# Patient Record
Sex: Male | Born: 1950 | Race: White | Hispanic: No | Smoking: Current every day smoker
Health system: Southern US, Community
[De-identification: ages and names within clinical notes are randomized; demographics above are authoritative.]

## PROBLEM LIST (undated history)

## (undated) DIAGNOSIS — C61 Malignant neoplasm of prostate: Secondary | ICD-10-CM

## (undated) DIAGNOSIS — R519 Headache, unspecified: Secondary | ICD-10-CM

## (undated) DIAGNOSIS — K219 Gastro-esophageal reflux disease without esophagitis: Secondary | ICD-10-CM

## (undated) DIAGNOSIS — I10 Essential (primary) hypertension: Secondary | ICD-10-CM

## (undated) DIAGNOSIS — F419 Anxiety disorder, unspecified: Secondary | ICD-10-CM

## (undated) DIAGNOSIS — I639 Cerebral infarction, unspecified: Secondary | ICD-10-CM

## (undated) DIAGNOSIS — R0600 Dyspnea, unspecified: Secondary | ICD-10-CM

## (undated) HISTORY — PX: PROSTATE BIOPSY: SHX241

## (undated) HISTORY — PX: EYE SURGERY: SHX253

---

## 2004-06-24 ENCOUNTER — Ambulatory Visit (HOSPITAL_COMMUNITY): Admission: RE | Admit: 2004-06-24 | Discharge: 2004-06-24 | Payer: Self-pay | Admitting: Urology

## 2004-06-24 IMAGING — CR DG THORACOLUMBAR SPINE 2V
3 series · 3 of 3 positions shown · non-contrast
Comparison: none

CLINICAL DATA: Prostate CA.  PSA 5.39.  No reported local bone pain.
RADIONUCLIDE WHOLE BODY BONE SCAN:
Whole body gamma camera static images were acquired following the IV injection of technetium [F6].
Symmetrical increase in tracer uptake at the acromioclavicular articulation is compatible with degenerative changes.  Also, there are degenerative changes of the sternoclavicular joints and probably the cervical spine, and to a slight degree the knees.  Tracer uptake in the great toe MTP regions is also compatible with degenerative changes.  There is slight increase in tracer uptake in the left aspect of L5-S1 or L4-5 compatible with spondylotic changes noted on plain films.  Minimal increased tracer uptake on the right at L4 and involving the mid-thoracic spine at approximately T8 is most likely degenerative in nature and plain films also reveal mild thoracic scoliosis.  A focus of moderate increase in tracer accumulation in the posterior aspect of the left tenth rib corresponds to a healing fracture noted on plain films.

[t t-spine/l-spine junc. ap]
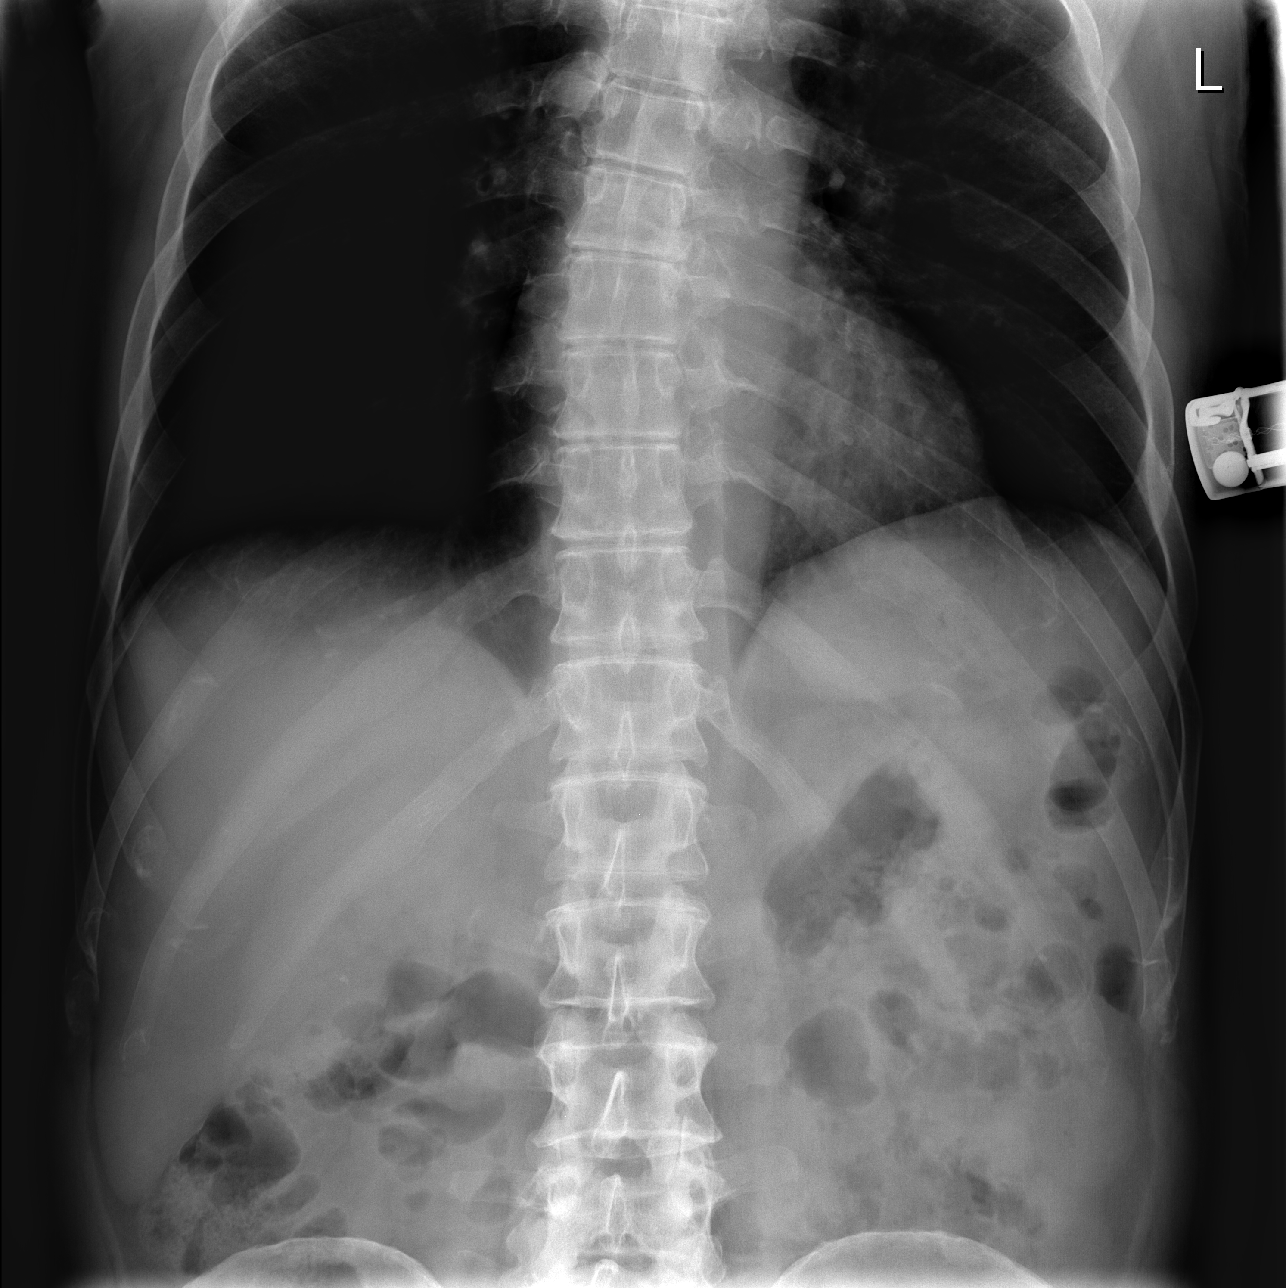

[t t-spine/l-spine junc lat (1 of 2)]
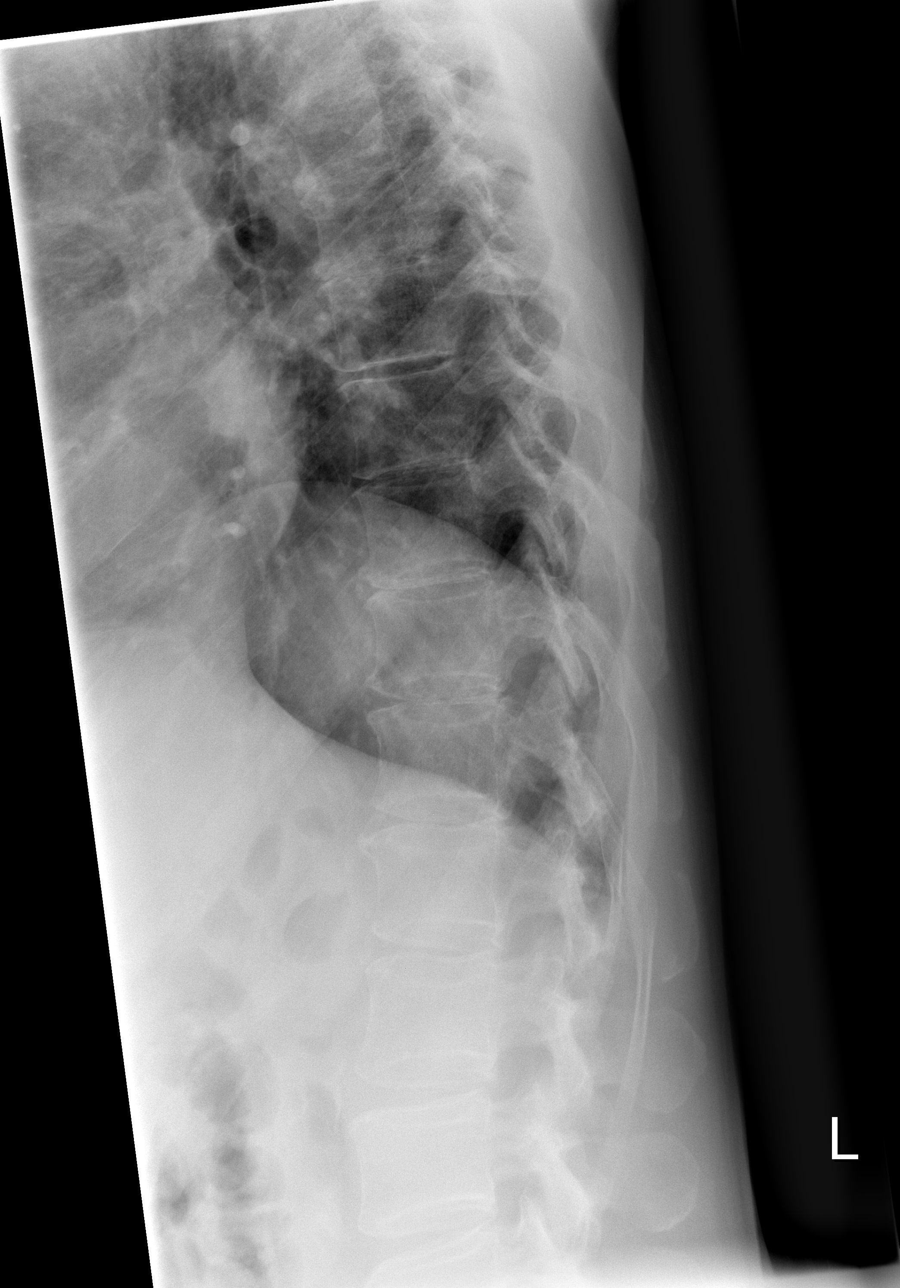

[t t-spine/l-spine junc lat (2 of 2)]
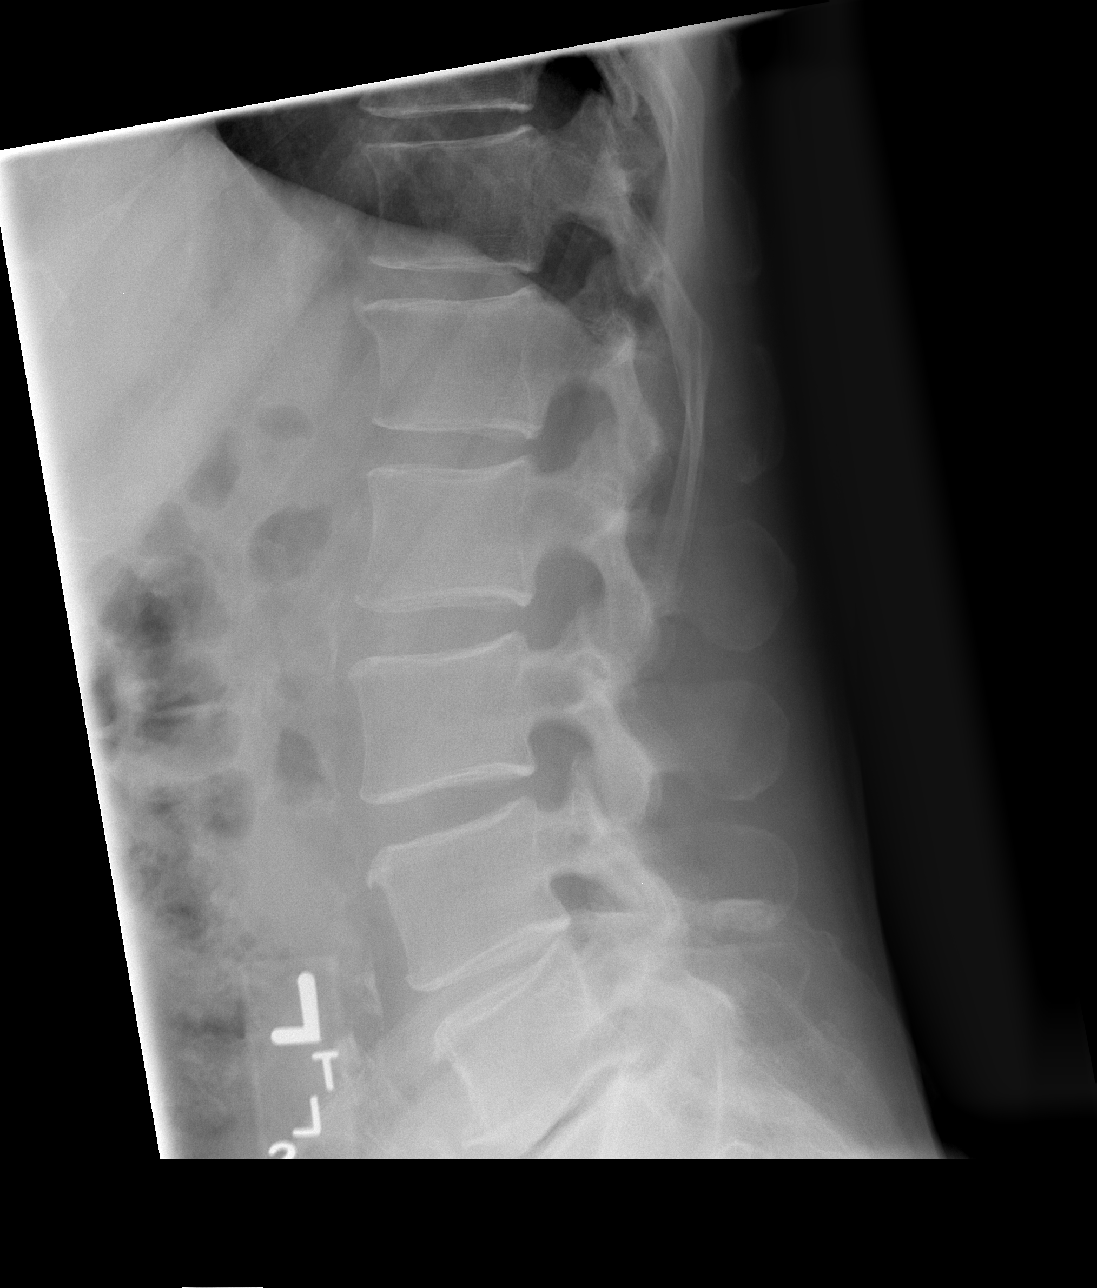

[3 of 3 positions shown; findings below may reference images not displayed]

IMPRESSION: The radionuclide bone scan is negative for metastatic disease.
Multiple foci of increased uptake I feel can be explained by benign findings, some of which are noted on the plain films.
THORACOLUMBAR SPINE ? 3 VIEWS:
Mild thoracic scoliosis convex to the left.  Disk space narrowing at T7-8 through T10-11.  Healing fracture of the left posterior tenth rib.  Advanced degenerative disk disease changes at L5-S1 with marked disk space narrowing.  Degenerative disk disease changes at L5- as well.  Minimal retrolisthesis of L4 on L5 and L5 on S1, probably degenerative in nature.
IMPRESSION: Plain films were obtained to correlate with the bone scan.  No definite evidence of metastatic disease.  See comments above.

## 2004-11-24 ENCOUNTER — Ambulatory Visit (HOSPITAL_COMMUNITY): Admission: RE | Admit: 2004-11-24 | Discharge: 2004-11-24 | Payer: Self-pay | Admitting: Urology

## 2005-07-17 IMAGING — CR DG CHEST 2V
2 series · 2 of 2 positions shown · non-contrast
Comparison: None

CLINICAL DATA: Prostate cancer.  Preop radiograph.  
 CHEST ? 2 VIEWS:

[view not recorded (1 of 2)]
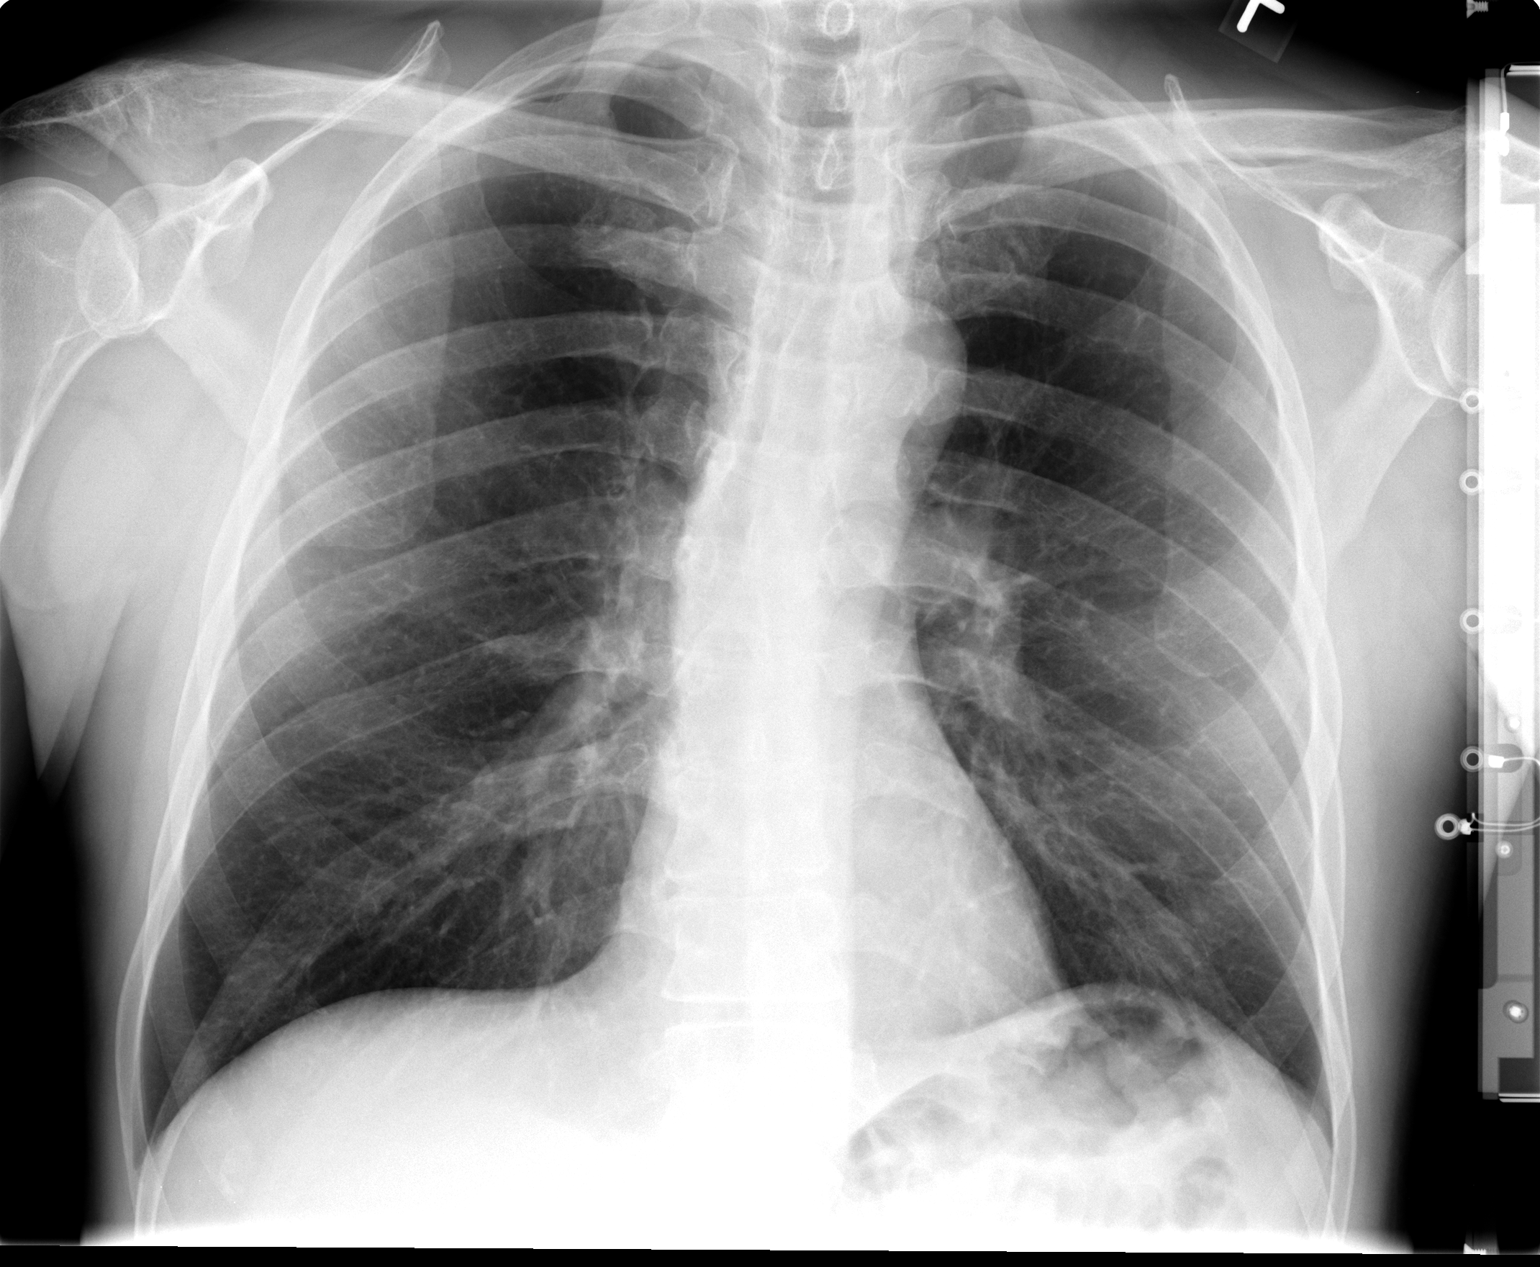

[view not recorded (2 of 2)]
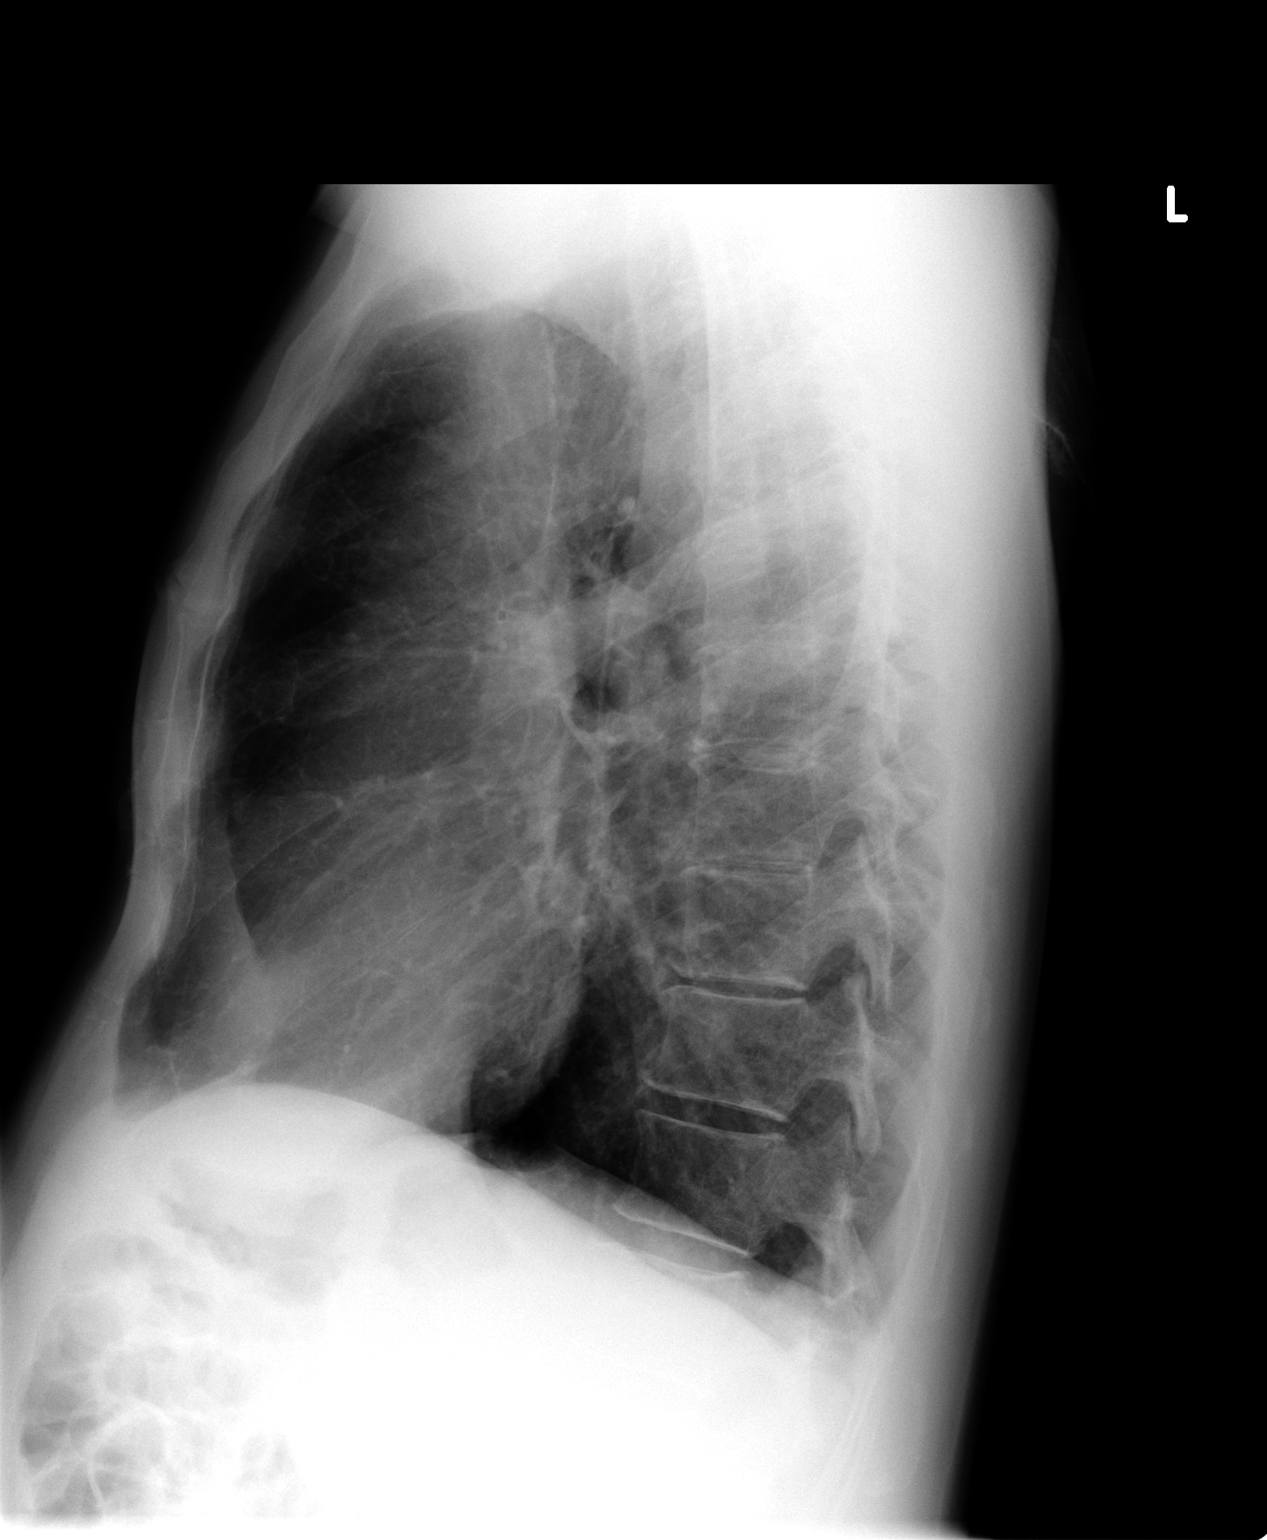

[2 of 2 positions shown; findings below may reference images not displayed]

FINDINGS: The heart size is normal.  There are no pleural effusions.  No air space opacities are noted.
IMPRESSION: No acute cardiopulmonary abnormalities.

## 2006-07-11 ENCOUNTER — Ambulatory Visit (HOSPITAL_COMMUNITY): Admission: RE | Admit: 2006-07-11 | Discharge: 2006-07-11 | Payer: Self-pay | Admitting: Urology

## 2006-07-11 IMAGING — CR DG CHEST 2V
3 series · 3 of 3 positions shown · non-contrast
Comparison: [DATE]

CLINICAL DATA: 55-year-old, left breast enlargement.  Chronic smoker. 
 CHEST - 2 VIEW:

[view not recorded (1 of 3)]
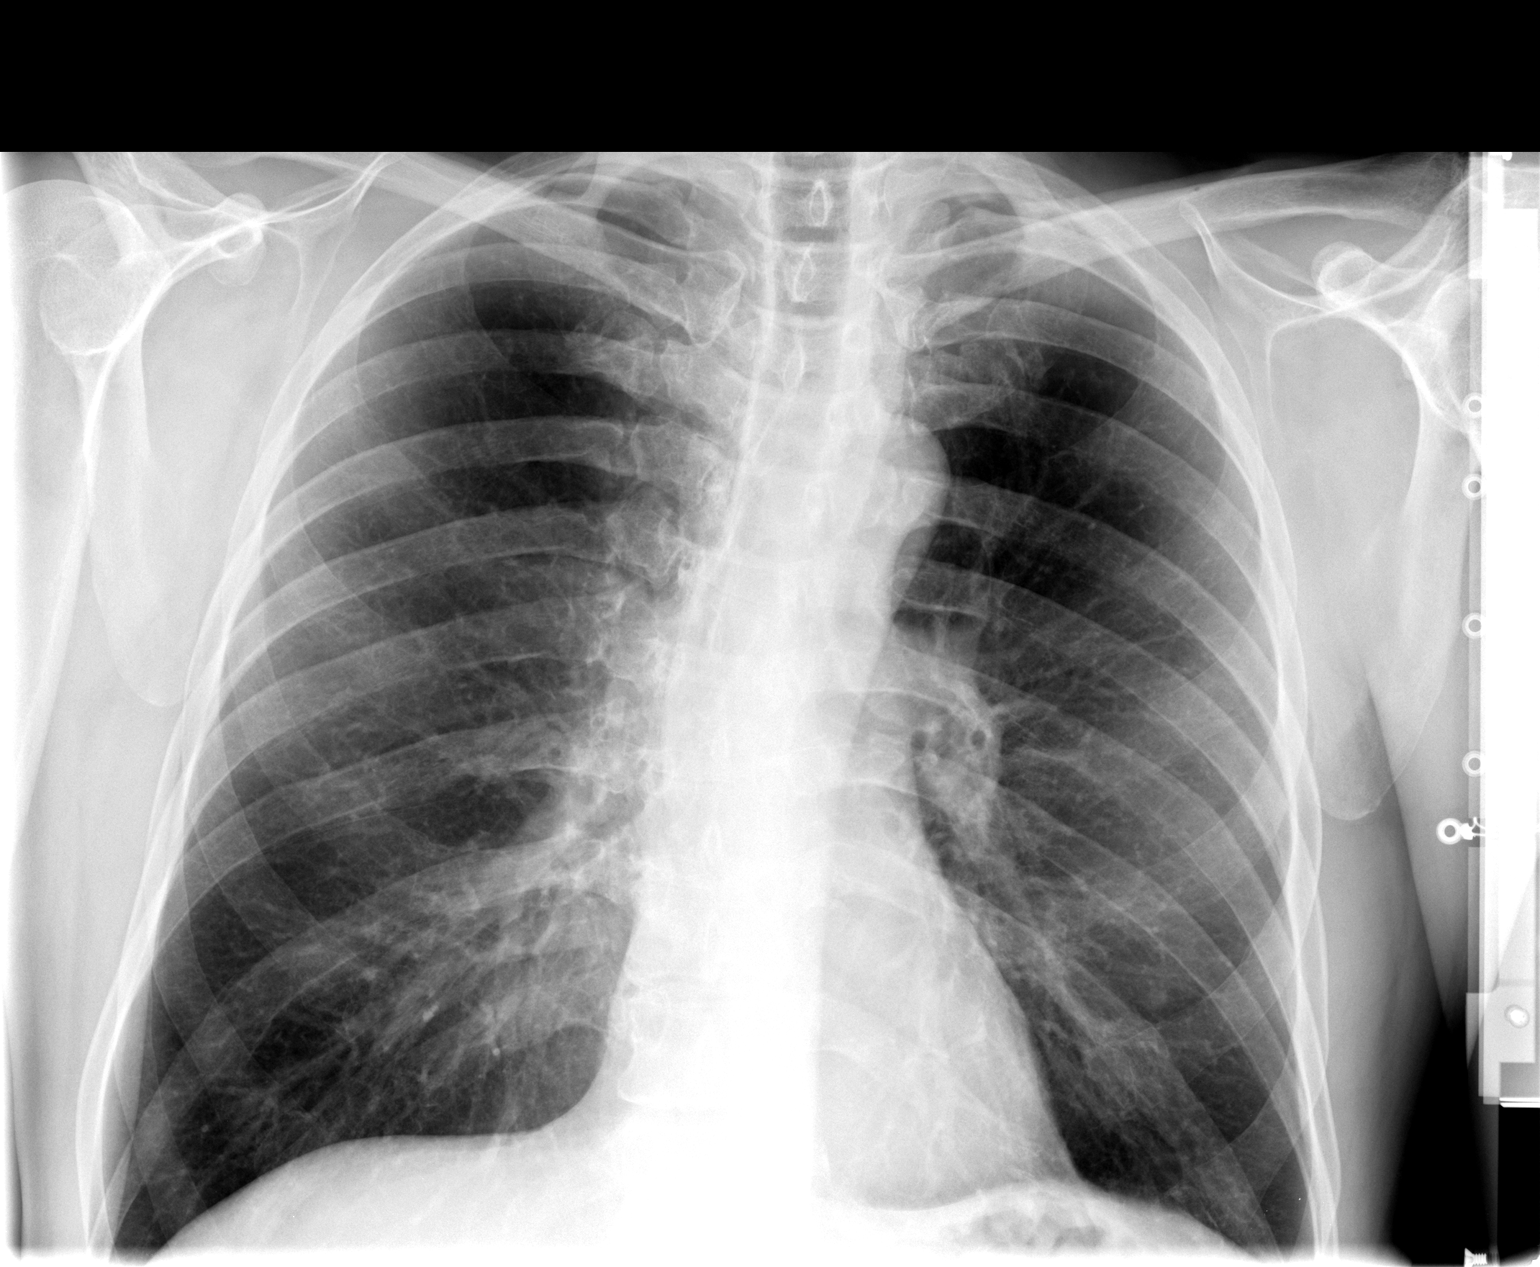

[view not recorded (2 of 3)]
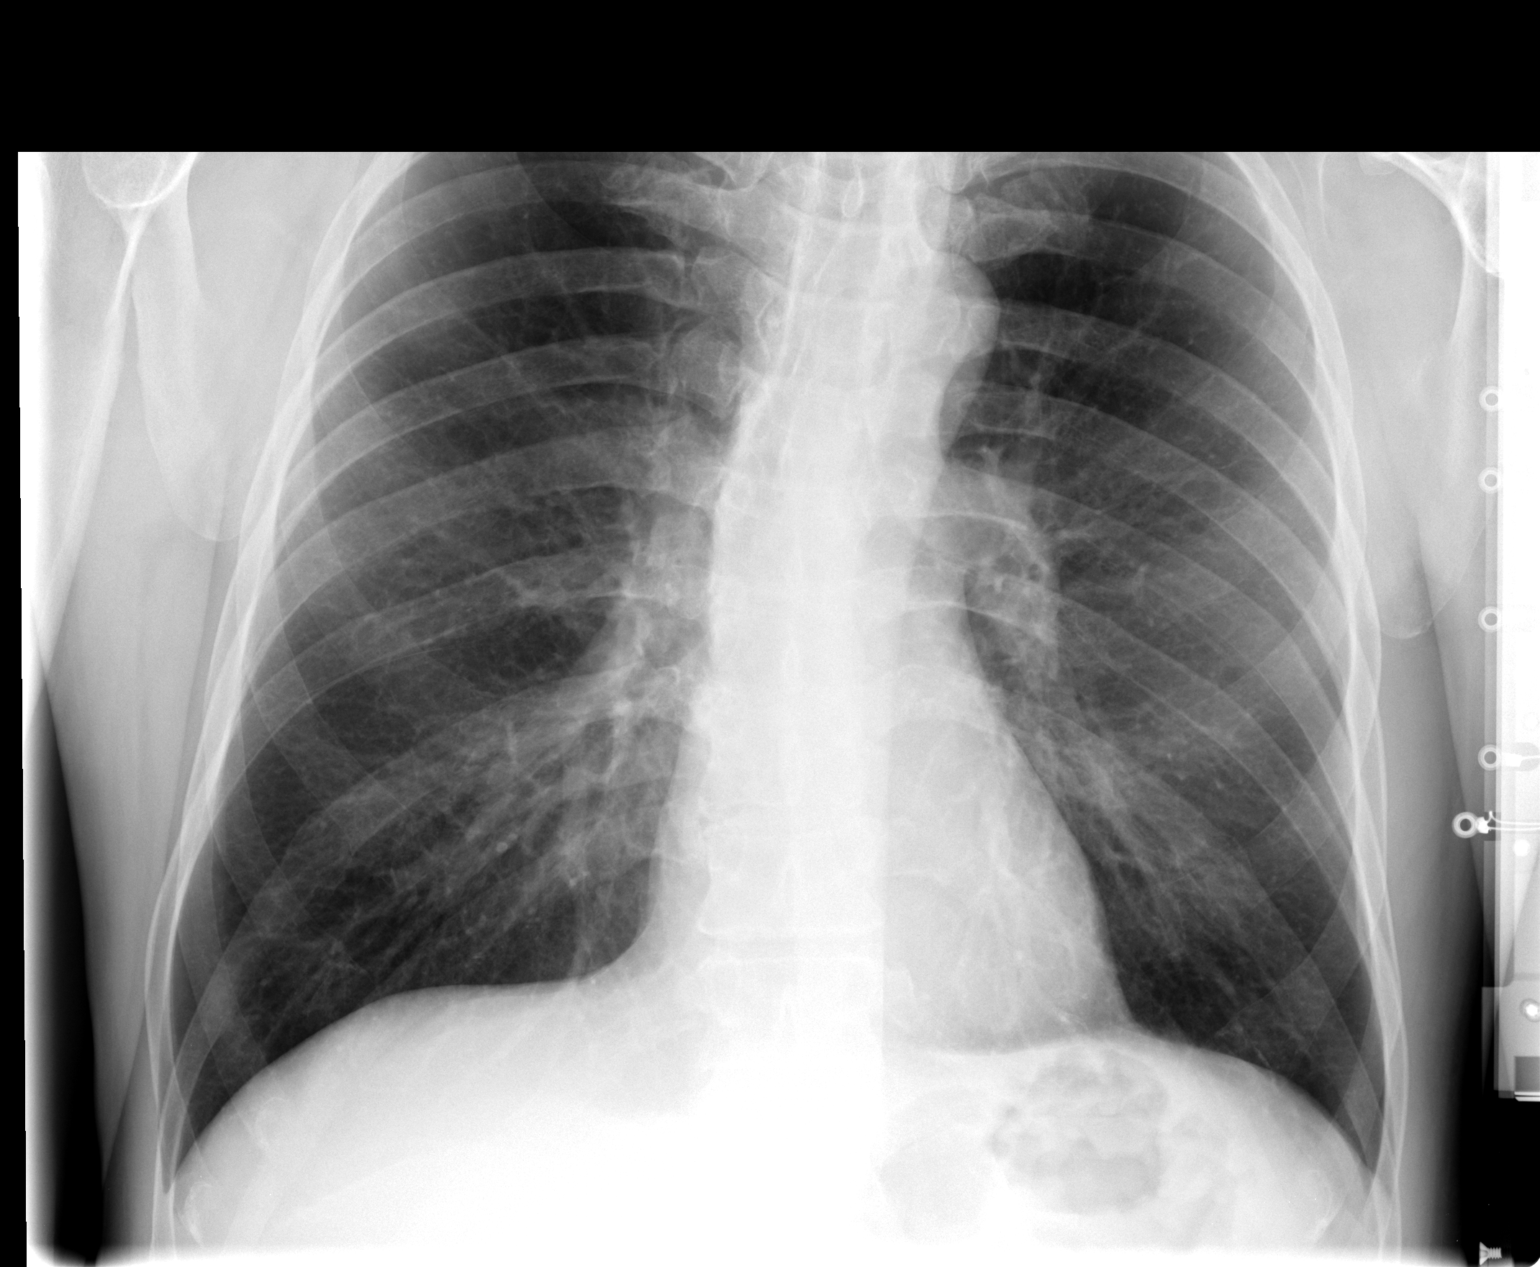

[view not recorded (3 of 3)]
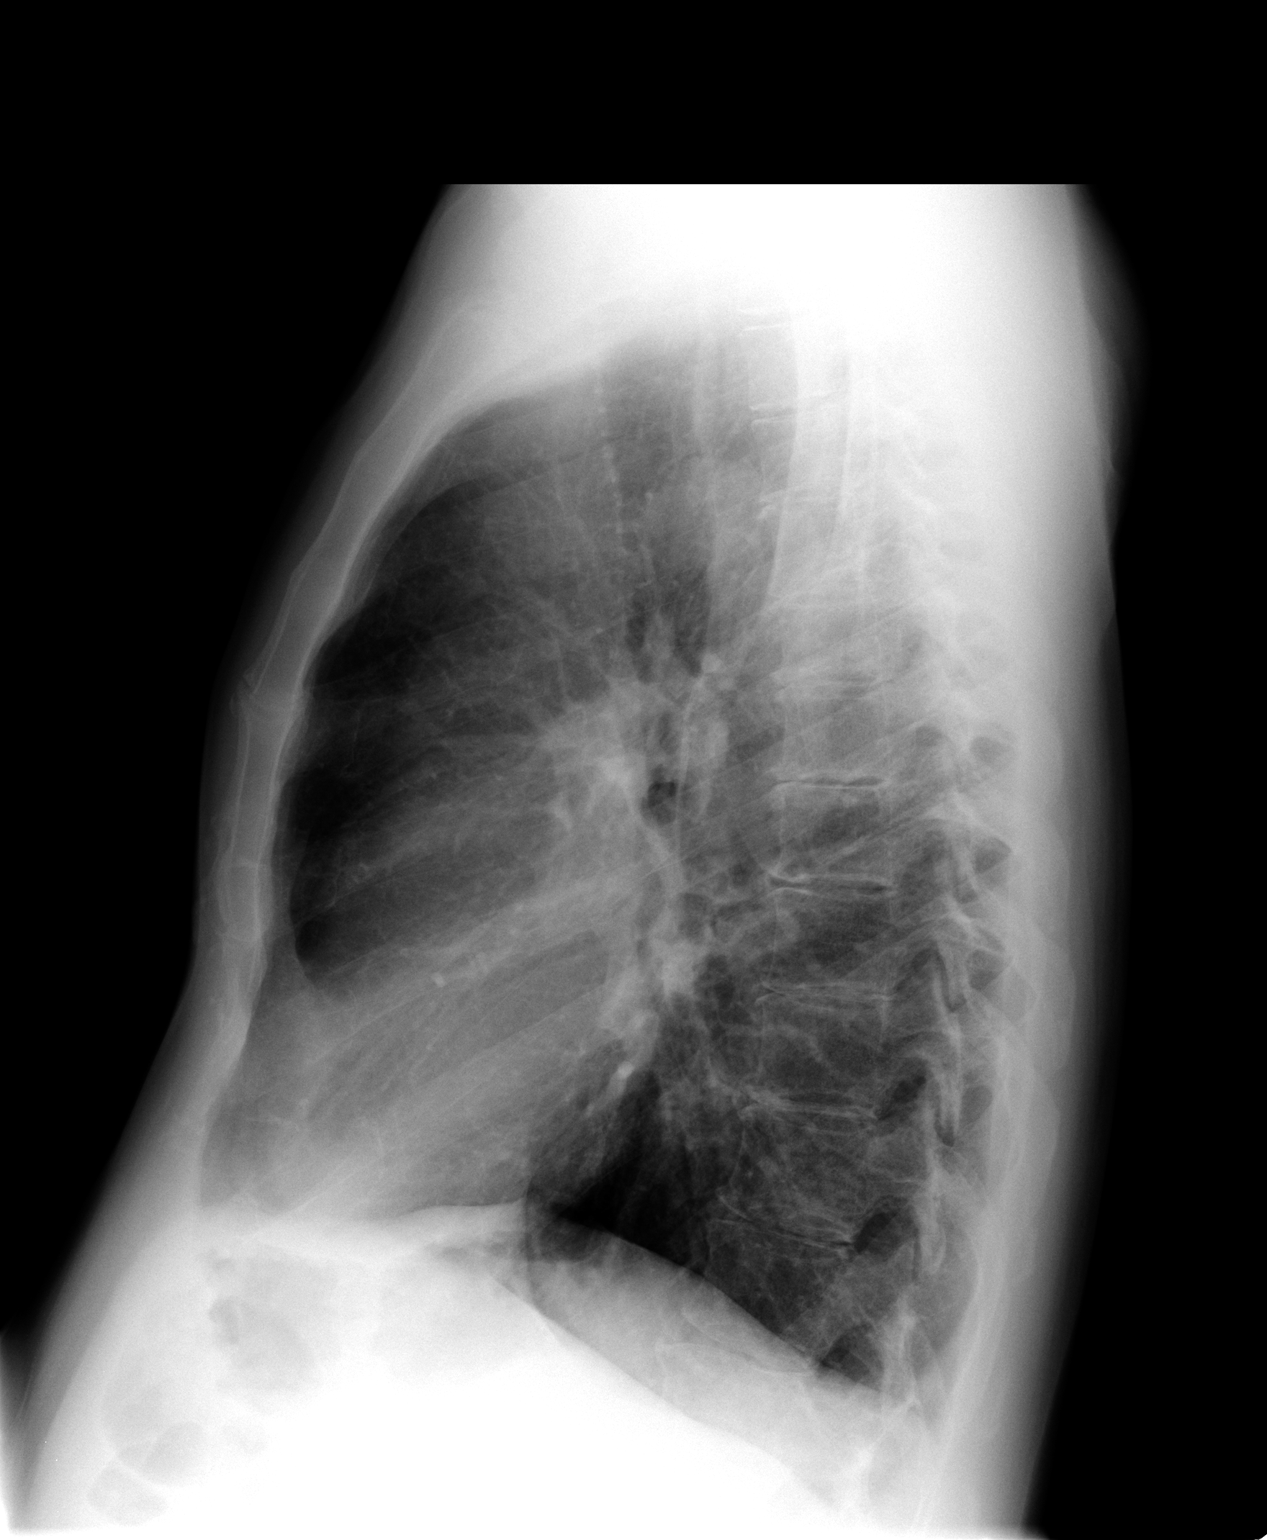

[3 of 3 positions shown; findings below may reference images not displayed]

FINDINGS: Cardiac silhouette, mediastinal and hilar contours are within normal limits and stable.  Lungs are clear.  Stable mild scoliotic curvature.
IMPRESSION: No acute cardiopulmonary findings.  Stable appearance of the chest since [DATE].

## 2013-12-12 ENCOUNTER — Inpatient Hospital Stay (HOSPITAL_COMMUNITY)
Admission: EM | Admit: 2013-12-12 | Discharge: 2013-12-14 | DRG: 194 | Disposition: A | Discharge status: Home / Self Care | Payer: Self-pay | Attending: Internal Medicine | Admitting: Internal Medicine

## 2013-12-12 ENCOUNTER — Emergency Department (HOSPITAL_COMMUNITY): Payer: Self-pay

## 2013-12-12 ENCOUNTER — Encounter (HOSPITAL_COMMUNITY): Payer: Self-pay | Admitting: Emergency Medicine

## 2013-12-12 DIAGNOSIS — R079 Chest pain, unspecified: Secondary | ICD-10-CM | POA: Diagnosis present

## 2013-12-12 DIAGNOSIS — F17210 Nicotine dependence, cigarettes, uncomplicated: Secondary | ICD-10-CM | POA: Diagnosis present

## 2013-12-12 DIAGNOSIS — R9431 Abnormal electrocardiogram [ECG] [EKG]: Secondary | ICD-10-CM

## 2013-12-12 DIAGNOSIS — Z8546 Personal history of malignant neoplasm of prostate: Secondary | ICD-10-CM

## 2013-12-12 DIAGNOSIS — E871 Hypo-osmolality and hyponatremia: Secondary | ICD-10-CM | POA: Diagnosis present

## 2013-12-12 DIAGNOSIS — I421 Obstructive hypertrophic cardiomyopathy: Secondary | ICD-10-CM | POA: Diagnosis present

## 2013-12-12 DIAGNOSIS — Z720 Tobacco use: Secondary | ICD-10-CM

## 2013-12-12 DIAGNOSIS — J189 Pneumonia, unspecified organism: Principal | ICD-10-CM | POA: Diagnosis present

## 2013-12-12 DIAGNOSIS — I422 Other hypertrophic cardiomyopathy: Secondary | ICD-10-CM

## 2013-12-12 DIAGNOSIS — Z801 Family history of malignant neoplasm of trachea, bronchus and lung: Secondary | ICD-10-CM

## 2013-12-12 DIAGNOSIS — R011 Cardiac murmur, unspecified: Secondary | ICD-10-CM | POA: Diagnosis present

## 2013-12-12 DIAGNOSIS — Z8249 Family history of ischemic heart disease and other diseases of the circulatory system: Secondary | ICD-10-CM

## 2013-12-12 DIAGNOSIS — E876 Hypokalemia: Secondary | ICD-10-CM | POA: Diagnosis present

## 2013-12-12 HISTORY — DX: Malignant neoplasm of prostate: C61

## 2013-12-12 LAB — CBC WITH DIFFERENTIAL/PLATELET
Basophils Absolute: 0 10*3/uL (ref 0.0–0.1)
Basophils Relative: 0 % (ref 0–1)
Eosinophils Absolute: 0.1 10*3/uL (ref 0.0–0.7)
Eosinophils Relative: 1 % (ref 0–5)
HCT: 41.1 % (ref 39.0–52.0)
HEMOGLOBIN: 13.6 g/dL (ref 13.0–17.0)
LYMPHS PCT: 14 % (ref 12–46)
Lymphs Abs: 1.8 10*3/uL (ref 0.7–4.0)
MCH: 29.4 pg (ref 26.0–34.0)
MCHC: 33.1 g/dL (ref 30.0–36.0)
MCV: 89 fL (ref 78.0–100.0)
MONO ABS: 0.8 10*3/uL (ref 0.1–1.0)
Monocytes Relative: 6 % (ref 3–12)
NEUTROS ABS: 10.6 10*3/uL — AB (ref 1.7–7.7)
NEUTROS PCT: 79 % — AB (ref 43–77)
PLATELETS: 275 10*3/uL (ref 150–400)
RBC: 4.62 MIL/uL (ref 4.22–5.81)
RDW: 13.7 % (ref 11.5–15.5)
WBC: 13.4 10*3/uL — ABNORMAL HIGH (ref 4.0–10.5)

## 2013-12-12 LAB — BASIC METABOLIC PANEL
ANION GAP: 18 — AB (ref 5–15)
BUN: 10 mg/dL (ref 6–23)
CALCIUM: 8.7 mg/dL (ref 8.4–10.5)
CO2: 18 mEq/L — ABNORMAL LOW (ref 19–32)
CREATININE: 0.47 mg/dL — AB (ref 0.50–1.35)
Chloride: 96 mEq/L (ref 96–112)
GLUCOSE: 133 mg/dL — AB (ref 70–99)
Potassium: 3.1 mEq/L — ABNORMAL LOW (ref 3.7–5.3)
Sodium: 132 mEq/L — ABNORMAL LOW (ref 137–147)

## 2013-12-12 LAB — I-STAT TROPONIN, ED: TROPONIN I, POC: 0.01 ng/mL (ref 0.00–0.08)

## 2013-12-12 IMAGING — CR DG CHEST 2V
2 series · 2 of 2 positions shown · non-contrast
Comparison: [DATE]

CLINICAL DATA: Chest pain since [REDACTED].  Smoker.

EXAM:
CHEST  2 VIEW

[w chest lat]
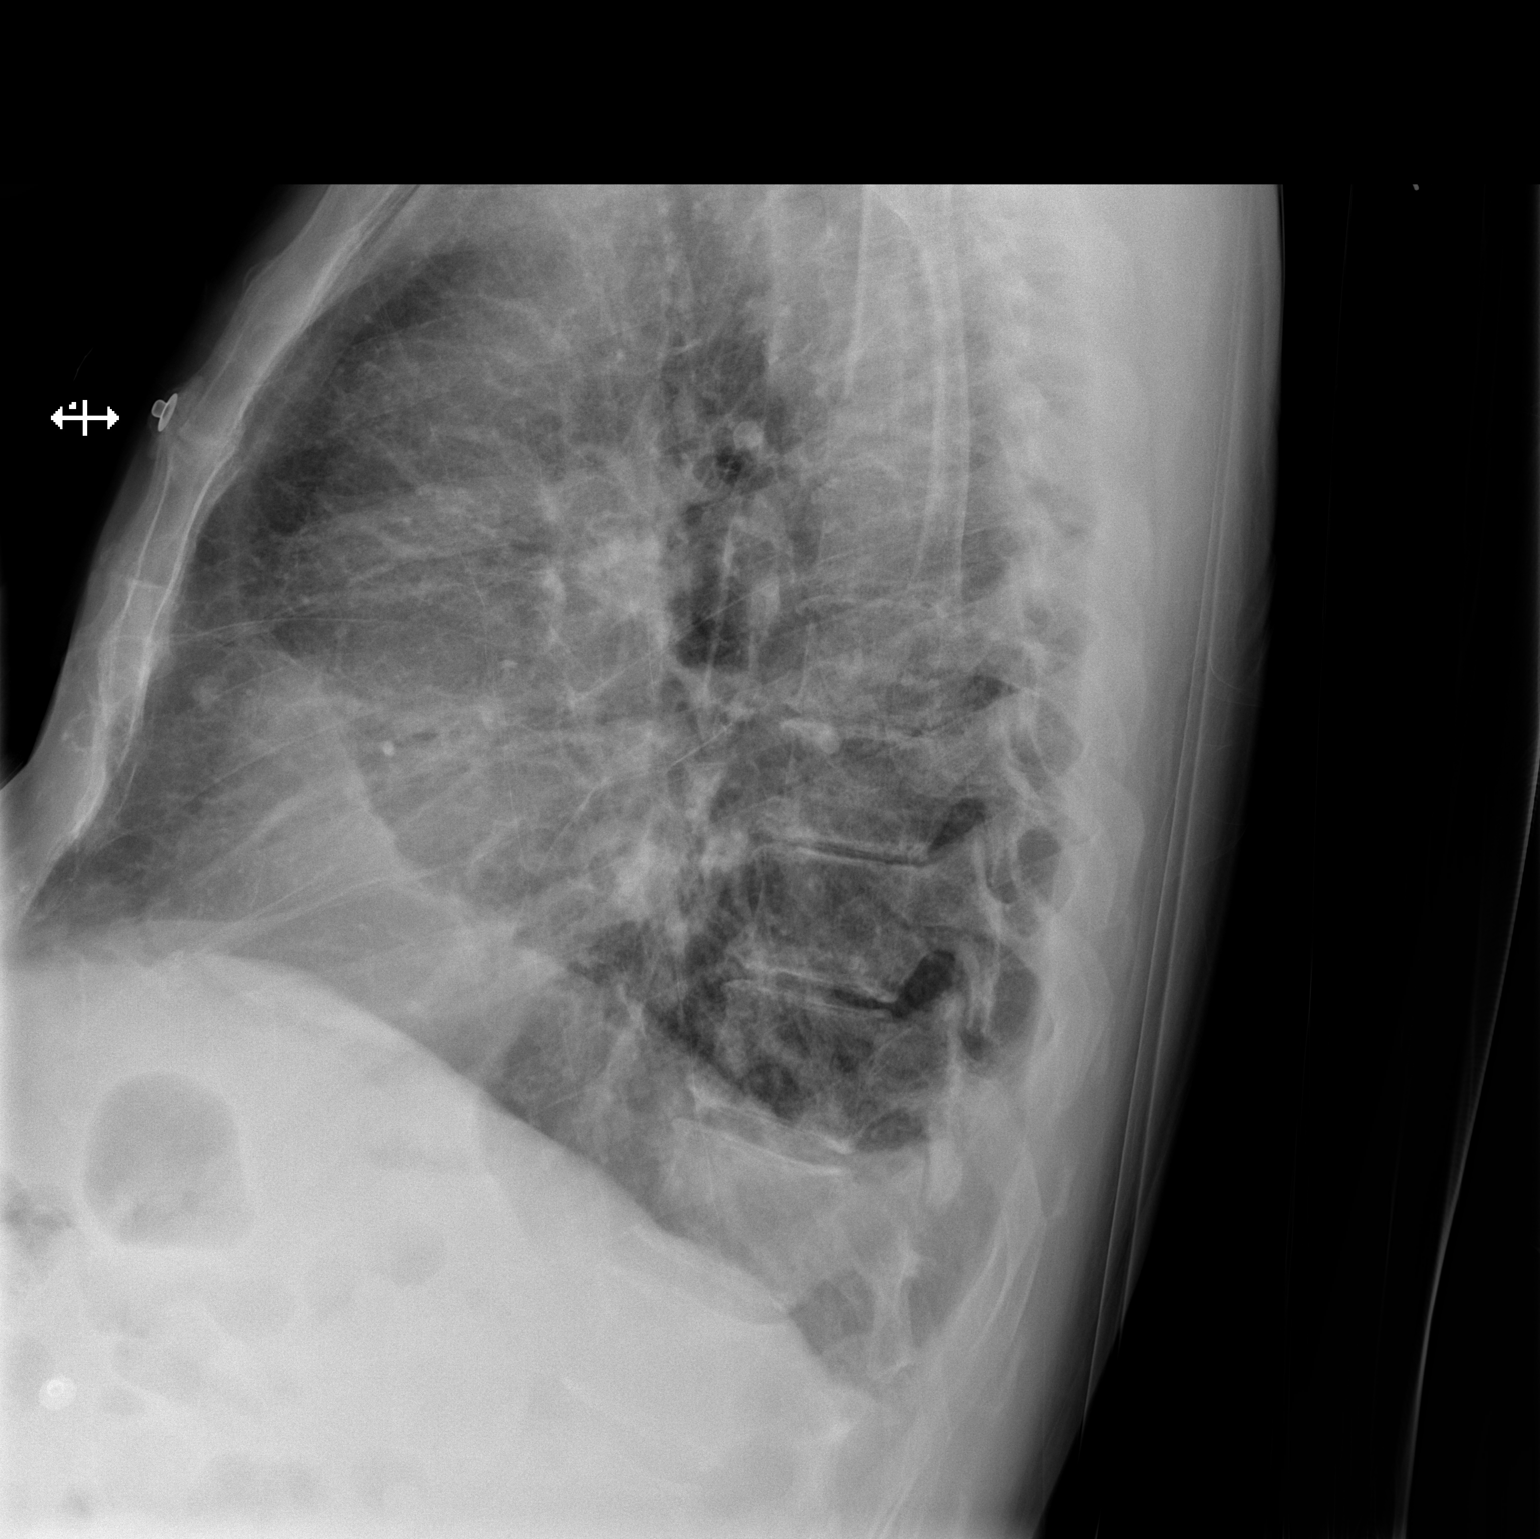

[x chest ap]
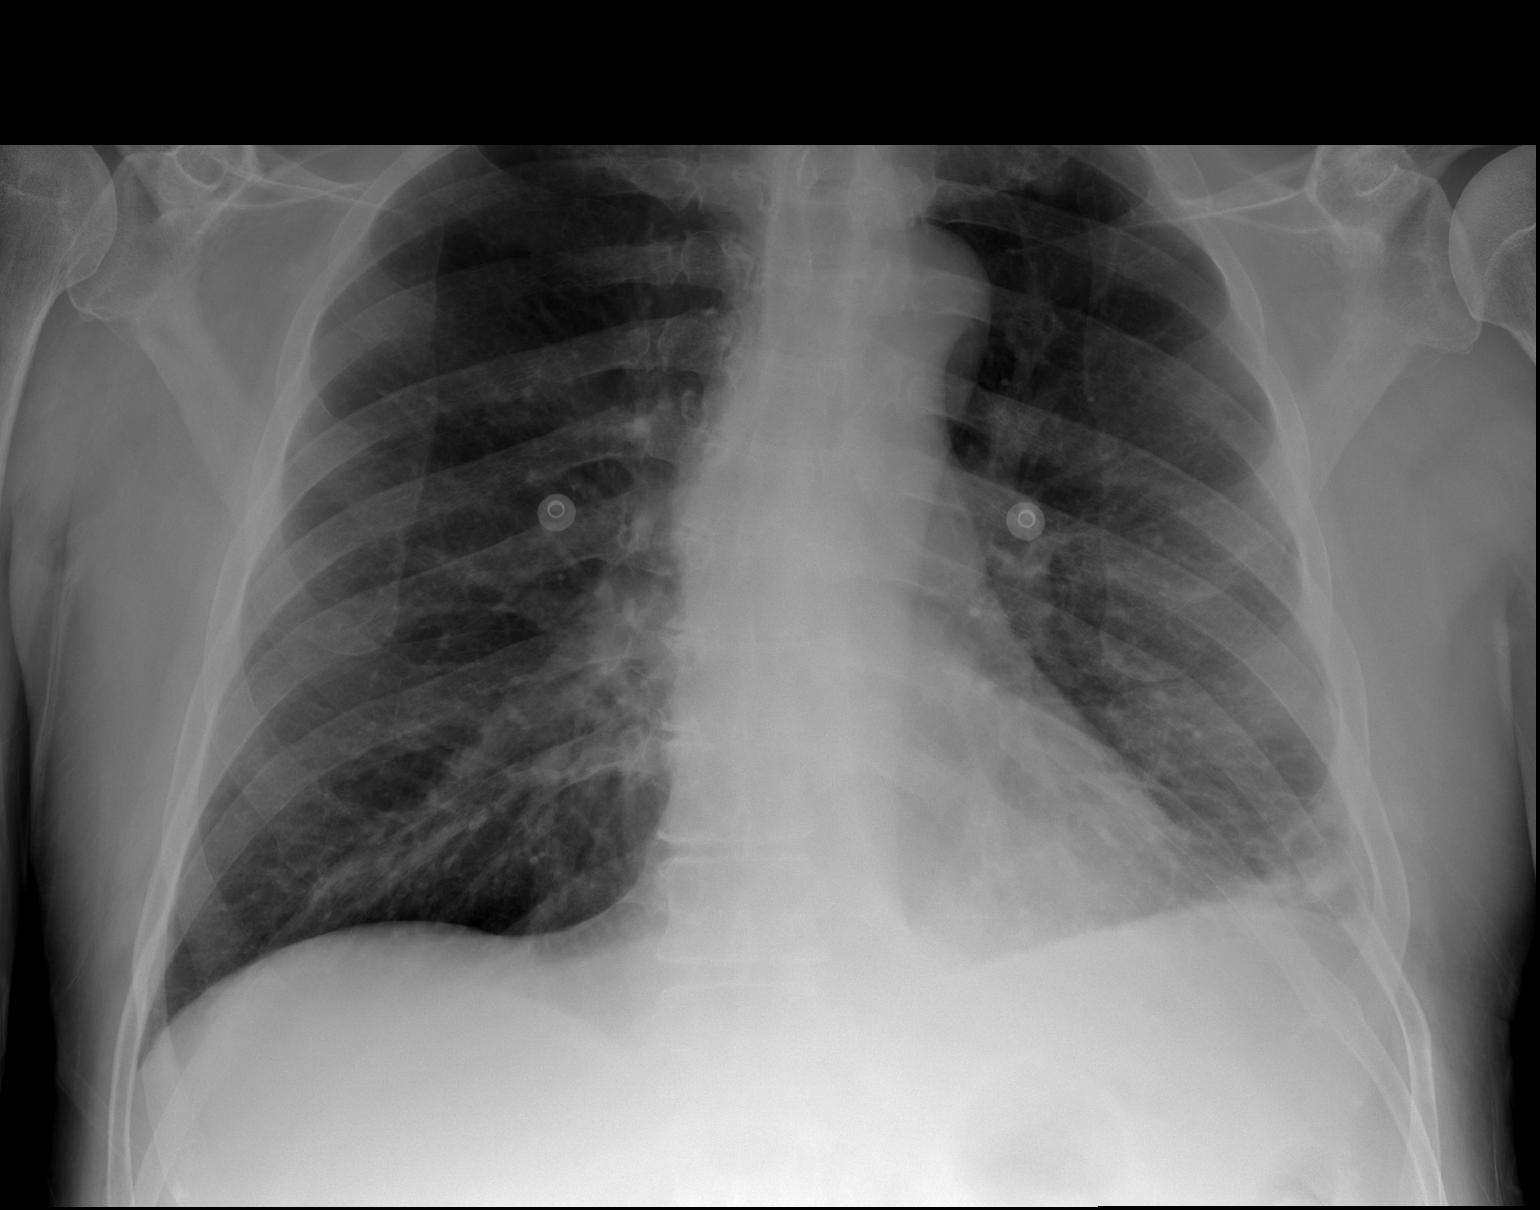

[2 of 2 positions shown; findings below may reference images not displayed]

FINDINGS: There is infiltration in the left lung base with blunting of left
costophrenic angle suggesting effusion. Findings likely represent
pneumonia. Right lung is clear and expanded. Normal heart size and
pulmonary vascularity. No pneumothorax.
IMPRESSION: Infiltration and effusion in the left lung base likely representing
pneumonia.

## 2013-12-12 MED ORDER — NITROGLYCERIN 2 % TD OINT
1.0000 [in_us] | TOPICAL_OINTMENT | Freq: Once | TRANSDERMAL | Status: AC
Start: 2013-12-12 — End: 2013-12-12
  Administered 2013-12-12: 1 [in_us] via TOPICAL
  Filled 2013-12-12: qty 1

## 2013-12-12 MED ORDER — MORPHINE SULFATE 4 MG/ML IJ SOLN
4.0000 mg | Freq: Once | INTRAMUSCULAR | Status: AC
  Administered 2013-12-12: 4 mg via INTRAVENOUS
  Filled 2013-12-12: qty 1

## 2013-12-12 NOTE — ED Notes (Signed)
MD at bedside. 

## 2013-12-12 NOTE — ED Provider Notes (Signed)
CSN: [id number]     Arrival date & time 12/12/13  2154 History   First MD Initiated Contact with Patient 12/12/13 2158     Chief Complaint  Patient presents with  . Chest Pain   Patient is a 63 y.o. male presenting with chest pain. The history is provided by the patient.  Chest Pain Pain location:  L chest and substernal area Pain radiates to:  Mid back Pain radiates to the back: yes   Onset quality:  Sudden Timing:  Intermittent Progression:  Unchanged Chronicity:  New Relieved by:  Rest Exacerbated by: position. Associated symptoms: no abdominal pain, no anxiety, no back pain, no cough, no fever, no headache, no lower extremity edema, no nausea, no shortness of breath, no syncope and not vomiting   Risk factors: smoking    Patient is a  64 year old Caucasian male with a history of tobacco abuse who presents with chest pain. Patient states that he has had pain off and on for the last 3 days. The pain is substernal radiating to his back and neck. He denies left arm pain or numbness. He denies shortness of breath, abdominal pain, cough, fevers, chills, night sweats. The patient's pain began this evening while he was at rest. Patient has been taking BC powders without relief. EMS offered to give aspirin but the patient was not given this because he had been on BC powders.  Past Medical History  Diagnosis Date  . Prostate cancer    Past Surgical History  Procedure Laterality Date  . Eye surgery     Family History  Problem Relation Age of Onset  . Cancer - Lung Father   . Heart disease Brother   . Diabetes type II Neg Hx   . Stroke Neg Hx    History  Substance Use Topics  . Smoking status: Current Every Day Smoker    Types: Cigarettes  . Smokeless tobacco: Not on file  . Alcohol Use: Yes     Comment: couple of days a week    Review of Systems  Constitutional: Negative for fever and chills.  HENT: Negative for rhinorrhea and sore throat.   Eyes: Negative for visual  disturbance.  Respiratory: Negative for cough and shortness of breath.   Cardiovascular: Positive for chest pain. Negative for syncope.  Gastrointestinal: Negative for nausea, vomiting, abdominal pain, diarrhea and constipation.  Genitourinary: Negative for dysuria and hematuria.  Musculoskeletal: Negative for back pain and neck pain.  Skin: Negative for rash.  Neurological: Negative for syncope and headaches.  Psychiatric/Behavioral: Negative for confusion.  All other systems reviewed and are negative.  Allergies  Review of patient's allergies indicates no known allergies.  Home Medications   Prior to Admission medications   Medication Sig Start Date End Date Taking? Authorizing Provider  Aspirin-Salicylamide-Caffeine (BC HEADACHE POWDER PO) Take 1 Package by mouth every 6 (six) hours as needed (pain).   Yes Historical Provider, MD  Multiple Vitamins-Minerals (MULTIVITAMIN WITH MINERALS) tablet Take 1 tablet by mouth daily.   Yes Historical Provider, MD  omeprazole (PRILOSEC) 20 MG capsule Take 20 mg by mouth daily.   Yes Historical Provider, MD   BP 118/79  Pulse 87  Temp(Src) 99.5 F (37.5 C) (Oral)  Resp 15  Ht 5\' 11"  (1.803 m)  Wt 180 lb (81.647 kg)  BMI 25.12 kg/m2  SpO2 95% Physical Exam  Constitutional: He is oriented to person, place, and time. He appears well-developed and well-nourished. No distress.  HENT:  Head: Normocephalic and  atraumatic.  Mouth/Throat: Oropharynx is clear and moist.  Eyes: EOM are normal.  Neck: Neck supple. No JVD present.  Cardiovascular: Normal rate, regular rhythm, normal heart sounds and intact distal pulses.   Pulmonary/Chest: Effort normal and breath sounds normal. He exhibits tenderness (left chest wall).  Abdominal: Soft. He exhibits no distension. There is no tenderness.  Musculoskeletal: Normal range of motion. He exhibits no edema.  Neurological: He is alert and oriented to person, place, and time. No cranial nerve deficit.   Skin: Skin is warm and dry.  Psychiatric: His behavior is normal.   ED Course  Procedures  None   Labs Review Labs Reviewed  CBC WITH DIFFERENTIAL - Abnormal; Notable for the following:    WBC 13.4 (*)    Neutrophils Relative % 79 (*)    Neutro Abs 10.6 (*)    All other components within normal limits  BASIC METABOLIC PANEL - Abnormal; Notable for the following:    Sodium 132 (*)    Potassium 3.1 (*)    CO2 18 (*)    Glucose, Bld 133 (*)    Creatinine, Ser 0.47 (*)    Anion gap 18 (*)    All other components within normal limits  I-STAT TROPOININ, ED    Imaging Review Dg Chest 2 View  12/13/2013   CLINICAL DATA:  Chest pain since Wednesday.  Smoker.  EXAM: CHEST  2 VIEW  COMPARISON:  07/11/2006  FINDINGS: There is infiltration in the left lung base with blunting of left costophrenic angle suggesting effusion. Findings likely represent pneumonia. Right lung is clear and expanded. Normal heart size and pulmonary vascularity. No pneumothorax.  IMPRESSION: Infiltration and effusion in the left lung base likely representing pneumonia.   Electronically Signed   By: Lucienne Capers M.D.   On: 12/13/2013 00:13   MDM   Final diagnoses:  Chest pain, unspecified chest pain type   63 yo M smoker with no other prior history presents with chest pain. Well appearing on exam. NTG paste placed for pain 1/10. Patient reported improvement with this. CXR obtained concerning for LLL PNA. EKG does not clearly demonstrate STEMI. QTc 475, TWI in V1 and aVR. Will admit patient for ACS rule out and CAP. Pain recurred so morphine given. Patient stable prior to transfer to floor.   Case discussed with Dr. Mingo Amber.  Gustavus Bryant, MD    Gustavus Bryant, MD 12/13/13 [phone]

## 2013-12-12 NOTE — H&P (Addendum)
PCP: none   Chief Complaint:  Chest pain  HPI: Cody Douglas is a 63 y.o. male   has a past medical history of Prostate cancer.   Presented with  2 days ago patient started to have chest pain, pain is positional worse with laying flat and improves with sitting. Worse with deep breaths. Patient denies any fever or chills, no recent illness. Patient continued to smoke 2.5 packs a day. No hx of CAD. Father dies with presumed lung Cancer. No prior CAD. ECG worrisome for pericarditis.   Hospitalist was called for admission for chest pain worrisome for pericarditis.   Review of Systems:    Pertinent positives include:  chest pain, shortness of breath at rest.   Constitutional:  No weight loss, night sweats, Fevers, chills, fatigue, weight loss  HEENT:  No headaches, Difficulty swallowing,Tooth/dental problems,Sore throat,  No sneezing, itching, ear ache, nasal congestion, post nasal drip,  Cardio-vascular:  No  Orthopnea, PND, anasarca, dizziness, palpitations.no Bilateral lower extremity swelling  GI:  No heartburn, indigestion, abdominal pain, nausea, vomiting, diarrhea, change in bowel habits, loss of appetite, melena, blood in stool, hematemesis Resp:  no No dyspnea on exertion, No excess mucus, no productive cough, No non-productive cough, No coughing up of blood.No change in color of mucus.No wheezing. Skin:  no rash or lesions. No jaundice GU:  no dysuria, change in color of urine, no urgency or frequency. No straining to urinate.  No flank pain.  Musculoskeletal:  No joint pain or no joint swelling. No decreased range of motion. No back pain.  Psych:  No change in mood or affect. No depression or anxiety. No memory loss.  Neuro: no localizing neurological complaints, no tingling, no weakness, no double vision, no gait abnormality, no slurred speech, no confusion  Otherwise ROS are negative except for above, 10 systems were reviewed  Past Medical History: Past  Medical History  Diagnosis Date  . Prostate cancer    Past Surgical History  Procedure Laterality Date  . Eye surgery       Medications: Prior to Admission medications   Medication Sig Start Date End Date Taking? Authorizing Provider  Aspirin-Salicylamide-Caffeine (BC HEADACHE POWDER PO) Take 1 Package by mouth every 6 (six) hours as needed (pain).   Yes Historical Provider, MD  Multiple Vitamins-Minerals (MULTIVITAMIN WITH MINERALS) tablet Take 1 tablet by mouth daily.   Yes Historical Provider, MD  omeprazole (PRILOSEC) 20 MG capsule Take 20 mg by mouth daily.   Yes Historical Provider, MD    Allergies:  No Known Allergies  Social History:  Ambulatory independently   Lives at home With family     reports that he has been smoking Cigarettes.  He has been smoking about 0.00 packs per day. He does not have any smokeless tobacco history on file. He reports that he drinks alcohol. He reports that he does not use illicit drugs.    Family History: family history includes Cancer - Lung in his father; Heart disease in his brother. There is no history of Diabetes type II or Stroke.    Physical Exam: Patient Vitals for the past 24 hrs:  BP Temp Temp src Pulse Resp SpO2 Height Weight  12/12/13 2310 118/79 mmHg - - 87 15 95 % - -  12/12/13 2230 117/78 mmHg - - 92 17 96 % - -  12/12/13 2215 108/70 mmHg - - 92 14 95 % - -  12/12/13 2158 118/82 mmHg 99.5 F (37.5 C) Oral 93 24  93 % 5\' 11"  (1.803 m) 81.647 kg (180 lb)  12/12/13 2154 - - - - - 96 % - -    1. General:  in No Acute distress 2. Psychological: Alert and  Oriented 3. Head/ENT:   Moist   Mucous Membranes                          Head Non traumatic, neck supple                            Poor Dentition 4. SKIN:   decreased Skin turgor,  Skin clean Dry and intact no rash 5. Heart: Regular rate and rhythm systolic Murmur no Rub or gallop 6. Lungs: no wheezes some crackles , decreased BS at left base 7. Abdomen: Soft,  non-tender, Non distended 8. Lower extremities: no clubbing, cyanosis, or edema 9. Neurologically Grossly intact, moving all 4 extremities equally 10. MSK: Normal range of motion, chest wall tenderness  body mass index is 25.12 kg/(m^2).   Labs on Admission:   Recent Labs  12/12/13 2209  NA 132*  K 3.1*  CL 96  CO2 18*  GLUCOSE 133*  BUN 10  CREATININE 0.47*  CALCIUM 8.7   No results found for this basename: AST, ALT, ALKPHOS, BILITOT, PROT, ALBUMIN,  in the last 72 hours No results found for this basename: LIPASE, AMYLASE,  in the last 72 hours  Recent Labs  12/12/13 2209  WBC 13.4*  NEUTROABS 10.6*  HGB 13.6  HCT 41.1  MCV 89.0  PLT 275   No results found for this basename: CKTOTAL, CKMB, CKMBINDEX, TROPONINI,  in the last 72 hours No results found for this basename: TSH, T4TOTAL, FREET3, T3FREE, THYROIDAB,  in the last 72 hours No results found for this basename: VITAMINB12, FOLATE, FERRITIN, TIBC, IRON, RETICCTPCT,  in the last 72 hours No results found for this basename: HGBA1C    Estimated Creatinine Clearance: 99.4 ml/min (by C-G formula based on Cr of 0.47). ABG No results found for this basename: phart, pco2, po2, hco3, tco2, acidbasedef, o2sat     No results found for this basename: DDIMER     Other results:  I have pearsonaly reviewed this: ECG REPORT  Rate: 86  Rhythm: SR ST&T Change: atypical ST elevations in multiple leads, questionable pericarditis BNP (last 3 results) No results found for this basename: PROBNP,  in the last 8760 hours  Filed Weights   12/12/13 2158  Weight: 81.647 kg (180 lb)    Cultures: No results found for this basename: sdes, specrequest, cult, reptstatus     Radiological Exams on Admission: Dg Chest 2 View  12/13/2013   CLINICAL DATA:  Chest pain since Wednesday.  Smoker.  EXAM: CHEST  2 VIEW  COMPARISON:  07/11/2006  FINDINGS: There is infiltration in the left lung base with blunting of left costophrenic angle  suggesting effusion. Findings likely represent pneumonia. Right lung is clear and expanded. Normal heart size and pulmonary vascularity. No pneumothorax.  IMPRESSION: Infiltration and effusion in the left lung base likely representing pneumonia.   Electronically Signed   By: Lucienne Capers M.D.   On: 12/13/2013 00:13    Chart has been reviewed  Assessment/Plan  63 yo M with extensive hx of smoking here with atypical chest pain and findings of PNA on CXR  Present on Admission:  CAP -  - will admit for treatment of CAP will  start on appropriate antibiotic coverage. Given extensive tobacco abuse history will need to have repeat imaging to document clearance   Obtain sputum cultures, blood cultures if febrile or if decompensates.  Provide oxygen as needed.  . Chest pain - atypical, pleuritic likely due to PNA, given risk factors will cycle CE, Serial ECG, given heart murmur and abnormal ECG will obtain echo . Hyponatremia - in the setting of PNA, obtain urine electrolytes  . Hypokalemia - will replace check Mg level . Tobacco abuse - spoke about importance of quiting Abnormal ECG - will repeat in AM , obtain echo, pericarditis a remote possibility, chest pain most likely in the setting of CAP  Prophylaxis: SCD, Protonix  CODE STATUS:  FULL CODE   Other plan as per orders.  I have spent a total of 65 min on this admission  Brittanie Dosanjh 12/12/2013, 11:39 PM  [facility]  Pager [phone]   after 2 AM please page floor coverage PA If 7AM-7PM, please contact the day team taking care of the patient  [email or web address]  Password [email or web address]

## 2013-12-12 NOTE — ED Notes (Signed)
Per EMS: pt coming from home with c/o chest pain since Wednesday. Pt has been taking BC power without relief. Pt was also given nitro x 1. Pt now denies pain. Pt A&Ox4, respirations equal and unlabored, skin warm and dry

## 2013-12-13 ENCOUNTER — Encounter (HOSPITAL_COMMUNITY): Payer: Self-pay | Admitting: *Deleted

## 2013-12-13 ENCOUNTER — Inpatient Hospital Stay (HOSPITAL_COMMUNITY): Payer: Self-pay

## 2013-12-13 DIAGNOSIS — R079 Chest pain, unspecified: Secondary | ICD-10-CM

## 2013-12-13 DIAGNOSIS — I059 Rheumatic mitral valve disease, unspecified: Secondary | ICD-10-CM

## 2013-12-13 DIAGNOSIS — R9431 Abnormal electrocardiogram [ECG] [EKG]: Secondary | ICD-10-CM | POA: Diagnosis present

## 2013-12-13 DIAGNOSIS — I422 Other hypertrophic cardiomyopathy: Secondary | ICD-10-CM

## 2013-12-13 DIAGNOSIS — E876 Hypokalemia: Secondary | ICD-10-CM

## 2013-12-13 DIAGNOSIS — J189 Pneumonia, unspecified organism: Secondary | ICD-10-CM | POA: Diagnosis present

## 2013-12-13 LAB — URINALYSIS, ROUTINE W REFLEX MICROSCOPIC
Bilirubin Urine: NEGATIVE
Glucose, UA: NEGATIVE mg/dL
Hgb urine dipstick: NEGATIVE
Ketones, ur: 15 mg/dL — AB
LEUKOCYTES UA: NEGATIVE
NITRITE: NEGATIVE
PH: 6 (ref 5.0–8.0)
Protein, ur: NEGATIVE mg/dL
Specific Gravity, Urine: 1.014 (ref 1.005–1.030)
Urobilinogen, UA: 2 mg/dL — ABNORMAL HIGH (ref 0.0–1.0)

## 2013-12-13 LAB — CBC
HCT: 40.3 % (ref 39.0–52.0)
HEMOGLOBIN: 13.5 g/dL (ref 13.0–17.0)
MCH: 30.1 pg (ref 26.0–34.0)
MCHC: 33.5 g/dL (ref 30.0–36.0)
MCV: 89.8 fL (ref 78.0–100.0)
Platelets: 281 10*3/uL (ref 150–400)
RBC: 4.49 MIL/uL (ref 4.22–5.81)
RDW: 13.7 % (ref 11.5–15.5)
WBC: 11 10*3/uL — ABNORMAL HIGH (ref 4.0–10.5)

## 2013-12-13 LAB — CREATININE, URINE, RANDOM: Creatinine, Urine: 100.45 mg/dL

## 2013-12-13 LAB — COMPREHENSIVE METABOLIC PANEL
ALK PHOS: 86 U/L (ref 39–117)
ALT: <5 U/L (ref 0–53)
ANION GAP: 13 (ref 5–15)
AST: 9 U/L (ref 0–37)
Albumin: 2.9 g/dL — ABNORMAL LOW (ref 3.5–5.2)
BUN: 9 mg/dL (ref 6–23)
CALCIUM: 8.5 mg/dL (ref 8.4–10.5)
CO2: 23 mEq/L (ref 19–32)
Chloride: 97 mEq/L (ref 96–112)
Creatinine, Ser: 0.48 mg/dL — ABNORMAL LOW (ref 0.50–1.35)
GFR calc non Af Amer: >90 mL/min (ref >90)
GLUCOSE: 170 mg/dL — AB (ref 70–99)
POTASSIUM: 3.4 meq/L — AB (ref 3.7–5.3)
Sodium: 133 mEq/L — ABNORMAL LOW (ref 137–147)
TOTAL PROTEIN: 6.5 g/dL (ref 6.0–8.3)
Total Bilirubin: 0.7 mg/dL (ref 0.3–1.2)

## 2013-12-13 LAB — LIPID PANEL
CHOL/HDL RATIO: 3.6 ratio
Cholesterol: 160 mg/dL (ref 0–200)
HDL: 44 mg/dL (ref >39)
LDL CALC: 100 mg/dL — AB (ref 0–99)
Triglycerides: 78 mg/dL (ref <150)
VLDL: 16 mg/dL (ref 0–40)

## 2013-12-13 LAB — GLUCOSE, CAPILLARY: GLUCOSE-CAPILLARY: 118 mg/dL — AB (ref 70–99)

## 2013-12-13 LAB — EXPECTORATED SPUTUM ASSESSMENT W REFEX TO RESP CULTURE

## 2013-12-13 LAB — MAGNESIUM: MAGNESIUM: 1.6 mg/dL (ref 1.5–2.5)

## 2013-12-13 LAB — TROPONIN I
Troponin I: <0.3 ng/mL (ref <0.30)
Troponin I: <0.3 ng/mL (ref <0.30)

## 2013-12-13 LAB — EXPECTORATED SPUTUM ASSESSMENT W GRAM STAIN, RFLX TO RESP C

## 2013-12-13 LAB — STREP PNEUMONIAE URINARY ANTIGEN: Strep Pneumo Urinary Antigen: NEGATIVE

## 2013-12-13 LAB — SODIUM, URINE, RANDOM: Sodium, Ur: 26 mEq/L

## 2013-12-13 LAB — PHOSPHORUS: Phosphorus: 4 mg/dL (ref 2.3–4.6)

## 2013-12-13 LAB — SEDIMENTATION RATE: SED RATE: 45 mm/h — AB (ref 0–16)

## 2013-12-13 LAB — HEMOGLOBIN A1C
Hgb A1c MFr Bld: 5.7 % — ABNORMAL HIGH (ref <5.7)
MEAN PLASMA GLUCOSE: 117 mg/dL — AB (ref <117)

## 2013-12-13 LAB — TSH: TSH: 2.65 u[IU]/mL (ref 0.350–4.500)

## 2013-12-13 LAB — OSMOLALITY, URINE: Osmolality, Ur: 404 mOsm/kg (ref 390–1090)

## 2013-12-13 IMAGING — CT CT ANGIO CHEST
1 of 9 series · 13 of 36 positions shown · IV contrast (Iodine)
Comparison: Chest radiographs [DATE]

CLINICAL DATA: Acute left-sided/substernal chest pain. Shortness of
breath. Evaluate for pulmonary embolus.

EXAM:
CT ANGIOGRAPHY CHEST WITH CONTRAST
TECHNIQUE: Multidetector CT imaging of the chest was performed using the
standard protocol during bolus administration of intravenous
contrast. Multiplanar CT image reconstructions and MIPs were
obtained to evaluate the vascular anatomy.
CONTRAST:  70mL OMNIPAQUE IOHEXOL 350 MG/ML SOLN

[Series 407: thins pacs · axial · 0.64mm/px · z∈[-295,-68]mm · 13 of 265 slices shown]
[im 19/265  lung]
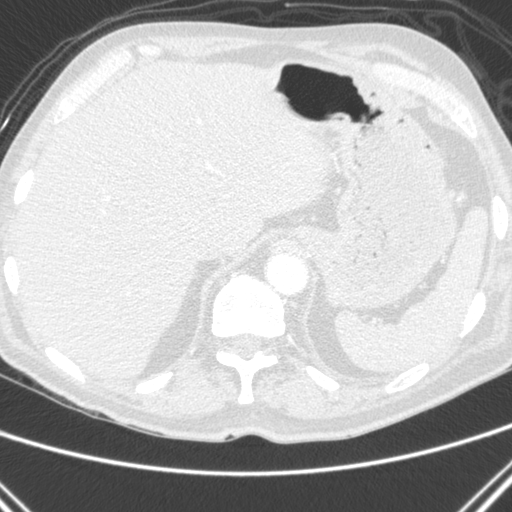
[im 38/265  mediastinal]
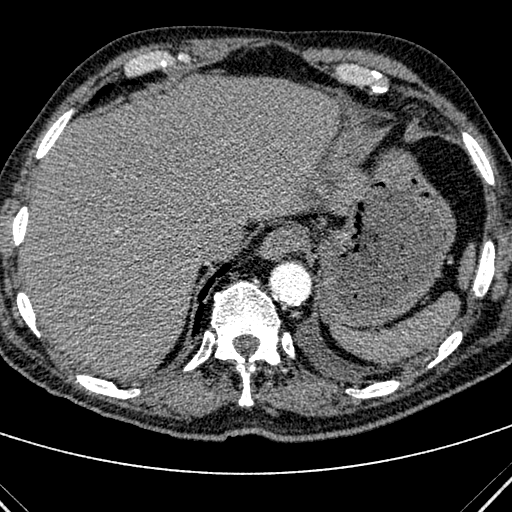
[im 57/265  lung]
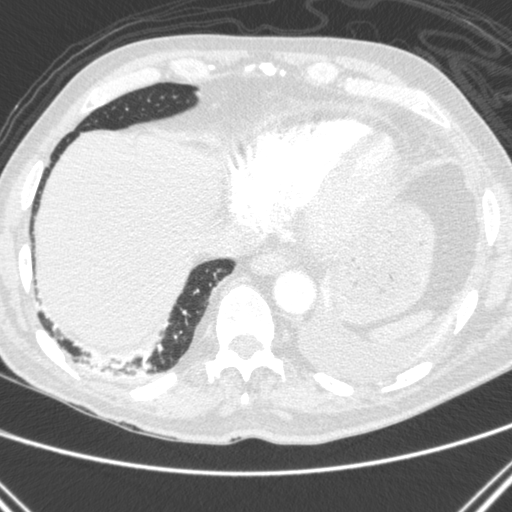
[im 76/265  mediastinal]
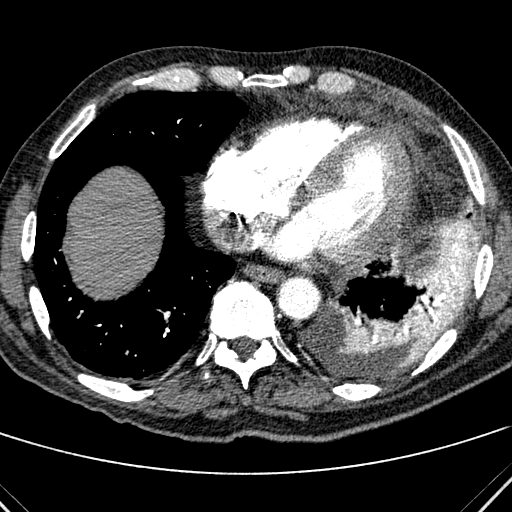
[im 95/265  lung]
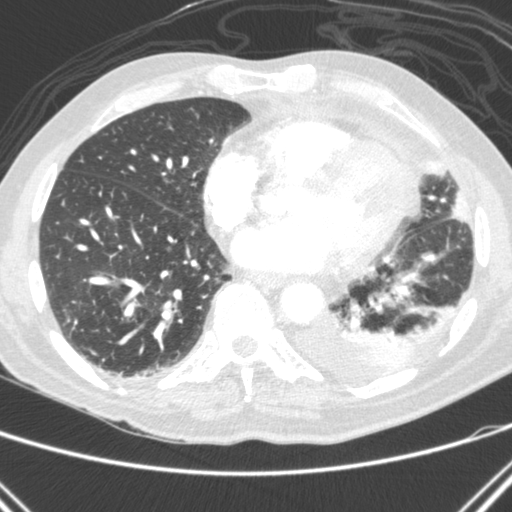
[im 114/265  mediastinal]
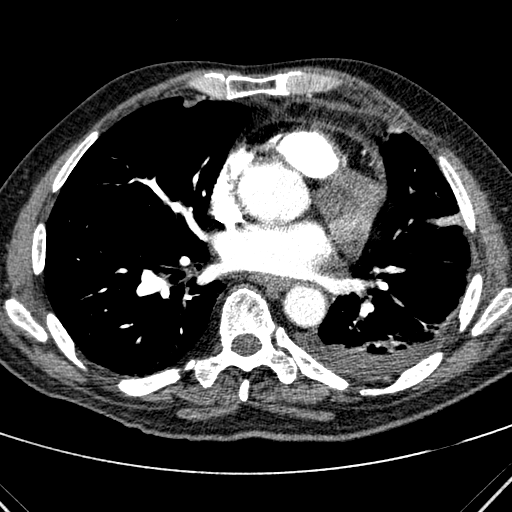
[im 133/265  lung]
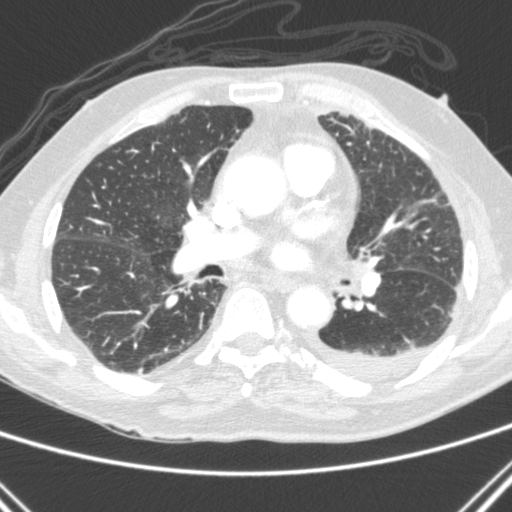
[im 151/265  mediastinal]
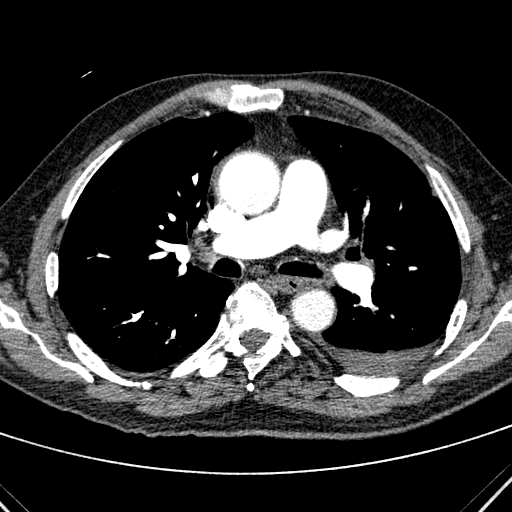
[im 170/265  lung]
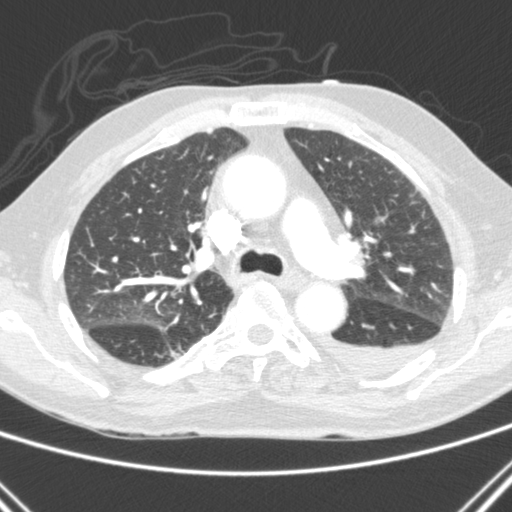
[im 189/265  mediastinal]
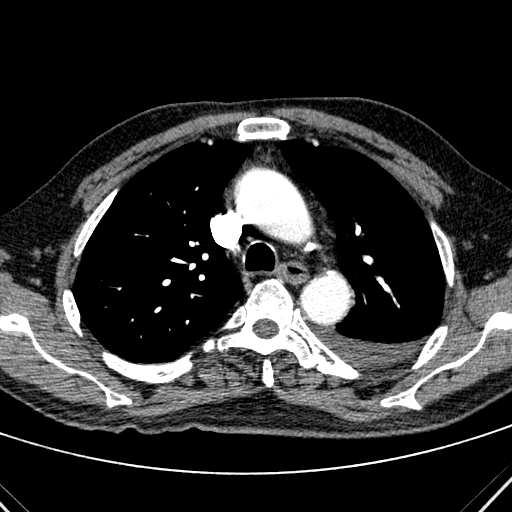
[im 208/265  lung]
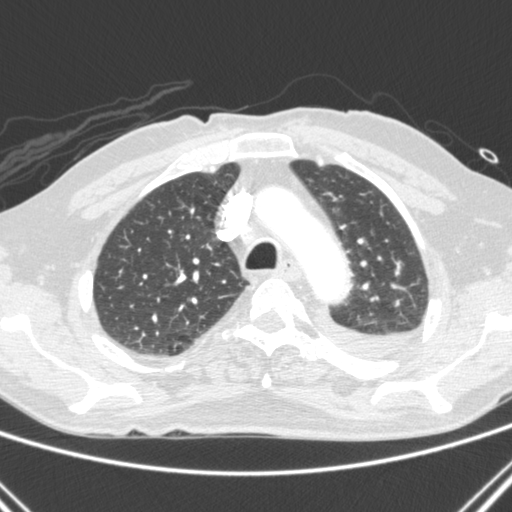
[im 227/265  mediastinal]
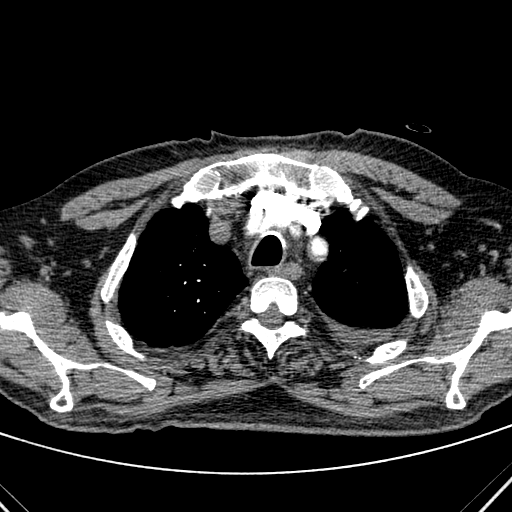
[im 246/265  lung]
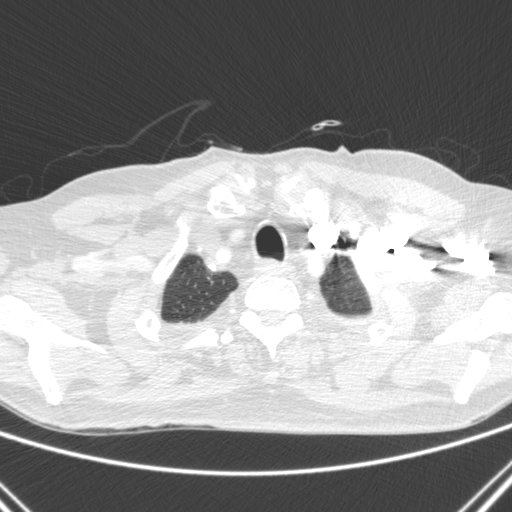

[13 of 36 positions shown; findings below may reference images not displayed]

FINDINGS: Images are mildly degraded by motion artifact, particularly
involving the left lung. No segmental or more proximal pulmonary
emboli are identified. Evaluation of subsegmental branches on the
left is limited by motion. Nonocclusive subsegmental left upper lobe
emboli cannot be completely excluded, however appearance is favored
to reflect motion. LAD and left circumflex coronary artery
calcification is noted. Heart is normal in size.

No enlarged axillary lymph nodes are identified. Subcentimeter
mediastinal lymph nodes are noted. Subcentimeter bilateral hilar
lymph nodes are also noted. There is a small pericardial effusion.
There is a small left pleural effusion.

Minimal dependent atelectasis is present in the right lower lobe.
Mild scarring or atelectasis is also noted in the right middle lobe.
Confluent opacity is present in the basilar left lower lobe and
focally in the inferior lingula.

Visualized portion of the upper abdomen demonstrates mild bilateral
adrenal gland thickening and very mild bilateral perinephric
stranding, incompletely imaged. There is a minimally displaced left
posterolateral tenth rib fracture. There are also old left eighth
and ninth rib fractures. Mild thoracic spondylosis is noted.

Review of the MIP images confirms the above findings.
IMPRESSION: 1. Acute left tenth rib fracture.
2. Small left pleural effusion. Left lower lobe and lingula
opacities are most suggestive of atelectasis, although infection
cannot be completely excluded.
3. No evidence of segmental or more proximal pulmonary emboli.
Evaluation of subsegmental branches in the left lung is limited by
motion, and nonocclusive left upper lobe subsegmental emboli cannot
be completely excluded however the appearance is favored to be
artifactual due to motion.

## 2013-12-13 MED ORDER — DEXTROSE 5 % IV SOLN
500.0000 mg | INTRAVENOUS | Status: DC
  Administered 2013-12-13 – 2013-12-14 (×2): 500 mg via INTRAVENOUS
  Filled 2013-12-13 (×2): qty 500

## 2013-12-13 MED ORDER — ALBUTEROL SULFATE (2.5 MG/3ML) 0.083% IN NEBU: 2.5000 mg | INHALATION_SOLUTION | RESPIRATORY_TRACT | Status: DC | PRN

## 2013-12-13 MED ORDER — SODIUM CHLORIDE 0.9 % IV SOLN
INTRAVENOUS | Status: DC
  Administered 2013-12-13: 02:00:00 via INTRAVENOUS

## 2013-12-13 MED ORDER — ONDANSETRON HCL 4 MG PO TABS: 4.0000 mg | ORAL_TABLET | Freq: Four times a day (QID) | ORAL | Status: DC | PRN

## 2013-12-13 MED ORDER — ENOXAPARIN SODIUM 40 MG/0.4ML ~~LOC~~ SOLN
40.0000 mg | SUBCUTANEOUS | Status: DC
  Administered 2013-12-13: 40 mg via SUBCUTANEOUS
  Filled 2013-12-13 (×2): qty 0.4

## 2013-12-13 MED ORDER — POTASSIUM CHLORIDE CRYS ER 20 MEQ PO TBCR
40.0000 meq | EXTENDED_RELEASE_TABLET | Freq: Once | ORAL | Status: AC
  Administered 2013-12-13: 40 meq via ORAL
  Filled 2013-12-13: qty 2

## 2013-12-13 MED ORDER — IPRATROPIUM BROMIDE 0.02 % IN SOLN: 0.5000 mg | Freq: Four times a day (QID) | RESPIRATORY_TRACT | Status: DC | PRN

## 2013-12-13 MED ORDER — SODIUM CHLORIDE 0.9 % IV SOLN: INTRAVENOUS | Status: DC

## 2013-12-13 MED ORDER — ONDANSETRON HCL 4 MG/2ML IJ SOLN: 4.0000 mg | Freq: Four times a day (QID) | INTRAMUSCULAR | Status: DC | PRN

## 2013-12-13 MED ORDER — METOPROLOL TARTRATE 12.5 MG HALF TABLET
12.5000 mg | ORAL_TABLET | Freq: Two times a day (BID) | ORAL | Status: DC
  Administered 2013-12-13 – 2013-12-14 (×2): 12.5 mg via ORAL
  Filled 2013-12-13 (×4): qty 1

## 2013-12-13 MED ORDER — INFLUENZA VAC SPLIT QUAD 0.5 ML IM SUSY
0.5000 mL | PREFILLED_SYRINGE | INTRAMUSCULAR | Status: AC
  Administered 2013-12-14: 0.5 mL via INTRAMUSCULAR
  Filled 2013-12-13: qty 0.5

## 2013-12-13 MED ORDER — ACETAMINOPHEN 325 MG PO TABS
650.0000 mg | ORAL_TABLET | Freq: Four times a day (QID) | ORAL | Status: DC | PRN
  Administered 2013-12-13: 650 mg via ORAL
  Filled 2013-12-13: qty 2

## 2013-12-13 MED ORDER — IPRATROPIUM BROMIDE 0.02 % IN SOLN
0.5000 mg | Freq: Four times a day (QID) | RESPIRATORY_TRACT | Status: DC
  Administered 2013-12-13 (×2): 0.5 mg via RESPIRATORY_TRACT
  Filled 2013-12-13 (×3): qty 2.5

## 2013-12-13 MED ORDER — ASPIRIN 81 MG PO CHEW
81.0000 mg | CHEWABLE_TABLET | Freq: Every day | ORAL | Status: DC
  Administered 2013-12-13 – 2013-12-14 (×2): 81 mg via ORAL
  Filled 2013-12-13 (×3): qty 1

## 2013-12-13 MED ORDER — ACETAMINOPHEN 650 MG RE SUPP: 650.0000 mg | Freq: Four times a day (QID) | RECTAL | Status: DC | PRN

## 2013-12-13 MED ORDER — MAGNESIUM SULFATE IN D5W 10-5 MG/ML-% IV SOLN
1.0000 g | Freq: Once | INTRAVENOUS | Status: AC
  Administered 2013-12-13: 1 g via INTRAVENOUS
  Filled 2013-12-13: qty 100

## 2013-12-13 MED ORDER — HYDROCODONE-ACETAMINOPHEN 5-325 MG PO TABS: 1.0000 | ORAL_TABLET | ORAL | Status: DC | PRN

## 2013-12-13 MED ORDER — DOCUSATE SODIUM 100 MG PO CAPS
100.0000 mg | ORAL_CAPSULE | Freq: Two times a day (BID) | ORAL | Status: DC
  Administered 2013-12-13 – 2013-12-14 (×3): 100 mg via ORAL
  Filled 2013-12-13 (×4): qty 1

## 2013-12-13 MED ORDER — IOHEXOL 350 MG/ML SOLN
70.0000 mL | Freq: Once | INTRAVENOUS | Status: AC | PRN
  Administered 2013-12-13: 70 mL via INTRAVENOUS

## 2013-12-13 MED ORDER — GUAIFENESIN ER 600 MG PO TB12
600.0000 mg | ORAL_TABLET | Freq: Two times a day (BID) | ORAL | Status: DC
  Administered 2013-12-13 – 2013-12-14 (×3): 600 mg via ORAL
  Filled 2013-12-13 (×5): qty 1

## 2013-12-13 MED ORDER — IBUPROFEN 600 MG PO TABS
600.0000 mg | ORAL_TABLET | Freq: Three times a day (TID) | ORAL | Status: AC
  Administered 2013-12-13 (×3): 600 mg via ORAL
  Filled 2013-12-13 (×3): qty 1

## 2013-12-13 MED ORDER — SODIUM CHLORIDE 0.9 % IJ SOLN
3.0000 mL | Freq: Two times a day (BID) | INTRAMUSCULAR | Status: DC
  Administered 2013-12-13 – 2013-12-14 (×4): 3 mL via INTRAVENOUS

## 2013-12-13 MED ORDER — PANTOPRAZOLE SODIUM 40 MG PO TBEC
40.0000 mg | DELAYED_RELEASE_TABLET | Freq: Every day | ORAL | Status: DC
  Administered 2013-12-13 – 2013-12-14 (×2): 40 mg via ORAL
  Filled 2013-12-13 (×3): qty 1

## 2013-12-13 MED ORDER — DEXTROSE 5 % IV SOLN
1.0000 g | INTRAVENOUS | Status: DC
  Administered 2013-12-13 – 2013-12-14 (×2): 1 g via INTRAVENOUS
  Filled 2013-12-13 (×2): qty 10

## 2013-12-13 MED ORDER — MORPHINE SULFATE 2 MG/ML IJ SOLN: 2.0000 mg | INTRAMUSCULAR | Status: DC | PRN

## 2013-12-13 NOTE — Consult Note (Signed)
Reason for Consult: Chest Pain Referring Physician:   [name] is an 63 y.o. male.  HPI:   The patient is a 63 yo male with a history of prostate cancer, tobacco abuse(137py).  He reports the development of chest pain last Wednesday.  At its worst was 6/10 in intensity. It is worse with laying on his back or on his side and inspiration. There was some radiation up his l neck and around into his left arm pit.  Also reports shortness of breath and productive cough. He denies any nausea, or diaphoresis, lower extremity edema as well as vomiting, fever, orthopnea, dizziness, PND, abdominal pain, hematochezia, melena, lower extremity edema, claudication.  Troponin negative thus far.  Nonspecific ST changes.     Past Medical History  Diagnosis Date  . Prostate cancer     Past Surgical History  Procedure Laterality Date  . Eye surgery    . Prostate biopsy      Prostate surgery 8-9 yrs ago    Family History  Problem Relation Age of Onset  . Cancer - Lung Father   . Heart disease Brother   . Diabetes type II Neg Hx   . Stroke Neg Hx     Social History:  reports that he has been smoking Cigarettes.  He has a 100 pack-year smoking history. He does not have any smokeless tobacco history on file. He reports that he drinks about .6 ounces of alcohol per week. He reports that he does not use illicit drugs.  Allergies: No Known Allergies  Medications:  Scheduled Meds: . azithromycin  500 mg Intravenous Q24H  . cefTRIAXone (ROCEPHIN)  IV  1 g Intravenous Q24H  . docusate sodium  100 mg Oral BID  . guaiFENesin  600 mg Oral BID  . [START ON 12/14/2013] Influenza vac split quadrivalent PF  0.5 mL Intramuscular Tomorrow-1000  . ipratropium  0.5 mg Nebulization Q6H  . pantoprazole  40 mg Oral Daily  . sodium chloride  3 mL Intravenous Q12H   Continuous Infusions: . sodium chloride     PRN Meds:.acetaminophen, albuterol, HYDROcodone-acetaminophen, morphine injection, ondansetron  (ZOFRAN) IV   Results for orders placed during the hospital encounter of 12/12/13 (from the past 48 hour(s))  CBC WITH DIFFERENTIAL     Status: Abnormal   Collection Time    12/12/13 10:09 PM      Result Value Ref Range   WBC 13.4 (*) 4.0 - 10.5 K/uL   RBC 4.62  4.22 - 5.81 MIL/uL   Hemoglobin 13.6  13.0 - 17.0 g/dL   HCT 41.1  39.0 - 52.0 %   MCV 89.0  78.0 - 100.0 fL   MCH 29.4  26.0 - 34.0 pg   MCHC 33.1  30.0 - 36.0 g/dL   RDW 13.7  11.5 - 15.5 %   Platelets 275  150 - 400 K/uL   Neutrophils Relative % 79 (*) 43 - 77 %   Neutro Abs 10.6 (*) 1.7 - 7.7 K/uL   Lymphocytes Relative 14  12 - 46 %   Lymphs Abs 1.8  0.7 - 4.0 K/uL   Monocytes Relative 6  3 - 12 %   Monocytes Absolute 0.8  0.1 - 1.0 K/uL   Eosinophils Relative 1  0 - 5 %   Eosinophils Absolute 0.1  0.0 - 0.7 K/uL   Basophils Relative 0  0 - 1 %   Basophils Absolute 0.0  0.0 - 0.1 K/uL  BASIC METABOLIC  PANEL     Status: Abnormal   Collection Time    12/12/13 10:09 PM      Result Value Ref Range   Sodium 132 (*) 137 - 147 mEq/L   Potassium 3.1 (*) 3.7 - 5.3 mEq/L   Chloride 96  96 - 112 mEq/L   CO2 18 (*) 19 - 32 mEq/L   Glucose, Bld 133 (*) 70 - 99 mg/dL   BUN 10  6 - 23 mg/dL   Creatinine, Ser 0.47 (*) 0.50 - 1.35 mg/dL   Calcium 8.7  8.4 - 10.5 mg/dL   GFR calc non Af Amer >90  >90 mL/min   GFR calc Af Amer >90  >90 mL/min   Comment: (NOTE)     The eGFR has been calculated using the CKD EPI equation.     This calculation has not been validated in all clinical situations.     eGFR's persistently <90 mL/min signify possible Chronic Kidney     Disease.   Anion gap 18 (*) 5 - 15  I-STAT TROPOININ, ED     Status: None   Collection Time    12/12/13 10:14 PM      Result Value Ref Range   Troponin i, poc 0.01  0.00 - 0.08 ng/mL   Comment 3            Comment: Due to the release kinetics of cTnI,     a negative result within the first hours     of the onset of symptoms does not rule out     myocardial  infarction with certainty.     If myocardial infarction is still suspected,     repeat the test at appropriate intervals.  GLUCOSE, CAPILLARY     Status: Abnormal   Collection Time    12/13/13  2:51 AM      Result Value Ref Range   Glucose-Capillary 118 (*) 70 - 99 mg/dL  SEDIMENTATION RATE     Status: Abnormal   Collection Time    12/13/13  4:30 AM      Result Value Ref Range   Sed Rate 45 (*) 0 - 16 mm/hr  LIPID PANEL     Status: Abnormal   Collection Time    12/13/13  4:30 AM      Result Value Ref Range   Cholesterol 160  0 - 200 mg/dL   Triglycerides 78  <150 mg/dL   HDL 44  >39 mg/dL   Total CHOL/HDL Ratio 3.6     VLDL 16  0 - 40 mg/dL   LDL Cholesterol 100 (*) 0 - 99 mg/dL   Comment:            Total Cholesterol/HDL:CHD Risk     Coronary Heart Disease Risk Table                         Men   Women      1/2 Average Risk   3.4   3.3      Average Risk       5.0   4.4      2 X Average Risk   9.6   7.1      3 X Average Risk  23.4   11.0                Use the calculated Patient Ratio     above and the CHD Risk Table     to  determine the patient's CHD Risk.                ATP III CLASSIFICATION (LDL):      <100     mg/dL   Optimal      100-129  mg/dL   Near or Above                        Optimal      130-159  mg/dL   Borderline      160-189  mg/dL   High      >190     mg/dL   Very High  MAGNESIUM     Status: None   Collection Time    12/13/13  4:30 AM      Result Value Ref Range   Magnesium 1.6  1.5 - 2.5 mg/dL  PHOSPHORUS     Status: None   Collection Time    12/13/13  4:30 AM      Result Value Ref Range   Phosphorus 4.0  2.3 - 4.6 mg/dL  COMPREHENSIVE METABOLIC PANEL     Status: Abnormal   Collection Time    12/13/13  4:30 AM      Result Value Ref Range   Sodium 133 (*) 137 - 147 mEq/L   Potassium 3.4 (*) 3.7 - 5.3 mEq/L   Chloride 97  96 - 112 mEq/L   CO2 23  19 - 32 mEq/L   Glucose, Bld 170 (*) 70 - 99 mg/dL   BUN 9  6 - 23 mg/dL   Creatinine, Ser  0.48 (*) 0.50 - 1.35 mg/dL   Calcium 8.5  8.4 - 10.5 mg/dL   Total Protein 6.5  6.0 - 8.3 g/dL   Albumin 2.9 (*) 3.5 - 5.2 g/dL   AST 9  0 - 37 U/L   ALT <5  0 - 53 U/L   Alkaline Phosphatase 86  39 - 117 U/L   Total Bilirubin 0.7  0.3 - 1.2 mg/dL   GFR calc non Af Amer >90  >90 mL/min   GFR calc Af Amer >90  >90 mL/min   Comment: (NOTE)     The eGFR has been calculated using the CKD EPI equation.     This calculation has not been validated in all clinical situations.     eGFR's persistently <90 mL/min signify possible Chronic Kidney     Disease.   Anion gap 13  5 - 15  CBC     Status: Abnormal   Collection Time    12/13/13  4:30 AM      Result Value Ref Range   WBC 11.0 (*) 4.0 - 10.5 K/uL   RBC 4.49  4.22 - 5.81 MIL/uL   Hemoglobin 13.5  13.0 - 17.0 g/dL   HCT 40.3  39.0 - 52.0 %   MCV 89.8  78.0 - 100.0 fL   MCH 30.1  26.0 - 34.0 pg   MCHC 33.5  30.0 - 36.0 g/dL   RDW 13.7  11.5 - 15.5 %   Platelets 281  150 - 400 K/uL  TROPONIN I     Status: None   Collection Time    12/13/13  4:30 AM      Result Value Ref Range   Troponin I <0.30  <0.30 ng/mL   Comment:            Due to the release kinetics of cTnI,     a negative  result within the first hours     of the onset of symptoms does not rule out     myocardial infarction with certainty.     If myocardial infarction is still suspected,     repeat the test at appropriate intervals.  TSH     Status: None   Collection Time    12/13/13  5:37 AM      Result Value Ref Range   TSH 2.650  0.350 - 4.500 uIU/mL  CREATININE, URINE, RANDOM     Status: None   Collection Time    12/13/13  7:38 AM      Result Value Ref Range   Creatinine, Urine 100.45    SODIUM, URINE, RANDOM     Status: None   Collection Time    12/13/13  7:38 AM      Result Value Ref Range   Sodium, Ur 26    URINALYSIS, ROUTINE W REFLEX MICROSCOPIC     Status: Abnormal   Collection Time    12/13/13  7:38 AM      Result Value Ref Range   Color, Urine  AMBER (*) YELLOW   Comment: BIOCHEMICALS MAY BE AFFECTED BY COLOR   APPearance CLEAR  CLEAR   Specific Gravity, Urine 1.014  1.005 - 1.030   pH 6.0  5.0 - 8.0   Glucose, UA NEGATIVE  NEGATIVE mg/dL   Hgb urine dipstick NEGATIVE  NEGATIVE   Bilirubin Urine NEGATIVE  NEGATIVE   Ketones, ur 15 (*) NEGATIVE mg/dL   Protein, ur NEGATIVE  NEGATIVE mg/dL   Urobilinogen, UA 2.0 (*) 0.0 - 1.0 mg/dL   Nitrite NEGATIVE  NEGATIVE   Leukocytes, UA NEGATIVE  NEGATIVE   Comment: MICROSCOPIC NOT DONE ON URINES WITH NEGATIVE PROTEIN, BLOOD, LEUKOCYTES, NITRITE, OR GLUCOSE <1000 mg/dL.    Dg Chest 2 View  12/13/2013   CLINICAL DATA:  Chest pain since Wednesday.  Smoker.  EXAM: CHEST  2 VIEW  COMPARISON:  07/11/2006  FINDINGS: There is infiltration in the left lung base with blunting of left costophrenic angle suggesting effusion. Findings likely represent pneumonia. Right lung is clear and expanded. Normal heart size and pulmonary vascularity. No pneumothorax.  IMPRESSION: Infiltration and effusion in the left lung base likely representing pneumonia.   Electronically Signed   By: Lucienne Capers M.D.   On: 12/13/2013 00:13    Review of Systems  Constitutional: Negative for fever and diaphoresis.  HENT: Positive for congestion. Negative for sore throat.   Respiratory: Positive for cough, sputum production and shortness of breath. Negative for wheezing.   Cardiovascular: Positive for chest pain. Negative for orthopnea, claudication, leg swelling and PND.  Gastrointestinal: Negative for nausea, vomiting, abdominal pain, blood in stool and melena.  Musculoskeletal: Positive for joint pain (Sporadic for the last 6 months).  Neurological: Negative for dizziness.  All other systems reviewed and are negative.  Blood pressure 124/71, pulse 79, temperature 98.3 F (36.8 C), temperature source Oral, resp. rate 20, height _0  (1.803 m), weight 169 lb 9.6 oz (76.93 kg), SpO2 95.00%. Physical Exam  Nursing note  and vitals reviewed. Constitutional: He is oriented to person, place, and time. He appears well-developed and well-nourished. No distress.  HENT:  Head: Normocephalic and atraumatic.  Eyes: EOM are normal. Pupils are equal, round, and reactive to light. No scleral icterus.  Neck: Normal range of motion. Neck supple. No JVD present.  Cardiovascular: Normal rate and regular rhythm.   Murmur heard.  Systolic murmur is  present with a grade of 2/6  Pulses:      Radial pulses are 2+ on the right side, and 2+ on the left side.       Dorsalis pedis pulses are 2+ on the right side, and 2+ on the left side.       Posterior tibial pulses are 2+ on the right side, and 2+ on the left side.  Murmur loudest at the apex No carotid bruit  Respiratory: Effort normal. He has rales. He exhibits no tenderness.  Decreased breath sounds throughout but greater on the left  GI: Soft. Bowel sounds are normal. He exhibits no distension. There is no tenderness.  Musculoskeletal: He exhibits no edema.  Lymphadenopathy:    He has no cervical adenopathy.  Neurological: He is alert and oriented to person, place, and time. He exhibits normal muscle tone.  Skin: Skin is warm and dry.  Psychiatric: He has a normal mood and affect.  Lipid Panel     Component Value Date/Time   CHOL 160 12/13/2013 0430   TRIG 78 12/13/2013 0430   HDL 44 12/13/2013 0430   CHOLHDL 3.6 12/13/2013 0430   VLDL 16 12/13/2013 0430   LDLCALC 100* 12/13/2013 0430     Assessment/Plan: Active Problems:   Chest pain   Hyponatremia   Hypokalemia   Tobacco abuse   Abnormal ECG   CAP (community acquired pneumonia)   systolic murmur  Plan:  Atypical chest pain. Worse with inspiration and position change. Some nonspecific ST changes, prolonged QT, atrial enlargement on EKG.  Pericarditis is possible. He is being treated for left sided pneumonia. Troponin negative x2. Given his extensive tobacco history, coronary artery disease is certainly a  possibility and he does have a systolic murmur on exam. 2-D echocardiogram is pending.  Will review and make further recs.  BP is stable on no meds.  Recommend ASA Daily.  Lipids:  LDL 100.  HDL and TG favorable.   CT angio: "Acute left tenth rib fracture. Small left pleural effusion. Left lower lobe and lingula  opacities are most suggestive of atelectasis, although infection  cannot be completely excluded. No evidence of segmental or more proximal pulmonary emboli.  Evaluation of subsegmental branches in the left lung is limited by  motion, and nonocclusive left upper lobe subsegmental emboli cannot  be completely excluded however the appearance is favored to be  artifactual due to motion"  Small pericardial effusion.   LAD and circ caclification noted.    HAGER, BRYAN, PA-C 12/13/2013, 8:44 AM    I have seen and examined the patient along with HAGER, BRYAN, PA-C, PA.  I have reviewed the chart, notes and new data.  I agree with PA's note.  Key new complaints: chest pain is clearly pleuritic, left sided Key examination changes: loud mid peaking systolic ejection murmur in the aortic focus radiates throughout the entire precordium and towards the axilla  Key new findings / data: heavy coronary artery calcification in distal LM, proximal LAD and LCX; left pleural effusion/lower lobe and lingula atelectasis, without pulmonary embolism or clear pneumonia; fractured left tenth rib (patient says this happened a year ago). Echo shows systolic anterior motion of the mitral valve with mild LV outflow obstruction and MR, normal LVEF and wall motion.  PLAN:  1. Suspect pleuritic left chest pain is due to the left pleural process (pneumonia? Rib fracture associated trauma/atelectasis?) although acute pericarditis could cause similar complaints, the ECG does not support that diagnosis. 2. Heavy coronary calcification  should be explored further (although not urgent) - recommend Lexiscan Myoview.  Avoid stress testing due to #3. Check lipids, avoid smoking. ASA 81 mg daily. 3. 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 The ECG does not show LVH, but it is seen on echo. Would start beta blockers (low dose due to reactive airway disease). DC nitrates. No high risk features for malignant ventricular arrhythmia are present, as far as I can tell.  Sanda Klein, MD, Secretary [phone] 12/13/2013, 2:59 PM

## 2013-12-13 NOTE — ED Notes (Signed)
Transporting patient to new room assignment. 

## 2013-12-13 NOTE — Progress Notes (Addendum)
Patient Demographics  Cody Douglas, is a 63 y.o. male, DOB - Dec 20, 1950, TMH:[id number]  Admit date - 12/12/2013   Admitting Physician Toy Baker, MD  Outpatient Primary MD for the patient is No primary provider on file.  LOS - 1   Chief Complaint  Patient presents with  . Chest Pain        Subjective:   Cody Douglas today has, No headache, +ve left sided pleuritic chest pain, No abdominal pain - No Nausea, No new weakness tingling or numbness, No Cough - no SOB.    Assessment & Plan    1. L sided Pl. Chest pain - this certainly looks musculoskeletal, CT scan reveals a fracture drip, pain is pleuritic features, some point tenderness as well, EKG nonacute, troponin negative, will check echogram which I suspect might show some chronic findings as he has over 50-pack-year smoking history.   For now continue supportive care, will add scheduled ibuprofen x3 doses, there is underlying atelectasis with questionable pneumonia which clinically I doubt he has no cough shortness of breath or fever, appropriate to continue antibiotics for 24 hours, and I S. and flutter valve. Reevaluate in the morning.   2. Questionable CAP. Clinically doubtful. 24 hours of IV antibiotics then likely can be transitioned to oral doxycycline for 5 more days. Supportive care with nebulizer and oxygen as needed.   3. Smoking. Could have undiagnosed underlying COPD, counseled to quit smoking.   4. Nonspecific EKG changes. Troponin negative x2, EKG nonacute, monitor echo findings.   5. Low potassium. Replaced. Recheck in the morning with mag levels.   6. Addendum. 3:30 PM - echo results discussed with cardiologist, he does have [place] M., CT scan chest also shows dense coronary calcification, placed on aspirin, needs  close outpatient cardiology followup, monitor lipid panel.     Code Status: Full  Family Communication: none present  Disposition Plan: Home    Procedures CTA, TTE pending   Consults cardiology   Medications  Scheduled Meds: . azithromycin  500 mg Intravenous Q24H  . cefTRIAXone (ROCEPHIN)  IV  1 g Intravenous Q24H  . docusate sodium  100 mg Oral BID  . guaiFENesin  600 mg Oral BID  . ibuprofen  600 mg Oral TID  . [START ON 12/14/2013] Influenza vac split quadrivalent PF  0.5 mL Intramuscular Tomorrow-1000  . ipratropium  0.5 mg Nebulization Q6H  . magnesium sulfate 1 - 4 g bolus IVPB  1 g Intravenous Once  . pantoprazole  40 mg Oral Daily  . potassium chloride  40 mEq Oral Once  . sodium chloride  3 mL Intravenous Q12H   Continuous Infusions:  PRN Meds:.acetaminophen, albuterol, HYDROcodone-acetaminophen, morphine injection, ondansetron (ZOFRAN) IV  DVT Prophylaxis  Lovenox ordered  Lab Results  Component Value Date   PLT 281 12/13/2013    Antibiotics     Anti-infectives   Start     Dose/Rate Route Frequency Ordered Stop   12/13/13 0115  cefTRIAXone (ROCEPHIN) 1 g in dextrose 5 % 50 mL IVPB     1 g 100 mL/hr over 30 Minutes Intravenous Every 24 hours 12/13/13 0113 12/20/13 0114   12/13/13 0115  azithromycin (ZITHROMAX) 500 mg in dextrose 5 % 250 mL IVPB  500 mg 250 mL/hr over 60 Minutes Intravenous Every 24 hours 12/13/13 0113 12/20/13 0114          Objective:   Filed Vitals:   12/12/13 2345 12/13/13 0030 12/13/13 0500 12/13/13 0840  BP: 114/66 101/62 98/64 124/71  Pulse: 87 84 82 79  Temp:  97.7 F (36.5 C) 98.3 F (36.8 C) 98.3 F (36.8 C)  TempSrc:  Oral Oral Oral  Resp: 22 31 24 20   Height:  5\' 11"  (1.803 m)    Weight:  76.93 kg (169 lb 9.6 oz)    SpO2: 94% 93% 98% 95%    Wt Readings from Last 3 Encounters:  12/13/13 76.93 kg (169 lb 9.6 oz)     Intake/Output Summary (Last 24 hours) at 12/13/13 1038 Last data filed at 12/13/13  0600  Gross per 24 hour  Intake   1000 ml  Output      0 ml  Net   1000 ml     Physical Exam  Awake Alert, Oriented X 3, No new F.N deficits, Normal affect Sublimity.AT,PERRAL Supple Neck,No JVD, No cervical lymphadenopathy appriciated.  Symmetrical Chest wall movement, Good air movement bilaterally, CTAB RRR,No Gallops,Rubs , systolic murmur, No Parasternal Heave +ve B.Sounds, Abd Soft, No tenderness, No organomegaly appriciated, No rebound - guarding or rigidity. No Cyanosis, Clubbing or edema, No new Rash or bruise      Data Review   Micro Results No results found for this or any previous visit (from the past 240 hour(s)).  Radiology Reports Dg Chest 2 View  12/13/2013   CLINICAL DATA:  Chest pain since Wednesday.  Smoker.  EXAM: CHEST  2 VIEW  COMPARISON:  07/11/2006  FINDINGS: There is infiltration in the left lung base with blunting of left costophrenic angle suggesting effusion. Findings likely represent pneumonia. Right lung is clear and expanded. Normal heart size and pulmonary vascularity. No pneumothorax.  IMPRESSION: Infiltration and effusion in the left lung base likely representing pneumonia.   Electronically Signed   By: Lucienne Capers M.D.   On: 12/13/2013 00:13   Ct Angio Chest Pe W/cm &/or Wo Cm  12/13/2013   CLINICAL DATA:  Acute left-sided/substernal chest pain. Shortness of breath. Evaluate for pulmonary embolus.  EXAM: CT ANGIOGRAPHY CHEST WITH CONTRAST  TECHNIQUE: Multidetector CT imaging of the chest was performed using the standard protocol during bolus administration of intravenous contrast. Multiplanar CT image reconstructions and MIPs were obtained to evaluate the vascular anatomy.  CONTRAST:  53mL OMNIPAQUE IOHEXOL 350 MG/ML SOLN  COMPARISON:  Chest radiographs 12/12/2013  FINDINGS: Images are mildly degraded by motion artifact, particularly involving the left lung. No segmental or more proximal pulmonary emboli are identified. Evaluation of subsegmental branches  on the left is limited by motion. Nonocclusive subsegmental left upper lobe emboli cannot be completely excluded, however appearance is favored to reflect motion. LAD and left circumflex coronary artery calcification is noted. Heart is normal in size.  No enlarged axillary lymph nodes are identified. Subcentimeter mediastinal lymph nodes are noted. Subcentimeter bilateral hilar lymph nodes are also noted. There is a small pericardial effusion. There is a small left pleural effusion.  Minimal dependent atelectasis is present in the right lower lobe. Mild scarring or atelectasis is also noted in the right middle lobe. Confluent opacity is present in the basilar left lower lobe and focally in the inferior lingula.  Visualized portion of the upper abdomen demonstrates mild bilateral adrenal gland thickening and very mild bilateral perinephric stranding, incompletely imaged. There  is a minimally displaced left posterolateral tenth rib fracture. There are also old left eighth and ninth rib fractures. Mild thoracic spondylosis is noted.  Review of the MIP images confirms the above findings.  IMPRESSION: 1. Acute left tenth rib fracture. 2. Small left pleural effusion. Left lower lobe and lingula opacities are most suggestive of atelectasis, although infection cannot be completely excluded. 3. No evidence of segmental or more proximal pulmonary emboli. Evaluation of subsegmental branches in the left lung is limited by motion, and nonocclusive left upper lobe subsegmental emboli cannot be completely excluded however the appearance is favored to be artifactual due to motion.   Electronically Signed   By: Logan Bores   On: 12/13/2013 10:16     CBC  Recent Labs Lab 12/12/13 2209 12/13/13 0430  WBC 13.4* 11.0*  HGB 13.6 13.5  HCT 41.1 40.3  PLT 275 281  MCV 89.0 89.8  MCH 29.4 30.1  MCHC 33.1 33.5  RDW 13.7 13.7  LYMPHSABS 1.8  --   MONOABS 0.8  --   EOSABS 0.1  --   BASOSABS 0.0  --     Chemistries    Recent Labs Lab 12/12/13 2209 12/13/13 0430  NA 132* 133*  K 3.1* 3.4*  CL 96 97  CO2 18* 23  GLUCOSE 133* 170*  BUN 10 9  CREATININE 0.47* 0.48*  CALCIUM 8.7 8.5  MG  --  1.6  AST  --  9  ALT  --  <5  ALKPHOS  --  86  BILITOT  --  0.7   ------------------------------------------------------------------------------------------------------------------ estimated creatinine clearance is 99.4 ml/min (by C-G formula based on Cr of 0.48). ------------------------------------------------------------------------------------------------------------------ No results found for this basename: HGBA1C,  in the last 72 hours ------------------------------------------------------------------------------------------------------------------  Recent Labs  12/13/13 0430  CHOL 160  HDL 44  LDLCALC 100*  TRIG 78  CHOLHDL 3.6   ------------------------------------------------------------------------------------------------------------------  Recent Labs  12/13/13 0537  TSH 2.650   ------------------------------------------------------------------------------------------------------------------ No results found for this basename: VITAMINB12, FOLATE, FERRITIN, TIBC, IRON, RETICCTPCT,  in the last 72 hours  Coagulation profile No results found for this basename: INR, PROTIME,  in the last 168 hours  No results found for this basename: DDIMER,  in the last 72 hours  Cardiac Enzymes  Recent Labs Lab 12/13/13 0430 12/13/13 0806  TROPONINI <0.30 <0.30   ------------------------------------------------------------------------------------------------------------------ No components found with this basename: POCBNP,      Time Spent in minutes  35   SINGH,PRASHANT K M.D on 12/13/2013 at 10:38 AM  Between 7am to 7pm - Pager - [phone]  After 7pm go to [email or web address] - password [email or web address]  And look for the night coverage person covering for me after hours  [facility]  Group Office  [phone]   **Disclaimer: This note may have been dictated with voice recognition software. Similar sounding words can inadvertently be transcribed and this note may contain transcription errors which may not have been corrected upon publication of note.**

## 2013-12-13 NOTE — Progress Notes (Signed)
Pharmacy Orders for ceftriaxone and azithromycin.  Pharmacy may adjust antibiotics for renal function. CrCl 99 ml/min.  Antibiotic dosing OK.  Pharmacy will sign off. Please reconsult if needed Thanks, Excell Seltzer, PharmD

## 2013-12-13 NOTE — ED Provider Notes (Signed)
I saw and evaluated the patient, reviewed the resident's note and I agree with the findings and plan.   EKG Interpretation   Date/Time:  Friday December 12 2013 23:37:17 EDT Ventricular Rate:  86 PR Interval:  121 QRS Duration: 109 QT Interval:  398 QTC Calculation: 476 R Axis:   65 Text Interpretation:  Sinus rhythm Probable left atrial enlargement  Similar to prior Confirmed by Mingo Amber  MD, [place] ([id number]) on 12/13/2013  5:36:59 PM     Patient here with L sided chest pain, concerning for ACS. Relieved with NTG. Patient's workup reveals pneumonia. Admitted.    Evelina Bucy, MD 12/13/13 1739

## 2013-12-13 NOTE — Progress Notes (Signed)
Pt admitted from [facility] from home , c/o  Chest pain , alert ,wake a nd oriented x4  . Pt in bed, cardiac monitor applied ., sinus on the monitor. Initial assessment completed  andpt been oriented to room  and use of call light. Call bell within reach. Medicated for pain to  Left chest and flank area. Rated 2/10   All due med's given  Smoking cessation education initiated. Pt in no acute distress, RN will continue to monitor.

## 2013-12-13 NOTE — Progress Notes (Signed)
  Echocardiogram 2D Echocardiogram has been performed.  Cody Douglas 12/13/2013, 12:25 PM

## 2013-12-14 DIAGNOSIS — J189 Pneumonia, unspecified organism: Principal | ICD-10-CM

## 2013-12-14 DIAGNOSIS — I422 Other hypertrophic cardiomyopathy: Secondary | ICD-10-CM

## 2013-12-14 LAB — LEGIONELLA ANTIGEN, URINE

## 2013-12-14 LAB — LIPID PANEL
CHOL/HDL RATIO: 4.1 ratio
Cholesterol: 165 mg/dL (ref 0–200)
HDL: 40 mg/dL (ref >39)
LDL Cholesterol: 96 mg/dL (ref 0–99)
Triglycerides: 147 mg/dL (ref <150)
VLDL: 29 mg/dL (ref 0–40)

## 2013-12-14 LAB — MAGNESIUM: MAGNESIUM: 2.2 mg/dL (ref 1.5–2.5)

## 2013-12-14 LAB — POTASSIUM: Potassium: 4 mEq/L (ref 3.7–5.3)

## 2013-12-14 MED ORDER — METOPROLOL TARTRATE 12.5 MG HALF TABLET: 25.0000 mg | ORAL_TABLET | Freq: Two times a day (BID) | ORAL | Status: DC

## 2013-12-14 MED ORDER — DOXYCYCLINE HYCLATE 50 MG PO CAPS: 100.0000 mg | ORAL_CAPSULE | Freq: Two times a day (BID) | ORAL | Status: DC

## 2013-12-14 MED ORDER — ALBUTEROL SULFATE HFA 108 (90 BASE) MCG/ACT IN AERS: 2.0000 | INHALATION_SPRAY | Freq: Four times a day (QID) | RESPIRATORY_TRACT | Status: DC | PRN

## 2013-12-14 MED ORDER — IBUPROFEN 600 MG PO TABS: 600.0000 mg | ORAL_TABLET | Freq: Three times a day (TID) | ORAL | Status: DC | PRN

## 2013-12-14 MED ORDER — GUAIFENESIN ER 600 MG PO TB12: 600.0000 mg | ORAL_TABLET | Freq: Two times a day (BID) | ORAL | Status: DC

## 2013-12-14 MED ORDER — ASPIRIN 81 MG PO CHEW: 81.0000 mg | CHEWABLE_TABLET | Freq: Every day | ORAL | Status: DC

## 2013-12-14 NOTE — Progress Notes (Signed)
Cardiologist: Dr. Sallyanne Kuster  Subjective:  Doing better. Still having some mild left lateral chest discomfort, pleuritic. Likely associated with pneumonia. No significant shortness of breath  Objective:  Vital Signs in the last 24 hours: Temp:  [97.2 F (36.2 C)-98.8 F (37.1 C)] 98.6 F (37 C) (10/04 0500) Pulse Rate:  [77-96] 77 (10/04 0500) Resp:  [15-22] 21 (10/04 0500) BP: (112-130)/(67-80) 121/80 mmHg (10/04 0500) SpO2:  [94 %-98 %] 98 % (10/04 0500) Weight:  [170 lb (77.111 kg)] 170 lb (77.111 kg) (10/04 0500)  Intake/Output from previous day: 10/03 0701 - 10/04 0700 In: 560 [P.O.:260; IV Piggyback:300] Out: 1975 [Urine:1975]   Physical Exam: General: Well developed, well nourished, in no acute distress. Head:  Normocephalic and atraumatic. Lungs: Poor air movement, no wheeze, normal respiratory effort Heart: Normal S1 and S2.  2/6 systolic murmur, no rubs or gallops.  Abdomen: soft, non-tender, positive bowel sounds. Extremities: No clubbing or cyanosis. No edema. Neurologic: Alert and oriented x 3. Tattoos    Lab Results:  Recent Labs  12/12/13 2209 12/13/13 0430  WBC 13.4* 11.0*  HGB 13.6 13.5  PLT 275 281    Recent Labs  12/12/13 2209 12/13/13 0430 12/14/13 0248  NA 132* 133*  --   K 3.1* 3.4* 4.0  CL 96 97  --   CO2 18* 23  --   GLUCOSE 133* 170*  --   BUN 10 9  --   CREATININE 0.47* 0.48*  --     Recent Labs  12/13/13 0806 12/13/13 1535  TROPONINI <0.30 <0.30   Hepatic Function Panel  Recent Labs  12/13/13 0430  PROT 6.5  ALBUMIN 2.9*  AST 9  ALT <5  ALKPHOS 86  BILITOT 0.7    Recent Labs  12/14/13 0248  CHOL 165   No results found for this basename: PROTIME,  in the last 72 hours  Imaging: Dg Chest 2 View  12/13/2013   CLINICAL DATA:  Chest pain since Wednesday.  Smoker.  EXAM: CHEST  2 VIEW  COMPARISON:  07/11/2006  FINDINGS: There is infiltration in the left lung base with blunting of left costophrenic angle  suggesting effusion. Findings likely represent pneumonia. Right lung is clear and expanded. Normal heart size and pulmonary vascularity. No pneumothorax.  IMPRESSION: Infiltration and effusion in the left lung base likely representing pneumonia.   Electronically Signed   By: Lucienne Capers M.D.   On: 12/13/2013 00:13   Ct Angio Chest Pe W/cm &/or Wo Cm  12/13/2013   CLINICAL DATA:  Acute left-sided/substernal chest pain. Shortness of breath. Evaluate for pulmonary embolus.  EXAM: CT ANGIOGRAPHY CHEST WITH CONTRAST  TECHNIQUE: Multidetector CT imaging of the chest was performed using the standard protocol during bolus administration of intravenous contrast. Multiplanar CT image reconstructions and MIPs were obtained to evaluate the vascular anatomy.  CONTRAST:  60mL OMNIPAQUE IOHEXOL 350 MG/ML SOLN  COMPARISON:  Chest radiographs 12/12/2013  FINDINGS: Images are mildly degraded by motion artifact, particularly involving the left lung. No segmental or more proximal pulmonary emboli are identified. Evaluation of subsegmental branches on the left is limited by motion. Nonocclusive subsegmental left upper lobe emboli cannot be completely excluded, however appearance is favored to reflect motion. LAD and left circumflex coronary artery calcification is noted. Heart is normal in size.  No enlarged axillary lymph nodes are identified. Subcentimeter mediastinal lymph nodes are noted. Subcentimeter bilateral hilar lymph nodes are also noted. There is a small pericardial effusion. There is a small left pleural  effusion.  Minimal dependent atelectasis is present in the right lower lobe. Mild scarring or atelectasis is also noted in the right middle lobe. Confluent opacity is present in the basilar left lower lobe and focally in the inferior lingula.  Visualized portion of the upper abdomen demonstrates mild bilateral adrenal gland thickening and very mild bilateral perinephric stranding, incompletely imaged. There is a  minimally displaced left posterolateral tenth rib fracture. There are also old left eighth and ninth rib fractures. Mild thoracic spondylosis is noted.  Review of the MIP images confirms the above findings.  IMPRESSION: 1. Acute left tenth rib fracture. 2. Small left pleural effusion. Left lower lobe and lingula opacities are most suggestive of atelectasis, although infection cannot be completely excluded. 3. No evidence of segmental or more proximal pulmonary emboli. Evaluation of subsegmental branches in the left lung is limited by motion, and nonocclusive left upper lobe subsegmental emboli cannot be completely excluded however the appearance is favored to be artifactual due to motion.   Electronically Signed   By: Logan Bores   On: 12/13/2013 10:16   Personally viewed.   Telemetry: No adverse arrhythmias, no PVCs Personally viewed.   EKG:  Subtle nonspecific ST-T wave changes, sinus rhythm  Cardiac Studies:  Systolic anterior motion of mitral valve, severe LVH, asymmetric septal hypertrophy, left ventricular outflow tract flow acceleration, probable hypertrophic cardiomyopathy with mild mitral regurgitation.  Assessment/Plan:  Active Problems:   Chest pain   Hyponatremia   Hypokalemia   Tobacco abuse   Abnormal ECG   CAP (community acquired pneumonia)  63 year old male with echocardiographic findings consistent with hypertrophic cardiomyopathy, recent chest pain, tobacco use, pneumonia.  1. Chest pain-reassuring troponin. No wall motion abnormalities on echocardiogram. Normal ejection fraction. I have reviewed Dr. Sallyanne Kuster consult note from yesterday. EKG does not support diagnosis of pericarditis. Pleuritic left chest pain likely pleural process, possible pneumonia. Heavy coronary calcification noted on CT and we will have him followup in clinic. He will determine whether or not he would like to proceed with pharmacologic stress testing. He seems to be tolerating beta blockers, prior  concern with COPD. -I will increase his beta blocker to 25 mg twice a day.  2. Hypokalemia-replete per primary team. 3. Hyponatremia-question if this is secondary to pulmonary process 4. Pneumonia-current IV antibiotics, no fever 5. Tobacco use-counsel cessation.   From a cardiology perspective, comfortable with discharge. We will have followup in approximately 2 weeks in outpatient clinic.  SKAINS, [place] 12/14/2013, 9:27 AM

## 2013-12-14 NOTE — Discharge Summary (Signed)
PATIENT DETAILS Name: Cody Douglas Age: 63 y.o. Sex: male Date of Birth: [birth year] MRN: [id number]. Admitting Physician: Toy Baker, MD PCP:No primary provider on file.  Admit Date: 12/12/2013 Discharge date: 12/14/2013  Recommendations for Outpatient Follow-up:  1. Gen. health maintenance 2. Please repeat chest x-ray in 6-8 weeks to document resolution of pneumonia 3. Counseled regarding importance of cessation of tobacco abuse  PRIMARY DISCHARGE DIAGNOSIS:  Active Problems:   Chest pain   Hyponatremia   Hypokalemia   Tobacco abuse   Abnormal ECG   CAP (community acquired pneumonia)   Hypertrophic cardiomyopathy      PAST MEDICAL HISTORY: Past Medical History  Diagnosis Date  . Prostate cancer     DISCHARGE MEDICATIONS:   Medication List    STOP taking these medications       BC HEADACHE POWDER PO      TAKE these medications       albuterol 108 (90 BASE) MCG/ACT inhaler  Commonly known as:  PROVENTIL HFA;VENTOLIN HFA  Inhale 2 puffs into the lungs every 6 (six) hours as needed for wheezing or shortness of breath.     aspirin 81 MG chewable tablet  Chew 1 tablet (81 mg total) by mouth daily.     doxycycline 50 MG capsule  Commonly known as:  VIBRAMYCIN  Take 2 capsules (100 mg total) by mouth 2 (two) times daily.     guaiFENesin 600 MG 12 hr tablet  Commonly known as:  MUCINEX  Take 1 tablet (600 mg total) by mouth 2 (two) times daily.     ibuprofen 600 MG tablet  Commonly known as:  ADVIL,MOTRIN  Take 1 tablet (600 mg total) by mouth every 8 (eight) hours as needed.     metoprolol tartrate 12.5 mg Tabs tablet  Commonly known as:  LOPRESSOR  Take 1 tablet (25 mg total) by mouth 2 (two) times daily.     multivitamin with minerals tablet  Take 1 tablet by mouth daily.     omeprazole 20 MG capsule  Commonly known as:  PRILOSEC  Take 20 mg by mouth daily.        ALLERGIES:  No Known Allergies  BRIEF HPI:  See H&P, Labs, Consult  and Test reports for all details in brief, patient was admitted for evaluation of left-sided chest pain.  CONSULTATIONS:   cardiology  PERTINENT RADIOLOGIC STUDIES: Dg Chest 2 View  12/13/2013   CLINICAL DATA:  Chest pain since Wednesday.  Smoker.  EXAM: CHEST  2 VIEW  COMPARISON:  07/11/2006  FINDINGS: There is infiltration in the left lung base with blunting of left costophrenic angle suggesting effusion. Findings likely represent pneumonia. Right lung is clear and expanded. Normal heart size and pulmonary vascularity. No pneumothorax.  IMPRESSION: Infiltration and effusion in the left lung base likely representing pneumonia.   Electronically Signed   By: Lucienne Capers M.D.   On: 12/13/2013 00:13   Ct Angio Chest Pe W/cm &/or Wo Cm  12/13/2013   CLINICAL DATA:  Acute left-sided/substernal chest pain. Shortness of breath. Evaluate for pulmonary embolus.  EXAM: CT ANGIOGRAPHY CHEST WITH CONTRAST  TECHNIQUE: Multidetector CT imaging of the chest was performed using the standard protocol during bolus administration of intravenous contrast. Multiplanar CT image reconstructions and MIPs were obtained to evaluate the vascular anatomy.  CONTRAST:  28mL OMNIPAQUE IOHEXOL 350 MG/ML SOLN  COMPARISON:  Chest radiographs 12/12/2013  FINDINGS: Images are mildly degraded by motion artifact, particularly involving the left lung. No  segmental or more proximal pulmonary emboli are identified. Evaluation of subsegmental branches on the left is limited by motion. Nonocclusive subsegmental left upper lobe emboli cannot be completely excluded, however appearance is favored to reflect motion. LAD and left circumflex coronary artery calcification is noted. Heart is normal in size.  No enlarged axillary lymph nodes are identified. Subcentimeter mediastinal lymph nodes are noted. Subcentimeter bilateral hilar lymph nodes are also noted. There is a small pericardial effusion. There is a small left pleural effusion.  Minimal  dependent atelectasis is present in the right lower lobe. Mild scarring or atelectasis is also noted in the right middle lobe. Confluent opacity is present in the basilar left lower lobe and focally in the inferior lingula.  Visualized portion of the upper abdomen demonstrates mild bilateral adrenal gland thickening and very mild bilateral perinephric stranding, incompletely imaged. There is a minimally displaced left posterolateral tenth rib fracture. There are also old left eighth and ninth rib fractures. Mild thoracic spondylosis is noted.  Review of the MIP images confirms the above findings.  IMPRESSION: 1. Acute left tenth rib fracture. 2. Small left pleural effusion. Left lower lobe and lingula opacities are most suggestive of atelectasis, although infection cannot be completely excluded. 3. No evidence of segmental or more proximal pulmonary emboli. Evaluation of subsegmental branches in the left lung is limited by motion, and nonocclusive left upper lobe subsegmental emboli cannot be completely excluded however the appearance is favored to be artifactual due to motion.   Electronically Signed   By: Logan Bores   On: 12/13/2013 10:16     PERTINENT LAB RESULTS: CBC:  Recent Labs  12/12/13 2209 12/13/13 0430  WBC 13.4* 11.0*  HGB 13.6 13.5  HCT 41.1 40.3  PLT 275 281   CMET CMP     Component Value Date/Time   NA 133* 12/13/2013 0430   K 4.0 12/14/2013 0248   CL 97 12/13/2013 0430   CO2 23 12/13/2013 0430   GLUCOSE 170* 12/13/2013 0430   BUN 9 12/13/2013 0430   CREATININE 0.48* 12/13/2013 0430   CALCIUM 8.5 12/13/2013 0430   PROT 6.5 12/13/2013 0430   ALBUMIN 2.9* 12/13/2013 0430   AST 9 12/13/2013 0430   ALT <5 12/13/2013 0430   ALKPHOS 86 12/13/2013 0430   BILITOT 0.7 12/13/2013 0430   GFRNONAA >90 12/13/2013 0430   GFRAA >90 12/13/2013 0430    GFR Estimated Creatinine Clearance: 99.4 ml/min (by C-G formula based on Cr of 0.48). No results found for this basename: LIPASE, AMYLASE,  in  the last 72 hours  Recent Labs  12/13/13 0430 12/13/13 0806 12/13/13 1535  TROPONINI <0.30 <0.30 <0.30   No components found with this basename: POCBNP,  No results found for this basename: DDIMER,  in the last 72 hours  Recent Labs  12/13/13 0806  HGBA1C 5.7*    Recent Labs  12/13/13 0430 12/14/13 0248  CHOL 160 165  HDL 44 40  LDLCALC 100* 96  TRIG 78 147  CHOLHDL 3.6 4.1    Recent Labs  12/13/13 0537  TSH 2.650   No results found for this basename: VITAMINB12, FOLATE, FERRITIN, TIBC, IRON, RETICCTPCT,  in the last 72 hours Coags: No results found for this basename: PT, INR,  in the last 72 hours Microbiology: Recent Results (from the past 240 hour(s))  CULTURE, EXPECTORATED SPUTUM-ASSESSMENT     Status: None   Collection Time    12/13/13  7:19 PM      Result Value  Ref Range Status   Specimen Description SPUTUM   Final   Special Requests NONE   Final   Sputum evaluation     Final   Value: MICROSCOPIC FINDINGS SUGGEST THAT THIS SPECIMEN IS NOT REPRESENTATIVE OF LOWER RESPIRATORY SECRETIONS. PLEASE RECOLLECT.     CALLED TO MICHELLE FERGUSON 12/13/13 AT [place]   Report Status 12/13/2013 FINAL   Final     BRIEF HOSPITAL COURSE:  Left-sided chest pain - Suspect musculoskeletal, pain is pleuritic. CT angiogram of the chest negative for pulmonary embolism, however positive for left 10th rib fracture. Cardiac enzymes negative, echocardiogram demonstrated preserved ejection fraction. Please note, CT of the chest did demonstrate coronary calcification, patient has been asked to make an appointment with cardiology in 2 weeks-likely will need outpatient nuclear stress test.  Suspected community acquired pneumonia - May have atelectasis/developed a pneumonia secondary to rib fracture. Admitted and started on IV antibiotics, will transition to doxycycline on discharge.  Hypertrophic obstructive cardiomyopathy - Workup for chest pain included  echocardiogram-which demonstrated hypertrophic obstructive cardiomyopathy. Seen by cardiology in consult, started on beta blockers, advised to increase beta blockers to 25 mg twice a day. No further workup suggested as an inpatient, patient has been asked to make an appointment with cardiology in 2 weeks.  Tobacco abuse - Counseled extensively. I do not think he is motivated to quit at this time.  TODAY-DAY OF DISCHARGE:  Subjective:   Stellan Vick today has no headache,no abdominal pain,no new weakness tingling or numbness, feels much better wants to go home today. Pleuritic chest pain is significantly improved.  Objective:   Blood pressure 144/79, pulse 81, temperature 98.6 F (37 C), temperature source Oral, resp. rate 21, height 5\' 11"  (1.803 m), weight 77.111 kg (170 lb), SpO2 98.00%.  Intake/Output Summary (Last 24 hours) at 12/14/13 1259 Last data filed at 12/14/13 0900  Gross per 24 hour  Intake    680 ml  Output   2525 ml  Net  -1845 ml   Filed Weights   12/12/13 2158 12/13/13 0030 12/14/13 0500  Weight: 81.647 kg (180 lb) 76.93 kg (169 lb 9.6 oz) 77.111 kg (170 lb)    Exam Awake Alert, Oriented *3, No new F.N deficits, Normal affect Hodge.AT,PERRAL Supple Neck,No JVD, No cervical lymphadenopathy appriciated.  Symmetrical Chest wall movement, Good air movement bilaterally, CTAB RRR,No Gallops,Rubs or new Murmurs, No Parasternal Heave +ve B.Sounds, Abd Soft, Non tender, No organomegaly appriciated, No rebound -guarding or rigidity. No Cyanosis, Clubbing or edema, No new Rash or bruise  DISCHARGE CONDITION: Stable  DISPOSITION: Home  DISCHARGE INSTRUCTIONS:    Activity:  As tolerated  Diet recommendation: Heart Healthy diet  Discharge Instructions   Call MD for:  difficulty breathing, headache or visual disturbances    Complete by:  As directed      Call MD for:  severe uncontrolled pain    Complete by:  As directed      Call MD for:  temperature >100.4     Complete by:  As directed      Diet - low sodium heart healthy    Complete by:  As directed      Increase activity slowly    Complete by:  As directed            Follow-up Information   Follow up with [facility]    . Schedule an appointment as soon as possible for a visit in 1 week.  Contact information:   [place] [address] [phone]      Follow up with CROITORU,MIHAI, MD. Schedule an appointment as soon as possible for a visit in 2 weeks.   Specialty:  Cardiology   Contact information:   [address] [place] [address] [phone]       Total Time spent on discharge equals 45 minutes.  SignedOren Binet 12/14/2013 12:59 PM  **Disclaimer: This note may have been dictated with voice recognition software. Similar sounding words can inadvertently be transcribed and this note may contain transcription errors which may not have been corrected upon publication of note.**

## 2013-12-15 ENCOUNTER — Telehealth: Payer: Self-pay | Admitting: Cardiovascular Disease

## 2013-12-15 NOTE — Progress Notes (Signed)
Utilization review completed. Carmell Elgin, RN, BSN. 

## 2013-12-15 NOTE — Telephone Encounter (Signed)
Closed encounter °

## 2014-01-01 ENCOUNTER — Ambulatory Visit: Payer: Self-pay | Admitting: Cardiology

## 2019-09-12 ENCOUNTER — Emergency Department (HOSPITAL_COMMUNITY): Payer: Medicare Other

## 2019-09-12 ENCOUNTER — Other Ambulatory Visit: Payer: Self-pay

## 2019-09-12 ENCOUNTER — Encounter (HOSPITAL_COMMUNITY): Payer: Self-pay

## 2019-09-12 ENCOUNTER — Emergency Department (HOSPITAL_COMMUNITY)
Admission: EM | Admit: 2019-09-12 | Discharge: 2019-09-13 | Disposition: A | Discharge status: Home / Self Care | Payer: Medicare Other | Attending: Emergency Medicine | Admitting: Emergency Medicine

## 2019-09-12 DIAGNOSIS — R42 Dizziness and giddiness: Secondary | ICD-10-CM | POA: Insufficient documentation

## 2019-09-12 DIAGNOSIS — Z79899 Other long term (current) drug therapy: Secondary | ICD-10-CM | POA: Insufficient documentation

## 2019-09-12 DIAGNOSIS — I10 Essential (primary) hypertension: Secondary | ICD-10-CM | POA: Diagnosis not present

## 2019-09-12 DIAGNOSIS — F17210 Nicotine dependence, cigarettes, uncomplicated: Secondary | ICD-10-CM | POA: Diagnosis not present

## 2019-09-12 DIAGNOSIS — Z7982 Long term (current) use of aspirin: Secondary | ICD-10-CM | POA: Diagnosis not present

## 2019-09-12 DIAGNOSIS — R1110 Vomiting, unspecified: Secondary | ICD-10-CM | POA: Diagnosis present

## 2019-09-12 HISTORY — DX: Essential (primary) hypertension: I10

## 2019-09-12 LAB — LIPASE, BLOOD: Lipase: 31 U/L (ref 11–51)

## 2019-09-12 LAB — COMPREHENSIVE METABOLIC PANEL
ALT: 12 U/L (ref 0–44)
AST: 21 U/L (ref 15–41)
Albumin: 4.2 g/dL (ref 3.5–5.0)
Alkaline Phosphatase: 59 U/L (ref 38–126)
Anion gap: 13 (ref 5–15)
BUN: 20 mg/dL (ref 8–23)
CO2: 27 mmol/L (ref 22–32)
Calcium: 8.7 mg/dL — ABNORMAL LOW (ref 8.9–10.3)
Chloride: 102 mmol/L (ref 98–111)
Creatinine, Ser: 0.6 mg/dL — ABNORMAL LOW (ref 0.61–1.24)
GFR calc Af Amer: >60 mL/min (ref >60)
GFR calc non Af Amer: >60 mL/min (ref >60)
Glucose, Bld: 139 mg/dL — ABNORMAL HIGH (ref 70–99)
Potassium: 3.9 mmol/L (ref 3.5–5.1)
Sodium: 142 mmol/L (ref 135–145)
Total Bilirubin: 0.3 mg/dL (ref 0.3–1.2)
Total Protein: 7.2 g/dL (ref 6.5–8.1)

## 2019-09-12 LAB — CBC
HCT: 46.1 % (ref 39.0–52.0)
Hemoglobin: 15.2 g/dL (ref 13.0–17.0)
MCH: 32 pg (ref 26.0–34.0)
MCHC: 33 g/dL (ref 30.0–36.0)
MCV: 97.1 fL (ref 80.0–100.0)
Platelets: 256 10*3/uL (ref 150–400)
RBC: 4.75 MIL/uL (ref 4.22–5.81)
RDW: 13.1 % (ref 11.5–15.5)
WBC: 17.3 10*3/uL — ABNORMAL HIGH (ref 4.0–10.5)
nRBC: 0 % (ref 0.0–0.2)

## 2019-09-12 IMAGING — CR DG CHEST 2V
3 series · 3 of 3 positions shown · non-contrast
Comparison: [DATE]

CLINICAL DATA: Nausea, vomiting, dizziness, shortness of breath

EXAM:
CHEST - 2 VIEW

[w chest lat (1 of 2)]
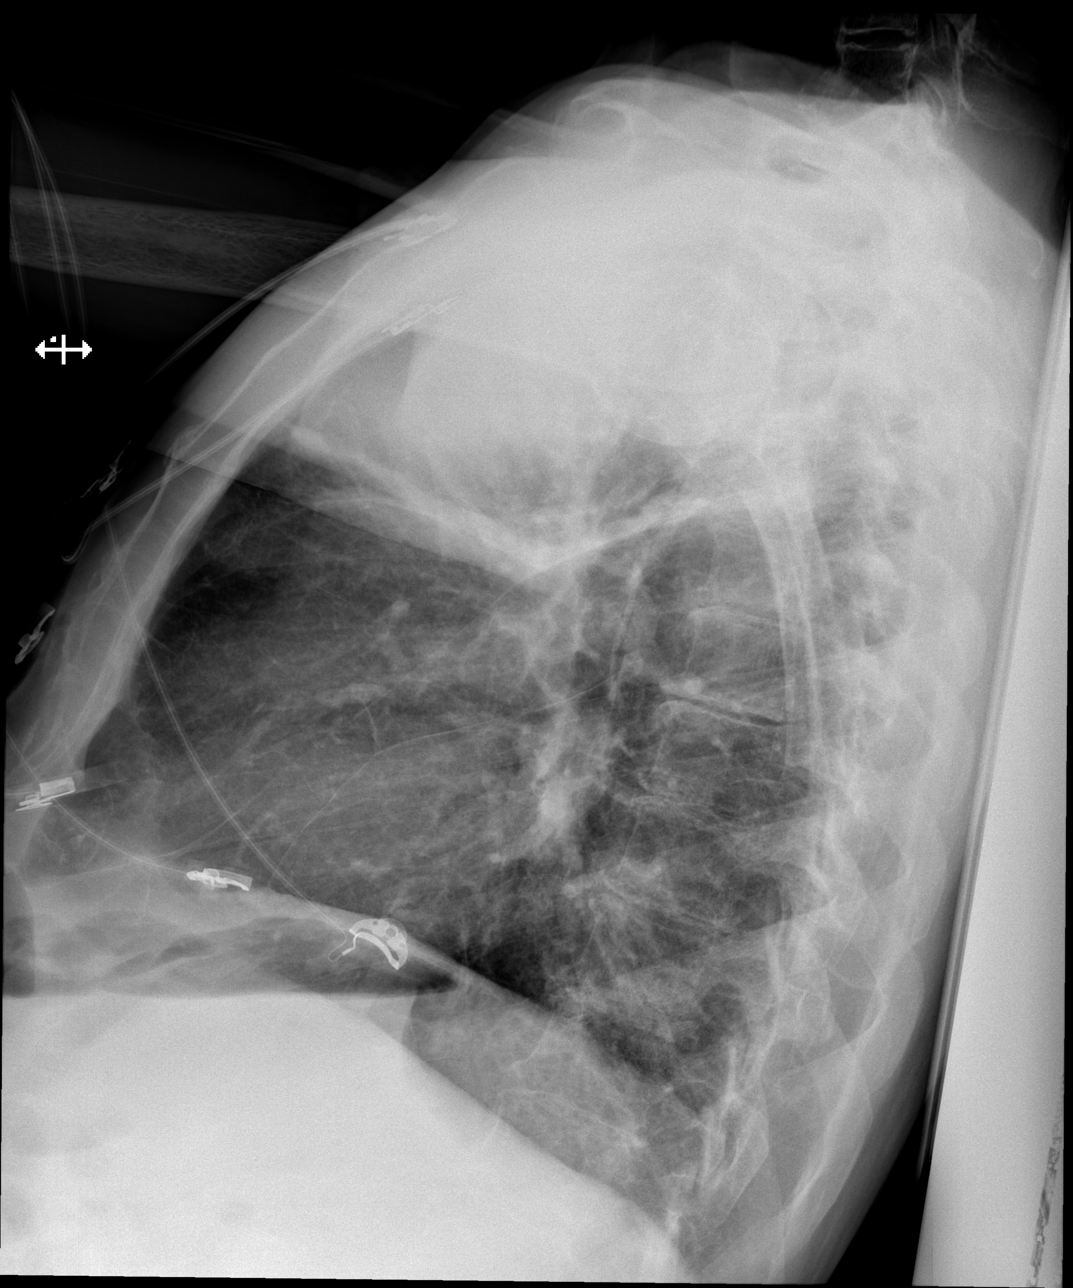

[w chest lat (2 of 2)]
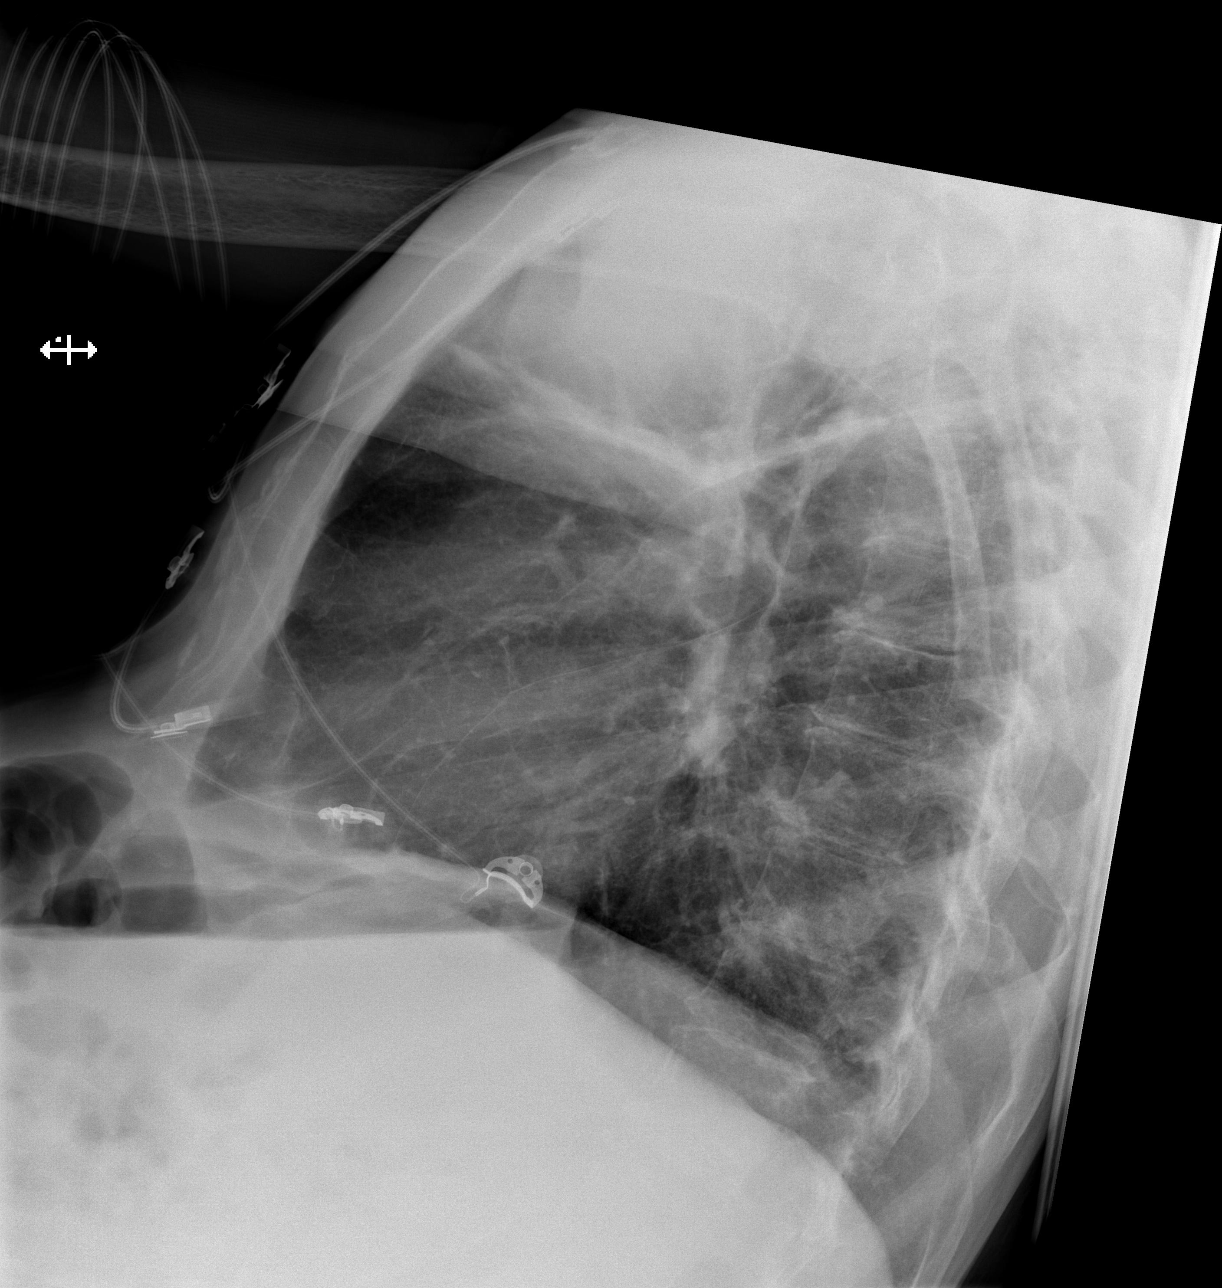

[x chest ap]
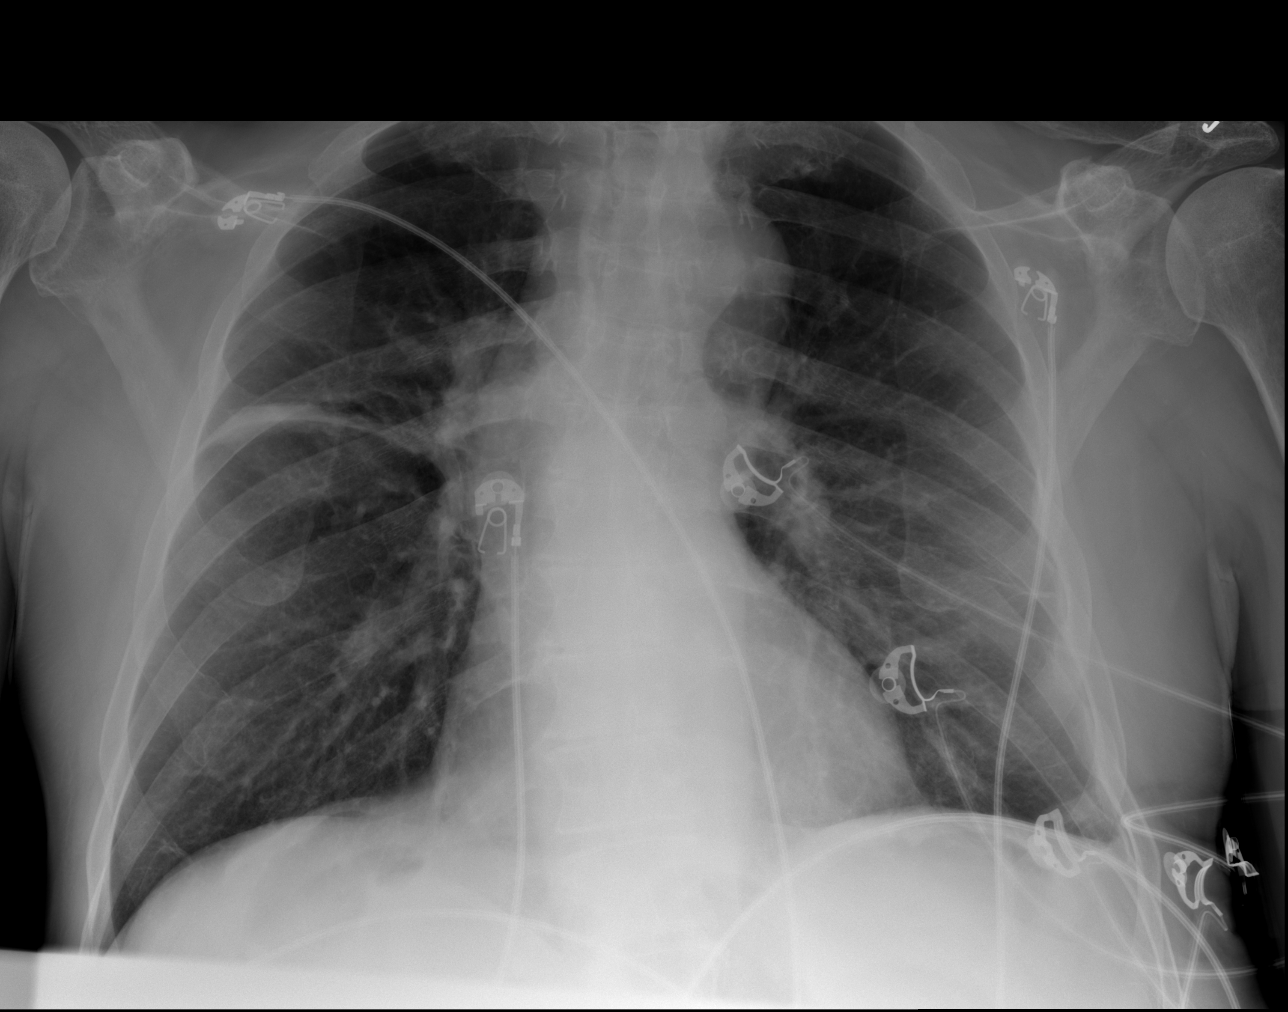

[3 of 3 positions shown; findings below may reference images not displayed]

FINDINGS: Heart is normal size. Platelike atelectasis in the right upper lobe.
Left lung clear. No effusions. No acute bony abnormality.
IMPRESSION: Right upper lobe atelectasis.

No active disease.

## 2019-09-12 MED ORDER — SODIUM CHLORIDE 0.9% FLUSH: 3.0000 mL | Freq: Once | INTRAVENOUS | Status: DC

## 2019-09-12 NOTE — ED Notes (Signed)
SpO2 staying between 96-100% on RA

## 2019-09-12 NOTE — ED Triage Notes (Addendum)
Pt reports BIB EMS from home. Pt reports sudden onset of nausea, vomiting, dizziness.  SpO2 mid 80s upon arrival and up to 99% on 10L non-rebreather. A&O x4.   4mg  Zofran 20G RH BP 138/82 HR 65 CBG 118

## 2019-09-13 DIAGNOSIS — R42 Dizziness and giddiness: Secondary | ICD-10-CM | POA: Diagnosis not present

## 2019-09-13 LAB — URINALYSIS, ROUTINE W REFLEX MICROSCOPIC
Bilirubin Urine: NEGATIVE
Glucose, UA: NEGATIVE mg/dL
Hgb urine dipstick: NEGATIVE
Ketones, ur: NEGATIVE mg/dL
Leukocytes,Ua: NEGATIVE
Nitrite: NEGATIVE
Protein, ur: NEGATIVE mg/dL
Specific Gravity, Urine: 1.023 (ref 1.005–1.030)
pH: 5 (ref 5.0–8.0)

## 2019-09-13 LAB — TROPONIN I (HIGH SENSITIVITY)
Troponin I (High Sensitivity): 5 ng/L (ref <18)
Troponin I (High Sensitivity): 5 ng/L (ref <18)

## 2019-09-13 MED ORDER — SODIUM CHLORIDE 0.9 % IV BOLUS (SEPSIS)
1000.0000 mL | Freq: Once | INTRAVENOUS | Status: AC
  Administered 2019-09-13: 1000 mL via INTRAVENOUS

## 2019-09-13 NOTE — ED Notes (Signed)
Pt ambulated in hallway while monitoring oxygen saturation.  Saturation maintained at or above 95% throughout.  MD aware.

## 2019-09-13 NOTE — Discharge Instructions (Addendum)

## 2019-09-13 NOTE — ED Provider Notes (Signed)
[facility] DEPT Provider Note   CSN: [id number] Arrival date & time: 09/12/19  2151     History Chief Complaint  Patient presents with  . Emesis  . Dizziness    Cody Douglas is a 69 y.o. male.  The history is provided by the patient.  Dizziness Quality:  Room spinning Severity:  Severe Onset quality:  Sudden Timing:  Constant Progression:  Improving Chronicity:  New Relieved by: zofran and oxygen. Worsened by:  Nothing Associated symptoms: vomiting   Associated symptoms: no chest pain, no headaches, no syncope, no tinnitus and no weakness   Patient with a history of hypertension presents with dizziness.  Patient reports he was in his normal state of health when he had sudden onset of dizziness, nausea and vomiting.  No syncope.  No chest pain, no shortness of breath.  He has been in his otherwise normal state of health recently.  EMS was called and when they arrived patient was noted to be hypoxic.  He was given oxygen and Zofran and he felt improved. He is fully vaccinated for COVID-19 He is a smoker, but denies any known lung conditions     Past Medical History:  Diagnosis Date  . Hypertension   . Prostate cancer [facility])     Patient Active Problem List   Diagnosis Date Noted  . Hypertrophic cardiomyopathy ([place]) 12/14/2013  . Abnormal ECG 12/13/2013  . CAP (community acquired pneumonia) 12/13/2013  . Chest pain 12/12/2013  . Hyponatremia 12/12/2013  . Hypokalemia 12/12/2013  . Tobacco abuse 12/12/2013    Past Surgical History:  Procedure Laterality Date  . EYE SURGERY    . PROSTATE BIOPSY     Prostate surgery 8-9 yrs ago       Family History  Problem Relation Age of Onset  . Cancer - Lung Father   . Heart disease Brother   . Diabetes type II Neg Hx   . Stroke Neg Hx     Social History   Tobacco Use  . Smoking status: Current Every Day Smoker    Packs/day: 2.50    Years: 40.00    Pack years: 100.00    Types:  Cigarettes  Substance Use Topics  . Alcohol use: Yes    Alcohol/week: 1.0 standard drink    Types: 1 Cans of beer per week    Comment: couple of days a week  . Drug use: No    Home Medications Prior to Admission medications   Medication Sig Start Date End Date Taking? Authorizing Provider  ALPRAZolam (XANAX) 0.25 MG tablet Take 0.25 mg by mouth 3 (three) times daily. 09/10/19  Yes [provider]  cyclobenzaprine (FLEXERIL) 5 MG tablet Take 5 mg by mouth 2 (two) times daily. 08/14/19  Yes [provider]  lisinopril-hydrochlorothiazide (ZESTORETIC) 10-12.5 MG tablet Take 1 tablet by mouth daily. 07/12/19  Yes [provider]  meloxicam (MOBIC) 7.5 MG tablet Take 7.5 mg by mouth daily. 08/15/19  Yes [provider]  albuterol (PROVENTIL HFA;VENTOLIN HFA) 108 (90 BASE) MCG/ACT inhaler Inhale 2 puffs into the lungs every 6 (six) hours as needed for wheezing or shortness of breath. 12/14/13   Ghimire, Henreitta Leber, MD  aspirin 81 MG chewable tablet Chew 1 tablet (81 mg total) by mouth daily. 12/14/13   Ghimire, Henreitta Leber, MD  metoprolol tartrate (LOPRESSOR) 12.5 mg TABS tablet Take 1 tablet (25 mg total) by mouth 2 (two) times daily. 12/14/13   GhimireHenreitta Leber, MD  Multiple Vitamins-Minerals (MULTIVITAMIN WITH MINERALS) tablet Take 1 tablet by mouth daily.    [provider]  omeprazole (PRILOSEC) 20 MG capsule Take 20 mg by mouth daily.    [provider]  oxyCODONE-acetaminophen (PERCOCET) 10-325 MG tablet Take 1 tablet by mouth 4 (four) times daily as needed for pain. 09/11/19   [provider]    Allergies    Patient has no known allergies.  Review of Systems   Review of Systems  Constitutional: Negative for fever.  HENT: Negative for tinnitus.   Respiratory: Negative for cough.   Cardiovascular: Negative for chest pain and syncope.  Gastrointestinal: Positive for vomiting. Negative for abdominal pain.  Neurological: Positive for  dizziness. Negative for syncope, weakness and headaches.  All other systems reviewed and are negative.   Physical Exam Updated Vital Signs BP 120/64   Pulse 80   Temp 98.6 F (37 C) (Oral)   Resp (!) 21   SpO2 94%   Physical Exam CONSTITUTIONAL: Elderly, no acute distress HEAD: Normocephalic/atraumatic EYES: EOMI/PERRL, no nystagmus, no ptosis ENMT: Mucous membranes moist NECK: supple no meningeal signs CV: S1/S2 noted, no murmurs/rubs/gallops noted LUNGS: Left-sided crackles noted, no acute distress, pulse ox above 95% on room air ABDOMEN: soft, nontender, no rebound or guarding GU:no cva tenderness NEURO:Awake/alert, face symmetric, no arm or leg drift is noted Equal 5/5 strength with shoulder abduction, elbow flex/extension, wrist flex/extension in upper extremities  Equal 5/5 strength with hip flexion,knee flex/extension, foot dorsi/plantar flexion Cranial nerves 3/4/5/6/09/18/08/11/12 tested and intact Sensation to light touch intact in all extremities EXTREMITIES: pulses normal, full ROM SKIN: warm, color normal PSYCH: no abnormalities of mood noted  ED Results / Procedures / Treatments   Labs (all labs ordered are listed, but only abnormal results are displayed) Labs Reviewed  COMPREHENSIVE METABOLIC PANEL - Abnormal; Notable for the following components:      Result Value   Glucose, Bld 139 (*)    Creatinine, Ser 0.60 (*)    Calcium 8.7 (*)    All other components within normal limits  CBC - Abnormal; Notable for the following components:   WBC 17.3 (*)    All other components within normal limits  LIPASE, BLOOD  URINALYSIS, ROUTINE W REFLEX MICROSCOPIC  TROPONIN I (HIGH SENSITIVITY)  TROPONIN I (HIGH SENSITIVITY)    EKG EKG Interpretation  Date/Time:  Saturday September 13 2019 00:37:40 EDT Ventricular Rate:  72 PR Interval:    QRS Duration: 132 QT Interval:  433 QTC Calculation: 474 R Axis:   58 Text Interpretation: Sinus rhythm Probable left atrial  enlargement Right bundle branch block No significant change since last tracing Confirmed by Ripley Fraise [phone]) on 09/13/2019 1:10:42 AM   Radiology DG Chest 2 View  Result Date: 09/12/2019 CLINICAL DATA:  Nausea, vomiting, dizziness, shortness of breath EXAM: CHEST - 2 VIEW COMPARISON:  12/12/2013 FINDINGS: Heart is normal size. Platelike atelectasis in the right upper lobe. Left lung clear. No effusions. No acute bony abnormality. IMPRESSION: Right upper lobe atelectasis. No active disease. Electronically Signed   By: Rolm Baptise M.D.   On: 09/12/2019 22:22    Procedures Procedures   Medications Ordered in ED Medications  sodium chloride 0.9 % bolus 1,000 mL (0 mLs Intravenous Stopped 09/13/19 0251)    ED Course  I have reviewed the triage vital signs and the nursing notes.  Pertinent labs & imaging results that were available during my care of the patient were reviewed by me and considered in my  medical decision making (see chart for details).    MDM Rules/Calculators/A&P                         1:14 AM Patient reports he had sudden onset of dizziness and vomiting.  He reports feeling back to baseline now.  No focal weaknesses suggest stroke. Labs are pending at this time. 3:00 AM Patient has had no further dizziness.  No focal weakness.  He ambulated without any difficulty.  No hypoxia.  Patient never had hypoxia in the ER.  He denies chest pain or shortness of breath.  Denies cough or fever. Unclear cause of his symptoms earlier tonight.  Peripheral vertigo is possibility.  Patient has no signs of acute stroke. 5:10 AM Patient continues to improve.  No new dizziness.  No focal weakness.  No chest pain.  Labs overall reassuring Unclear cause of symptoms, but peripheral vertigo as a potential cause. No indication for CT head as no history of trauma, no active headache no focal weakness. No ataxia noted Patient will be discharged home.  We discussed return precautions   This  patient presents to the ED for concern of dizziness and vomiting, this involves an extensive number of treatment options, and is a complaint that carries with it a high risk of complications and morbidity.  The differential diagnosis includes stroke, vertigo, electrolyte imbalance, dehydration, acute coronary syndrome   Lab Tests:   I Ordered, reviewed, and interpreted labs, which included electrolytes, troponin, complete blood  Medicines ordered:   I ordered medication IV fluids for dizziness  Imaging Studies ordered:   I ordered imaging studies which included chest x-ray   I independently visualized and interpreted imaging which showed atelectasis  Additional history obtained:   Additional history obtained from brother  Previous records obtained and reviewed     Reevaluation:  After the interventions stated above, I reevaluated the patient and found pt is improved    Final Clinical Impression(s) / ED Diagnoses Final diagnoses:  Dizziness    Rx / DC Orders ED Discharge Orders    None       Ripley Fraise, MD 09/13/19 [phone]

## 2020-06-30 ENCOUNTER — Encounter (HOSPITAL_COMMUNITY): Payer: Self-pay | Admitting: Emergency Medicine

## 2020-06-30 ENCOUNTER — Other Ambulatory Visit: Payer: Self-pay

## 2020-06-30 ENCOUNTER — Emergency Department (HOSPITAL_COMMUNITY): Payer: Medicare Other

## 2020-06-30 ENCOUNTER — Inpatient Hospital Stay (HOSPITAL_COMMUNITY)
Admission: EM | Admit: 2020-06-30 | Discharge: 2020-07-15 | DRG: 064 | Disposition: A | Discharge status: Skilled Nursing Facility | Payer: Medicare Other | Attending: Internal Medicine | Admitting: Internal Medicine

## 2020-06-30 DIAGNOSIS — I10 Essential (primary) hypertension: Secondary | ICD-10-CM

## 2020-06-30 DIAGNOSIS — Z419 Encounter for procedure for purposes other than remedying health state, unspecified: Secondary | ICD-10-CM

## 2020-06-30 DIAGNOSIS — D72828 Other elevated white blood cell count: Secondary | ICD-10-CM | POA: Diagnosis present

## 2020-06-30 DIAGNOSIS — G894 Chronic pain syndrome: Secondary | ICD-10-CM | POA: Diagnosis present

## 2020-06-30 DIAGNOSIS — F1010 Alcohol abuse, uncomplicated: Secondary | ICD-10-CM

## 2020-06-30 DIAGNOSIS — C3411 Malignant neoplasm of upper lobe, right bronchus or lung: Secondary | ICD-10-CM | POA: Diagnosis present

## 2020-06-30 DIAGNOSIS — Z7982 Long term (current) use of aspirin: Secondary | ICD-10-CM

## 2020-06-30 DIAGNOSIS — Z791 Long term (current) use of non-steroidal anti-inflammatories (NSAID): Secondary | ICD-10-CM

## 2020-06-30 DIAGNOSIS — Z7289 Other problems related to lifestyle: Secondary | ICD-10-CM

## 2020-06-30 DIAGNOSIS — R918 Other nonspecific abnormal finding of lung field: Secondary | ICD-10-CM

## 2020-06-30 DIAGNOSIS — Z79899 Other long term (current) drug therapy: Secondary | ICD-10-CM

## 2020-06-30 DIAGNOSIS — I330 Acute and subacute infective endocarditis: Secondary | ICD-10-CM | POA: Diagnosis present

## 2020-06-30 DIAGNOSIS — F17210 Nicotine dependence, cigarettes, uncomplicated: Secondary | ICD-10-CM | POA: Diagnosis present

## 2020-06-30 DIAGNOSIS — Z801 Family history of malignant neoplasm of trachea, bronchus and lung: Secondary | ICD-10-CM

## 2020-06-30 DIAGNOSIS — R29810 Facial weakness: Secondary | ICD-10-CM | POA: Diagnosis present

## 2020-06-30 DIAGNOSIS — Z8249 Family history of ischemic heart disease and other diseases of the circulatory system: Secondary | ICD-10-CM

## 2020-06-30 DIAGNOSIS — F419 Anxiety disorder, unspecified: Secondary | ICD-10-CM | POA: Diagnosis present

## 2020-06-30 DIAGNOSIS — I639 Cerebral infarction, unspecified: Secondary | ICD-10-CM | POA: Diagnosis not present

## 2020-06-30 DIAGNOSIS — R0600 Dyspnea, unspecified: Secondary | ICD-10-CM

## 2020-06-30 DIAGNOSIS — Z720 Tobacco use: Secondary | ICD-10-CM

## 2020-06-30 DIAGNOSIS — R29703 NIHSS score 3: Secondary | ICD-10-CM | POA: Diagnosis not present

## 2020-06-30 DIAGNOSIS — R310 Gross hematuria: Secondary | ICD-10-CM | POA: Diagnosis not present

## 2020-06-30 DIAGNOSIS — Z20822 Contact with and (suspected) exposure to covid-19: Secondary | ICD-10-CM | POA: Diagnosis present

## 2020-06-30 DIAGNOSIS — D62 Acute posthemorrhagic anemia: Secondary | ICD-10-CM | POA: Diagnosis present

## 2020-06-30 DIAGNOSIS — D72829 Elevated white blood cell count, unspecified: Secondary | ICD-10-CM

## 2020-06-30 DIAGNOSIS — R4781 Slurred speech: Secondary | ICD-10-CM | POA: Diagnosis present

## 2020-06-30 DIAGNOSIS — R29706 NIHSS score 6: Secondary | ICD-10-CM | POA: Diagnosis not present

## 2020-06-30 DIAGNOSIS — R29702 NIHSS score 2: Secondary | ICD-10-CM | POA: Diagnosis present

## 2020-06-30 DIAGNOSIS — Z8546 Personal history of malignant neoplasm of prostate: Secondary | ICD-10-CM

## 2020-06-30 DIAGNOSIS — I422 Other hypertrophic cardiomyopathy: Secondary | ICD-10-CM | POA: Diagnosis present

## 2020-06-30 DIAGNOSIS — Z789 Other specified health status: Secondary | ICD-10-CM

## 2020-06-30 DIAGNOSIS — I4510 Unspecified right bundle-branch block: Secondary | ICD-10-CM | POA: Diagnosis present

## 2020-06-30 HISTORY — DX: Headache, unspecified: R51.9

## 2020-06-30 HISTORY — DX: Gastro-esophageal reflux disease without esophagitis: K21.9

## 2020-06-30 HISTORY — DX: Dyspnea, unspecified: R06.00

## 2020-06-30 HISTORY — DX: Anxiety disorder, unspecified: F41.9

## 2020-06-30 LAB — I-STAT CHEM 8, ED
BUN: 15 mg/dL (ref 8–23)
Calcium, Ion: 1.03 mmol/L — ABNORMAL LOW (ref 1.15–1.40)
Chloride: 101 mmol/L (ref 98–111)
Creatinine, Ser: 0.5 mg/dL — ABNORMAL LOW (ref 0.61–1.24)
Glucose, Bld: 93 mg/dL (ref 70–99)
HCT: 41 % (ref 39.0–52.0)
Hemoglobin: 13.9 g/dL (ref 13.0–17.0)
Potassium: 3.9 mmol/L (ref 3.5–5.1)
Sodium: 136 mmol/L (ref 135–145)
TCO2: 25 mmol/L (ref 22–32)

## 2020-06-30 LAB — COMPREHENSIVE METABOLIC PANEL
ALT: 33 U/L (ref 0–44)
AST: 118 U/L — ABNORMAL HIGH (ref 15–41)
Albumin: 3.4 g/dL — ABNORMAL LOW (ref 3.5–5.0)
Alkaline Phosphatase: 79 U/L (ref 38–126)
Anion gap: 11 (ref 5–15)
BUN: 11 mg/dL (ref 8–23)
CO2: 23 mmol/L (ref 22–32)
Calcium: 9 mg/dL (ref 8.9–10.3)
Chloride: 100 mmol/L (ref 98–111)
Creatinine, Ser: 0.58 mg/dL — ABNORMAL LOW (ref 0.61–1.24)
GFR, Estimated: >60 mL/min (ref >60)
Glucose, Bld: 94 mg/dL (ref 70–99)
Potassium: 3.4 mmol/L — ABNORMAL LOW (ref 3.5–5.1)
Sodium: 134 mmol/L — ABNORMAL LOW (ref 135–145)
Total Bilirubin: 0.8 mg/dL (ref 0.3–1.2)
Total Protein: 6.3 g/dL — ABNORMAL LOW (ref 6.5–8.1)

## 2020-06-30 LAB — CBC WITH DIFFERENTIAL/PLATELET
Abs Immature Granulocytes: 0.03 10*3/uL (ref 0.00–0.07)
Basophils Absolute: 0 10*3/uL (ref 0.0–0.1)
Basophils Relative: 0 %
Eosinophils Absolute: 0 10*3/uL (ref 0.0–0.5)
Eosinophils Relative: 0 %
HCT: 43.1 % (ref 39.0–52.0)
Hemoglobin: 14.2 g/dL (ref 13.0–17.0)
Immature Granulocytes: 0 %
Lymphocytes Relative: 12 %
Lymphs Abs: 1.6 10*3/uL (ref 0.7–4.0)
MCH: 31.3 pg (ref 26.0–34.0)
MCHC: 32.9 g/dL (ref 30.0–36.0)
MCV: 95.1 fL (ref 80.0–100.0)
Monocytes Absolute: 0.8 10*3/uL (ref 0.1–1.0)
Monocytes Relative: 7 %
Neutro Abs: 10.2 10*3/uL — ABNORMAL HIGH (ref 1.7–7.7)
Neutrophils Relative %: 81 %
Platelets: 291 10*3/uL (ref 150–400)
RBC: 4.53 MIL/uL (ref 4.22–5.81)
RDW: 12.5 % (ref 11.5–15.5)
WBC: 12.7 10*3/uL — ABNORMAL HIGH (ref 4.0–10.5)
nRBC: 0 % (ref 0.0–0.2)

## 2020-06-30 LAB — RESP PANEL BY RT-PCR (FLU A&B, COVID) ARPGX2
Influenza A by PCR: NEGATIVE
Influenza B by PCR: NEGATIVE
SARS Coronavirus 2 by RT PCR: NEGATIVE

## 2020-06-30 LAB — MAGNESIUM: Magnesium: 1.9 mg/dL (ref 1.7–2.4)

## 2020-06-30 LAB — CK: Total CK: 6158 U/L — ABNORMAL HIGH (ref 49–397)

## 2020-06-30 IMAGING — MR MR HEAD W/O CM
7 of 11 series · 25 of 48 positions shown · non-contrast
Comparison: None.

CLINICAL DATA: Transient ischemic attack.

EXAM:
MRI HEAD WITHOUT CONTRAST
TECHNIQUE: Multiplanar, multiecho pulse sequences of the brain and surrounding
structures were obtained without intravenous contrast.

[Series 2: DWI · axial · 3.0mm · 0.94mm/px · z∈[-82,+67]mm · 7 of 104 slices shown (1 of 2)]
[im 1/104]
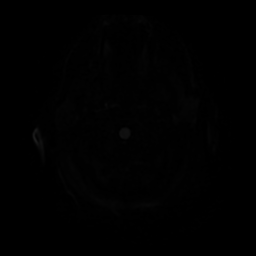
[im 18/104]
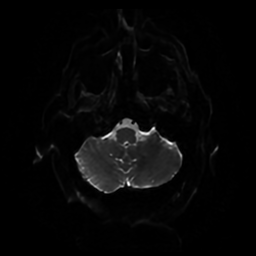
[im 35/104]
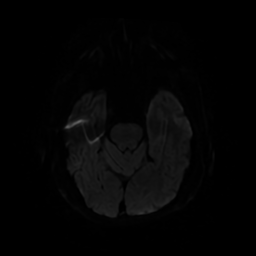
[im 52/104]
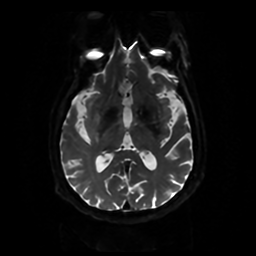
[im 69/104]
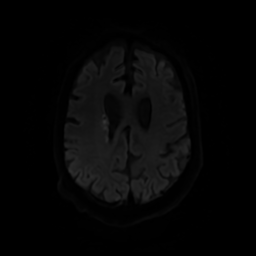
[im 86/104]
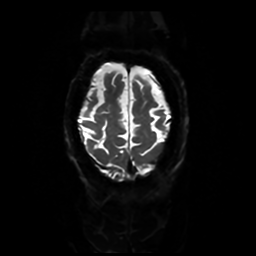
[im 104/104]
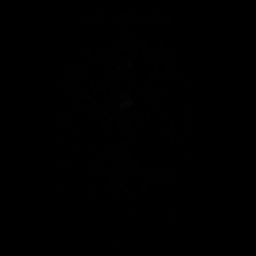

[Series 3: DWI · coronal · 4.0mm · 0.94mm/px · 6 of 74 slices shown (2 of 2)]
[im 1/74]
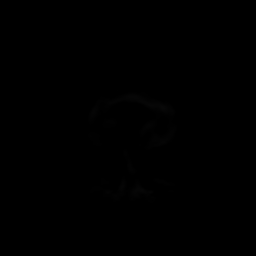
[im 15/74]
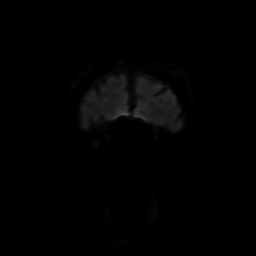
[im 30/74]
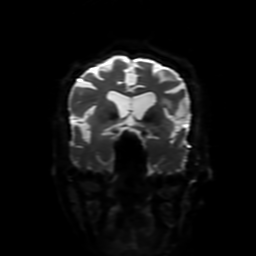
[im 44/74]
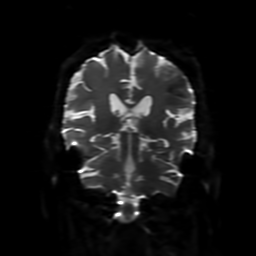
[im 59/74]
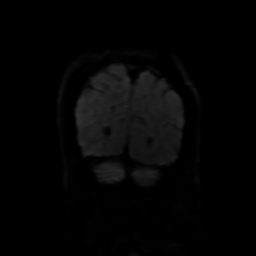
[im 74/74]
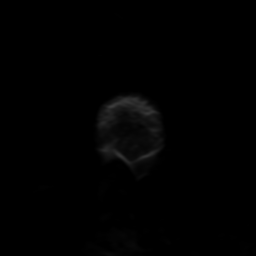

[Series 4: FLAIR · sagittal · 5.0mm · 0.23mm/px · 2 of 26 slices shown (1 of 2)]
[im 1/26]
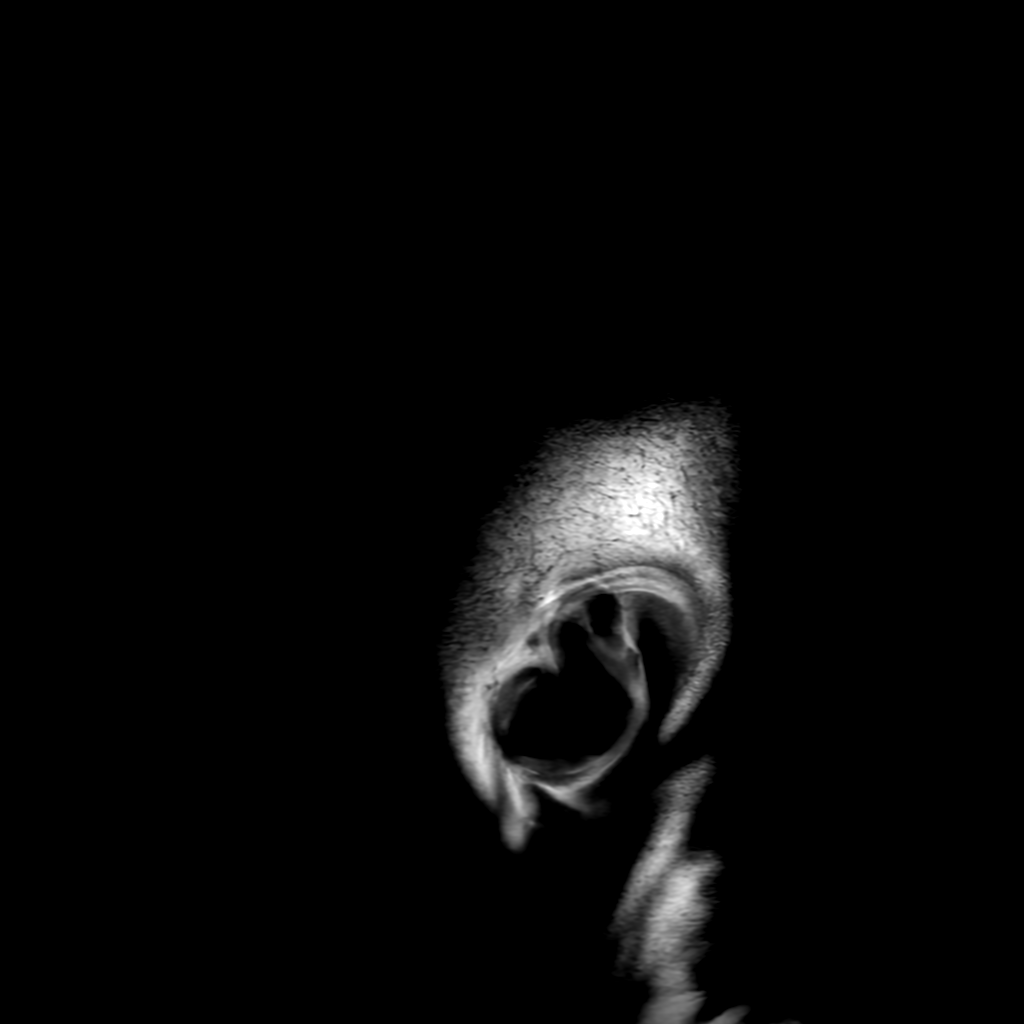
[im 26/26]
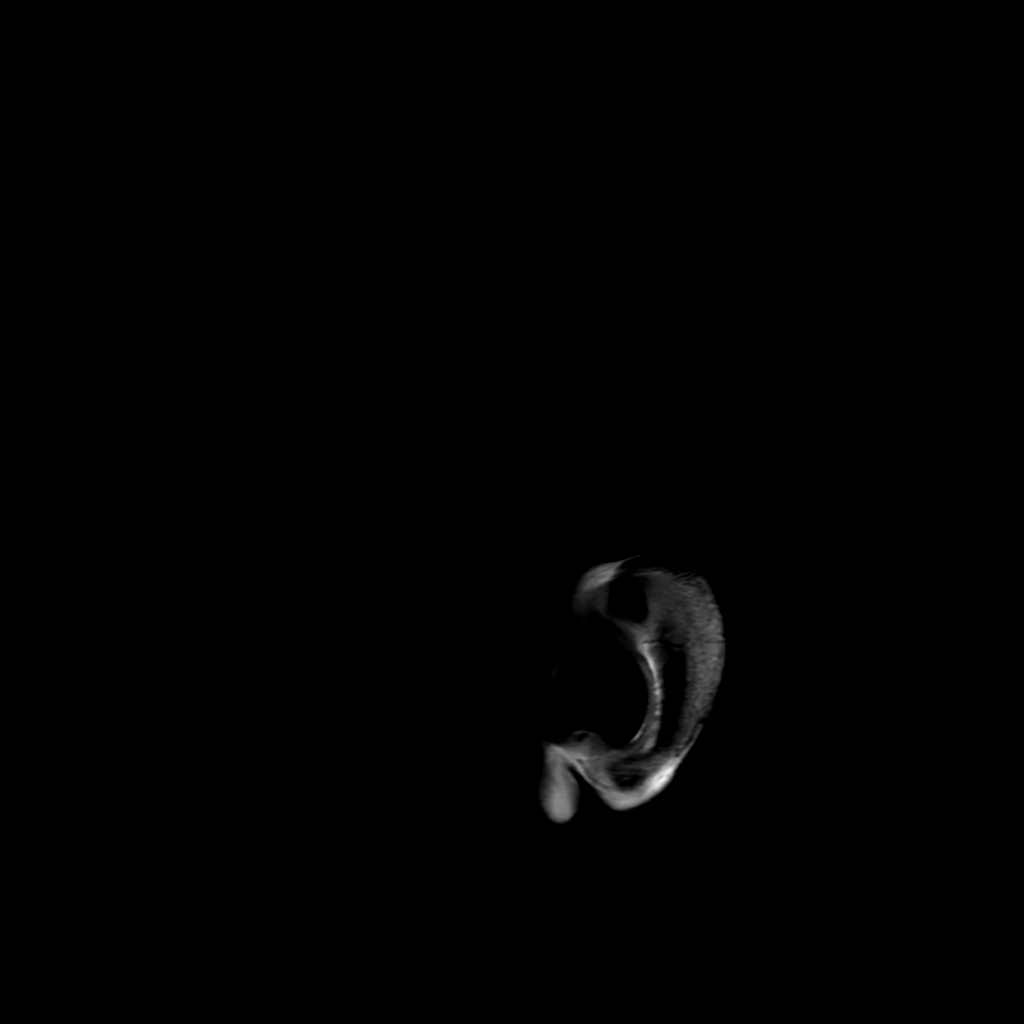

[Series 5: T2 · axial · 5.0mm · 0.23mm/px · 1 of 26 slices shown]
[im 1/26]
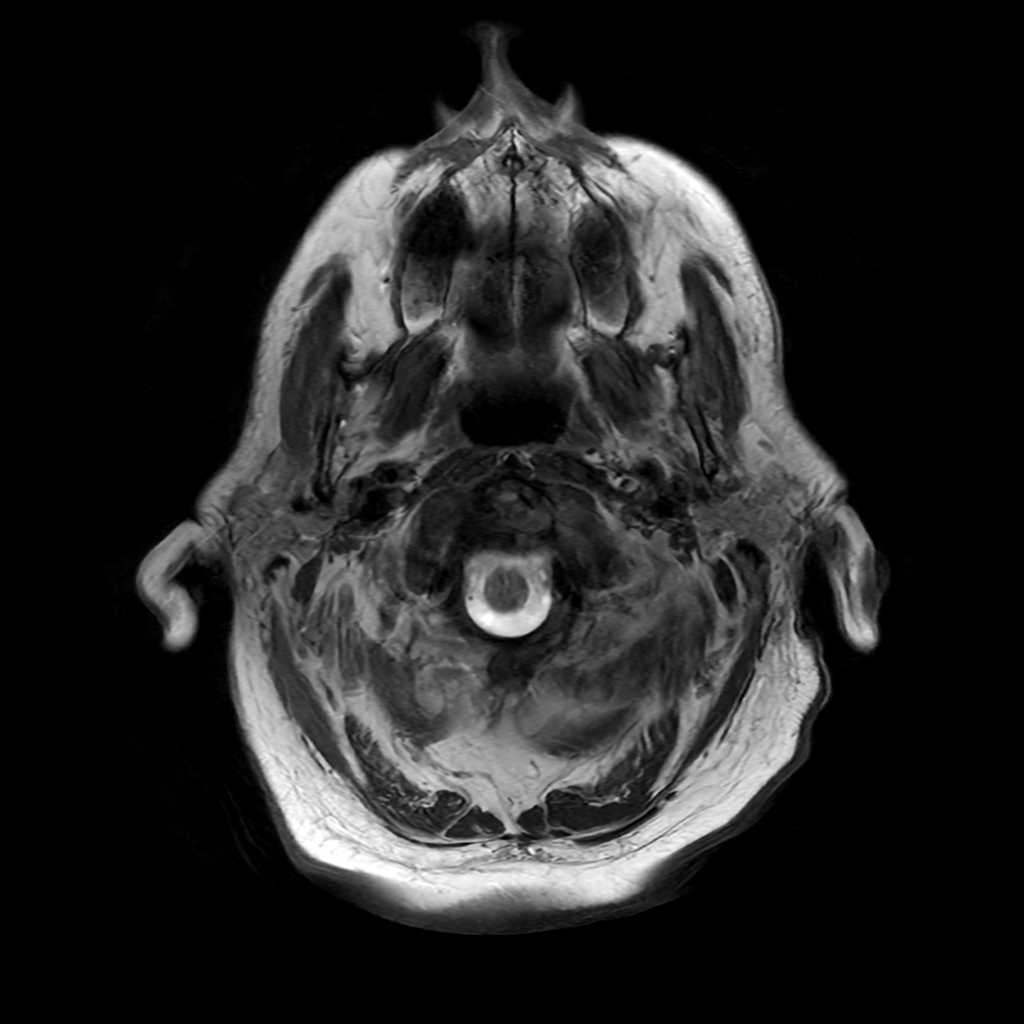

[Series 6: FLAIR · axial · 3.0mm · 0.45mm/px · z∈[-70,+78]mm · 2 of 26 slices shown (2 of 2)]
[im 1/26]
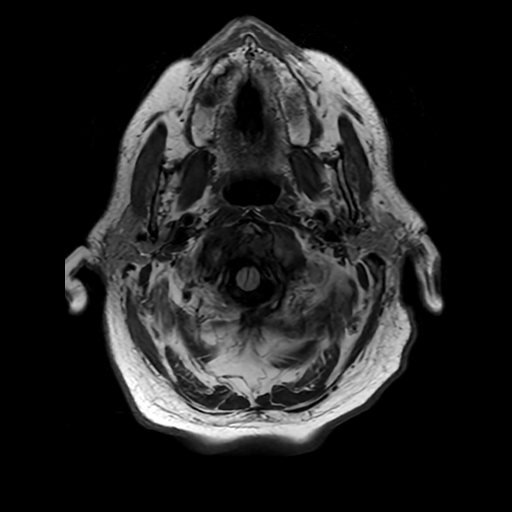
[im 26/26]
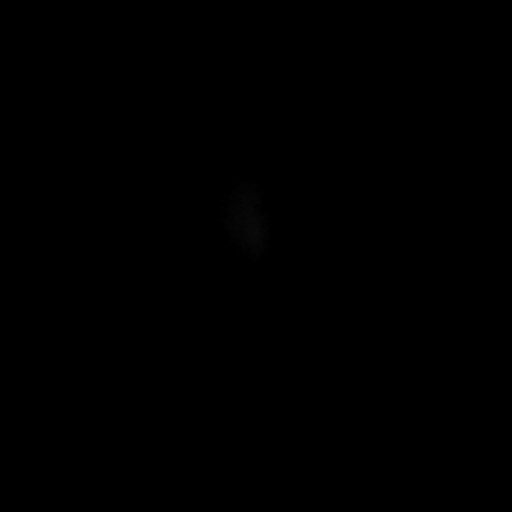

[Series 250: ADC · axial · 3.0mm · 0.94mm/px · z∈[-82,+67]mm · 4 of 51 slices shown (1 of 2)]
[im 1/51]
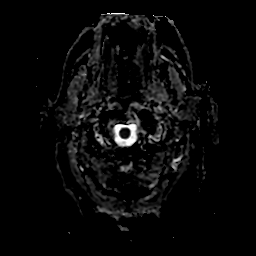
[im 17/51]
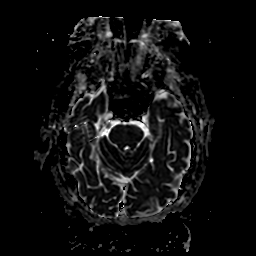
[im 34/51]
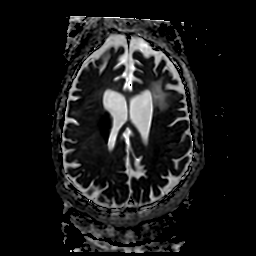
[im 51/51]
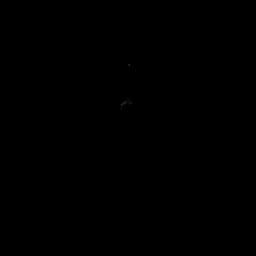

[Series 350: ADC · coronal · 4.0mm · 0.94mm/px · 3 of 36 slices shown (2 of 2)]
[im 1/36]
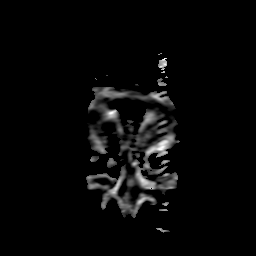
[im 18/36]
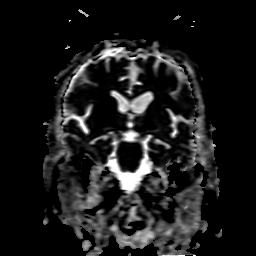
[im 36/36]
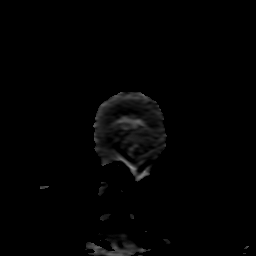

[25 of 48 positions shown; findings below may reference images not displayed]

FINDINGS: Brain: Diffusion imaging shows acute infarction within the external
capsule and caudate body on the right. No evidence of mass effect or
hemorrhage. No other acute insult. Elsewhere, the brainstem is
normal. Old small vessel cerebellar infarctions inferior on the
left. Old left frontal cortical and subcortical infarction. No sign
of mass lesion, hemorrhage, hydrocephalus or extra-axial collection.

Vascular: Major vessels at the base of the brain show flow.

Skull and upper cervical spine: Negative

Sinuses/Orbits: Clear/normal

Other: None
IMPRESSION: 1. Acute infarction affecting the external capsule and caudate body
on the right. No mass effect or hemorrhage.
2. Old left frontal cortical and subcortical infarction.

## 2020-06-30 MED ORDER — SODIUM CHLORIDE 0.9 % IV BOLUS
1000.0000 mL | Freq: Once | INTRAVENOUS | Status: AC
  Administered 2020-06-30: 1000 mL via INTRAVENOUS

## 2020-06-30 MED ORDER — OXYCODONE HCL 5 MG PO TABS
10.0000 mg | ORAL_TABLET | Freq: Four times a day (QID) | ORAL | Status: DC | PRN
  Administered 2020-07-01 – 2020-07-15 (×19): 10 mg via ORAL
  Filled 2020-06-30 (×19): qty 2

## 2020-06-30 MED ORDER — PANTOPRAZOLE SODIUM 40 MG PO TBEC
40.0000 mg | DELAYED_RELEASE_TABLET | Freq: Every day | ORAL | Status: DC
  Administered 2020-07-01 – 2020-07-15 (×14): 40 mg via ORAL
  Filled 2020-06-30 (×14): qty 1

## 2020-06-30 MED ORDER — ENOXAPARIN SODIUM 40 MG/0.4ML ~~LOC~~ SOLN
40.0000 mg | SUBCUTANEOUS | Status: DC
  Administered 2020-07-01 – 2020-07-04 (×4): 40 mg via SUBCUTANEOUS
  Filled 2020-06-30 (×4): qty 0.4

## 2020-06-30 MED ORDER — ALPRAZOLAM 0.25 MG PO TABS: 0.2500 mg | ORAL_TABLET | Freq: Two times a day (BID) | ORAL | Status: DC | PRN

## 2020-06-30 MED ORDER — ACETAMINOPHEN 160 MG/5ML PO SOLN: 650.0000 mg | ORAL | Status: DC | PRN

## 2020-06-30 MED ORDER — ASPIRIN 81 MG PO CHEW
81.0000 mg | CHEWABLE_TABLET | Freq: Every day | ORAL | Status: DC
  Administered 2020-07-01: 81 mg via ORAL
  Filled 2020-06-30: qty 1

## 2020-06-30 MED ORDER — ACETAMINOPHEN 325 MG PO TABS: 325.0000 mg | ORAL_TABLET | Freq: Four times a day (QID) | ORAL | Status: DC | PRN

## 2020-06-30 MED ORDER — STROKE: EARLY STAGES OF RECOVERY BOOK
Freq: Once | Status: DC
  Filled 2020-06-30: qty 1

## 2020-06-30 MED ORDER — ACETAMINOPHEN 650 MG RE SUPP: 650.0000 mg | RECTAL | Status: DC | PRN

## 2020-06-30 MED ORDER — SENNOSIDES-DOCUSATE SODIUM 8.6-50 MG PO TABS
1.0000 | ORAL_TABLET | Freq: Every evening | ORAL | Status: DC | PRN
  Administered 2020-07-01 – 2020-07-09 (×2): 1 via ORAL
  Filled 2020-06-30 (×2): qty 1

## 2020-06-30 MED ORDER — OXYCODONE-ACETAMINOPHEN 10-325 MG PO TABS
1.0000 | ORAL_TABLET | Freq: Four times a day (QID) | ORAL | Status: DC | PRN
Start: 2020-06-30 — End: 2020-06-30

## 2020-06-30 MED ORDER — ALBUTEROL SULFATE HFA 108 (90 BASE) MCG/ACT IN AERS
2.0000 | INHALATION_SPRAY | Freq: Four times a day (QID) | RESPIRATORY_TRACT | Status: DC | PRN
  Filled 2020-06-30: qty 6.7

## 2020-06-30 MED ORDER — ACETAMINOPHEN 325 MG PO TABS
650.0000 mg | ORAL_TABLET | ORAL | Status: DC | PRN
  Administered 2020-07-05 – 2020-07-12 (×5): 650 mg via ORAL
  Filled 2020-06-30 (×5): qty 2

## 2020-06-30 NOTE — ED Triage Notes (Signed)
Pt BIB [facility] from home, pt lives alone, LKW 10am today. Family checked on pt, found him with slurred speech, left facial droop. EMS VS: SBP 160, HR 80, CBG 120

## 2020-06-30 NOTE — ED Notes (Signed)
Patient transported to MRI 

## 2020-06-30 NOTE — H&P (Addendum)
History and Physical   Cody Douglas YKD:[id number] DOB: 07-Jun-1950 DOA: 06/30/2020  PCP: Tamsen Roers, MD   Patient coming from: Home  Chief Complaint: Slurred speech, facial droop  HPI: Cody Douglas is a 70 y.o. male with medical history significant of hypertrophic cardiomyopathy, hyponatremia, hypokalemia, chest pain, tobacco use, hypertension, prostate cancer presents following episode of slurred speech and facial droop.  Mild left facial droop.  Slurred speech.  History obtained with assistance of chart review and patient's brother.  Patient's last known well was around 10 AM.  His family went to check on him and noted he had slurred speech and left facial droop and they subsequently called EMS.  He states that he remember feeling unwell and unsteady on his feet with some left-sided weakness and so he laid down.  He states that he took a couple shots of vodka this morning. He states he will drinks about fifth of liquor during the a day, but only does this about 1 day a week.   He denies fevers, chills, chest pain, shortness of breath, abdominal pain, constipation, diarrhea, nausea, and vomiting.  ED Course: Vital signs in the ED significant for respiration in the teens to 20s.  Lab work-up is pending including CMP.  CBC showed leukocytosis to 12.7 which appears to be chronic.  Respiratory panel for flu and COVID is negative.  CK and ethanol level are pending.  MRI brain showed acute infarct at the external capsule and caudate.  Also noted were old left-sided infarcts.  Neurology was consulted who will see the patient.  Review of Systems: As per HPI otherwise all other systems reviewed and are negative.  Past Medical History:  Diagnosis Date  . Hypertension   . Prostate cancer [facility])     Past Surgical History:  Procedure Laterality Date  . EYE SURGERY    . PROSTATE BIOPSY     Prostate surgery 8-9 yrs ago    Social History  reports that he has been smoking cigarettes. He has a  100.00 pack-year smoking history. He does not have any smokeless tobacco history on file. He reports current alcohol use of about 1.0 standard drink of alcohol per week. He reports that he does not use drugs.  No Known Allergies  Family History  Problem Relation Age of Onset  . Cancer - Lung Father   . Heart disease Brother   . Diabetes type II Neg Hx   . Stroke Neg Hx   Reviewed on admission  Prior to Admission medications   Medication Sig Start Date End Date Taking? Authorizing Provider  albuterol (PROVENTIL HFA;VENTOLIN HFA) 108 (90 BASE) MCG/ACT inhaler Inhale 2 puffs into the lungs every 6 (six) hours as needed for wheezing or shortness of breath. 12/14/13   Ghimire, Henreitta Leber, MD  ALPRAZolam Duanne Moron) 0.25 MG tablet Take 0.25 mg by mouth 3 (three) times daily. 09/10/19   [provider]  aspirin 81 MG chewable tablet Chew 1 tablet (81 mg total) by mouth daily. 12/14/13   Ghimire, Henreitta Leber, MD  cyclobenzaprine (FLEXERIL) 5 MG tablet Take 5 mg by mouth 2 (two) times daily. 08/14/19   [provider]  lisinopril-hydrochlorothiazide (ZESTORETIC) 10-12.5 MG tablet Take 1 tablet by mouth daily. 07/12/19   [provider]  meloxicam (MOBIC) 7.5 MG tablet Take 7.5 mg by mouth daily. 08/15/19   [provider]  metoprolol tartrate (LOPRESSOR) 12.5 mg TABS tablet Take 1 tablet (25 mg total) by mouth 2 (two) times daily. 12/14/13  Ghimire, Henreitta Leber, MD  Multiple Vitamins-Minerals (MULTIVITAMIN WITH MINERALS) tablet Take 1 tablet by mouth daily.    [provider]  omeprazole (PRILOSEC) 20 MG capsule Take 20 mg by mouth daily.    [provider]  oxyCODONE-acetaminophen (PERCOCET) 10-325 MG tablet Take 1 tablet by mouth 4 (four) times daily as needed for pain. 09/11/19   [provider]    Physical Exam: Vitals:   06/30/20 2115 06/30/20 2130 06/30/20 2200 06/30/20 2212  BP: 133/76 (!) 145/75 137/81   Pulse:  (!) 106 (!) 44   Resp: (!) 24  (!) 22 (!) 23   Temp:    98.5 F (36.9 C)  TempSrc:    Oral  SpO2:  93% 97%    Physical Exam Constitutional:      General: He is not in acute distress.    Appearance: Normal appearance.  HENT:     Head: Normocephalic and atraumatic.     Mouth/Throat:     Mouth: Mucous membranes are moist.     Pharynx: Oropharynx is clear.  Eyes:     Extraocular Movements: Extraocular movements intact.     Pupils: Pupils are equal, round, and reactive to light.  Cardiovascular:     Rate and Rhythm: Normal rate and regular rhythm.     Pulses: Normal pulses.     Heart sounds: Normal heart sounds.  Pulmonary:     Effort: Pulmonary effort is normal. No respiratory distress.     Breath sounds: Normal breath sounds.  Abdominal:     General: Bowel sounds are normal. There is no distension.     Palpations: Abdomen is soft.     Tenderness: There is no abdominal tenderness.  Musculoskeletal:        General: No swelling or deformity.  Skin:    General: Skin is warm and dry.  Neurological:     Comments: Mental Status: Patient is awake, alert, oriented x3 No signs of aphasia or neglect Cranial Nerves: II: Pupils equal, round, and reactive to light.  III,IV, VI: EOMI without ptosis or diploplia.  V: Facial sensation is symmetric tolight touch andTemperature. VII: Mild left facial mild left facial droop.  Slurred speech. VIII: hearing is intact to voice X: Uvula elevates symmetrically XI: Shoulder shrug is symmetric. XII: tongue is midline without atrophy or fasciculations.  Motor: Good effort thorughout, 4-5 out of 5 left upper and lower extremity strength.  5 out of 5 right upper and lower extremity strength. Sensory: Sensation is grossly intact bilateral UEs & LEs     Labs on Admission: I have personally reviewed following labs and imaging studies  CBC: Recent Labs  Lab 06/30/20 1905 06/30/20 1938  WBC 12.7*  --   NEUTROABS 10.2*  --   HGB 14.2 13.9  HCT 43.1 41.0  MCV 95.1  --    PLT 291  --     Basic Metabolic Panel: Recent Labs  Lab 06/30/20 1938  NA 136  K 3.9  CL 101  GLUCOSE 93  BUN 15  CREATININE 0.50*    GFR: CrCl cannot be calculated (Unknown ideal weight.).  Liver Function Tests: No results for input(s): AST, ALT, ALKPHOS, BILITOT, PROT, ALBUMIN in the last 168 hours.  Urine analysis:    Component Value Date/Time   COLORURINE YELLOW 09/13/2019 0040   APPEARANCEUR CLEAR 09/13/2019 0040   LABSPEC 1.023 09/13/2019 0040   PHURINE 5.0 09/13/2019 0040   GLUCOSEU NEGATIVE 09/13/2019 0040   HGBUR NEGATIVE 09/13/2019 0040  BILIRUBINUR NEGATIVE 09/13/2019 0040   KETONESUR NEGATIVE 09/13/2019 0040   PROTEINUR NEGATIVE 09/13/2019 0040   UROBILINOGEN 2.0 (H) 12/13/2013 0738   NITRITE NEGATIVE 09/13/2019 0040   LEUKOCYTESUR NEGATIVE 09/13/2019 0040    Radiological Exams on Admission: MR BRAIN WO CONTRAST  Result Date: 06/30/2020 CLINICAL DATA:  Transient ischemic attack. EXAM: MRI HEAD WITHOUT CONTRAST TECHNIQUE: Multiplanar, multiecho pulse sequences of the brain and surrounding structures were obtained without intravenous contrast. COMPARISON:  None. FINDINGS: Brain: Diffusion imaging shows acute infarction within the external capsule and caudate body on the right. No evidence of mass effect or hemorrhage. No other acute insult. Elsewhere, the brainstem is normal. Old small vessel cerebellar infarctions inferior on the left. 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 No sign of mass lesion, hemorrhage, hydrocephalus or extra-axial collection. Vascular: Major vessels at the base of the brain show flow. Skull and upper cervical spine: Negative Sinuses/Orbits: Clear/normal Other: None IMPRESSION: 1. Acute infarction affecting the external capsule and caudate body on the right. No mass effect or hemorrhage. 2. 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 Electronically Signed   By: Nelson Chimes M.D.   On: 06/30/2020 20:30   EKG:  Unable to be reviewed due to technical difficulties with EMR.  Assessment/Plan Principal Problem:   Acute CVA (cerebrovascular accident) ([place]) Active Problems:   Tobacco abuse   Essential hypertension   Alcohol use   Leukocytosis  Acute CVA > EMS called by patient's family due to noticing slurred speech and facial droop.  > Risk factors of hypertension, tobacco use, alcohol use. > Neurology consulted in ED - Appreciate neurology recommendations - Allow for permissive HTN (systolic < 952 and diastolic < 841)  - ASA 324 mg / 81 mg daily  - Start atorvastatin - Echocardiogram  - Carotid doppler - A1C  - Lipid panel  - Tele monitoring  - SLP eval - PT/OT  Leukocytosis > WBC elevated to 12.7 on presentation.  On chart review this may be chronic.  Acute stroke may also be playing a role. > Currently no signs or symptoms of infection. - We will continue to monitor  Alcohol use > States he drinks about 1/5 of liquor per week, but will typically drink the whole bottle in a day.   - CIWA without Ativan   Hypertension - Hold antihypertensives in the setting of permissive hypertension  Chronic pain - Continue home Percocet  Anxiety - Continue home as needed Ativan  History of prostate cancer - Noted  DVT prophylaxis: Lovenox  Code Status:   Full  Family Communication:  Brother updated at bedside. Disposition Plan:   Patient is from:  Home  Anticipated DC to:  Home  Anticipated DC date:  1 to 2 days  Anticipated DC barriers: None  Consults called:  Neurology consulted by EDP Admission status:  Observation, telemetry   Severity of Illness: The appropriate patient status for this patient is OBSERVATION. Observation status is judged to be reasonable and necessary in order to provide the required intensity of service to ensure the patient's safety. The patient's presenting symptoms, physical exam findings, and initial radiographic and laboratory data in the context of their  medical condition is felt to place them at decreased risk for further clinical deterioration. Furthermore, it is anticipated that the patient will be medically stable for discharge from the hospital within 2 midnights of admission. The following factors support the patient status of observation.   " The patient's presenting symptoms include slurred speech. "  The physical exam findings include mild left-sided weakness.  Slurred speech.  Mild facial droop. " The initial radiographic and laboratory data are MR brain with acute infarct at the external capsule and caudate.  Also with old left-sided infarct.  Leukocytosis 12.7.Marland Kitchen   Marcelyn Bruins MD [facility]  How to contact the [facility] Attending or Consulting provider [place] or covering provider during after hours [place], for this patient?   1. Check the care team in [facility] and look for a) attending/consulting TRH provider listed and b) the [facility] team listed 2. Log into [email or web address] and use [place]'s universal password to access. If you do not have the password, please contact the hospital operator. 3. Locate the [facility] provider you are looking for under [facility] and page to a number that you can be directly reached. 4. If you still have difficulty reaching the provider, please page the [facility] (Director on Call) for the Hospitalists listed on amion for assistance.  06/30/2020, 10:48 PM

## 2020-06-30 NOTE — ED Provider Notes (Signed)
[facility]ERG[facility]Y DEPARTMENT Provider Note   CSN: [id number] Arrival date & time: 06/30/20  1820     History Chief Complaint  Patient presents with  . Facial Droop    Cody Douglas is a 70 y.o. male.  LKN 10am Found laying on his floor by a family member Per EMS, they thought that he was having slurred speech and possible left-sided facial droop and called EMS Patient reports that he was feeling unsteady on his feet and overall "unwell" so he laid down He states that he took 2 shots of vodka this morning Denies chest pain, shortness of breath Patient states that he does not think that he is speaking abnormally Denies focal weakness, numbness, tingling  Patient reports that he drinks about 1/5 of alcohol a day, usually vodka Reports smoking 2 packs/day Denies illicit drug use         Past Medical History:  Diagnosis Date  . Hypertension   . Prostate cancer [facility])     Patient Active Problem List   Diagnosis Date Noted  . Hypertrophic cardiomyopathy ([place]) 12/14/2013  . Abnormal ECG 12/13/2013  . CAP (community acquired pneumonia) 12/13/2013  . Chest pain 12/12/2013  . Hyponatremia 12/12/2013  . Hypokalemia 12/12/2013  . Tobacco abuse 12/12/2013    Past Surgical History:  Procedure Laterality Date  . EYE SURGERY    . PROSTATE BIOPSY     Prostate surgery 8-9 yrs ago       Family History  Problem Relation Age of Onset  . Cancer - Lung Father   . Heart disease Brother   . Diabetes type II Neg Hx   . Stroke Neg Hx     Social History   Tobacco Use  . Smoking status: Current Every Day Smoker    Packs/day: 2.50    Years: 40.00    Pack years: 100.00    Types: Cigarettes  Substance Use Topics  . Alcohol use: Yes    Alcohol/week: 1.0 standard drink    Types: 1 Cans of beer per week    Comment: couple of days a week  . Drug use: No    Home Medications Prior to Admission medications   Medication Sig Start Date End Date  Taking? Authorizing Provider  albuterol (PROVENTIL HFA;VENTOLIN HFA) 108 (90 BASE) MCG/ACT inhaler Inhale 2 puffs into the lungs every 6 (six) hours as needed for wheezing or shortness of breath. 12/14/13   Ghimire, Henreitta Leber, MD  ALPRAZolam Duanne Moron) 0.25 MG tablet Take 0.25 mg by mouth 3 (three) times daily. 09/10/19   [provider]  aspirin 81 MG chewable tablet Chew 1 tablet (81 mg total) by mouth daily. 12/14/13   Ghimire, Henreitta Leber, MD  cyclobenzaprine (FLEXERIL) 5 MG tablet Take 5 mg by mouth 2 (two) times daily. 08/14/19   [provider]  lisinopril-hydrochlorothiazide (ZESTORETIC) 10-12.5 MG tablet Take 1 tablet by mouth daily. 07/12/19   [provider]  meloxicam (MOBIC) 7.5 MG tablet Take 7.5 mg by mouth daily. 08/15/19   [provider]  metoprolol tartrate (LOPRESSOR) 12.5 mg TABS tablet Take 1 tablet (25 mg total) by mouth 2 (two) times daily. 12/14/13   Ghimire, Henreitta Leber, MD  Multiple Vitamins-Minerals (MULTIVITAMIN WITH MINERALS) tablet Take 1 tablet by mouth daily.    [provider]  omeprazole (PRILOSEC) 20 MG capsule Take 20 mg by mouth daily.    [provider]  oxyCODONE-acetaminophen (PERCOCET) 10-325 MG tablet Take 1 tablet by mouth 4 (  four) times daily as needed for pain. 09/11/19   [provider]    Allergies    Patient has no known allergies.  Review of Systems   Review of Systems  Constitutional: Positive for fatigue. Negative for diaphoresis and fever.  HENT: Negative for congestion and sore throat.   Eyes: Negative for visual disturbance.  Respiratory: Negative for cough, chest tightness and shortness of breath.   Cardiovascular: Negative for chest pain and leg swelling.  Gastrointestinal: Negative for abdominal pain, diarrhea, nausea and vomiting.  Genitourinary: Negative for difficulty urinating, dysuria and hematuria.  Musculoskeletal: Positive for gait problem (reports difficulty walking). Negative for  arthralgias and neck pain.  Skin: Negative for rash.  Neurological: Positive for facial asymmetry (per EMS report), speech difficulty (patient denies, per EMS report) and weakness (generalized). Negative for dizziness, syncope, light-headedness, numbness and headaches.    Physical Exam Updated Vital Signs BP 137/81   Pulse (!) 44   Temp 98.5 F (36.9 C) (Oral)   Resp (!) 23   SpO2 97%   Physical Exam Constitutional:      General: He is not in acute distress.    Appearance: Normal appearance. He is not ill-appearing, toxic-appearing or diaphoretic.  HENT:     Head: Normocephalic and atraumatic.     Mouth/Throat:     Mouth: Mucous membranes are dry.  Eyes:     General: No visual field deficit.    Extraocular Movements: Extraocular movements intact.     Conjunctiva/sclera: Conjunctivae normal.     Pupils: Pupils are equal, round, and reactive to light.  Cardiovascular:     Rate and Rhythm: Normal rate and regular rhythm.     Heart sounds: No murmur heard. No friction rub. No gallop.   Pulmonary:     Effort: Pulmonary effort is normal. No respiratory distress.     Breath sounds: Normal breath sounds. No wheezing, rhonchi or rales.  Abdominal:     General: Abdomen is flat.     Palpations: Abdomen is soft.     Tenderness: There is no abdominal tenderness. There is no guarding or rebound.  Musculoskeletal:     Cervical back: Normal range of motion and neck supple. No tenderness.     Right lower leg: No edema.     Left lower leg: No edema.  Lymphadenopathy:     Cervical: No cervical adenopathy.  Skin:    General: Skin is warm and dry.  Neurological:     Mental Status: He is alert and oriented to person, place, and time.     GCS: GCS eye subscore is 4. GCS verbal subscore is 5. GCS motor subscore is 6.     Cranial Nerves: Dysarthria (slightly slurred speech and slow to respond, patient denies change) and facial asymmetry (left sided droop, but improves with smiling and  symmetric) present.     Sensory: Sensation is intact.     Motor: No weakness or tremor.     Coordination: Finger-Nose-Finger Test and Heel to [employer] normal.     ED Results / Procedures / Treatments   Labs (all labs ordered are listed, but only abnormal results are displayed) Labs Reviewed  CBC WITH DIFFERENTIAL/PLATELET - Abnormal; Notable for the following components:      Result Value   WBC 12.7 (*)    Neutro Abs 10.2 (*)    All other components within normal limits  I-STAT CHEM 8, ED - Abnormal; Notable for the following components:   Creatinine, Ser 0.50 (*)  Calcium, Ion 1.03 (*)    All other components within normal limits  RESP PANEL BY RT-PCR (FLU A&B, COVID) ARPGX2  ETHANOL  CK  COMPREHENSIVE METABOLIC PANEL  MAGNESIUM    EKG EKG Interpretation  Date/Time:  Wednesday June 30 2020 19:46:23 EDT Ventricular Rate:  83 PR Interval:  151 QRS Duration: 133 QT Interval:  411 QTC Calculation: 483 R Axis:   -18 Text Interpretation: Sinus rhythm Right bundle branch block Minimal ST elevation, inferior leads No significant change since last tracing Confirmed by Wandra Arthurs [phone]) on 06/30/2020 8:04:40 PM   Radiology MR BRAIN WO CONTRAST  Result Date: 06/30/2020 CLINICAL DATA:  Transient ischemic attack. EXAM: MRI HEAD WITHOUT CONTRAST TECHNIQUE: Multiplanar, multiecho pulse sequences of the brain and surrounding structures were obtained without intravenous contrast. COMPARISON:  None. FINDINGS: Brain: Diffusion imaging shows acute infarction within the external capsule and caudate body on the right. No evidence of mass effect or hemorrhage. No other acute insult. Elsewhere, the brainstem is normal. Old small vessel cerebellar infarctions inferior on the left. 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 No sign of mass lesion, hemorrhage, hydrocephalus or extra-axial collection. Vascular: Major vessels at the base of the brain show flow. Skull and upper  cervical spine: Negative Sinuses/Orbits: Clear/normal Other: None IMPRESSION: 1. Acute infarction affecting the external capsule and caudate body on the right. No mass effect or hemorrhage. 2. 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 Electronically Signed   By: Nelson Chimes M.D.   On: 06/30/2020 20:30    Procedures Procedures   Medications Ordered in ED Medications  sodium chloride 0.9 % bolus 1,000 mL (1,000 mLs Intravenous New Bag/Given 06/30/20 1939)    ED Course  I have reviewed the triage vital signs and the nursing notes.  Pertinent labs & imaging results that were available during my care of the patient were reviewed by me and considered in my medical decision making (see chart for details).    MDM Rules/Calculators/A&P                          Patient is a 70 year old male with a past medical history significant for hypertension, tobacco abuse, alcohol abuse, who presents with reported left-sided facial droop and slurring of speech.  Patient does appear neurologically intact on exam aside from possible slurred speech, which patient states is chronic for him, also left-sided facial droop at rest, but able to smile symmetrically on exam.  Sensation intact throughout.  No focal weakness.  His cerebellar testing is also within normal limits.  Does not meet criteria for code stroke.  Attempted to call only family member listed in chart, unable to reach anyone.  Will obtain EKG, CT without contrast, MRI, CMP, CBC, mag, ethanol, CK.  We will go ahead and give patient normal saline bolus.  We will also obtain orthostatic vital signs given his report of generalized weakness.  Leading differential at this time is Warnicke's encephalopathy, will obtain imaging however to rule out TIA or CVA.  Vital signs are stable.  WBC slightly elevated, but appears chronic, improved from July 2021, when WBC 17.3.  BMP without abnormality.  MRI with acute infarction affecting external capsule and  caudate body on right.  Patient advised of results.  Neurology paged.  They will see patient.  He recommends CIWA with scheduled Benzo x 3 days then transitioning to prn.  Will page for unassigned admission.  [facility] agrees to admit.  Final Clinical Impression(s) / ED Diagnoses Final diagnoses:  Acute CVA (cerebrovascular accident) [facility])  Alcohol abuse    Rx / DC Orders ED Discharge Orders    None       Cleophas Dunker, DO 06/30/20 2216    Drenda Freeze, MD 07/01/20 1452

## 2020-06-30 NOTE — Consult Note (Signed)
NEURO HOSPITALIST CONSULT NOTE   Requestig physician: Dr. Darl Householder  Reason for Consult: Acute onset of left facial droop and slurred speech  History obtained from: Patient and Chart    HPI:                                                                                                                                          Cody Douglas is an 70 y.o. male with a PMHx of HTN, eye surgery and prostate cancer who presented to the [facility] ED from home on Wednesday after family found him with slurred speech and left facial droop. LKN was 10:00 AM. Vitals per EMS: SBP 160, HR 80, CBG 120.   MRI obtained in the ED revealed an acute infarction affecting the external capsule and caudate body on the right. An old left frontal cortical and subcortical infarction was also noted.   The patient has a long smoking history and currently smokes 2 ppd.   Home medications include ASA.   Past Medical History:  Diagnosis Date  . Hypertension   . Prostate cancer [facility])     Past Surgical History:  Procedure Laterality Date  . EYE SURGERY    . PROSTATE BIOPSY     Prostate surgery 8-9 yrs ago    Family History  Problem Relation Age of Onset  . Cancer - Lung Father   . Heart disease Brother   . Diabetes type II Neg Hx   . Stroke Neg Hx               Social History:  reports that he has been smoking cigarettes. He has a 100.00 pack-year smoking history. He does not have any smokeless tobacco history on file. He reports current alcohol use of about 1.0 standard drink of alcohol per week. He reports that he does not use drugs.  No Known Allergies  MEDICATIONS:                                                                                                                      No current facility-administered medications on file prior to encounter.   Current Outpatient Medications on File Prior to Encounter  Medication Sig Dispense Refill  . albuterol (PROVENTIL HFA;VENTOLIN HFA) 108  (90 BASE) MCG/ACT  inhaler Inhale 2 puffs into the lungs every 6 (six) hours as needed for wheezing or shortness of breath. 1 Inhaler 0  . ALPRAZolam (XANAX) 0.25 MG tablet Take 0.25 mg by mouth 3 (three) times daily.    Marland Kitchen aspirin 81 MG chewable tablet Chew 1 tablet (81 mg total) by mouth daily. 30 tablet 0  . cyclobenzaprine (FLEXERIL) 5 MG tablet Take 5 mg by mouth 2 (two) times daily.    Marland Kitchen lisinopril-hydrochlorothiazide (ZESTORETIC) 10-12.5 MG tablet Take 1 tablet by mouth daily.    . meloxicam (MOBIC) 7.5 MG tablet Take 7.5 mg by mouth daily.    . metoprolol tartrate (LOPRESSOR) 12.5 mg TABS tablet Take 1 tablet (25 mg total) by mouth 2 (two) times daily. 60 tablet 0  . Multiple Vitamins-Minerals (MULTIVITAMIN WITH MINERALS) tablet Take 1 tablet by mouth daily.    Marland Kitchen omeprazole (PRILOSEC) 20 MG capsule Take 20 mg by mouth daily.    Marland Kitchen oxyCODONE-acetaminophen (PERCOCET) 10-325 MG tablet Take 1 tablet by mouth 4 (four) times daily as needed for pain.      Inpatient Scheduled: .  stroke: mapping our early stages of recovery book   Does not apply Once  . aspirin  81 mg Oral Daily  . enoxaparin (LOVENOX) injection  40 mg Subcutaneous Q24H  . pantoprazole  40 mg Oral Daily     ROS:                                                                                                                                       As per HPI. In the context of being a poor historian, the patient has no additional complaints.    Blood pressure (!) 145/75, pulse (!) 106, temperature 98.4 F (36.9 C), resp. rate (!) 22, SpO2 93 %.   General Examination:                                                                                                       Physical Exam  HEENT-  [place]/AT    Lungs- Respirations unlabored Extremities- No edema  Neurological Examination Mental Status: Awake and alert with decreased attention noted. Oriented to day, month, year, city and state. Speech mildly dysarthric but fluent  with intact naming and comprehension.  Cranial Nerves: II: Visual fields intact bilaterally. PERRL.   III,IV, VI: No ptosis. EOMI. No nystagmus.  V,VII: Hyperesthesia to cold on the left. Mild left facial droop.  VIII: Hearing intact to voice IX,X: No hypophonia  XI: Symmetric XII: Midline tongue extension.  Motor: RUE 5/5 RLE 5/5 LUE 4+/5 LLE 4+/5 Subtle pronator drift on the left.  Sensory: Temp and FT intact to BUE and BLE without asymmetry. No extinction to DSS.  Deep Tendon Reflexes: 2+ and symmetric throughout Plantars: Tonically upgoing toes bilaterally.  Cerebellar: No ataxia with FNF bilaterally  Gait: Deferred   Lab Results: Basic Metabolic Panel: Recent Labs  Lab 06/30/20 1938  NA 136  K 3.9  CL 101  GLUCOSE 93  BUN 15  CREATININE 0.50*    CBC: Recent Labs  Lab 06/30/20 1905 06/30/20 1938  WBC 12.7*  --   NEUTROABS 10.2*  --   HGB 14.2 13.9  HCT 43.1 41.0  MCV 95.1  --   PLT 291  --     Cardiac Enzymes: No results for input(s): CKTOTAL, CKMB, CKMBINDEX, TROPONINI in the last 168 hours.  Lipid Panel: No results for input(s): CHOL, TRIG, HDL, CHOLHDL, VLDL, LDLCALC in the last 168 hours.  Imaging: MR BRAIN WO CONTRAST  Result Date: 06/30/2020 CLINICAL DATA:  Transient ischemic attack. EXAM: MRI HEAD WITHOUT CONTRAST TECHNIQUE: Multiplanar, multiecho pulse sequences of the brain and surrounding structures were obtained without intravenous contrast. COMPARISON:  None. FINDINGS: Brain: Diffusion imaging shows acute infarction within the external capsule and caudate body on the right. No evidence of mass effect or hemorrhage. No other acute insult. Elsewhere, the brainstem is normal. Old small vessel cerebellar infarctions inferior on the left. 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 No sign of mass lesion, hemorrhage, hydrocephalus or extra-axial collection. Vascular: Major vessels at the base of the brain show flow. Skull and upper cervical  spine: Negative Sinuses/Orbits: Clear/normal Other: None IMPRESSION: 1. Acute infarction affecting the external capsule and caudate body on the right. No mass effect or hemorrhage. 2. 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 Electronically Signed   By: Nelson Chimes M.D.   On: 06/30/2020 20:30    Assessment: 70 year old male presenting with early subacute right putamenal and periventricular white matter ischemic infarction 1. Exam reveals mild left sided weakness, mild left facial droop and mild dysarthria.  2. MRI brain: Acute infarction affecting the external capsule and caudate body on the right. No mass effect or hemorrhage. 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 3. Stroke risk factors: HTN, smoking, history of cancer and prior stroke seen on MRI.  4. Elevated CK of 6158 5. Mildly elevated LDL 6. Mild leukocytosis.  7. HgbA1c elevated at 6.3 8. EKG: Sinus rhythm, Right bundle branch block, Minimal ST elevation, inferior leads  Recommendations: 1. CTA of head and neck 2. TTE 3. Cardiac telemetry 4. PT consult, OT consult, Speech consult 5. Hold off on starting a statin due to elevated CK.  6. Classifiable as having failed ASA monotherapy. Add Plavix.   7. Risk factor modification to include smoking cessation 8. Frequent neuro checks 9. Permissive HTN x 24 hours.    Electronically signed: Dr. Kerney Elbe 06/30/2020, 9:54 PM

## 2020-07-01 ENCOUNTER — Inpatient Hospital Stay (HOSPITAL_COMMUNITY): Payer: Medicare Other

## 2020-07-01 ENCOUNTER — Observation Stay (HOSPITAL_COMMUNITY): Payer: Medicare Other

## 2020-07-01 DIAGNOSIS — I6389 Other cerebral infarction: Secondary | ICD-10-CM | POA: Diagnosis not present

## 2020-07-01 DIAGNOSIS — F1010 Alcohol abuse, uncomplicated: Secondary | ICD-10-CM | POA: Diagnosis present

## 2020-07-01 DIAGNOSIS — I4510 Unspecified right bundle-branch block: Secondary | ICD-10-CM | POA: Diagnosis present

## 2020-07-01 DIAGNOSIS — Z791 Long term (current) use of non-steroidal anti-inflammatories (NSAID): Secondary | ICD-10-CM | POA: Diagnosis not present

## 2020-07-01 DIAGNOSIS — Z7289 Other problems related to lifestyle: Secondary | ICD-10-CM | POA: Diagnosis not present

## 2020-07-01 DIAGNOSIS — I639 Cerebral infarction, unspecified: Secondary | ICD-10-CM

## 2020-07-01 DIAGNOSIS — F17210 Nicotine dependence, cigarettes, uncomplicated: Secondary | ICD-10-CM | POA: Diagnosis present

## 2020-07-01 DIAGNOSIS — I63511 Cerebral infarction due to unspecified occlusion or stenosis of right middle cerebral artery: Secondary | ICD-10-CM | POA: Diagnosis not present

## 2020-07-01 DIAGNOSIS — R29706 NIHSS score 6: Secondary | ICD-10-CM | POA: Diagnosis not present

## 2020-07-01 DIAGNOSIS — I10 Essential (primary) hypertension: Secondary | ICD-10-CM | POA: Diagnosis present

## 2020-07-01 DIAGNOSIS — G894 Chronic pain syndrome: Secondary | ICD-10-CM | POA: Diagnosis present

## 2020-07-01 DIAGNOSIS — R310 Gross hematuria: Secondary | ICD-10-CM | POA: Diagnosis not present

## 2020-07-01 DIAGNOSIS — C3411 Malignant neoplasm of upper lobe, right bronchus or lung: Secondary | ICD-10-CM | POA: Diagnosis present

## 2020-07-01 DIAGNOSIS — Z20822 Contact with and (suspected) exposure to covid-19: Secondary | ICD-10-CM | POA: Diagnosis present

## 2020-07-01 DIAGNOSIS — D72828 Other elevated white blood cell count: Secondary | ICD-10-CM | POA: Diagnosis present

## 2020-07-01 DIAGNOSIS — D62 Acute posthemorrhagic anemia: Secondary | ICD-10-CM | POA: Diagnosis present

## 2020-07-01 DIAGNOSIS — Z8249 Family history of ischemic heart disease and other diseases of the circulatory system: Secondary | ICD-10-CM | POA: Diagnosis not present

## 2020-07-01 DIAGNOSIS — R29810 Facial weakness: Secondary | ICD-10-CM | POA: Diagnosis present

## 2020-07-01 DIAGNOSIS — Z720 Tobacco use: Secondary | ICD-10-CM | POA: Diagnosis not present

## 2020-07-01 DIAGNOSIS — R29702 NIHSS score 2: Secondary | ICD-10-CM | POA: Diagnosis present

## 2020-07-01 DIAGNOSIS — F419 Anxiety disorder, unspecified: Secondary | ICD-10-CM | POA: Diagnosis present

## 2020-07-01 DIAGNOSIS — I340 Nonrheumatic mitral (valve) insufficiency: Secondary | ICD-10-CM | POA: Diagnosis not present

## 2020-07-01 DIAGNOSIS — D72829 Elevated white blood cell count, unspecified: Secondary | ICD-10-CM | POA: Diagnosis not present

## 2020-07-01 DIAGNOSIS — I330 Acute and subacute infective endocarditis: Secondary | ICD-10-CM | POA: Diagnosis present

## 2020-07-01 DIAGNOSIS — I422 Other hypertrophic cardiomyopathy: Secondary | ICD-10-CM | POA: Diagnosis present

## 2020-07-01 DIAGNOSIS — R319 Hematuria, unspecified: Secondary | ICD-10-CM | POA: Diagnosis not present

## 2020-07-01 DIAGNOSIS — I351 Nonrheumatic aortic (valve) insufficiency: Secondary | ICD-10-CM | POA: Diagnosis not present

## 2020-07-01 DIAGNOSIS — R4781 Slurred speech: Secondary | ICD-10-CM

## 2020-07-01 DIAGNOSIS — Z7982 Long term (current) use of aspirin: Secondary | ICD-10-CM | POA: Diagnosis not present

## 2020-07-01 DIAGNOSIS — Z79899 Other long term (current) drug therapy: Secondary | ICD-10-CM | POA: Diagnosis not present

## 2020-07-01 DIAGNOSIS — R918 Other nonspecific abnormal finding of lung field: Secondary | ICD-10-CM | POA: Diagnosis not present

## 2020-07-01 DIAGNOSIS — R29703 NIHSS score 3: Secondary | ICD-10-CM | POA: Diagnosis not present

## 2020-07-01 DIAGNOSIS — Z8546 Personal history of malignant neoplasm of prostate: Secondary | ICD-10-CM | POA: Diagnosis not present

## 2020-07-01 LAB — ECHOCARDIOGRAM COMPLETE
AR max vel: 5.85 cm2
AV Area VTI: 5.87 cm2
AV Area mean vel: 5.39 cm2
AV Mean grad: 9.5 mmHg
AV Peak grad: 15.9 mmHg
Ao pk vel: 2 m/s
Area-P 1/2: 2.09 cm2
Calc EF: 66 %
MV M vel: 4.86 m/s
MV Peak grad: 94.5 mmHg
MV VTI: 5.32 cm2
P 1/2 time: 385 msec
S' Lateral: 4 cm
Single Plane A2C EF: 62.5 %
Single Plane A4C EF: 61.7 %

## 2020-07-01 LAB — RAPID URINE DRUG SCREEN, HOSP PERFORMED
Amphetamines: NOT DETECTED
Barbiturates: NOT DETECTED
Benzodiazepines: POSITIVE — AB
Cocaine: NOT DETECTED
Opiates: NOT DETECTED
Tetrahydrocannabinol: NOT DETECTED

## 2020-07-01 LAB — LIPID PANEL
Cholesterol: 176 mg/dL (ref 0–200)
HDL: 48 mg/dL (ref >40)
LDL Cholesterol: 115 mg/dL — ABNORMAL HIGH (ref 0–99)
Total CHOL/HDL Ratio: 3.7 RATIO
Triglycerides: 67 mg/dL (ref <150)
VLDL: 13 mg/dL (ref 0–40)

## 2020-07-01 LAB — HEMOGLOBIN A1C
Hgb A1c MFr Bld: 6.3 % — ABNORMAL HIGH (ref 4.8–5.6)
Mean Plasma Glucose: 134.11 mg/dL

## 2020-07-01 IMAGING — MR MR MRA HEAD W/O CM
2 series · 19 of 48 positions shown · non-contrast
Comparison: MRI head [DATE]

CLINICAL DATA: Stroke

EXAM:
MRA HEAD WITHOUT CONTRAST
TECHNIQUE: Angiographic images of the Circle of Willis were obtained using MRA
technique without intravenous contrast.

[Series 2: ax (id) · axial · 1.0mm · 0.43mm/px · z∈[-63,+20]mm · 18 of 176 slices shown]
[im 1/176]
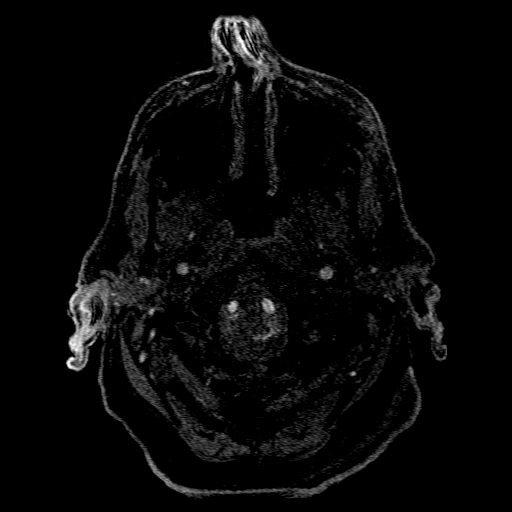
[im 4/176]
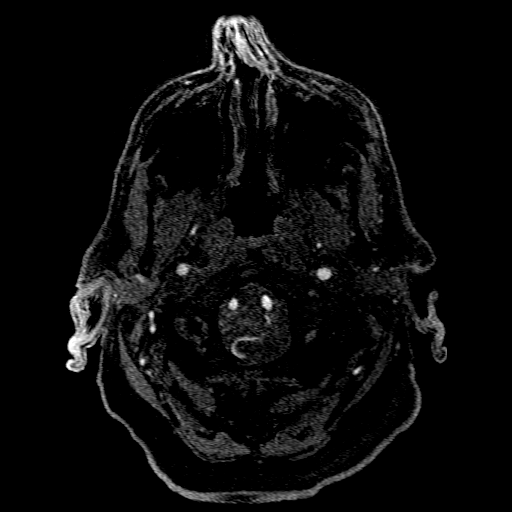
[im 8/176]
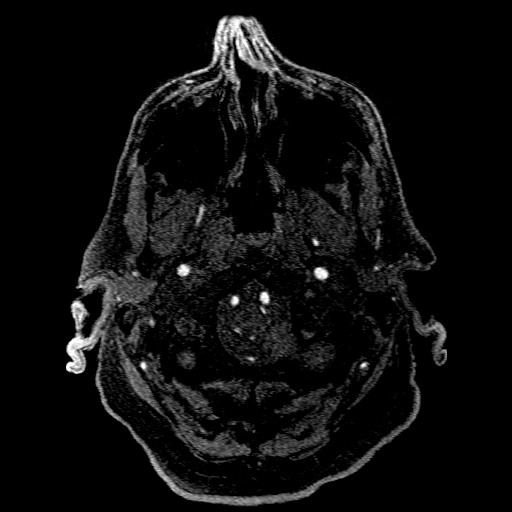
[im 12/176]
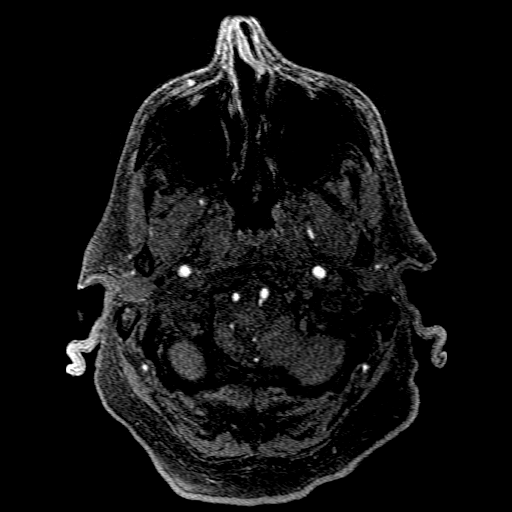
[im 16/176]
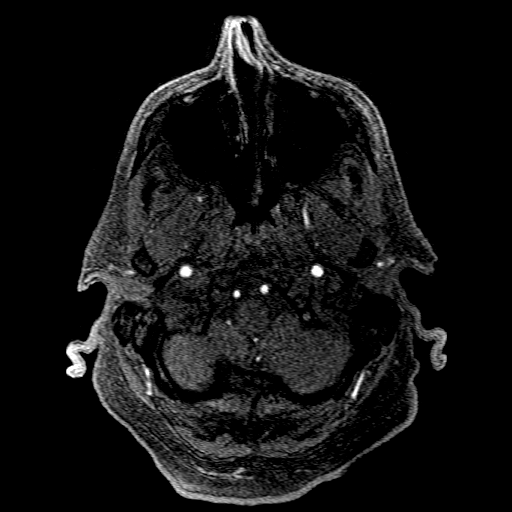
[im 20/176]
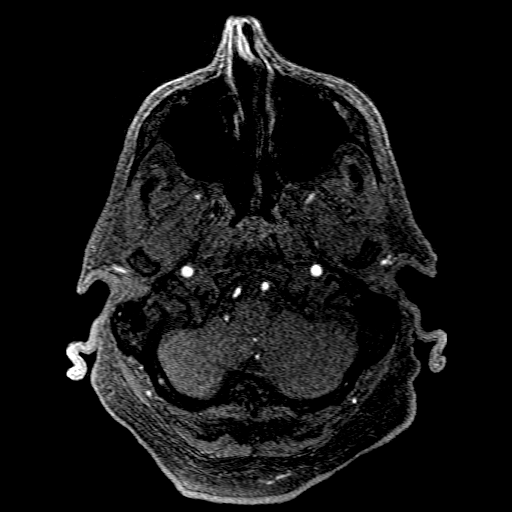
[im 23/176]
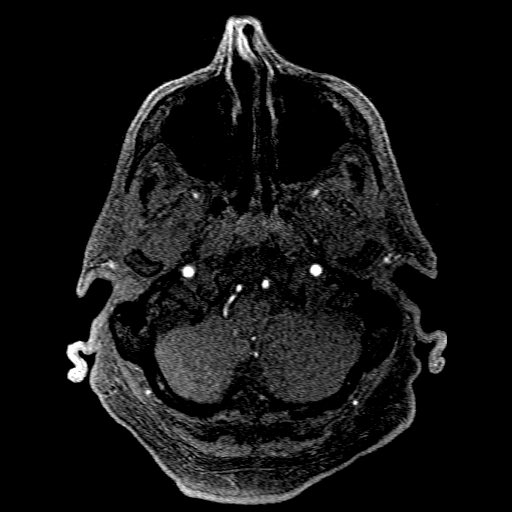
[im 27/176]
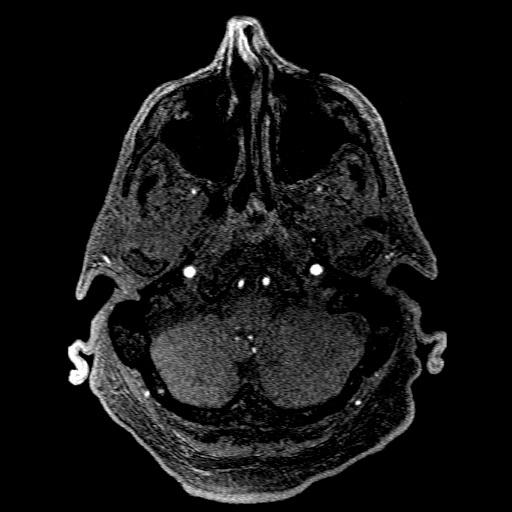
[im 31/176]
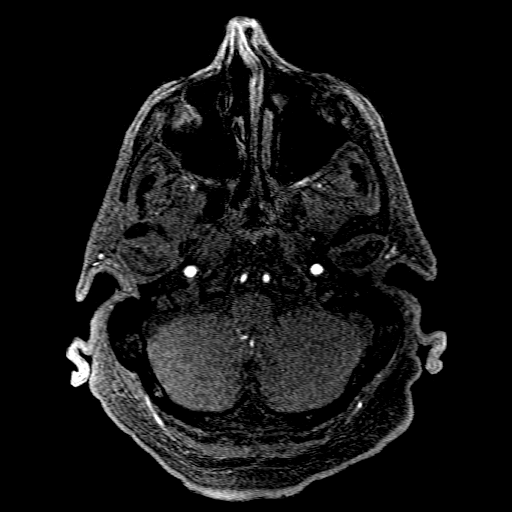
[im 35/176]
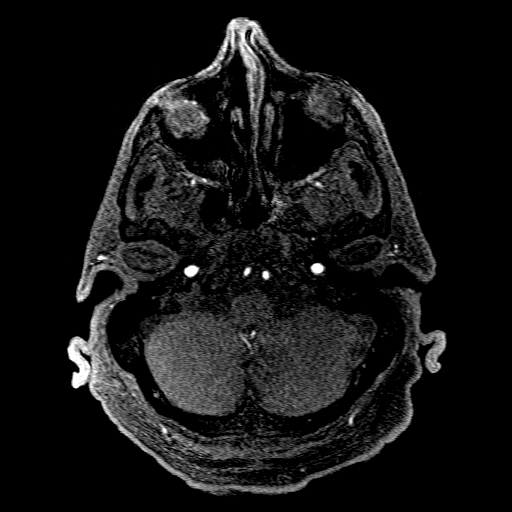
[im 54/176]
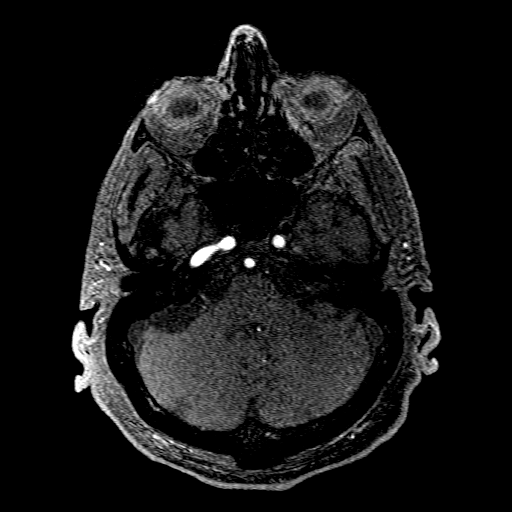
[im 77/176]
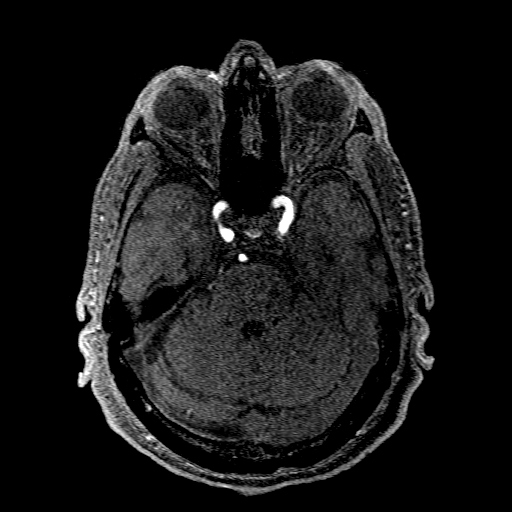
[im 88/176]
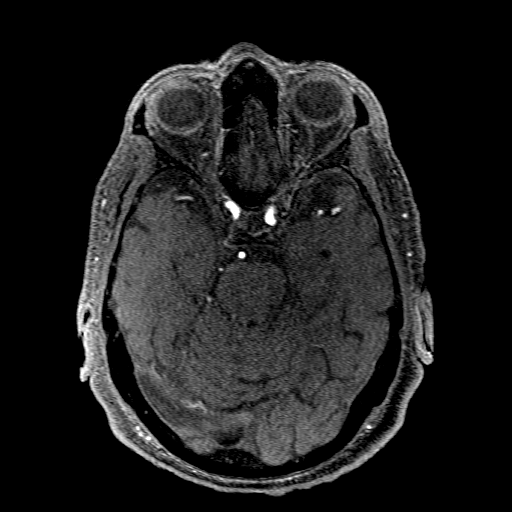
[im 99/176]
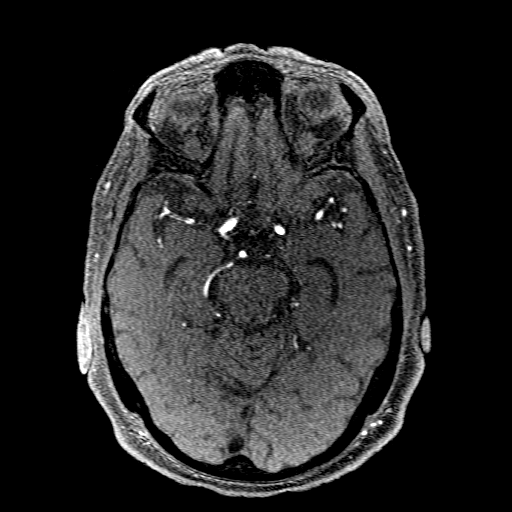
[im 122/176]
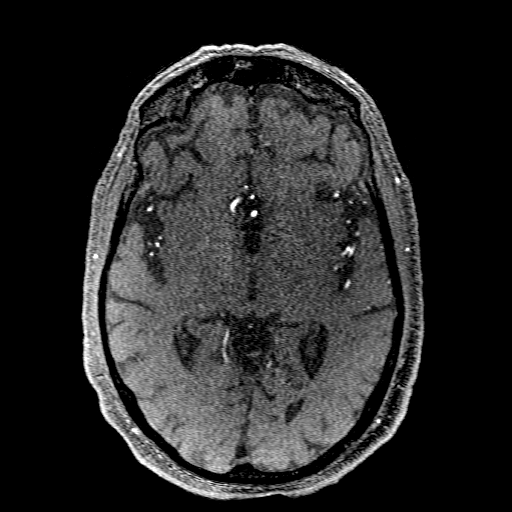
[im 145/176]
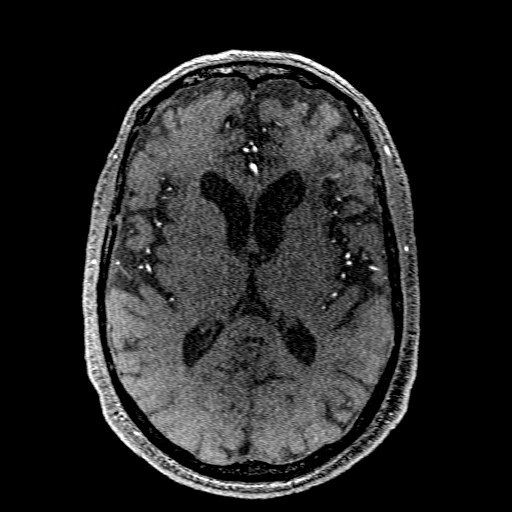
[im 149/176]
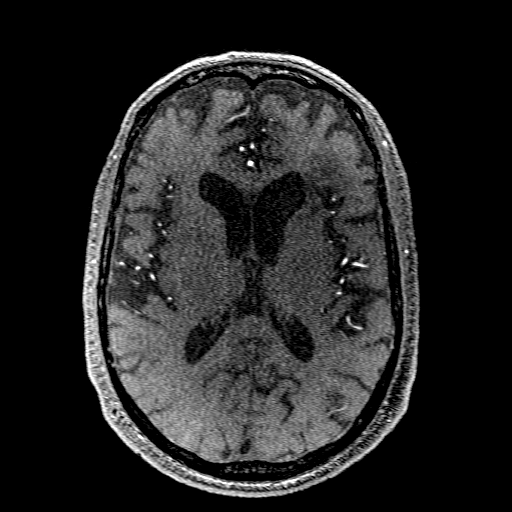
[im 168/176]
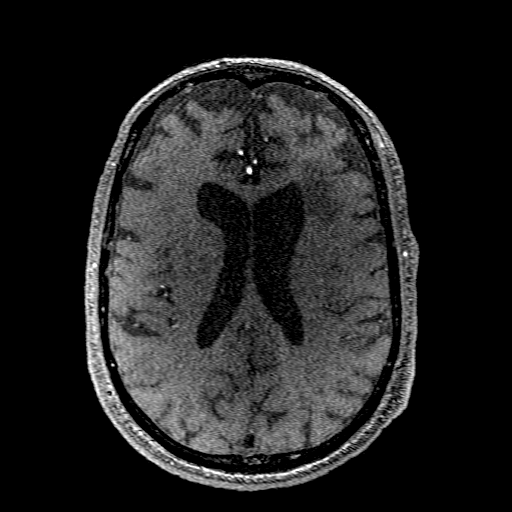

[Series 201: pjn:ax (id) · sagittal · 1.0mm · 0.43mm/px · 1 of 5 slices shown]
[im 1/5]
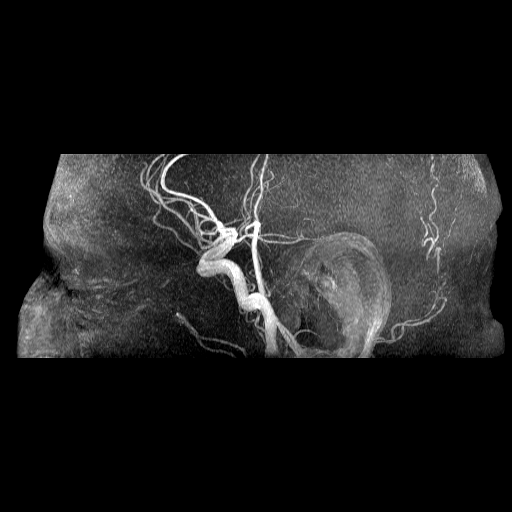

[19 of 48 positions shown; findings below may reference images not displayed]

FINDINGS: Internal carotid artery widely patent and normal bilaterally.

Severe stenosis right M1 segment. There appears to be associated
filling defect. This could be atherosclerotic disease and or
thrombus. This extends to the right MCA bifurcation. Right M2
branches patent with decreased caliber compared to the left.

Fenestrated left M1 segment. Left MCA widely patent. Both anterior
cerebral arteries widely patent.

Basilar widely patent. PICA patent bilaterally. Both vertebral
arteries widely patent. Posterior cerebral arteries widely patent
with fetal origin on the right.
IMPRESSION: Severe stenosis right M1 segment. Associated filling defect may
represent thrombus and or atherosclerotic plaque. Decreased
perfusion right MCA branches which remains patent. No other
significant cranial stenosis

Code stroke imaging results were communicated on [DATE] at [DATE] to provider ROSA SOUSA via text page

## 2020-07-01 MED ORDER — CLOPIDOGREL BISULFATE 75 MG PO TABS
75.0000 mg | ORAL_TABLET | Freq: Every day | ORAL | Status: DC
  Administered 2020-07-01 – 2020-07-04 (×4): 75 mg via ORAL
  Filled 2020-07-01 (×4): qty 1

## 2020-07-01 MED ORDER — METOPROLOL SUCCINATE ER 25 MG PO TB24
25.0000 mg | ORAL_TABLET | Freq: Every day | ORAL | Status: DC
  Administered 2020-07-01 – 2020-07-15 (×15): 25 mg via ORAL
  Filled 2020-07-01 (×15): qty 1

## 2020-07-01 MED ORDER — POLYETHYLENE GLYCOL 3350 17 G PO PACK
17.0000 g | PACK | Freq: Every day | ORAL | Status: DC
  Administered 2020-07-01 – 2020-07-15 (×9): 17 g via ORAL
  Filled 2020-07-01 (×11): qty 1

## 2020-07-01 MED ORDER — THIAMINE HCL 100 MG PO TABS
100.0000 mg | ORAL_TABLET | Freq: Every day | ORAL | Status: DC
  Administered 2020-07-01 – 2020-07-15 (×14): 100 mg via ORAL
  Filled 2020-07-01 (×14): qty 1

## 2020-07-01 MED ORDER — ASPIRIN EC 325 MG PO TBEC
325.0000 mg | DELAYED_RELEASE_TABLET | Freq: Every day | ORAL | Status: DC
  Administered 2020-07-02 – 2020-07-04 (×3): 325 mg via ORAL
  Filled 2020-07-01 (×3): qty 1

## 2020-07-01 MED ORDER — ATORVASTATIN CALCIUM 40 MG PO TABS
40.0000 mg | ORAL_TABLET | Freq: Every day | ORAL | Status: DC
  Administered 2020-07-01 – 2020-07-15 (×14): 40 mg via ORAL
  Filled 2020-07-01 (×14): qty 1

## 2020-07-01 MED ORDER — CYCLOBENZAPRINE HCL 10 MG PO TABS
5.0000 mg | ORAL_TABLET | Freq: Three times a day (TID) | ORAL | Status: DC
  Administered 2020-07-01 – 2020-07-15 (×38): 5 mg via ORAL
  Filled 2020-07-01 (×39): qty 1

## 2020-07-01 MED ORDER — ALPRAZOLAM 0.5 MG PO TABS
0.5000 mg | ORAL_TABLET | Freq: Three times a day (TID) | ORAL | Status: DC
  Administered 2020-07-01 – 2020-07-15 (×39): 0.5 mg via ORAL
  Filled 2020-07-01 (×40): qty 1

## 2020-07-01 MED ORDER — FOLIC ACID 1 MG PO TABS
1.0000 mg | ORAL_TABLET | Freq: Every day | ORAL | Status: DC
  Administered 2020-07-01 – 2020-07-15 (×14): 1 mg via ORAL
  Filled 2020-07-01 (×15): qty 1

## 2020-07-01 MED ORDER — PROSIGHT PO TABS
1.0000 | ORAL_TABLET | Freq: Every day | ORAL | Status: DC
  Administered 2020-07-01 – 2020-07-15 (×14): 1 via ORAL
  Filled 2020-07-01 (×14): qty 1

## 2020-07-01 NOTE — ED Notes (Signed)
Pt given a [place] sandwich, cup of applesauce, and a cup of water per Dr. Kara Mead.

## 2020-07-01 NOTE — Evaluation (Signed)
Speech Language Pathology Evaluation Patient Details Name: Cody Douglas MRN: [id number] DOB: 22-Apr-1950 Today's Date: 07/01/2020 Time: 5465-6812 SLP Time Calculation (min) (ACUTE ONLY): 20 min  Problem List:  Patient Active Problem List   Diagnosis Date Noted  . CVA (cerebral vascular accident) ([place]) 07/01/2020  . Acute CVA (cerebrovascular accident) ([place]) 06/30/2020  . Essential hypertension 06/30/2020  . Alcohol use 06/30/2020  . Leukocytosis 06/30/2020  . Hypertrophic cardiomyopathy ([place]) 12/14/2013  . Abnormal ECG 12/13/2013  . CAP (community acquired pneumonia) 12/13/2013  . Chest pain 12/12/2013  . Hyponatremia 12/12/2013  . Hypokalemia 12/12/2013  . Tobacco abuse 12/12/2013   Past Medical History:  Past Medical History:  Diagnosis Date  . Hypertension   . Prostate cancer [facility])    Past Surgical History:  Past Surgical History:  Procedure Laterality Date  . EYE SURGERY    . PROSTATE BIOPSY     Prostate surgery 8-9 yrs ago   HPI:      Assessment / Plan / Recommendation Clinical Impression  Pt presents with suspected mild cogntiive deficits (suspected to be worsened slighlty from baseline) and mild dysarthria of speech post CVA. When gathering PLOF, pt admits he has always had some trouble with memory and mild anomia of speech.  He reports prior independence with all ADLs.  Deficits in cognition include decreased executive function skills, reduced novel recall, and reduced thought organization. Pt with mild dysarthria of speech impairing articulation slightly however communication remained highly intelligible. Oral motor exam continues to exhibit mild left facial droop with decreased left facial sensation. Continued ST intervention indicated for deficits noted above.    SLP Assessment  SLP Recommendation/Assessment: Patient needs continued Speech Lanaguage Pathology Services SLP Visit Diagnosis: Dysarthria and anarthria (R47.1)    Follow Up Recommendations        Frequency and Duration min 1 x/week  1 week      SLP Evaluation Cognition  Overall Cognitive Status: No family/caregiver present to determine baseline cognitive functioning (suspected to be impaired greater than baseline due to deficits exhibited with functional tasks per PT notes) Arousal/Alertness: Awake/alert Orientation Level: Oriented to person;Oriented to place;Oriented to time;Oriented to situation Attention: Focused;Sustained Focused Attention: Impaired Focused Attention Impairment: Verbal complex;Functional complex Memory: Impaired Memory Impairment: Decreased recall of new information Awareness: Impaired Problem Solving: Impaired Problem Solving Impairment: Verbal complex;Functional complex Executive Function: Organizing;Self Monitoring Organizing: Impaired Self Monitoring: Impaired Safety/Judgment: Impaired       Comprehension  Auditory Comprehension Overall Auditory Comprehension: Appears within functional limits for tasks assessed    Expression Verbal Expression Overall Verbal Expression: Appears within functional limits for tasks assessed (suspected to be at baseline some mild anomia reported at baseline but none observed during interaction with SLP) Written Expression Dominant Hand: Right   Oral / Motor  Motor Speech Overall Motor Speech: Impaired Respiration: Within functional limits Phonation: Normal Resonance: Within functional limits Articulation: Impaired Level of Impairment: Word Intelligibility: Intelligible Motor Planning: Witnin functional limits Effective Techniques: Slow rate;Over-articulate;Increased vocal intensity   GO                    Hayden Rasmussen MA, CCC-SLP Acute Rehabilitation Services   07/01/2020, 4:04 PM

## 2020-07-01 NOTE — ED Notes (Signed)
Tray ordered for patient.

## 2020-07-01 NOTE — Progress Notes (Signed)
*  PRELIMINARY RESULTS* Echocardiogram 2D Echocardiogram has been performed.  Luisa Hart 07/01/2020, 9:43 AM

## 2020-07-01 NOTE — Progress Notes (Signed)
STROKE TEAM PROGRESS NOTE   SUBJECTIVE (INTERVAL HISTORY) His brother is at the bedside.  Overall his condition is stable.  He still has mild left facial droop and left hemiparesis.  MRI showed right M1 high-grade stenosis.  He admitted that he smokes 2 pack/day, down from 4 packs/day before.  He drinks a bottle of vodka for 1 day or 2 every week, down from previous a bottle of vodka daily.  UDS pending.   OBJECTIVE Temp:  [98.2 F (36.8 C)-98.8 F (37.1 C)] 98.2 F (36.8 C) (04/21 1514) Pulse Rate:  [44-107] 83 (04/21 1514) Cardiac Rhythm: Normal sinus rhythm (04/21 1503) Resp:  [17-37] 20 (04/21 1514) BP: (106-155)/(56-86) 112/70 (04/21 1514) SpO2:  [92 %-100 %] 97 % (04/21 1514)  No results for input(s): GLUCAP in the last 168 hours. Recent Labs  Lab 06/30/20 1938 06/30/20 2200  NA 136 134*  K 3.9 3.4*  CL 101 100  CO2  --  23  GLUCOSE 93 94  BUN 15 11  CREATININE 0.50* 0.58*  CALCIUM  --  9.0  MG  --  1.9   Recent Labs  Lab 06/30/20 2200  AST 118*  ALT 33  ALKPHOS 79  BILITOT 0.8  PROT 6.3*  ALBUMIN 3.4*   Recent Labs  Lab 06/30/20 1905 06/30/20 1938  WBC 12.7*  --   NEUTROABS 10.2*  --   HGB 14.2 13.9  HCT 43.1 41.0  MCV 95.1  --   PLT 291  --    Recent Labs  Lab 06/30/20 2200  CKTOTAL 6,158*   No results for input(s): LABPROT, INR in the last 72 hours. No results for input(s): COLORURINE, LABSPEC, [place], GLUCOSEU, HGBUR, BILIRUBINUR, KETONESUR, PROTEINUR, UROBILINOGEN, NITRITE, LEUKOCYTESUR in the last 72 hours.  Invalid input(s): APPERANCEUR     Component Value Date/Time   CHOL 176 07/01/2020 0153   TRIG 67 07/01/2020 0153   HDL 48 07/01/2020 0153   CHOLHDL 3.7 07/01/2020 0153   VLDL 13 07/01/2020 0153   LDLCALC 115 (H) 07/01/2020 0153   Lab Results  Component Value Date   HGBA1C 6.3 (H) 07/01/2020   No results found for: LABOPIA, COCAINSCRNUR, LABBENZ, AMPHETMU, THCU, LABBARB  No results for input(s): ETH in the last 168  hours.  I have personally reviewed the radiological images below and agree with the radiology interpretations.  MR ANGIO HEAD WO CONTRAST  Result Date: 07/01/2020 CLINICAL DATA:  Stroke EXAM: MRA HEAD WITHOUT CONTRAST TECHNIQUE: Angiographic images of the Circle of Willis were obtained using MRA technique without intravenous contrast. COMPARISON:  MRI head 06/30/2020 FINDINGS: Internal carotid artery widely patent and normal bilaterally. Severe stenosis right M1 segment. There appears to be associated filling defect. This could be atherosclerotic disease and or thrombus. This extends to the right MCA bifurcation. Right M2 branches patent with decreased caliber compared to the left. Fenestrated left M1 segment. Left MCA widely patent. Both anterior cerebral arteries widely patent. Basilar widely patent. PICA patent bilaterally. Both vertebral arteries widely patent. Posterior cerebral arteries widely patent with fetal origin on the right. IMPRESSION: Severe stenosis right M1 segment. Associated filling defect may represent thrombus and or atherosclerotic plaque. Decreased perfusion right MCA branches which remains patent. No other significant cranial stenosis Code stroke imaging results were communicated on 07/01/2020 at 2:11 pm to provider Erlinda Hong via text page Electronically Signed   By: Franchot Gallo M.D.   On: 07/01/2020 14:13   MR BRAIN WO CONTRAST  Result Date: 06/30/2020 CLINICAL DATA:  Transient  ischemic attack. EXAM: MRI HEAD WITHOUT CONTRAST TECHNIQUE: Multiplanar, multiecho pulse sequences of the brain and surrounding structures were obtained without intravenous contrast. COMPARISON:  None. FINDINGS: Brain: Diffusion imaging shows acute infarction within the external capsule and caudate body on the right. No evidence of mass effect or hemorrhage. No other acute insult. Elsewhere, the brainstem is normal. Old small vessel cerebellar infarctions inferior on the left. Old left frontal cortical and  subcortical infarction. No sign of mass lesion, hemorrhage, hydrocephalus or extra-axial collection. Vascular: Major vessels at the base of the brain show flow. Skull and upper cervical spine: Negative Sinuses/Orbits: Clear/normal Other: None IMPRESSION: 1. Acute infarction affecting the external capsule and caudate body on the right. No mass effect or hemorrhage. 2. 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 Electronically Signed   By: Nelson Chimes M.D.   On: 06/30/2020 20:30   ECHOCARDIOGRAM COMPLETE  Result Date: 07/01/2020    ECHOCARDIOGRAM REPORT   Patient Name:   Cody Douglas Date of Exam: 07/01/2020 Medical Rec #:  [id number]      Height:       71.0 in Accession #:    [id number]     Weight:       170.0 lb Date of Birth:  February 28, 1951      BSA:          1.968 m Patient Age:    70 years       BP:           125/79 mmHg Patient Gender: M              HR:           77 bpm. Exam Location:  Inpatient Procedure: 2D Echo, Cardiac Doppler and Color Doppler Indications:    CVA  History:        Patient has no prior history of Echocardiogram examinations.                 Risk Factors:Hypertension.  Sonographer:    Luisa Hart RDCS Referring Phys: [id number] [place] Comments: No cardiac surgery or procedures noted in chart IMPRESSIONS  1. Left ventricular ejection fraction, by estimation, is 55 to 60%. The left ventricle has normal function. The left ventricle has no regional wall motion abnormalities. Left ventricular diastolic parameters are consistent with Grade I diastolic dysfunction (impaired relaxation).  2. Right ventricular systolic function is normal. The right ventricular size is normal. There is normal pulmonary artery systolic pressure.  3. Severe calcified bulky lesions on posterior and anterior leaflets Despite calcification edges/borders seem irregular and favor vegetations rather than MAC Suggest TEE to further evaluate. The mitral valve is abnormal. Mild mitral valve  regurgitation.  No evidence of mitral stenosis.  4. Severe bulky calcification/ mass particularly on the Non coronary cusp and commisure between the right and non cusps. Again despite calcification edges irregular and suggest vegetations TEE and cardiology consult suggested . The aortic valve is abnormal. Aortic valve regurgitation is mild. No aortic stenosis is present.  5. The inferior vena cava is normal in size with greater than 50% respiratory variability, suggesting right atrial pressure of 3 mmHg. FINDINGS  Left Ventricle: Left ventricular ejection fraction, by estimation, is 55 to 60%. The left ventricle has normal function. The left ventricle has no regional wall motion abnormalities. The left ventricular internal cavity size was normal in size. There is  no left ventricular hypertrophy. Left ventricular diastolic parameters are consistent with Grade I diastolic dysfunction (  impaired relaxation). Right Ventricle: The right ventricular size is normal. No increase in right ventricular wall thickness. Right ventricular systolic function is normal. There is normal pulmonary artery systolic pressure. The tricuspid regurgitant velocity is 2.56 m/s, and  with an assumed right atrial pressure of 3 mmHg, the estimated right ventricular systolic pressure is 84.6 mmHg. Left Atrium: Left atrial size was normal in size. Right Atrium: Right atrial size was normal in size. Pericardium: There is no evidence of pericardial effusion. Mitral Valve: Severe calcified bulky lesions on posterior and anterior leaflets Despite calcification edges/borders seem irregular and favor vegetations rather than MAC Suggest TEE to further evaluate. The mitral valve is abnormal. Mild mitral valve regurgitation. No evidence of mitral valve stenosis. MV peak gradient, 8.1 mmHg. The mean mitral valve gradient is 4.0 mmHg. Tricuspid Valve: The tricuspid valve is normal in structure. Tricuspid valve regurgitation is not demonstrated. No evidence  of tricuspid stenosis. Aortic Valve: Severe bulky calcification/ mass particularly on the Non coronary cusp and commisure between the right and non cusps. Again despite calcification edges irregular and suggest vegetations TEE and cardiology consult suggested. The aortic valve  is abnormal. Aortic valve regurgitation is mild. Aortic regurgitation PHT measures 385 msec. No aortic stenosis is present. Aortic valve mean gradient measures 9.5 mmHg. Aortic valve peak gradient measures 15.9 mmHg. Aortic valve area, by VTI measures 5.87 cm. Pulmonic Valve: The pulmonic valve was normal in structure. Pulmonic valve regurgitation is not visualized. No evidence of pulmonic stenosis. Aorta: The aortic root is normal in size and structure. Venous: The inferior vena cava is normal in size with greater than 50% respiratory variability, suggesting right atrial pressure of 3 mmHg. IAS/Shunts: No atrial level shunt detected by color flow Doppler.  LEFT VENTRICLE PLAX 2D LVIDd:         5.20 cm     Diastology LVIDs:         4.00 cm     LV e' medial:    4.35 cm/s LV PW:         0.70 cm     LV E/e' medial:  21.0 LV IVS:        1.00 cm     LV e' lateral:   7.62 cm/s LVOT diam:     2.70 cm     LV E/e' lateral: 12.0 LV SV:         226 LV SV Index:   115 LVOT Area:     5.73 cm  LV Volumes (MOD) LV vol d, MOD A2C: 79.2 ml LV vol d, MOD A4C: 56.4 ml LV vol s, MOD A2C: 29.7 ml LV vol s, MOD A4C: 21.6 ml LV SV MOD A2C:     49.5 ml LV SV MOD A4C:     56.4 ml LV SV MOD BP:      48.8 ml RIGHT VENTRICLE RV S prime:     12.10 cm/s TAPSE (M-mode): 1.5 cm LEFT ATRIUM             Index       RIGHT ATRIUM           Index LA diam:        3.60 cm 1.83 cm/m  RA Area:     13.80 cm LA Vol (A2C):   31.8 ml 16.16 ml/m RA Volume:   32.60 ml  16.57 ml/m LA Vol (A4C):   46.7 ml 23.73 ml/m LA Biplane Vol: 39.5 ml 20.07 ml/m  AORTIC VALVE  PULMONIC VALVE AV Area (Vmax):    5.85 cm     PV Vmax:       0.66 m/s AV Area (Vmean):   5.39 cm      PV Peak grad:  1.8 mmHg AV Area (VTI):     5.87 cm AV Vmax:           199.50 cm/s AV Vmean:          143.500 cm/s AV VTI:            0.384 m AV Peak Grad:      15.9 mmHg AV Mean Grad:      9.5 mmHg LVOT Vmax:         204.00 cm/s LVOT Vmean:        135.000 cm/s LVOT VTI:          0.394 m LVOT/AV VTI ratio: 1.03 AI PHT:            385 msec  AORTA Ao Root diam: 4.00 cm MITRAL VALVE                TRICUSPID VALVE MV Area (PHT): 2.09 cm     TR Peak grad:   26.2 mmHg MV Area VTI:   5.32 cm     TR Vmax:        256.00 cm/s MV Peak grad:  8.1 mmHg MV Mean grad:  4.0 mmHg     SHUNTS MV Vmax:       1.42 m/s     Systemic VTI:  0.39 m MV Vmean:      90.8 cm/s    Systemic Diam: 2.70 cm MV Decel Time: 363 msec MR Peak grad: 94.5 mmHg MR Vmax:      486.00 cm/s MV E velocity: 91.30 cm/s MV A velocity: 145.00 cm/s MV E/A ratio:  0.63 Jenkins Rouge MD Electronically signed by Jenkins Rouge MD Signature Date/Time: 07/01/2020/10:07:46 AM    Final    VAS US CAROTID (at [facility] and [facility] only)  Result Date: 07/01/2020 Carotid Arterial Duplex Study Indications:       CVA and Slurred speech & facial droop (left). Risk Factors:      Hypertension, current smoker. Other Factors:     ETOH abuse. Comparison Study:  No previous exams Performing Technologist: Rogelia Rohrer  Examination Guidelines: A complete evaluation includes B-mode imaging, spectral Doppler, color Doppler, and power Doppler as needed of all accessible portions of each vessel. Bilateral testing is considered an integral part of a complete examination. Limited examinations for reoccurring indications may be performed as noted.  Right Carotid Findings: +----------+--------+--------+--------+------------------+------------------+           PSV cm/sEDV cm/sStenosisPlaque DescriptionComments           +----------+--------+--------+--------+------------------+------------------+ CCA Prox  83      12                                intimal thickening  +----------+--------+--------+--------+------------------+------------------+ CCA Distal86      16                                intimal thickening +----------+--------+--------+--------+------------------+------------------+ ICA Prox  53      10                                                   +----------+--------+--------+--------+------------------+------------------+  ICA Distal58      16                                                   +----------+--------+--------+--------+------------------+------------------+ ECA       125     8                                                    +----------+--------+--------+--------+------------------+------------------+ +----------+--------+-------+----------------+-------------------+           PSV cm/sEDV cmsDescribe        Arm Pressure (mmHG) +----------+--------+-------+----------------+-------------------+ [id number]             Multiphasic, WNL                    +----------+--------+-------+----------------+-------------------+ +---------+--------+--+--------+-+---------+ VertebralPSV cm/s35EDV cm/s7Antegrade +---------+--------+--+--------+-+---------+  Left Carotid Findings: +----------+--------+--------+--------+------------------+------------------+           PSV cm/sEDV cm/sStenosisPlaque DescriptionComments           +----------+--------+--------+--------+------------------+------------------+ CCA Prox  84      15                                intimal thickening +----------+--------+--------+--------+------------------+------------------+ CCA Distal80      19                                intimal thickening +----------+--------+--------+--------+------------------+------------------+ ICA Prox  64      20                                                   +----------+--------+--------+--------+------------------+------------------+ ICA Distal76      26                                                    +----------+--------+--------+--------+------------------+------------------+ ECA       179     19                                                   +----------+--------+--------+--------+------------------+------------------+ +----------+--------+--------+----------------+-------------------+           PSV cm/sEDV cm/sDescribe        Arm Pressure (mmHG) +----------+--------+--------+----------------+-------------------+ [id number]              Multiphasic, WNL                    +----------+--------+--------+----------------+-------------------+ +---------+--------+--------+--------------+ VertebralPSV cm/sEDV cm/sNot identified +---------+--------+--------+--------------+   Summary: Right Carotid: The extracranial vessels were near-normal with only minimal wall                thickening or plaque. Left Carotid: The extracranial vessels were near-normal with only minimal wall  thickening or plaque. Vertebrals:  Right vertebral artery demonstrates antegrade flow. Left vertebral              artery was not visualized. Subclavians: Normal flow hemodynamics were seen in bilateral subclavian              arteries. *See table(s) above for measurements and observations.     Preliminary     PHYSICAL EXAM  Temp:  [98.2 F (36.8 C)-98.8 F (37.1 C)] 98.2 F (36.8 C) (04/21 1514) Pulse Rate:  [44-107] 83 (04/21 1514) Resp:  [17-37] 20 (04/21 1514) BP: (106-155)/(56-86) 112/70 (04/21 1514) SpO2:  [92 %-100 %] 97 % (04/21 1514)  General - Well nourished, well developed, in no apparent distress.  Ophthalmologic - fundi not visualized due to noncooperation.  Cardiovascular - Regular rhythm and rate.  Mental Status -  Level of arousal and orientation to time, place, and person were intact. Language including expression, naming, repetition, comprehension was assessed and found intact.  Cranial Nerves II - XII - II - Visual field intact  OU. III, IV, VI - Extraocular movements intact. V - Facial sensation intact bilaterally. VII - Facial movement intact bilaterally. VIII - Hearing & vestibular intact bilaterally. X - Palate elevates symmetrically.  Mild dysarthria with poor denture XI - Chin turning & shoulder shrug intact bilaterally. XII - Tongue protrusion intact.  Motor Strength - The patient's strength was normal in right upper and lower extremities, left upper extremity proximal 3+/5, bicep tricep and finger grip 5 -/5.  Left lower extremity proximal 3+/5, distal PF/DF 4+/5.  Bulk was normal and fasciculations were absent.   Motor Tone - Muscle tone was assessed at the neck and appendages and was normal.  Reflexes - The patient's reflexes were symmetrical in all extremities and he had no pathological reflexes.  Sensory - Light touch, temperature/pinprick were assessed and were symmetrical.    Coordination - The patient had normal movements in the hands with no ataxia or dysmetria although slow on the left.  Tremor was absent.  Gait and Station - deferred.   ASSESSMENT/PLAN [name] is a 70 y.o. male with history of hypertension, prostate cancer, smoking and alcohol abuse admitted for left facial droop and slurred speech. No tPA given due to outside window.    Stroke:  right BG infarct, secondary to high-grade stenosis of right M1, likely due to large vessel disease given all uncontrolled risk factors.  However, echo showed MV abnormality E to rule out vegetation.  MRI right external capsule and carotid body acute infarct.  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  Carotid Doppler unremarkable  2D Echo EF 55 to 60%.  However, mitral Valve severe calcified bulky lesions on posterior and anterior leaflets favor vegetations rather than MAC Suggest TEE to further evaluate.   Plan for  TEE Monday  LDL 115  HgbA1c 6.3  Lovenox for VTE prophylaxis  aspirin 81 mg daily prior to admission, now on aspirin 325 mg daily and clopidogrel 75 mg daily DAPT for 3 months and then Plavix alone given large vessel high-grade stenosis.  Patient counseled to be compliant with his antithrombotic medications  Ongoing aggressive stroke risk factor management  Therapy recommendations:  SNF  Disposition: Pending  Mitral valve abnormality  2D Echo EF 55 to 60%.  However, mitral Valve severe calcified bulky lesions on posterior and anterior leaflets favor  vegetations rather than MAC Suggest TEE to further evaluate.  Plan for TEE Monday  Cardioembolic source of stroke cannot be ruled out if vegetation confirmed.  Hypertension . Stable on the low end . Avoid low BP  Long term BP goal 130-150 given high-grade right MCA stenosis  Hyperlipidemia  Home meds: None  LDL 115, goal < 70  Now on Lipitor 40  Continue statin at discharge  Tobacco abuse  Current smoker  Smoking cessation counseling provided  Pt is willing to quit  Alcohol abuse  Heavy alcohol in the past  Currently still 1 bottle of vodka once a week  Alcohol limitation education provided  Elevated AST 118  Recommend CIWA protocol  Add B1/MVI/FA  Other Stroke Risk Factors  Advanced age  Other Active Problems  Prostate cancer  Hospital day # 0   Rosalin Hawking, MD PhD Stroke Neurology 07/01/2020 5:51 PM    To contact Stroke Continuity provider, please refer to [email or web address] After hours, contact General Neurology

## 2020-07-01 NOTE — Progress Notes (Signed)
Carotid duplex has been completed.  Results can be found under chart review under CV PROC. 07/01/2020 12:14 PM Demosthenes Virnig RVT, RDMS

## 2020-07-01 NOTE — ED Notes (Signed)
Pt is in MRI  

## 2020-07-01 NOTE — ED Notes (Signed)
Pt on bedside commode.

## 2020-07-01 NOTE — Progress Notes (Signed)
PROGRESS NOTE    Cody Douglas  WPY:[id number] DOB: 1950-07-30 DOA: 06/30/2020 PCP: Cody Roers, MD    Brief Narrative:  Cody Douglas was admitted to the hospital with the working diagnosis of acute ischemic infarct, right external capsule and caudate body.  70 year old male past medical history for hypertrophic cardiomyopathy, hyponatremia, hypokalemia, alcohol and tobacco abuse, hypertension and prostate cancer who presented with slurred speech and facial droop.  Family found patient with slight left facial droop and slurred speech apparently acute onset of symptoms.  EMS was called and patient was brought to the hospital.  Patient with ongoing alcohol abuse.  On his initial physical examination blood pressure 133/76, heart rate 106, respiratory rate 22, temperature 95, oxygen saturation 93%, lungs are clear to auscultation bilaterally, heart S1-S2, present rhythmic, soft abdomen, no lower extremity edema.  Positive left facial droop and slurred speech, left upper extremity 4-5/5 strength.  Sodium 134, potassium 3.4, chloride 100, bicarb 23, glucose 94, BUN 11, creatinine 0.58, magnesium 1.9.  White count 12.7, hemoglobin 14.2, hematocrit 43.1, platelets 291. SARS COVID-19 negative.  Brain MRI with acute infarction affecting the external capsule and caudate body on the right.  No mass-effect or hemorrhage.  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  EKG 83 bpm, normal axis, right bundle branch block, sinus rhythm, no st segment or T wave changes.   Assessment & Plan:   Principal Problem:   Acute CVA (cerebrovascular accident) [facility]) Active Problems:   Tobacco abuse   Essential hypertension   Alcohol use   Leukocytosis   CVA (cerebral vascular accident) ([place])   1. 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 Patient's speech is improving but not yet back to baseline, strength decreased left upper extremity 4/5.   MRA severe stenosis right M1, filling  defect, may represent thrombus or atherosclerotic plaque.  Echocardiogram with preserved LV EF but positive severe bulky calcification/ mass on the non coronary cusp and commisure between the right and non cusps.    Continue anticoagulation with aspirin and clopidogrel.  Follow with neurology recommendations, Dr. Erlinda Douglas  Requested TEE, plan for Monday per cardiology.  Follow with PT and OT recommendations.   2. HTN. Blood pressure 112/70 mmHg, off antihypertensive medications, will continue close monitoring.  Holding on lisinopril and hctz. Resume metoprolol to prevent rebound tachycardia.   3. Tobacco and alcohol abuse. No clinical sings of withdrawal, will continue neuro checks per unit protocol. Resume home dose of alprazolam to take as needed for anxiety.   4, leukocytosis. No clinical sings of infection, continue close monitoring of cell count.  Holding on antibiotic therapy,.   5, Chronic pain syndrome. Continue with as needed oxycodone.  Resume cyclobenzaprine at a lower dose.  Patient continue to be at high risk for worsening CVA  Status is: Inpatient  Remains inpatient appropriate because:Inpatient level of care appropriate due to severity of illness   Dispo: The patient is from: Home              Anticipated d/c is to: SNF              Patient currently is not medically stable to d/c.   Difficult to place patient No   DVT prophylaxis: Enoxaparin   Code Status:   full  Family Communication:  I spoke with patient's brother at the bedside, we talked in detail about patient's condition, plan of care and prognosis and all questions were addressed.      Consultants:  Neurology    Subjective: Patient continue to have left sided weakness more upper than lower extremity, no nausea or vomiting, no headache.   Objective: Vitals:   07/01/20 1245 07/01/20 1434 07/01/20 1514 07/01/20 1514  BP: 125/86 122/67 112/70 112/70  Pulse: 82 85 82 83  Resp: (!) 23 20 20 20   Temp:   98.3 F (36.8 C) 98.2 F (36.8 C) 98.2 F (36.8 C)  TempSrc:  Oral Oral Oral  SpO2: 97% 97%  97%    Intake/Output Summary (Last 24 hours) at 07/01/2020 1525 Last data filed at 07/01/2020 9326 Gross per 24 hour  Intake --  Output 175 ml  Net -175 ml   There were no vitals filed for this visit.  Examination:   General: Not in pain or dyspnea, deconditioned  Neurology: Awake and alert, left arm is 4/5 left leg 5/5, right side 5/5 strength.  E ENT: mild pallor, no icterus, oral mucosa moist Cardiovascular: No JVD. S1-S2 present, rhythmic, no gallops, rubs, or murmurs. No lower extremity edema. Pulmonary: positive breath sounds bilaterally, adequate air movement, no wheezing, rhonchi or rales. Gastrointestinal. Abdomen soft and non tender Skin. No rashes Musculoskeletal: no joint deformities     Data Reviewed: I have personally reviewed following labs and imaging studies  CBC: Recent Labs  Lab 06/30/20 1905 06/30/20 1938  WBC 12.7*  --   NEUTROABS 10.2*  --   HGB 14.2 13.9  HCT 43.1 41.0  MCV 95.1  --   PLT 291  --    Basic Metabolic Panel: Recent Labs  Lab 06/30/20 1938 06/30/20 2200  NA 136 134*  K 3.9 3.4*  CL 101 100  CO2  --  23  GLUCOSE 93 94  BUN 15 11  CREATININE 0.50* 0.58*  CALCIUM  --  9.0  MG  --  1.9   GFR: CrCl cannot be calculated (Unknown ideal weight.). Liver Function Tests: Recent Labs  Lab 06/30/20 2200  AST 118*  ALT 33  ALKPHOS 79  BILITOT 0.8  PROT 6.3*  ALBUMIN 3.4*   No results for input(s): LIPASE, AMYLASE in the last 168 hours. No results for input(s): AMMONIA in the last 168 hours. Coagulation Profile: No results for input(s): INR, PROTIME in the last 168 hours. Cardiac Enzymes: Recent Labs  Lab 06/30/20 2200  CKTOTAL 6,158*   BNP (last 3 results) No results for input(s): PROBNP in the last 8760 hours. HbA1C: Recent Labs    07/01/20 0152  HGBA1C 6.3*   CBG: No results for input(s): GLUCAP in the last 168  hours. Lipid Profile: Recent Labs    07/01/20 0153  CHOL 176  HDL 48  LDLCALC 115*  TRIG 67  CHOLHDL 3.7   Thyroid Function Tests: No results for input(s): TSH, T4TOTAL, FREET4, T3FREE, THYROIDAB in the last 72 hours. Anemia Panel: No results for input(s): VITAMINB12, FOLATE, FERRITIN, TIBC, IRON, RETICCTPCT in the last 72 hours.    Radiology Studies: I have reviewed all of the imaging during this hospital visit personally     Scheduled Meds: .  stroke: mapping our early stages of recovery book   Does not apply Once  . aspirin  81 mg Oral Daily  . clopidogrel  75 mg Oral Daily  . enoxaparin (LOVENOX) injection  40 mg Subcutaneous Q24H  . pantoprazole  40 mg Oral Daily   Continuous Infusions:   LOS: 0 days        Shelia Magallon Gerome Apley, MD

## 2020-07-01 NOTE — ED Notes (Signed)
Unable to give report. HUC states that nurse is in a patient room on 3w and if I could call back in 15 mins

## 2020-07-01 NOTE — Progress Notes (Addendum)
Occupational Therapy Evaluation  PTA pt lives alone in a mobile home and manges his own medications, finances and drives. Pt states his friend Francee Piccolo is the one who found him on the floor where he stayed because he could not get up without help. States he typically drink a 5th of Vodka 1 day a week and sometimes falls "because I'm drunk". Pt has a hx of back pain and is on pain meds and muscle relaxers in addition to Xanax and high blood pressure meds. Pt unable to give specifics of managing medications and states he sometimes forgets to take his medication. States he as had increased difficulty preparing food and sometimes he does not eat well. At baseline, pt able to ambulate without an AD, today he required mod A with bed mobility and min A with mobility for ADL @ RW level and is a high risk of falls. Requires min A with ADL tasks. At this time recommend rehab at [facility] to deficits listed below. Will follow acutely.  If pt refuses snf, recommend [facility] services, including HHSW to determine need for APS.     07/01/20 1500  OT Visit Information  Last OT Received On 07/01/20  Assistance Needed +1  History of Present Illness Pt is a 69 y/o male presenting to the ED with slurred speech and facial droop. 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 PMH includes hypertrophic cardiomyopathy, hyponatremia, hypokalemia, chest pain, tobacco use, ETOH abuse,hypertension, prostate cancer.  Precautions  Precautions Fall  Precaution Comments Reports hx of falls  Home Living  Family/patient expects to be discharged to: Private residence  Living Arrangements Alone  Available Help at Discharge Neighbor;Friend(s);Available PRN/intermittently;Family  Type of Home Mobile home  Home Access Stairs to enter  Entrance Stairs-Number of Steps 5  Entrance Stairs-Rails None  Home Layout One level  Bathroom Shower/Tub Tub/shower unit;Curtain  Chief Strategy Officer No  Cytogeneticist   Lives With Alone  Prior Function  Level of Independence Independent  Comments drives; does his own finances and medication management.  "sometimes i forget to take the ones in the evening"  Communication  Communication No difficulties  Pain Assessment  Pain Assessment Faces  Faces Pain Scale 4  Pain Location back  Pain Descriptors / Indicators Aching  Pain Intervention(s) Limited activity within patient's tolerance  Cognition  Arousal/Alertness Awake/alert  Behavior During Therapy Flat affect  Overall Cognitive Status No family/caregiver present to determine baseline cognitive functioning (suspected to be impaired greater than baseline due to deficits exhibited with functional tasks per PT notes)  Area of Impairment Attention;Memory;Awareness;Safety/judgement;Problem solving  Current Attention Level Selective  Memory Decreased recall of precautions  Safety/Judgement Decreased awareness of safety;Decreased awareness of deficits  Awareness Emergent  Problem Solving Slow processing  General Comments Decreased safety awareness. Reporting conflicting information throughout session.  Upper Extremity Assessment  Upper Extremity Assessment Generalized weakness - LUE appears weaker than R, decreased fine motor and in hand manipulation skills; will further assess  Lower Extremity Assessment  Lower Extremity Assessment Defer to PT evaluation  Cervical / Trunk Assessment  Cervical / Trunk Assessment Kyphotic  ADL  Overall ADL's  Needs assistance/impaired  Eating/Feeding Modified independent  Grooming Set up;Sitting  Upper Body Bathing Set up;Sitting  Lower Body Bathing Minimal assistance;Sit to/from stand  Upper Body Dressing  Set up;Supervision/safety;Sitting  Lower Body Dressing Minimal assistance;Sit to/from stand  Toilet Transfer Minimal assistance;RW;Grab bars;Comfort height toilet  Toileting- Clothing  Manipulation and Hygiene Minimal assistance  Functional mobility during ADLs Minimal assistance;Cueing for safety;Cueing for sequencing;Rolling walker  General ADL Comments Poor orientation of self in RW when wakling. Pt with poor posture, leaning over RW with RW offset to the R, almost as if walking beside it  Vision- History  Patient Visual Report No change from baseline  Vision- Assessment  Additional Comments will assess further  Perception  Comments poor spatial awareness; unaware of running therapistinto wall on his R; running into walls/doors with RW  Praxis  Praxis tested? WFL  Bed Mobility  Overal bed mobility Needs Assistance  Bed Mobility Supine to Sit;Sit to Supine  Supine to sit Mod assist  Sit to supine Mod assist  General bed mobility comments Mod A for LE assist to return to supine.  Transfers  Overall transfer level Needs assistance  Equipment used Rolling walker (2 wheeled)  Transfers Sit to/from Stand  Sit to Stand Min assist  General transfer comment Min A for steadying assist to stand. CUes for hand placement.  Balance  Overall balance assessment Needs assistance  Sitting-balance support No upper extremity supported;Feet supported  Sitting balance-Leahy Scale Fair  Standing balance support Bilateral upper extremity supported;During functional activity  Standing balance-Leahy Scale Poor  Standing balance comment Reliant on BUE support  General Comments  General comments (skin integrity, edema, etc.) unkempt appearance; increased SOB with minimal activity  OT - End of Session  Equipment Utilized During Treatment Rolling walker;Gait belt  Activity Tolerance Patient tolerated treatment well  Patient left in bed;with call bell/phone within reach Estate manager/land agent)  Nurse Communication Mobility status  OT Assessment  OT Recommendation/Assessment Patient needs continued OT Services  OT Visit Diagnosis Other abnormalities of gait and mobility (R26.89);Repeated falls  (R29.6);Muscle weakness (generalized) (M62.81);Other symptoms and signs involving cognitive function;Pain  Pain - part of body  (back)  OT Problem List Decreased strength;Decreased range of motion;Decreased activity tolerance;Impaired balance (sitting and/or standing);Decreased cognition;Decreased safety awareness;Decreased knowledge of use of DME or AE;Decreased knowledge of precautions;Cardiopulmonary status limiting activity;Impaired UE functional use;Pain  Barriers to Discharge Decreased caregiver support  OT Plan  OT Frequency (ACUTE ONLY) Min 2X/week  OT Treatment/Interventions (ACUTE ONLY) Self-care/ADL training;Therapeutic exercise;Neuromuscular education;Energy conservation;DME and/or AE instruction;Therapeutic activities;Cognitive remediation/compensation;Patient/family education;Balance training  AM-PAC OT "6 Clicks" Daily Activity Outcome Measure (Version 2)  Help from another person eating meals? 4  Help from another person taking care of personal grooming? 3  Help from another person toileting, which includes using toliet, bedpan, or urinal? 3  Help from another person bathing (including washing, rinsing, drying)? 3  Help from another person to put on and taking off regular upper body clothing? 3  Help from another person to put on and taking off regular lower body clothing? 3  6 Click Score 19  OT Recommendation  Follow Up Recommendations SNF;Supervision/Assistance - 24 hour  OT Equipment 3 in 1 bedside commode  Individuals Consulted  Consulted and Agree with Results and Recommendations Patient  Acute Rehab OT Goals  Patient Stated Goal none stated  OT Goal Formulation With patient  Time For Goal Achievement 07/15/20  Potential to Achieve Goals Good  OT Time Calculation  OT Start Time (ACUTE ONLY) 1255  OT Stop Time (ACUTE ONLY) 1315  OT Time Calculation (min) 20 min  OT General Charges  $OT Visit 1 Visit  OT Evaluation  $OT Eval Moderate Complexity 1 Mod  Written  Expression  Dominant Hand Right  Cavion Faiola, OT/L   Acute OT  [place] Pager [phone] Office [phone]

## 2020-07-01 NOTE — Evaluation (Signed)
Physical Therapy Evaluation Patient Details Name: Cody Douglas MRN: [id number] DOB: 1951-02-16 Today's Date: 07/01/2020   History of Present Illness  Pt is a 70 y/o male presenting to the ED with slurred speech and facial droop. MRI revealed R external capsule and caudate body infarct. PMH includes hypertrophic cardiomyopathy, hyponatremia, hypokalemia, chest pain, tobacco use, hypertension, prostate cancer.  Clinical Impression  Pt admitted secondary to problem above with deficits below. Pt with decreased safety awareness and demonstrated decreased safety during mobility. Required consistent cues about appropriate use of RW. Would run into objects using RW. Min A for steadying assist throughout. Pt currently lives alone and feel he is unable to care for himself given current deficits. Recommending SNF level therapies at d/c to increase independence and safety with functional mobility. Will continue to follow acutely.     Follow Up Recommendations SNF;Supervision/Assistance - 24 hour    Equipment Recommendations  Rolling walker with 5" wheels;3in1 (PT)    Recommendations for Other Services       Precautions / Restrictions Precautions Precautions: Fall Precaution Comments: Reports hx of falls Restrictions Weight Bearing Restrictions: No      Mobility  Bed Mobility Overal bed mobility: Needs Assistance Bed Mobility: Supine to Sit     Supine to sit: Mod assist     General bed mobility comments: Mod A for LE assist to return to supine.    Transfers Overall transfer level: Needs assistance Equipment used: Rolling walker (2 wheeled) Transfers: Sit to/from Stand Sit to Stand: Min assist         General transfer comment: Min A for steadying assist to stand. CUes for hand placement.  Ambulation/Gait Ambulation/Gait assistance: Min assist Gait Distance (Feet): 25 Feet Assistive device: Rolling walker (2 wheeled) Gait Pattern/deviations: Step-through pattern;Decreased  stride length;Staggering left;Staggering right;Drifts right/left Gait velocity: Decreased   General Gait Details: Pt running into bed on the right. Poor awareness of how to safely use RW. Required consistent cues for proximity to device as pt would walk outside of RW. Min A for steadying.  Stairs            Wheelchair Mobility    Modified Rankin (Stroke Patients Only) Modified Rankin (Stroke Patients Only) Pre-Morbid Rankin Score: No significant disability Modified Rankin: Moderately severe disability     Balance Overall balance assessment: Needs assistance Sitting-balance support: No upper extremity supported;Feet supported Sitting balance-Leahy Scale: Fair     Standing balance support: Bilateral upper extremity supported;During functional activity Standing balance-Leahy Scale: Poor Standing balance comment: Reliant on BUE support                             Pertinent Vitals/Pain Pain Assessment: Faces Faces Pain Scale: Hurts little more Pain Location: back Pain Descriptors / Indicators: Aching Pain Intervention(s): Limited activity within patient's tolerance;Monitored during session;Repositioned    Home Living Family/patient expects to be discharged to:: Private residence Living Arrangements: Alone Available Help at Discharge: Neighbor;Friend(s);Available PRN/intermittently;Family Type of Home: Mobile home Home Access: Stairs to enter Entrance Stairs-Rails: None Entrance Stairs-Number of Steps: 5 Home Layout: One level Home Equipment: Shower seat      Prior Function Level of Independence: Independent         Comments: drives; does his own finances and medication management.  "sometimes i forget to take the ones in the evening"     Hand Dominance        Extremity/Trunk Assessment   Upper Extremity Assessment Upper  Extremity Assessment: Defer to OT evaluation    Lower Extremity Assessment Lower Extremity Assessment: Generalized  weakness    Cervical / Trunk Assessment Cervical / Trunk Assessment: Kyphotic  Communication   Communication: No difficulties  Cognition Arousal/Alertness: Awake/alert Behavior During Therapy: Flat affect Overall Cognitive Status: No family/caregiver present to determine baseline cognitive functioning                                 General Comments: Decreased safety awareness. Reporting conflicting information throughout session.      General Comments      Exercises     Assessment/Plan    PT Assessment Patient needs continued PT services  PT Problem List Decreased strength;Decreased activity tolerance;Decreased balance;Decreased mobility;Decreased knowledge of use of DME;Decreased cognition;Decreased safety awareness;Decreased knowledge of precautions       PT Treatment Interventions Gait training;DME instruction;Stair training;Functional mobility training;Therapeutic activities;Therapeutic exercise;Balance training;Patient/family education    PT Goals (Current goals can be found in the Care Plan section)  Acute Rehab PT Goals Patient Stated Goal: none stated PT Goal Formulation: With patient Time For Goal Achievement: 07/15/20 Potential to Achieve Goals: Fair    Frequency Min 3X/week   Barriers to discharge        Co-evaluation               AM-PAC PT "6 Clicks" Mobility  Outcome Measure Help needed turning from your back to your side while in a flat bed without using bedrails?: A Little Help needed moving from lying on your back to sitting on the side of a flat bed without using bedrails?: A Little Help needed moving to and from a bed to a chair (including a wheelchair)?: A Little Help needed standing up from a chair using your arms (e.g., wheelchair or bedside chair)?: A Little Help needed to walk in hospital room?: A Little Help needed climbing 3-5 steps with a railing? : A Lot 6 Click Score: 17    End of Session Equipment Utilized  During Treatment: Gait belt Activity Tolerance: Patient tolerated treatment well Patient left: in bed;with call bell/phone within reach;Other (comment) (on stretcher in ED with transport staff present) Nurse Communication: Mobility status PT Visit Diagnosis: Unsteadiness on feet (R26.81);Muscle weakness (generalized) (M62.81);History of falling (Z91.81)    Time: 1310-1320 PT Time Calculation (min) (ACUTE ONLY): 10 min   Charges:   PT Evaluation $PT Eval Moderate Complexity: 1 Mod          Reuel Derby, PT, DPT  Acute Rehabilitation Services  Pager: [phone] Office: [phone]   Rudean Hitt 07/01/2020, 2:21 PM

## 2020-07-02 DIAGNOSIS — Z7289 Other problems related to lifestyle: Secondary | ICD-10-CM | POA: Diagnosis not present

## 2020-07-02 DIAGNOSIS — I639 Cerebral infarction, unspecified: Secondary | ICD-10-CM | POA: Diagnosis not present

## 2020-07-02 DIAGNOSIS — Z720 Tobacco use: Secondary | ICD-10-CM | POA: Diagnosis not present

## 2020-07-02 DIAGNOSIS — D72829 Elevated white blood cell count, unspecified: Secondary | ICD-10-CM | POA: Diagnosis not present

## 2020-07-02 NOTE — TOC Initial Note (Addendum)
Transition of Care [facility]) - Initial/Assessment Note    Patient Details  Name: Cody Douglas MRN: [id number] Date of Birth: 04/13/1950  Transition of Care [facility] CM/SW Contact:    Geralynn Ochs, LCSW Phone Number: 07/02/2020, 3:43 PM  Clinical Narrative:        CSW met with patient to discuss recommendation for SNF placement. Patient said he was worried about costs, and CSW discussed Medicare coverage for SNF. Patient said he thought that would be alright, gave permission to fax out referral. CSW then followed up this afternoon with bed offers. Patient to review, and CSW to follow with him on choice.          Patient has been vaccinated and boosted.  Expected Discharge Plan: Skilled Nursing Facility Barriers to Discharge: Insurance Authorization,Continued Medical Work up,Active Substance Use - Placement   Patient Goals and CMS Choice Patient states their goals for this hospitalization and ongoing recovery are:: to get back home CMS Medicare.gov Compare Post Acute Care list provided to:: Patient Choice offered to / list presented to : Patient  Expected Discharge Plan and Services Expected Discharge Plan: [place] Choice: [place] arrangements for the past 2 months: Single Family Home                                      Prior Living Arrangements/Services Living arrangements for the past 2 months: Single Family Home Lives with:: Self Patient language and need for interpreter reviewed:: No Do you feel safe going back to the place where you live?: Yes      Need for Family Participation in Patient Care: No (Comment) Care giver support system in place?: No (comment)   Criminal Activity/Legal Involvement Pertinent to Current Situation/Hospitalization: No - Comment as needed  Activities of Daily Living      Permission Sought/Granted Permission sought to share information with : Research scientist (medical) granted to share information with : Yes, Verbal Permission Granted     Permission granted to share info w AGENCY: SNF        Emotional Assessment Appearance:: Appears stated age Attitude/Demeanor/Rapport: Engaged Affect (typically observed): Appropriate Orientation: : Oriented to Self,Oriented to Place,Oriented to  Time,Oriented to Situation Alcohol / Substance Use: Alcohol Use Psych Involvement: No (comment)  Admission diagnosis:  Alcohol abuse [F10.10] CVA (cerebral vascular accident) ([place]) [I63.9] Acute CVA (cerebrovascular accident) [facility]) [I63.9] Patient Active Problem List   Diagnosis Date Noted  . CVA (cerebral vascular accident) ([place]) 07/01/2020  . Acute CVA (cerebrovascular accident) ([place]) 06/30/2020  . Essential hypertension 06/30/2020  . Alcohol use 06/30/2020  . Leukocytosis 06/30/2020  . Hypertrophic cardiomyopathy ([place]) 12/14/2013  . Abnormal ECG 12/13/2013  . CAP (community acquired pneumonia) 12/13/2013  . Chest pain 12/12/2013  . Hyponatremia 12/12/2013  . Hypokalemia 12/12/2013  . Tobacco abuse 12/12/2013   PCP:  Tamsen Roers, MD Pharmacy:   [facility] [address] [place]), [place] - [address] [place] ([place]) [place] [address] Phone: [phone] Fax: [phone]     Social Determinants of Health (SDOH) Interventions    Readmission Risk Interventions No flowsheet data found.

## 2020-07-02 NOTE — Plan of Care (Signed)
  Problem: Education: Goal: Knowledge of General Education information will improve Description: Including pain rating scale, medication(s)/side effects and non-pharmacologic comfort measures Outcome: Progressing   Problem: Health Behavior/Discharge Planning: Goal: Ability to manage health-related needs will improve Outcome: Progressing   Problem: Clinical Measurements: Goal: Ability to maintain clinical measurements within normal limits will improve Outcome: Progressing Goal: Will remain free from infection Outcome: Progressing Goal: Diagnostic test results will improve Outcome: Progressing Goal: Respiratory complications will improve Outcome: Progressing Goal: Cardiovascular complication will be avoided Outcome: Progressing   Problem: Activity: Goal: Risk for activity intolerance will decrease Outcome: Progressing   Problem: Nutrition: Goal: Adequate nutrition will be maintained Outcome: Progressing   Problem: Coping: Goal: Level of anxiety will decrease Outcome: Progressing   Problem: Elimination: Goal: Will not experience complications related to bowel motility Outcome: Progressing Goal: Will not experience complications related to urinary retention Outcome: Progressing   Problem: Pain Managment: Goal: General experience of comfort will improve Outcome: Progressing   Problem: Safety: Goal: Ability to remain free from injury will improve Outcome: Progressing   Problem: Skin Integrity: Goal: Risk for impaired skin integrity will decrease Outcome: Progressing   Problem: Education: Goal: Knowledge of disease or condition will improve Outcome: Progressing Goal: Knowledge of secondary prevention will improve Outcome: Progressing Goal: Knowledge of patient specific risk factors addressed and post discharge goals established will improve Outcome: Progressing   Problem: Coping: Goal: Will verbalize positive feelings about self Outcome: Progressing Goal: Will  identify appropriate support needs Outcome: Progressing   Problem: Self-Care: Goal: Ability to participate in self-care as condition permits will improve Outcome: Progressing   Problem: Nutrition: Goal: Risk of aspiration will decrease Outcome: Progressing

## 2020-07-02 NOTE — Progress Notes (Signed)
STROKE TEAM PROGRESS NOTE   SUBJECTIVE (INTERVAL HISTORY) His daughter is at the bedside.  Overnight no acute issue. Pt no complains. Pending TEE on Monday. Stroke risk factor modification education again provided.   OBJECTIVE Temp:  [97.5 F (36.4 C)-98.2 F (36.8 C)] 97.9 F (36.6 C) (04/22 1517) Pulse Rate:  [75-86] 75 (04/22 1517) Cardiac Rhythm: Normal sinus rhythm (04/22 0950) Resp:  [20] 20 (04/22 0647) BP: (98-126)/(60-74) 113/60 (04/22 1517) SpO2:  [94 %-98 %] 97 % (04/22 1517)  No results for input(s): GLUCAP in the last 168 hours. Recent Labs  Lab 06/30/20 1938 06/30/20 2200  NA 136 134*  K 3.9 3.4*  CL 101 100  CO2  --  23  GLUCOSE 93 94  BUN 15 11  CREATININE 0.50* 0.58*  CALCIUM  --  9.0  MG  --  1.9   Recent Labs  Lab 06/30/20 2200  AST 118*  ALT 33  ALKPHOS 79  BILITOT 0.8  PROT 6.3*  ALBUMIN 3.4*   Recent Labs  Lab 06/30/20 1905 06/30/20 1938  WBC 12.7*  --   NEUTROABS 10.2*  --   HGB 14.2 13.9  HCT 43.1 41.0  MCV 95.1  --   PLT 291  --    Recent Labs  Lab 06/30/20 2200  CKTOTAL 6,158*   No results for input(s): LABPROT, INR in the last 72 hours. No results for input(s): COLORURINE, LABSPEC, [place], GLUCOSEU, HGBUR, BILIRUBINUR, KETONESUR, PROTEINUR, UROBILINOGEN, NITRITE, LEUKOCYTESUR in the last 72 hours.  Invalid input(s): APPERANCEUR     Component Value Date/Time   CHOL 176 07/01/2020 0153   TRIG 67 07/01/2020 0153   HDL 48 07/01/2020 0153   CHOLHDL 3.7 07/01/2020 0153   VLDL 13 07/01/2020 0153   LDLCALC 115 (H) 07/01/2020 0153   Lab Results  Component Value Date   HGBA1C 6.3 (H) 07/01/2020      Component Value Date/Time   LABOPIA NONE DETECTED 07/01/2020 1633   COCAINSCRNUR NONE DETECTED 07/01/2020 1633   LABBENZ POSITIVE (A) 07/01/2020 1633   AMPHETMU NONE DETECTED 07/01/2020 1633   THCU NONE DETECTED 07/01/2020 1633   LABBARB NONE DETECTED 07/01/2020 1633    No results for input(s): ETH in the last 168  hours.  I have personally reviewed the radiological images below and agree with the radiology interpretations.  MR ANGIO HEAD WO CONTRAST  Result Date: 07/01/2020 CLINICAL DATA:  Stroke EXAM: MRA HEAD WITHOUT CONTRAST TECHNIQUE: Angiographic images of the Circle of Willis were obtained using MRA technique without intravenous contrast. COMPARISON:  MRI head 06/30/2020 FINDINGS: Internal carotid artery widely patent and normal bilaterally. Severe stenosis right M1 segment. There appears to be associated filling defect. This could be atherosclerotic disease and or thrombus. This extends to the right MCA bifurcation. Right M2 branches patent with decreased caliber compared to the left. Fenestrated left M1 segment. Left MCA widely patent. Both anterior cerebral arteries widely patent. Basilar widely patent. PICA patent bilaterally. Both vertebral arteries widely patent. Posterior cerebral arteries widely patent with fetal origin on the right. IMPRESSION: Severe stenosis right M1 segment. Associated filling defect may represent thrombus and or atherosclerotic plaque. Decreased perfusion right MCA branches which remains patent. No other significant cranial stenosis Code stroke imaging results were communicated on 07/01/2020 at 2:11 pm to provider Erlinda Hong via text page Electronically Signed   By: Franchot Gallo M.D.   On: 07/01/2020 14:13   MR BRAIN WO CONTRAST  Result Date: 06/30/2020 CLINICAL DATA:  Transient ischemic attack. EXAM:  MRI HEAD WITHOUT CONTRAST TECHNIQUE: Multiplanar, multiecho pulse sequences of the brain and surrounding structures were obtained without intravenous contrast. COMPARISON:  None. FINDINGS: Brain: Diffusion imaging shows acute infarction within the external capsule and caudate body on the right. No evidence of mass effect or hemorrhage. No other acute insult. Elsewhere, the brainstem is normal. Old small vessel cerebellar infarctions inferior on the left. Old left frontal cortical and  subcortical infarction. No sign of mass lesion, hemorrhage, hydrocephalus or extra-axial collection. Vascular: Major vessels at the base of the brain show flow. Skull and upper cervical spine: Negative Sinuses/Orbits: Clear/normal Other: None IMPRESSION: 1. Acute infarction affecting the external capsule and caudate body on the right. No mass effect or hemorrhage. 2. 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 Electronically Signed   By: Nelson Chimes M.D.   On: 06/30/2020 20:30   ECHOCARDIOGRAM COMPLETE  Result Date: 07/01/2020    ECHOCARDIOGRAM REPORT   Patient Name:   JERRIAN MELLS Date of Exam: 07/01/2020 Medical Rec #:  [id number]      Height:       71.0 in Accession #:    [id number]     Weight:       170.0 lb Date of Birth:  07/22/1950      BSA:          1.968 m Patient Age:    70 years       BP:           125/79 mmHg Patient Gender: M              HR:           77 bpm. Exam Location:  Inpatient Procedure: 2D Echo, Cardiac Doppler and Color Doppler Indications:    CVA  History:        Patient has no prior history of Echocardiogram examinations.                 Risk Factors:Hypertension.  Sonographer:    Luisa Hart RDCS Referring Phys: [id number] [place] Comments: No cardiac surgery or procedures noted in chart IMPRESSIONS  1. Left ventricular ejection fraction, by estimation, is 55 to 60%. The left ventricle has normal function. The left ventricle has no regional wall motion abnormalities. Left ventricular diastolic parameters are consistent with Grade I diastolic dysfunction (impaired relaxation).  2. Right ventricular systolic function is normal. The right ventricular size is normal. There is normal pulmonary artery systolic pressure.  3. Severe calcified bulky lesions on posterior and anterior leaflets Despite calcification edges/borders seem irregular and favor vegetations rather than MAC Suggest TEE to further evaluate. The mitral valve is abnormal. Mild mitral valve  regurgitation.  No evidence of mitral stenosis.  4. Severe bulky calcification/ mass particularly on the Non coronary cusp and commisure between the right and non cusps. Again despite calcification edges irregular and suggest vegetations TEE and cardiology consult suggested . The aortic valve is abnormal. Aortic valve regurgitation is mild. No aortic stenosis is present.  5. The inferior vena cava is normal in size with greater than 50% respiratory variability, suggesting right atrial pressure of 3 mmHg. FINDINGS  Left Ventricle: Left ventricular ejection fraction, by estimation, is 55 to 60%. The left ventricle has normal function. The left ventricle has no regional wall motion abnormalities. The left ventricular internal cavity size was normal in size. There is  no left ventricular hypertrophy. Left ventricular diastolic parameters are consistent with Grade I diastolic dysfunction (impaired relaxation). Right  Ventricle: The right ventricular size is normal. No increase in right ventricular wall thickness. Right ventricular systolic function is normal. There is normal pulmonary artery systolic pressure. The tricuspid regurgitant velocity is 2.56 m/s, and  with an assumed right atrial pressure of 3 mmHg, the estimated right ventricular systolic pressure is 28.6 mmHg. Left Atrium: Left atrial size was normal in size. Right Atrium: Right atrial size was normal in size. Pericardium: There is no evidence of pericardial effusion. Mitral Valve: Severe calcified bulky lesions on posterior and anterior leaflets Despite calcification edges/borders seem irregular and favor vegetations rather than MAC Suggest TEE to further evaluate. The mitral valve is abnormal. Mild mitral valve regurgitation. No evidence of mitral valve stenosis. MV peak gradient, 8.1 mmHg. The mean mitral valve gradient is 4.0 mmHg. Tricuspid Valve: The tricuspid valve is normal in structure. Tricuspid valve regurgitation is not demonstrated. No evidence  of tricuspid stenosis. Aortic Valve: Severe bulky calcification/ mass particularly on the Non coronary cusp and commisure between the right and non cusps. Again despite calcification edges irregular and suggest vegetations TEE and cardiology consult suggested. The aortic valve  is abnormal. Aortic valve regurgitation is mild. Aortic regurgitation PHT measures 385 msec. No aortic stenosis is present. Aortic valve mean gradient measures 9.5 mmHg. Aortic valve peak gradient measures 15.9 mmHg. Aortic valve area, by VTI measures 5.87 cm. Pulmonic Valve: The pulmonic valve was normal in structure. Pulmonic valve regurgitation is not visualized. No evidence of pulmonic stenosis. Aorta: The aortic root is normal in size and structure. Venous: The inferior vena cava is normal in size with greater than 50% respiratory variability, suggesting right atrial pressure of 3 mmHg. IAS/Shunts: No atrial level shunt detected by color flow Doppler.  LEFT VENTRICLE PLAX 2D LVIDd:         5.20 cm     Diastology LVIDs:         4.00 cm     LV e' medial:    4.35 cm/s LV PW:         0.70 cm     LV E/e' medial:  21.0 LV IVS:        1.00 cm     LV e' lateral:   7.62 cm/s LVOT diam:     2.70 cm     LV E/e' lateral: 12.0 LV SV:         226 LV SV Index:   115 LVOT Area:     5.73 cm  LV Volumes (MOD) LV vol d, MOD A2C: 79.2 ml LV vol d, MOD A4C: 56.4 ml LV vol s, MOD A2C: 29.7 ml LV vol s, MOD A4C: 21.6 ml LV SV MOD A2C:     49.5 ml LV SV MOD A4C:     56.4 ml LV SV MOD BP:      48.8 ml RIGHT VENTRICLE RV S prime:     12.10 cm/s TAPSE (M-mode): 1.5 cm LEFT ATRIUM             Index       RIGHT ATRIUM           Index LA diam:        3.60 cm 1.83 cm/m  RA Area:     13.80 cm LA Vol (A2C):   31.8 ml 16.16 ml/m RA Volume:   32.60 ml  16.57 ml/m LA Vol (A4C):   46.7 ml 23.73 ml/m LA Biplane Vol: 39.5 ml 20.07 ml/m  AORTIC VALVE  PULMONIC VALVE AV Area (Vmax):    5.85 cm     PV Vmax:       0.66 m/s AV Area (Vmean):   5.39 cm      PV Peak grad:  1.8 mmHg AV Area (VTI):     5.87 cm AV Vmax:           199.50 cm/s AV Vmean:          143.500 cm/s AV VTI:            0.384 m AV Peak Grad:      15.9 mmHg AV Mean Grad:      9.5 mmHg LVOT Vmax:         204.00 cm/s LVOT Vmean:        135.000 cm/s LVOT VTI:          0.394 m LVOT/AV VTI ratio: 1.03 AI PHT:            385 msec  AORTA Ao Root diam: 4.00 cm MITRAL VALVE                TRICUSPID VALVE MV Area (PHT): 2.09 cm     TR Peak grad:   26.2 mmHg MV Area VTI:   5.32 cm     TR Vmax:        256.00 cm/s MV Peak grad:  8.1 mmHg MV Mean grad:  4.0 mmHg     SHUNTS MV Vmax:       1.42 m/s     Systemic VTI:  0.39 m MV Vmean:      90.8 cm/s    Systemic Diam: 2.70 cm MV Decel Time: 363 msec MR Peak grad: 94.5 mmHg MR Vmax:      486.00 cm/s MV E velocity: 91.30 cm/s MV A velocity: 145.00 cm/s MV E/A ratio:  0.63 Jenkins Rouge MD Electronically signed by Jenkins Rouge MD Signature Date/Time: 07/01/2020/10:07:46 AM    Final    VAS US CAROTID (at [facility] and [facility] only)  Result Date: 07/01/2020 Carotid Arterial Duplex Study Indications:       CVA and Slurred speech & facial droop (left). Risk Factors:      Hypertension, current smoker. Other Factors:     ETOH abuse. Comparison Study:  No previous exams Performing Technologist: Rogelia Rohrer  Examination Guidelines: A complete evaluation includes B-mode imaging, spectral Doppler, color Doppler, and power Doppler as needed of all accessible portions of each vessel. Bilateral testing is considered an integral part of a complete examination. Limited examinations for reoccurring indications may be performed as noted.  Right Carotid Findings: +----------+--------+--------+--------+------------------+------------------+           PSV cm/sEDV cm/sStenosisPlaque DescriptionComments           +----------+--------+--------+--------+------------------+------------------+ CCA Prox  83      12                                intimal thickening  +----------+--------+--------+--------+------------------+------------------+ CCA Distal86      16                                intimal thickening +----------+--------+--------+--------+------------------+------------------+ ICA Prox  53      10                                                   +----------+--------+--------+--------+------------------+------------------+  ICA Distal58      16                                                   +----------+--------+--------+--------+------------------+------------------+ ECA       125     8                                                    +----------+--------+--------+--------+------------------+------------------+ +----------+--------+-------+----------------+-------------------+           PSV cm/sEDV cmsDescribe        Arm Pressure (mmHG) +----------+--------+-------+----------------+-------------------+ [id number]             Multiphasic, WNL                    +----------+--------+-------+----------------+-------------------+ +---------+--------+--+--------+-+---------+ VertebralPSV cm/s35EDV cm/s7Antegrade +---------+--------+--+--------+-+---------+  Left Carotid Findings: +----------+--------+--------+--------+------------------+------------------+           PSV cm/sEDV cm/sStenosisPlaque DescriptionComments           +----------+--------+--------+--------+------------------+------------------+ CCA Prox  84      15                                intimal thickening +----------+--------+--------+--------+------------------+------------------+ CCA Distal80      19                                intimal thickening +----------+--------+--------+--------+------------------+------------------+ ICA Prox  64      20                                                   +----------+--------+--------+--------+------------------+------------------+ ICA Distal76      26                                                    +----------+--------+--------+--------+------------------+------------------+ ECA       179     19                                                   +----------+--------+--------+--------+------------------+------------------+ +----------+--------+--------+----------------+-------------------+           PSV cm/sEDV cm/sDescribe        Arm Pressure (mmHG) +----------+--------+--------+----------------+-------------------+ [id number]              Multiphasic, WNL                    +----------+--------+--------+----------------+-------------------+ +---------+--------+--------+--------------+ VertebralPSV cm/sEDV cm/sNot identified +---------+--------+--------+--------------+   Summary: Right Carotid: The extracranial vessels were near-normal with only minimal wall                thickening or plaque. Left Carotid: The extracranial vessels were near-normal with only minimal wall  thickening or plaque. Vertebrals:  Right vertebral artery demonstrates antegrade flow. Left vertebral              artery was not visualized. Subclavians: Normal flow hemodynamics were seen in bilateral subclavian              arteries. *See table(s) above for measurements and observations.  Electronically signed by Harold Barban MD on 07/01/2020 at 9:27:56 PM.    Final     PHYSICAL EXAM  Temp:  [97.5 F (36.4 C)-98.2 F (36.8 C)] 97.9 F (36.6 C) (04/22 1517) Pulse Rate:  [75-86] 75 (04/22 1517) Resp:  [20] 20 (04/22 0647) BP: (98-126)/(60-74) 113/60 (04/22 1517) SpO2:  [94 %-98 %] 97 % (04/22 1517)  General - Well nourished, well developed, in no apparent distress.  Ophthalmologic - fundi not visualized due to noncooperation.  Cardiovascular - Regular rhythm and rate.  Mental Status -  Level of arousal and orientation to time, place, and person were intact. Language including expression, naming, repetition, comprehension was assessed and found  intact.  Cranial Nerves II - XII - II - Visual field intact OU. III, IV, VI - Extraocular movements intact. V - Facial sensation intact bilaterally. VII - Facial movement intact bilaterally. VIII - Hearing & vestibular intact bilaterally. X - Palate elevates symmetrically.  Mild dysarthria with poor denture XI - Chin turning & shoulder shrug intact bilaterally. XII - Tongue protrusion intact.  Motor Strength - The patient's strength was normal in right upper and lower extremities, left upper extremity proximal 3+/5, bicep tricep and finger grip 5 -/5.  Left lower extremity proximal 3+/5, distal PF/DF 4+/5.  Bulk was normal and fasciculations were absent.   Motor Tone - Muscle tone was assessed at the neck and appendages and was normal.  Reflexes - The patient's reflexes were symmetrical in all extremities and he had no pathological reflexes.  Sensory - Light touch, temperature/pinprick were assessed and were symmetrical.    Coordination - The patient had normal movements in the hands with no ataxia or dysmetria although slow on the left.  Tremor was absent.  Gait and Station - deferred.   ASSESSMENT/PLAN [name] is a 70 y.o. male with history of hypertension, prostate cancer, smoking and alcohol abuse admitted for left facial droop and slurred speech. No tPA given due to outside window.    Stroke:  right BG infarct, secondary to high-grade stenosis of right M1, likely due to large vessel disease given all uncontrolled risk factors.  However, echo showed MV abnormality E to rule out vegetation.  MRI right external capsule and carotid body acute infarct.  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  Carotid Doppler unremarkable  2D Echo EF 55 to 60%.  However, mitral Valve severe calcified bulky lesions on posterior and anterior leaflets favor vegetations  rather than MAC Suggest TEE to further evaluate.   Plan for TEE Monday  LDL 115  HgbA1c 6.3  Lovenox for VTE prophylaxis  aspirin 81 mg daily prior to admission, now on aspirin 325 mg daily and clopidogrel 75 mg daily DAPT for 3 months and then Plavix alone given large vessel high-grade stenosis.  Patient counseled to be compliant with his antithrombotic medications  Ongoing aggressive stroke risk factor management  Therapy recommendations:  SNF  Disposition: Pending  Mitral valve abnormality  2D Echo EF 55 to 60%.  However, mitral  Valve severe calcified bulky lesions on posterior and anterior leaflets favor vegetations rather than MAC Suggest TEE to further evaluate.  Plan for TEE Monday  Cardioembolic source of stroke cannot be ruled out if vegetation confirmed.  Hypertension . Stable on the low end . Avoid low BP  Long term BP goal 130-150 given high-grade right MCA stenosis  Hyperlipidemia  Home meds: None  LDL 115, goal < 70  Now on Lipitor 40  Continue statin at discharge  Tobacco abuse  Current smoker  Smoking cessation counseling provided  Pt is willing to quit  Alcohol abuse  Heavy alcohol in the past  Currently still 1 bottle of vodka once a week  Alcohol limitation education provided  Elevated AST 118  Recommend CIWA protocol  Add B1/MVI/FA  Other Stroke Risk Factors  Advanced age  Other Active Problems  Prostate cancer  Hospital day # 1  Neurology will sign off. Please call with questions. Pt will follow up with stroke clinic NP at [facility] in about 4 weeks. Thanks for the consult.   Rosalin Hawking, MD PhD Stroke Neurology 07/02/2020 5:38 PM    To contact Stroke Continuity provider, please refer to [email or web address] After hours, contact General Neurology

## 2020-07-02 NOTE — Progress Notes (Addendum)
PROGRESS NOTE    Cody Douglas  QQV:[id number] DOB: Jun 17, 1950 DOA: 06/30/2020 PCP: Tamsen Roers, MD    Brief Narrative:  [name] was admitted to the hospital with the working diagnosis of acute ischemic infarct, right external capsule and caudate body.  70 year old male past medical history for hypertrophic cardiomyopathy, hyponatremia, hypokalemia, alcohol and tobacco abuse, hypertension and prostate cancer who presented with slurred speech and facial droop.  Family found patient with slight left facial droop and slurred speech apparently acute onset of symptoms.  EMS was called and patient was brought to the hospital.  Patient with ongoing alcohol abuse.  On his initial physical examination blood pressure 133/76, heart rate 106, respiratory rate 22, temperature 95, oxygen saturation 93%, lungs are clear to auscultation bilaterally, heart S1-S2, present rhythmic, soft abdomen, no lower extremity edema.  Positive left facial droop and slurred speech, left upper extremity 4-5/5 strength.  Sodium 134, potassium 3.4, chloride 100, bicarb 23, glucose 94, BUN 11, creatinine 0.58, magnesium 1.9.  White count 12.7, hemoglobin 14.2, hematocrit 43.1, platelets 291. SARS COVID-19 negative.  Brain MRI with acute infarction affecting the external capsule and caudate body on the right.  No mass-effect or hemorrhage.  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  EKG 83 bpm, normal axis, right bundle branch block, sinus rhythm, no st segment or T wave changes.   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  Echocardiogram with aortic valve bulky calcific lesion, pending TEE to complete workup.   Assessment & Plan:   Principal Problem:   Acute CVA (cerebrovascular accident) [facility]) Active Problems:   Tobacco abuse   Essential hypertension   Alcohol use   Leukocytosis   CVA (cerebral vascular accident) ([place])   1. Acute right ischemic CVA external capsule  and caudate body. Continue to improve speech, but persistent left sided weakness.   MRA severe stenosis right M1, filling defect, may represent thrombus or atherosclerotic plaque.  Echocardiogram with preserved LV EF but positive severe bulky calcification/ mass on the non coronary cusp and commisure between the right and non cusps.    Continue anticoagulation with aspirin and clopidogrel, for 3 months then continue with clopidogrel alone.  Physical therapy and occupational therapy. Out of bed to chair tid with meals.  Pending TEE to complete workup.   2. HTN. Continue to hold on lisinopril and hctz. Blood pressure today 104/74 mmHg Tolerating well metoprolol.   3. Tobacco and alcohol abuse. No current clinical sings of withdrawal, on neuro checks per unit protocol. Continue with alprazolam PRN for anxiety.  On 100 mg thiamine daily and multivitamins.   4, leukocytosis.  Reactive leukocytosis, wbc is 12,7.   5, Chronic pain syndrome. PRN oxycodone and lower dose of  cyclobenzaprine with good toleration.    Status is: Inpatient  Remains inpatient appropriate because:Inpatient level of care appropriate due to severity of illness   Dispo: The patient is from: Home              Anticipated d/c is to: SNF              Patient currently is not medically stable to d/c.   Difficult to place patient No   DVT prophylaxis: Enoxaparin   Code Status:   full  Family Communication:  No family at the bedside today.      Consultants:   Neurology   Cardiology for TEE    Subjective: Patient is feeling well, but continue to have  left sided weakness, speech has improved, no nausea or vomiting, no dyspnea or chest pain, no dysphagia.   Objective: Vitals:   07/02/20 0647 07/02/20 0741 07/02/20 0800 07/02/20 1128  BP: 98/64 110/67 110/67 104/74  Pulse: 86 80 80 82  Resp: 20     Temp: 98.2 F (36.8 C) 98.1 F (36.7 C)  (!) 97.5 F (36.4 C)  TempSrc: Oral Oral  Oral  SpO2: 94%  94%  98%    Intake/Output Summary (Last 24 hours) at 07/02/2020 1145 Last data filed at 07/02/2020 1124 Gross per 24 hour  Intake 657 ml  Output 550 ml  Net 107 ml   There were no vitals filed for this visit.  Examination:   General: Not in pain or dyspnea. deconditioned  Neurology: Awake and alert, left sided weakness,. Upper extremity   E ENT: no pallor, no icterus, oral mucosa moist Cardiovascular: No JVD. S1-S2 present, rhythmic, no gallops, rubs, or murmurs. No lower extremity edema. Pulmonary:  positive breath sounds bilaterally, adequate air movement, no wheezing, rhonchi or rales. Gastrointestinal. Abdomen soft and non tender Skin. No rashes Musculoskeletal: no joint deformities     Data Reviewed: I have personally reviewed following labs and imaging studies  CBC: Recent Labs  Lab 06/30/20 1905 06/30/20 1938  WBC 12.7*  --   NEUTROABS 10.2*  --   HGB 14.2 13.9  HCT 43.1 41.0  MCV 95.1  --   PLT 291  --    Basic Metabolic Panel: Recent Labs  Lab 06/30/20 1938 06/30/20 2200  NA 136 134*  K 3.9 3.4*  CL 101 100  CO2  --  23  GLUCOSE 93 94  BUN 15 11  CREATININE 0.50* 0.58*  CALCIUM  --  9.0  MG  --  1.9   GFR: CrCl cannot be calculated (Unknown ideal weight.). Liver Function Tests: Recent Labs  Lab 06/30/20 2200  AST 118*  ALT 33  ALKPHOS 79  BILITOT 0.8  PROT 6.3*  ALBUMIN 3.4*   No results for input(s): LIPASE, AMYLASE in the last 168 hours. No results for input(s): AMMONIA in the last 168 hours. Coagulation Profile: No results for input(s): INR, PROTIME in the last 168 hours. Cardiac Enzymes: Recent Labs  Lab 06/30/20 2200  CKTOTAL 6,158*   BNP (last 3 results) No results for input(s): PROBNP in the last 8760 hours. HbA1C: Recent Labs    07/01/20 0152  HGBA1C 6.3*   CBG: No results for input(s): GLUCAP in the last 168 hours. Lipid Profile: Recent Labs    07/01/20 0153  CHOL 176  HDL 48  LDLCALC 115*  TRIG 67   CHOLHDL 3.7   Thyroid Function Tests: No results for input(s): TSH, T4TOTAL, FREET4, T3FREE, THYROIDAB in the last 72 hours. Anemia Panel: No results for input(s): VITAMINB12, FOLATE, FERRITIN, TIBC, IRON, RETICCTPCT in the last 72 hours.    Radiology Studies: I have reviewed all of the imaging during this hospital visit personally     Scheduled Meds: .  stroke: mapping our early stages of recovery book   Does not apply Once  . ALPRAZolam  0.5 mg Oral TID  . aspirin EC  325 mg Oral Daily  . atorvastatin  40 mg Oral Daily  . clopidogrel  75 mg Oral Daily  . cyclobenzaprine  5 mg Oral TID  . enoxaparin (LOVENOX) injection  40 mg Subcutaneous Q24H  . folic acid  1 mg Oral Daily  . metoprolol succinate  25 mg Oral Daily  .  multivitamin  1 tablet Oral Daily  . pantoprazole  40 mg Oral Daily  . polyethylene glycol  17 g Oral Daily  . thiamine  100 mg Oral Daily   Continuous Infusions:   LOS: 1 day        Cody Douglas Gerome Apley, MD

## 2020-07-02 NOTE — NC FL2 (Signed)
[place] LEVEL OF CARE SCREENING TOOL     IDENTIFICATION  Patient Name: Cody Douglas Birthdate: 27-Jun-1950 Sex: male Admission Date (Current Location): 06/30/2020  [facility] and [place] Number:  Herbalist and Address:  [facility], [place] [address] [place], [place] [address]      Provider Number: [id number]  Attending Physician Name and Address:  Tawni Millers  Relative Name and Phone Number:       Current Level of Care: Hospital Recommended Level of Care: [place] Prior Approval Number:    Date Approved/Denied:   PASRR Number: [id number]  Discharge Plan: SNF    Current Diagnoses: Patient Active Problem List   Diagnosis Date Noted  . CVA (cerebral vascular accident) ([place]) 07/01/2020  . Acute CVA (cerebrovascular accident) ([place]) 06/30/2020  . Essential hypertension 06/30/2020  . Alcohol use 06/30/2020  . Leukocytosis 06/30/2020  . Hypertrophic cardiomyopathy ([place]) 12/14/2013  . Abnormal ECG 12/13/2013  . CAP (community acquired pneumonia) 12/13/2013  . Chest pain 12/12/2013  . Hyponatremia 12/12/2013  . Hypokalemia 12/12/2013  . Tobacco abuse 12/12/2013    Orientation RESPIRATION BLADDER Height & Weight     Self,Time,Situation,Place  Normal Continent Weight:   Height:     BEHAVIORAL SYMPTOMS/MOOD NEUROLOGICAL BOWEL NUTRITION STATUS      Continent Diet (heart healthy)  AMBULATORY STATUS COMMUNICATION OF NEEDS Skin   Limited Assist Verbally Normal                       Personal Care Assistance Level of Assistance  Bathing,Feeding,Dressing Bathing Assistance: Limited assistance Feeding assistance: Independent Dressing Assistance: Limited assistance     Functional Limitations Info  Speech     Speech Info: Impaired (dysarthria)    SPECIAL CARE FACTORS FREQUENCY  PT (By licensed PT),OT (By licensed OT),Speech therapy     PT Frequency: 5x/wk OT Frequency: 5x/wk      Speech Therapy Frequency: 5x/wk      Contractures Contractures Info: Not present    Additional Factors Info  Code Status,Allergies,Psychotropic Code Status Info: Full Allergies Info: NKA Psychotropic Info: Xanax .5mg  3x/day PRN         Current Medications (07/02/2020):  This is the current hospital active medication list Current Facility-Administered Medications  Medication Dose Route Frequency Provider Last Rate Last Admin  .  stroke: mapping our early stages of recovery book   Does not apply Once Marcelyn Bruins, MD      . oxyCODONE (Oxy IR/ROXICODONE) immediate release tablet 10 mg  10 mg Oral Q6H PRN Marcelyn Bruins, MD   10 mg at 07/01/20 2224   And  . acetaminophen (TYLENOL) tablet 325 mg  325 mg Oral Q6H PRN Marcelyn Bruins, MD      . acetaminophen (TYLENOL) tablet 650 mg  650 mg Oral Q4H PRN Marcelyn Bruins, MD      . albuterol (VENTOLIN HFA) 108 (90 Base) MCG/ACT inhaler 2 puff  2 puff Inhalation Q6H PRN Marcelyn Bruins, MD      . ALPRAZolam Duanne Moron) tablet 0.5 mg  0.5 mg Oral TID Tawni Millers, MD   0.5 mg at 07/02/20 1024  . aspirin EC tablet 325 mg  325 mg Oral Daily Rosalin Hawking, MD   325 mg at 07/02/20 1024  . atorvastatin (LIPITOR) tablet 40 mg  40 mg Oral Daily Rosalin Hawking, MD   40 mg at 07/02/20 1023  . clopidogrel (PLAVIX) tablet  75 mg  75 mg Oral Daily Rosalin Hawking, MD   75 mg at 07/02/20 1024  . cyclobenzaprine (FLEXERIL) tablet 5 mg  5 mg Oral TID Tawni Millers, MD   5 mg at 07/02/20 1023  . enoxaparin (LOVENOX) injection 40 mg  40 mg Subcutaneous Q24H Marcelyn Bruins, MD   40 mg at 07/02/20 1024  . folic acid (FOLVITE) tablet 1 mg  1 mg Oral Daily Rosalin Hawking, MD   1 mg at 07/02/20 1023  . metoprolol succinate (TOPROL-XL) 24 hr tablet 25 mg  25 mg Oral Daily Arrien, Jimmy Picket, MD   25 mg at 07/02/20 1023  . multivitamin (PROSIGHT) tablet 1 tablet  1 tablet Oral Daily Rosalin Hawking, MD   1 tablet at 07/02/20 1023  .  pantoprazole (PROTONIX) EC tablet 40 mg  40 mg Oral Daily Marcelyn Bruins, MD   40 mg at 07/02/20 1024  . polyethylene glycol (MIRALAX / GLYCOLAX) packet 17 g  17 g Oral Daily Arrien, Jimmy Picket, MD   17 g at 07/02/20 1037  . senna-docusate (Senokot-S) tablet 1 tablet  1 tablet Oral QHS PRN Marcelyn Bruins, MD   1 tablet at 07/01/20 2225  . thiamine tablet 100 mg  100 mg Oral Daily Rosalin Hawking, MD   100 mg at 07/02/20 1024     Discharge Medications: Please see discharge summary for a list of discharge medications.  Relevant Imaging Results:  Relevant Lab Results:   Additional Information SS#: [id number]  Geralynn Ochs, LCSW

## 2020-07-02 NOTE — Progress Notes (Signed)
    [facility] has been requested to perform a transesophageal echocardiogram on Cody Douglas for aortic valve vegetation.  After careful review of history and examination, the risks and benefits of transesophageal echocardiogram have been explained including risks of esophageal damage, perforation (1:10,000 risk), bleeding, pharyngeal hematoma as well as other potential complications associated with conscious sedation including aspiration, arrhythmia, respiratory failure and death. Alternatives to treatment were discussed, questions were answered. Patient is willing to proceed.   TEE scheduled Monday 07/05/20 at 0930 with Dr. Radford Pax. NPO at [facility] on Sunday night please.  Ledora Bottcher, [place]  07/02/2020 12:58 PM

## 2020-07-03 DIAGNOSIS — Z720 Tobacco use: Secondary | ICD-10-CM | POA: Diagnosis not present

## 2020-07-03 DIAGNOSIS — Z7289 Other problems related to lifestyle: Secondary | ICD-10-CM | POA: Diagnosis not present

## 2020-07-03 DIAGNOSIS — I10 Essential (primary) hypertension: Secondary | ICD-10-CM | POA: Diagnosis not present

## 2020-07-03 DIAGNOSIS — D72829 Elevated white blood cell count, unspecified: Secondary | ICD-10-CM | POA: Diagnosis not present

## 2020-07-03 NOTE — Progress Notes (Signed)
Physical Therapy Treatment Patient Details Name: Cody Douglas MRN: [id number] DOB: 30-Apr-1950 Today's Date: 07/03/2020    History of Present Illness Pt is a 70 y/o male presenting to the ED with slurred speech and facial droop. 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 PMH includes hypertrophic cardiomyopathy, hyponatremia, hypokalemia, chest pain, tobacco use, ETOH abuse,hypertension, prostate cancer.    PT Comments    Patient continues to require assist for OOB mobility and constant cueing and assist for management of RW. Patient unable to verbalize need for bathroom until actively going during ambulation. Patient stated "I told you" but did not verbalize. Patient with L lateral lean in standing during peri care. Continue to recommend SNF for ongoing Physical Therapy.       Follow Up Recommendations  SNF;Supervision/Assistance - 24 hour     Equipment Recommendations  Rolling Daemon Dowty with 5" wheels;3in1 (PT)    Recommendations for Other Services       Precautions / Restrictions Precautions Precautions: Fall Precaution Comments: Reports hx of falls Restrictions Weight Bearing Restrictions: No    Mobility  Bed Mobility Overal bed mobility: Needs Assistance Bed Mobility: Supine to Sit;Sit to Supine     Supine to sit: Mod assist Sit to supine: Mod assist   General bed mobility comments: Mod A for LE assist to return to supine.    Transfers Overall transfer level: Needs assistance Equipment used: Rolling Teion Ballin (2 wheeled) Transfers: Sit to/from Stand Sit to Stand: Mod assist         General transfer comment: modA for boost up into standing. Cues for hand placement  Ambulation/Gait Ambulation/Gait assistance: Min assist Gait Distance (Feet): 15 Feet (5) Assistive device: Rolling Cruze Zingaro (2 wheeled) Gait Pattern/deviations: Step-through pattern;Decreased stride length;Staggering left;Staggering  right;Drifts right/left;Trunk flexed Gait velocity: decreased   General Gait Details: Poor awareness of use of RW. Constant cues and assist to maintain proximity with RW. Patient with BM during ambulation but did not verbalize need for restroom until asked and actively going. Brought BSC up to patient with assistance from daughter.   Stairs             Wheelchair Mobility    Modified Rankin (Stroke Patients Only) Modified Rankin (Stroke Patients Only) Pre-Morbid Rankin Score: No significant disability Modified Rankin: Moderately severe disability     Balance Overall balance assessment: Needs assistance Sitting-balance support: No upper extremity supported;Feet supported Sitting balance-Leahy Scale: Fair     Standing balance support: Bilateral upper extremity supported;During functional activity Standing balance-Leahy Scale: Poor Standing balance comment: Reliant on BUE support                            Cognition Arousal/Alertness: Awake/alert Behavior During Therapy: Flat affect Overall Cognitive Status: Impaired/Different from baseline Area of Impairment: Attention;Memory;Awareness;Safety/judgement;Problem solving                   Current Attention Level: Selective Memory: Decreased recall of precautions   Safety/Judgement: Decreased awareness of safety;Decreased awareness of deficits Awareness: Emergent Problem Solving: Slow processing General Comments: Decreased safety awareness and difficulty verbalizing needs. Patient with bowel movement during ambulation but only alerting PT when asked. Patient states while on [facility] "I told you I had to go" Unsure if patient is thinking about saying something but not verbalizing      Exercises      General Comments  Pertinent Vitals/Pain Pain Assessment: Faces Faces Pain Scale: Hurts little more Pain Location: back Pain Descriptors / Indicators: Aching Pain Intervention(s): Limited activity  within patient's tolerance;Monitored during session    Home Living                      Prior Function            PT Goals (current goals can now be found in the care plan section) Acute Rehab PT Goals Patient Stated Goal: none stated PT Goal Formulation: With patient Time For Goal Achievement: 07/15/20 Potential to Achieve Goals: Fair Progress towards PT goals: Progressing toward goals    Frequency    Min 3X/week      PT Plan Current plan remains appropriate    Co-evaluation              AM-PAC PT "6 Clicks" Mobility   Outcome Measure  Help needed turning from your back to your side while in a flat bed without using bedrails?: A Lot Help needed moving from lying on your back to sitting on the side of a flat bed without using bedrails?: A Lot Help needed moving to and from a bed to a chair (including a wheelchair)?: A Lot Help needed standing up from a chair using your arms (e.g., wheelchair or bedside chair)?: A Lot Help needed to walk in hospital room?: A Little Help needed climbing 3-5 steps with a railing? : A Lot 6 Click Score: 13    End of Session Equipment Utilized During Treatment: Gait belt Activity Tolerance: Patient tolerated treatment well Patient left: in bed;with call bell/phone within reach;with bed alarm set;with family/visitor present Nurse Communication: Mobility status PT Visit Diagnosis: Unsteadiness on feet (R26.81);Muscle weakness (generalized) (M62.81);History of falling (Z91.81)     Time: 7408-1448 PT Time Calculation (min) (ACUTE ONLY): 28 min  Charges:  $Gait Training: 8-22 mins $Therapeutic Activity: 8-22 mins                     Payne Garske A. Gilford Rile PT, DPT Acute Rehabilitation Services Pager [phone] Office [phone]    Linna Hoff 07/03/2020, 5:02 PM

## 2020-07-03 NOTE — Plan of Care (Signed)
  Problem: Clinical Measurements: Goal: Respiratory complications will improve Outcome: Progressing   Problem: Clinical Measurements: Goal: Diagnostic test results will improve Outcome: Progressing   Problem: Clinical Measurements: Goal: Will remain free from infection Outcome: Progressing   Problem: Clinical Measurements: Goal: Ability to maintain clinical measurements within normal limits will improve Outcome: Progressing

## 2020-07-03 NOTE — Progress Notes (Signed)
PROGRESS NOTE    [name]  QIH:[id number] DOB: Jan 26, 1951 DOA: 06/30/2020 PCP: Tamsen Roers, MD    Brief Narrative:  [name] was admitted to the hospital withtheworking diagnosis of acute ischemic infarct, rightexternal capsuleand caudate body.  70 year old male past medical history for hypertrophic cardiomyopathy, hyponatremia, hypokalemia, alcohol andtobacco abuse, hypertension and prostate cancer who presented with slurred speech and facial droop. Family found patient with slight left facial droop and slurred speech apparently acute onset of symptoms. EMS was called and patient was brought to the hospital. Patient with ongoing alcohol abuse. On his initial physical examination blood pressure 133/76, heart rate 106, respiratory rate 22, temperature 95, oxygen saturation 93%, lungs are clear to auscultation bilaterally, heart S1-S2, present rhythmic, soft abdomen, no lower extremity edema. Positive left facial droop and slurred speech, left upper extremity 4-5/5 strength.  Sodium 134, potassium 3.4, chloride 100, bicarb 23, glucose 94, BUN 11, creatinine 0.58, magnesium 1.9.White count 12.7, hemoglobin 14.2, hematocrit 43.1, platelets 291. SARS COVID-19 negative.  Brain MRI with acute infarction affecting the external capsule and caudatebody on the right. No mass-effect or hemorrhage. 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  EKG 83 bpm, normal axis, right bundle branch block, sinus rhythm, no st segment or T wave changes.  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  Echocardiogram with aortic valve bulky calcific lesion, pending TEE to complete workup   Assessment & Plan:   Principal Problem:   Acute CVA (cerebrovascular accident) ([place]) Active Problems:   Tobacco abuse   Essential hypertension   Alcohol use   Leukocytosis   CVA (cerebral vascular accident) ([place])   1. Acute right ischemic CVA external  capsule and caudate body. slowly improving but not yet back to baseline.   MRA severe stenosis right M1, filling defect, may represent thrombus or atherosclerotic plaque.  Echocardiogram with preserved LV EF but positive severe bulky calcification/ mass on the non coronary cusp and commisure between the right and non cusps.   Plan to continue anticoagulation with aspirin and clopidogrel, for 3 months then continue with clopidogrel alone.   Pending TEE to complete workup.   Patient agrees with SNF, for short term rehab.   2. HTN. Blood pressure has been stable with metoprolol.   3. Tobacco and alcohol abuse. No frank withdrawal symptoms.  Continue with alprazolam PRN for anxiety.  Continue with 100 mg thiamine daily and multivitamins.   4, leukocytosis.  cell count stable   5, Chronic pain syndrome. continue with PRN oxycodone and lower dose of  cyclobenzaprine with good toleration.    Status is: Inpatient  Remains inpatient appropriate because:Unsafe d/c plan   Dispo: The patient is from: Home              Anticipated d/c is to: SNF              Patient currently is not medically stable to d/c.   Difficult to place patient No   DVT prophylaxis: Enoxaparin   Code Status:   full  Family Communication:  No family at the bedside      Consultants:   Neurology   Cardiology TEE   Subjective: Patient is feeling better, but not back to his baseline, continue to have left sided weakness  Objective: Vitals:   07/02/20 2050 07/02/20 2316 07/03/20 0329 07/03/20 0758  BP: 107/61 (!) 102/57 109/65 114/64  Pulse: 79 80 82 76  Resp: 18 16 17 18   Temp: 97.7  F (36.5 C) 98.7 F (37.1 C) 97.6 F (36.4 C) 97.6 F (36.4 C)  TempSrc: Axillary Oral Oral Oral  SpO2: 94% 93% 94% 93%    Intake/Output Summary (Last 24 hours) at 07/03/2020 1039 Last data filed at 07/03/2020 1000 Gross per 24 hour  Intake 358 ml  Output 520 ml  Net -162 ml   There were no vitals filed  for this visit.  Examination:   General: Not in pain or dyspnea, deconditioned  Neurology: Awake and alert, decreased strength at the left side  E ENT: no pallor, no icterus, oral mucosa moist Cardiovascular: No JVD. S1-S2 present, rhythmic, no gallops, rubs, or murmurs. No lower extremity edema. Pulmonary: positive breath sounds bilaterally, adequate air movement, no wheezing, rhonchi or rales. Gastrointestinal. Abdomen soft and non tender Skin. No rashes Musculoskeletal: no joint deformities     Data Reviewed: I have personally reviewed following labs and imaging studies  CBC: Recent Labs  Lab 06/30/20 1905 06/30/20 1938  WBC 12.7*  --   NEUTROABS 10.2*  --   HGB 14.2 13.9  HCT 43.1 41.0  MCV 95.1  --   PLT 291  --    Basic Metabolic Panel: Recent Labs  Lab 06/30/20 1938 06/30/20 2200  NA 136 134*  K 3.9 3.4*  CL 101 100  CO2  --  23  GLUCOSE 93 94  BUN 15 11  CREATININE 0.50* 0.58*  CALCIUM  --  9.0  MG  --  1.9   GFR: CrCl cannot be calculated (Unknown ideal weight.). Liver Function Tests: Recent Labs  Lab 06/30/20 2200  AST 118*  ALT 33  ALKPHOS 79  BILITOT 0.8  PROT 6.3*  ALBUMIN 3.4*   No results for input(s): LIPASE, AMYLASE in the last 168 hours. No results for input(s): AMMONIA in the last 168 hours. Coagulation Profile: No results for input(s): INR, PROTIME in the last 168 hours. Cardiac Enzymes: Recent Labs  Lab 06/30/20 2200  CKTOTAL 6,158*   BNP (last 3 results) No results for input(s): PROBNP in the last 8760 hours. HbA1C: Recent Labs    07/01/20 0152  HGBA1C 6.3*   CBG: No results for input(s): GLUCAP in the last 168 hours. Lipid Profile: Recent Labs    07/01/20 0153  CHOL 176  HDL 48  LDLCALC 115*  TRIG 67  CHOLHDL 3.7   Thyroid Function Tests: No results for input(s): TSH, T4TOTAL, FREET4, T3FREE, THYROIDAB in the last 72 hours. Anemia Panel: No results for input(s): VITAMINB12, FOLATE, FERRITIN, TIBC, IRON,  RETICCTPCT in the last 72 hours.    Radiology Studies: I have reviewed all of the imaging during this hospital visit personally     Scheduled Meds: .  stroke: mapping our early stages of recovery book   Does not apply Once  . ALPRAZolam  0.5 mg Oral TID  . aspirin EC  325 mg Oral Daily  . atorvastatin  40 mg Oral Daily  . clopidogrel  75 mg Oral Daily  . cyclobenzaprine  5 mg Oral TID  . enoxaparin (LOVENOX) injection  40 mg Subcutaneous Q24H  . folic acid  1 mg Oral Daily  . metoprolol succinate  25 mg Oral Daily  . multivitamin  1 tablet Oral Daily  . pantoprazole  40 mg Oral Daily  . polyethylene glycol  17 g Oral Daily  . thiamine  100 mg Oral Daily   Continuous Infusions:   LOS: 2 days        Jimmy Picket  Eleftherios Dudenhoeffer, MD

## 2020-07-04 ENCOUNTER — Inpatient Hospital Stay (HOSPITAL_COMMUNITY): Payer: Medicare Other | Admitting: Anesthesiology

## 2020-07-04 ENCOUNTER — Inpatient Hospital Stay (HOSPITAL_COMMUNITY): Payer: Medicare Other

## 2020-07-04 DIAGNOSIS — D72829 Elevated white blood cell count, unspecified: Secondary | ICD-10-CM | POA: Diagnosis not present

## 2020-07-04 DIAGNOSIS — Z720 Tobacco use: Secondary | ICD-10-CM | POA: Diagnosis not present

## 2020-07-04 DIAGNOSIS — Z7289 Other problems related to lifestyle: Secondary | ICD-10-CM | POA: Diagnosis not present

## 2020-07-04 IMAGING — DX DG CHEST 1V
1 series · 1 of 1 positions shown · non-contrast
Comparison: [DATE]

CLINICAL DATA: Shortness of breath.

EXAM:
CHEST  1 VIEW

[chest ap]
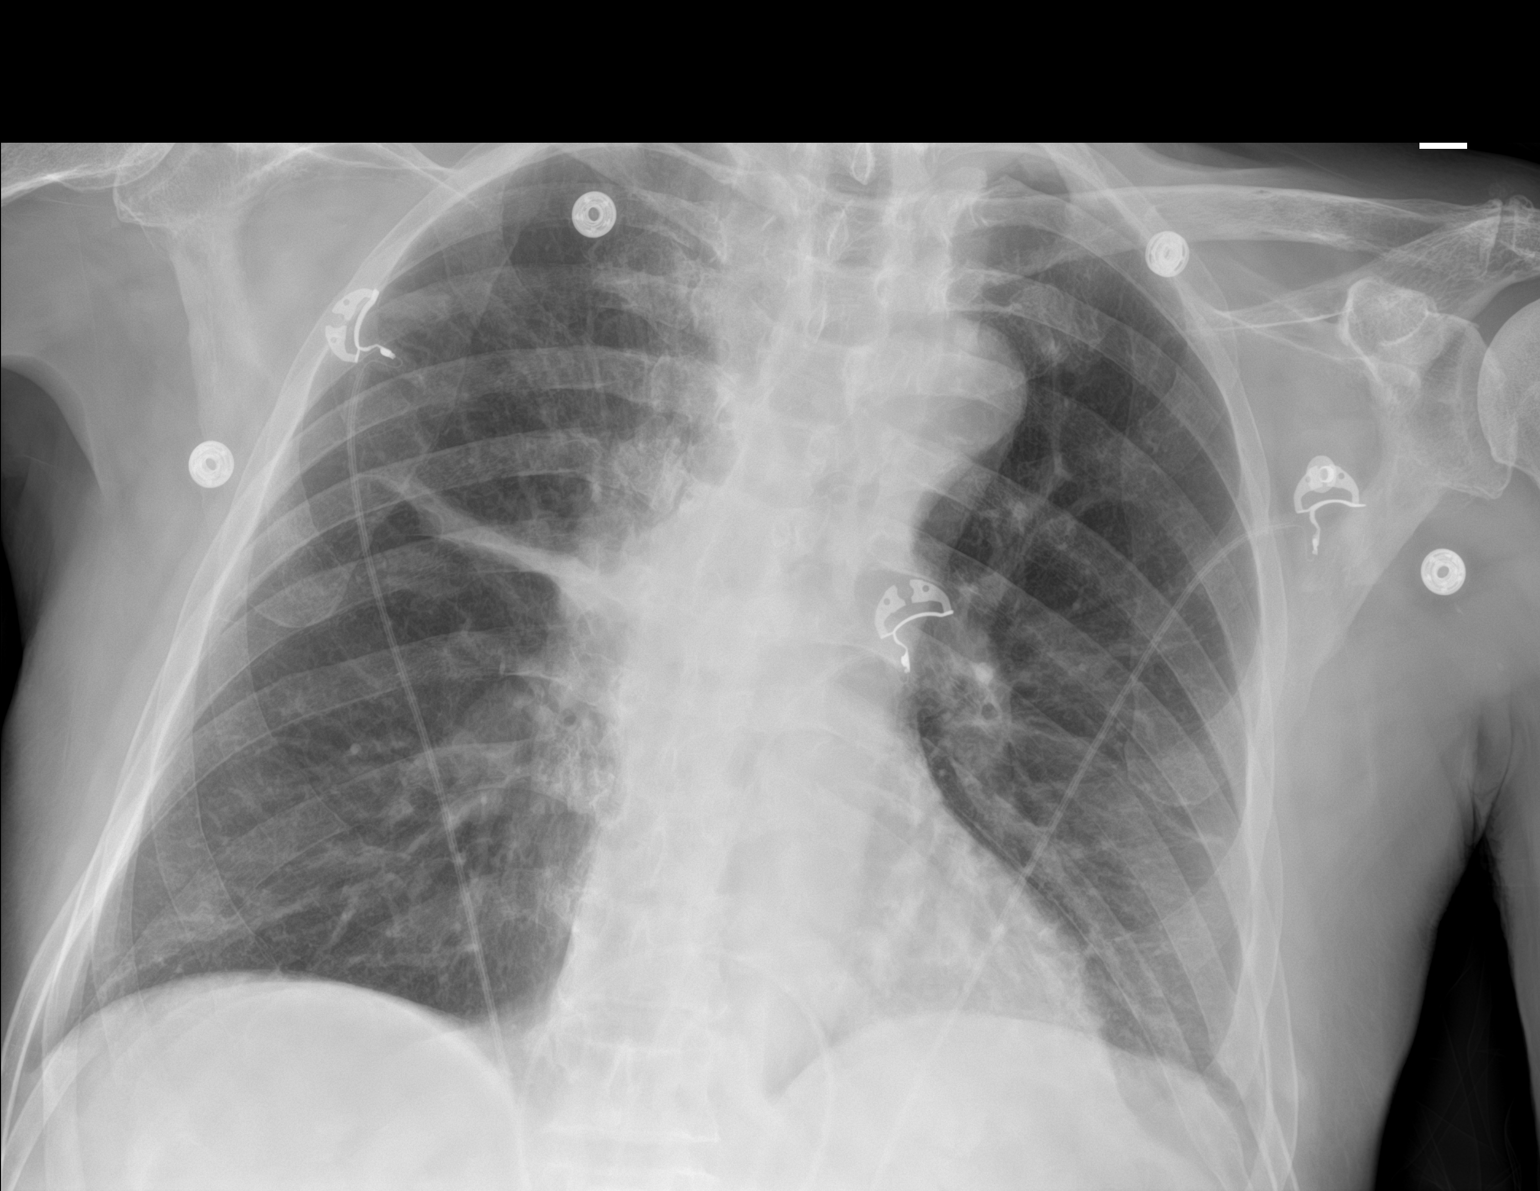

[1 of 1 positions shown; findings below may reference images not displayed]

FINDINGS: Platelike opacity extending from the right hilum peripherally,
similar since [DATE] but new compared to [QD]. Mild fullness
in the right hilar region. Left lung is clear. No pneumothorax. No
other acute abnormalities.
IMPRESSION: Platelike opacity extending from the right hilum peripherally with
some fullness in the right hilar region. Recommend CT imaging for
better evaluation.

These results will be called to the ordering clinician or
representative by the Radiologist Assistant, and communication
documented in the PACS or [REDACTED].

## 2020-07-04 MED ORDER — GUAIFENESIN-DM 100-10 MG/5ML PO SYRP
5.0000 mL | ORAL_SOLUTION | ORAL | Status: DC | PRN
  Administered 2020-07-04 – 2020-07-15 (×2): 5 mL via ORAL
  Filled 2020-07-04 (×2): qty 5

## 2020-07-04 NOTE — TOC Progression Note (Addendum)
Transition of Care [facility]) - Progression Note    Patient Details  Name: HESTON WIDENER MRN: [id number] Date of Birth: 1950-09-16  Transition of Care [facility]) CM/SW [place], [place] Phone Number: 07/04/2020, 9:46 AM  Clinical Narrative:     Covering CSW attempted to discuss bed choice with pt yesterday and noted that he was disoriented, and not able to answer appropriately. Per chart today, pt continues to be disoriented. CSW contacted Doren Custard, Who is a Brother in [place], he provided the number to [place], who is the pt's brother.  Merry Proud noted that either himself or pt's dtr would visit today to assist pt in decision making. CSW will follow up with family when they arrive at bedside.  CSW met at bedside with pt and dtr Hinton Dyer. Hinton Dyer noted their choice for SNF is [facility]. She stated and pt agreed that she would be the main contact person at this time, CSW will update emergency contacts. Pt has had covid vaccines and booster. CSW will hold off on  insurance and requesting covid at this time as MD will likely DC Tuesday. SW will continue to follow for DC planning.   Expected Discharge Plan: [place] Barriers to Discharge: Insurance Authorization,Continued Medical Work up,Active Substance Use - Placement  Expected Discharge Plan and Services Expected Discharge Plan: [place] Choice: [place] Living arrangements for the past 2 months: Single Family Home                                       Social Determinants of Health (SDOH) Interventions    Readmission Risk Interventions No flowsheet data found.

## 2020-07-04 NOTE — Progress Notes (Signed)
PROGRESS NOTE    Cody Douglas  FYB:[id number] DOB: 1950-04-23 DOA: 06/30/2020 PCP: Tamsen Roers, MD    Brief Narrative:  [name] was admitted to the hospital withtheworking diagnosis of acute ischemic infarct, rightexternal capsuleand caudate body. Hospitalization complicated with hemoptysis.   70 year old male past medical history for hypertrophic cardiomyopathy, hyponatremia, hypokalemia, alcohol andtobacco abuse, hypertension and prostate cancer who presented with slurred speech and facial droop. Family found patient with slight left facial droop and slurred speech apparently acute onset of symptoms. EMS was called and patient was brought to the hospital. Patient with ongoing alcohol abuse. On his initial physical examination blood pressure 133/76, heart rate 106, respiratory rate 22, temperature 95, oxygen saturation 93%, lungs are clear to auscultation bilaterally, heart S1-S2, present rhythmic, soft abdomen, no lower extremity edema. Positive left facial droop and slurred speech, left upper extremity 4-5/5 strength.  Sodium 134, potassium 3.4, chloride 100, bicarb 23, glucose 94, BUN 11, creatinine 0.58, magnesium 1.9.White count 12.7, hemoglobin 14.2, hematocrit 43.1, platelets 291. SARS COVID-19 negative.  Brain MRI with acute infarction affecting the external capsule and caudatebody on the right. No mass-effect or hemorrhage. 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  EKG 83 bpm, normal axis, right bundle branch block, sinus rhythm, no st segment or T wave changes.  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  Echocardiogram with aortic valve bulky calcific lesion, pending TEE to complete workup.  Patient very weak and deconditioned, plan to transfer to SNF to continue physical therapy.   Today patient with hemoptysis., moderate in intensity.   Assessment & Plan:   Principal Problem:   Acute CVA  (cerebrovascular accident) [facility]) Active Problems:   Tobacco abuse   Essential hypertension   Alcohol use   Leukocytosis   CVA (cerebral vascular accident) ([place])    1. 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 MRA severe stenosis right M1, filling defect, may represent thrombus or atherosclerotic plaque.  Echocardiogram with preserved LV EF but positive severe bulky calcification/ mass on the non coronary cusp and commisure between the right and non cusps.   Today with worsening swallow dysfunction.   Because hemoptysis, will hold on aspirin and clopidogrel for now. Continue close neuro checks. Re-consult speech therapy, for swallow dysfunction, will keep him NPO for for now.   Plan for TEE in am, to completer workup.  Transfer to SNF when medically stable.   2. HTN.Continue blood pressure with metoprolol.   3. Tobacco and alcohol abuse. No frank withdrawal symptoms.  On as needed alprazolam PRN for anxiety.  On 100 mg thiamine daily and multivitamins.  4, leukocytosis.follow cell count in am.   5, Chronic pain syndrome.On PRNoxycodone and lower dose ofcyclobenzaprine with good toleration.  6. New hemoptysis. Frank blood, moderate in amount, positive rhonchi on left upper lobe.  Plan to hold on aspirin and clopidogrel, and check chest film,. Continue close oxymetry monitoring.   Patient continue to be at high risk for worsening neuro deficit and hemoptysis.   Status is: Inpatient  Remains inpatient appropriate because:IV treatments appropriate due to intensity of illness or inability to take PO   Dispo: The patient is from: Home              Anticipated d/c is to: SNF              Patient currently is not medically stable to d/c.   Difficult to place patient No   DVT prophylaxis:  scd (hold on enoxaparin due to hemoptysis)   Code Status:   Full  Family Communication:  I spoke with patient's daughter at the bedside, we talked in  detail about patient's condition, plan of care and prognosis and all questions were addressed.     Consultants:   Neurology    Cardiology for TEE   Subjective: Patient this am with worsening cough and hemoptysis, moderate in amount, associated with dyspnea.   Objective: Vitals:   07/03/20 2038 07/03/20 2353 07/04/20 0428 07/04/20 0720  BP: (!) 102/56 116/63 109/61 130/73  Pulse: 84 76 74 75  Resp:  16    Temp: 97.9 F (36.6 C) 98.6 F (37 C)  97.6 F (36.4 C)  TempSrc: Oral Oral  Oral  SpO2: 94% 92% 94% 93%    Intake/Output Summary (Last 24 hours) at 07/04/2020 1112 Last data filed at 07/04/2020 0800 Gross per 24 hour  Intake 100 ml  Output 400 ml  Net -300 ml   There were no vitals filed for this visit.  Examination:   General: deconditioned and ill looking appearing,.  Neurology: Awake and alert,  E ENT: no pallor, no icterus, oral mucosa moist positive oral blood bright red. Cardiovascular: No JVD. S1-S2 present, rhythmic, no gallops, rubs, or murmurs. No lower extremity edema. Pulmonary: positive breath sounds bilaterally, with no wheezing, positive rales and rhonchi at the right upper lobe.  Gastrointestinal. Abdomen soft and non tender Skin. No rashes Musculoskeletal: no joint deformities     Data Reviewed: I have personally reviewed following labs and imaging studies  CBC: Recent Labs  Lab 06/30/20 1905 06/30/20 1938  WBC 12.7*  --   NEUTROABS 10.2*  --   HGB 14.2 13.9  HCT 43.1 41.0  MCV 95.1  --   PLT 291  --    Basic Metabolic Panel: Recent Labs  Lab 06/30/20 1938 06/30/20 2200  NA 136 134*  K 3.9 3.4*  CL 101 100  CO2  --  23  GLUCOSE 93 94  BUN 15 11  CREATININE 0.50* 0.58*  CALCIUM  --  9.0  MG  --  1.9   GFR: CrCl cannot be calculated (Unknown ideal weight.). Liver Function Tests: Recent Labs  Lab 06/30/20 2200  AST 118*  ALT 33  ALKPHOS 79  BILITOT 0.8  PROT 6.3*  ALBUMIN 3.4*   No results for input(s): LIPASE,  AMYLASE in the last 168 hours. No results for input(s): AMMONIA in the last 168 hours. Coagulation Profile: No results for input(s): INR, PROTIME in the last 168 hours. Cardiac Enzymes: Recent Labs  Lab 06/30/20 2200  CKTOTAL 6,158*   BNP (last 3 results) No results for input(s): PROBNP in the last 8760 hours. HbA1C: No results for input(s): HGBA1C in the last 72 hours. CBG: No results for input(s): GLUCAP in the last 168 hours. Lipid Profile: No results for input(s): CHOL, HDL, LDLCALC, TRIG, CHOLHDL, LDLDIRECT in the last 72 hours. Thyroid Function Tests: No results for input(s): TSH, T4TOTAL, FREET4, T3FREE, THYROIDAB in the last 72 hours. Anemia Panel: No results for input(s): VITAMINB12, FOLATE, FERRITIN, TIBC, IRON, RETICCTPCT in the last 72 hours.    Radiology Studies: I have reviewed all of the imaging during this hospital visit personally     Scheduled Meds: .  stroke: mapping our early stages of recovery book   Does not apply Once  . ALPRAZolam  0.5 mg Oral TID  . aspirin EC  325 mg Oral Daily  . atorvastatin  40 mg Oral Daily  . clopidogrel  75 mg Oral Daily  . cyclobenzaprine  5 mg Oral TID  . enoxaparin (LOVENOX) injection  40 mg Subcutaneous Q24H  . folic acid  1 mg Oral Daily  . metoprolol succinate  25 mg Oral Daily  . multivitamin  1 tablet Oral Daily  . pantoprazole  40 mg Oral Daily  . polyethylene glycol  17 g Oral Daily  . thiamine  100 mg Oral Daily   Continuous Infusions:   LOS: 3 days        Cody Robling Gerome Apley, MD

## 2020-07-04 NOTE — Anesthesia Preprocedure Evaluation (Deleted)
Anesthesia Evaluation  Patient identified by MRN, date of birth, ID band Patient awake    Reviewed: Allergy & Precautions, NPO status , Patient's Chart, lab work & pertinent test results, reviewed documented beta blocker date and time   History of Anesthesia Complications Negative for: history of anesthetic complications  Airway Mallampati: II  TM Distance: >3 FB Neck ROM: Full    Dental  (+) Edentulous Upper, Edentulous Lower   Pulmonary Current Smoker and Patient abstained from smoking.,  New hemoptysis 05/06/20, small amount of frank blood with cough approximately once per hour per patient   Pulmonary exam normal        Cardiovascular hypertension, Pt. on home beta blockers and Pt. on medications Normal cardiovascular exam  HOCM  TTE 07/01/20: EF 55-60%, grade I DD,MV with severe calcified bulky lesions on posterior and anterior leaflets, AV with severe bulky calcification/mass particularly on the non coronary cusp and commisure between the right and non cusps present   Neuro/Psych CVA negative psych ROS   GI/Hepatic negative GI ROS, (+)     substance abuse  alcohol use,   Endo/Other  negative endocrine ROS  Renal/GU negative Renal ROS  negative genitourinary   Musculoskeletal negative musculoskeletal ROS (+)   Abdominal   Peds  Hematology negative hematology ROS (+)   Anesthesia Other Findings Day of surgery medications reviewed with patient.  Reproductive/Obstetrics negative OB ROS                            Anesthesia Physical Anesthesia Plan  ASA: IV  Anesthesia Plan: MAC   Post-op Pain Management:    Induction:   PONV Risk Score and Plan: Treatment may vary due to age or medical condition and Propofol infusion  Airway Management Planned: Natural Airway and Nasal Cannula  Additional Equipment: None  Intra-op Plan:   Post-operative Plan:   Informed Consent: I have  reviewed the patients History and Physical, chart, labs and discussed the procedure including the risks, benefits and alternatives for the proposed anesthesia with the patient or authorized representative who has indicated his/her understanding and acceptance.     Dental advisory given  Plan Discussed with: CRNA  Anesthesia Plan Comments: (Cancelled by proceduralist in preop due to hemoptysis. Daiva Huge, MD)      Anesthesia Quick Evaluation

## 2020-07-04 NOTE — Evaluation (Signed)
Clinical/Bedside Swallow Evaluation Patient Details  Name: Cody Douglas MRN: [id number] Date of Birth: December 31, 1950  Today's Date: 07/04/2020 Time: SLP Start Time (ACUTE ONLY): 1450 SLP Stop Time (ACUTE ONLY): 1510 SLP Time Calculation (min) (ACUTE ONLY): 20 min  Past Medical History:  Past Medical History:  Diagnosis Date  . Hypertension   . Prostate cancer [facility])    Past Surgical History:  Past Surgical History:  Procedure Laterality Date  . EYE SURGERY    . PROSTATE BIOPSY     Prostate surgery 8-9 yrs ago   HPI:  Pt is a 70 y/o male presenting to the ED with slurred speech and facial droop. 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 PMH includes hypertrophic cardiomyopathy, hyponatremia, hypokalemia, chest pain, tobacco use, ETOH abuse,hypertension, prostate cancer. Patient observed to be coughing up bright red blood and also concern for coughing during PO intake of fluids.   Assessment / Plan / Recommendation Clinical Impression  Patient presents with questionable pharyngeal phase dysphagia with coughing and throat clearing during PO intake of thin liquids (water). Difficult to determine exact cause of cough response and although suspect likely due to pharyngeal secretions and blood (coming from unknown source but MD suspecting upper airway), cannot r/o penetration or aspiration of liquid PO's. SLP is recommending to downgrade patient's diet to clear liquids and diet upgrade will be pending determination of cause of his persistent coughing up of bright red blood. SLP Visit Diagnosis: Dysphagia, unspecified (R13.10)    Aspiration Risk  Mild aspiration risk    Diet Recommendation Thin liquid   Liquid Administration via: Cup;Straw Medication Administration: Whole meds with liquid Supervision: Patient able to self feed;Intermittent supervision to cue for compensatory strategies Compensations: Slow rate;Small  sips/bites Postural Changes: Seated upright at 90 degrees    Other  Recommendations Recommended Consults: Consider GI evaluation Oral Care Recommendations: Oral care QID   Follow up Recommendations Other (comment) (TBD)      Frequency and Duration min 1 x/week  1 week       Prognosis Prognosis for Safe Diet Advancement: Good      Swallow Study   General Date of Onset: 07/04/20 HPI: Pt is a 70 y/o male presenting to the ED with slurred speech and facial droop. 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 PMH includes hypertrophic cardiomyopathy, hyponatremia, hypokalemia, chest pain, tobacco use, ETOH abuse,hypertension, prostate cancer. Patient observed to be coughing up bright red blood and also concern for coughing during PO intake of fluids. Type of Study: Bedside Swallow Evaluation Previous Swallow Assessment: none Diet Prior to this Study: Regular;Thin liquids Temperature Spikes Noted: No Respiratory Status: Room air History of Recent Intubation: No Behavior/Cognition: Alert;Cooperative;Pleasant mood Oral Cavity Assessment: Dried secretions Oral Care Completed by SLP: Yes Oral Cavity - Dentition: Adequate natural dentition Vision: Functional for self-feeding Self-Feeding Abilities: Able to feed self Patient Positioning: Upright in bed Baseline Vocal Quality: Normal Volitional Cough: Weak Volitional Swallow: Able to elicit    Oral/Motor/Sensory Function Overall Oral Motor/Sensory Function: Within functional limits   Ice Chips     Thin Liquid Thin Liquid: Impaired Presentation: Straw Pharyngeal  Phase Impairments: Cough - Immediate;Throat Clearing - Immediate Other Comments: difficult to differentiate, but cough more likely related to need to expectorate blood as opposed to from penetration/aspiration of thin liquids (water)    Nectar Thick     Honey Thick     Puree  Puree: Not tested   Solid      Solid: Not tested      Sonia Baller, MA, CCC-SLP 07/04/20 6:05 PM

## 2020-07-05 ENCOUNTER — Inpatient Hospital Stay (HOSPITAL_COMMUNITY): Payer: Medicare Other

## 2020-07-05 ENCOUNTER — Encounter (HOSPITAL_COMMUNITY)
Admission: EM | Disposition: A | Discharge status: Skilled Nursing Facility | Payer: Self-pay | Source: Home / Self Care | Attending: Internal Medicine

## 2020-07-05 ENCOUNTER — Encounter (HOSPITAL_COMMUNITY): Payer: Self-pay | Admitting: Internal Medicine

## 2020-07-05 DIAGNOSIS — Z720 Tobacco use: Secondary | ICD-10-CM | POA: Diagnosis not present

## 2020-07-05 DIAGNOSIS — I10 Essential (primary) hypertension: Secondary | ICD-10-CM | POA: Diagnosis not present

## 2020-07-05 DIAGNOSIS — D72829 Elevated white blood cell count, unspecified: Secondary | ICD-10-CM | POA: Diagnosis not present

## 2020-07-05 LAB — CBC WITH DIFFERENTIAL/PLATELET
Abs Immature Granulocytes: 0.03 10*3/uL (ref 0.00–0.07)
Basophils Absolute: 0 10*3/uL (ref 0.0–0.1)
Basophils Relative: 0 %
Eosinophils Absolute: 0.1 10*3/uL (ref 0.0–0.5)
Eosinophils Relative: 1 %
HCT: 40.4 % (ref 39.0–52.0)
Hemoglobin: 13.2 g/dL (ref 13.0–17.0)
Immature Granulocytes: 0 %
Lymphocytes Relative: 19 %
Lymphs Abs: 1.9 10*3/uL (ref 0.7–4.0)
MCH: 31.1 pg (ref 26.0–34.0)
MCHC: 32.7 g/dL (ref 30.0–36.0)
MCV: 95.3 fL (ref 80.0–100.0)
Monocytes Absolute: 0.9 10*3/uL (ref 0.1–1.0)
Monocytes Relative: 9 %
Neutro Abs: 6.9 10*3/uL (ref 1.7–7.7)
Neutrophils Relative %: 71 %
Platelets: 322 10*3/uL (ref 150–400)
RBC: 4.24 MIL/uL (ref 4.22–5.81)
RDW: 12.4 % (ref 11.5–15.5)
WBC: 9.9 10*3/uL (ref 4.0–10.5)
nRBC: 0 % (ref 0.0–0.2)

## 2020-07-05 LAB — BASIC METABOLIC PANEL
Anion gap: 7 (ref 5–15)
BUN: 14 mg/dL (ref 8–23)
CO2: 24 mmol/L (ref 22–32)
Calcium: 9.1 mg/dL (ref 8.9–10.3)
Chloride: 106 mmol/L (ref 98–111)
Creatinine, Ser: 0.56 mg/dL — ABNORMAL LOW (ref 0.61–1.24)
GFR, Estimated: >60 mL/min (ref >60)
Glucose, Bld: 115 mg/dL — ABNORMAL HIGH (ref 70–99)
Potassium: 3.8 mmol/L (ref 3.5–5.1)
Sodium: 137 mmol/L (ref 135–145)

## 2020-07-05 IMAGING — CT CT CHEST W/O CM
2 of 4 series · 15 of 36 positions shown, 18 images · non-contrast
Comparison: Plain film of the chest [DATE]. CT chest
[DATE].

CLINICAL DATA: Hemoptysis.

EXAM:
CT CHEST WITHOUT CONTRAST
TECHNIQUE: Multidetector CT imaging of the chest was performed following the
standard protocol without IV contrast.

[Series 3: chest w/o 2mm st · axial · non-contrast · 0.80mm/px · z∈[-338,-50]mm · 12 of 172 slices shown, 15 images]
[im 14/172  mediastinal]
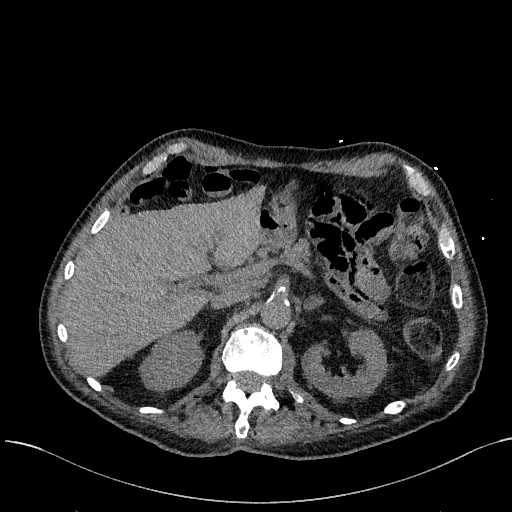
[im 14/172  lung]
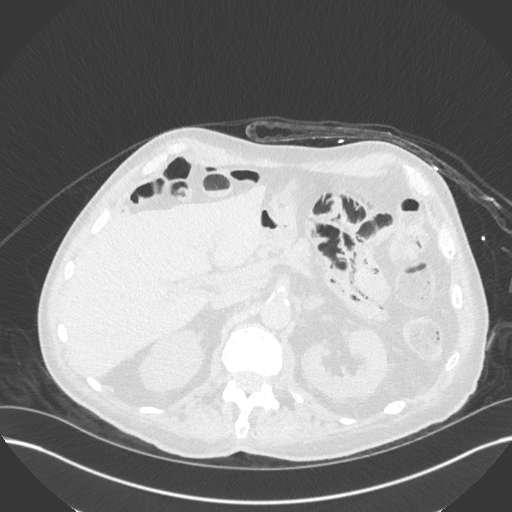
[im 27/172  lung]
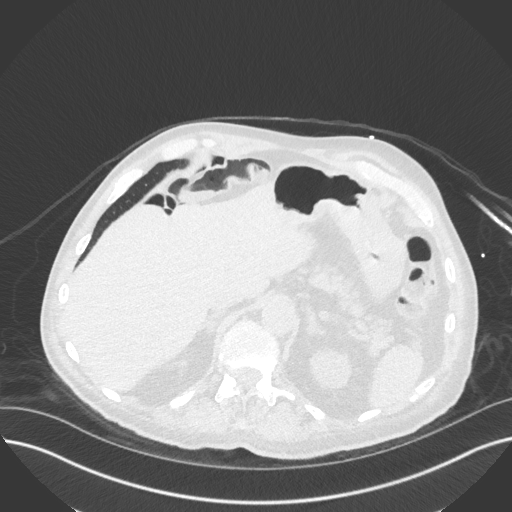
[im 40/172  lung]
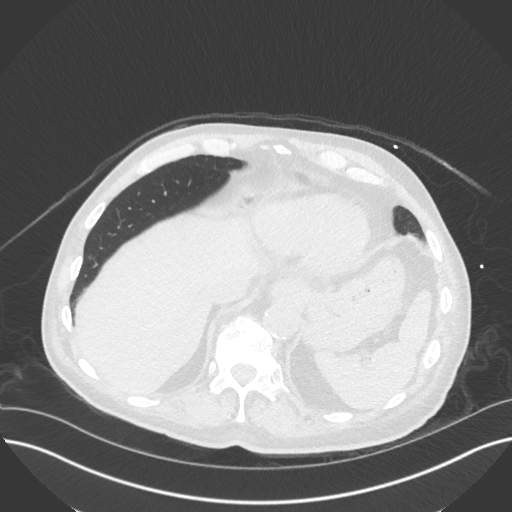
[im 53/172  lung]
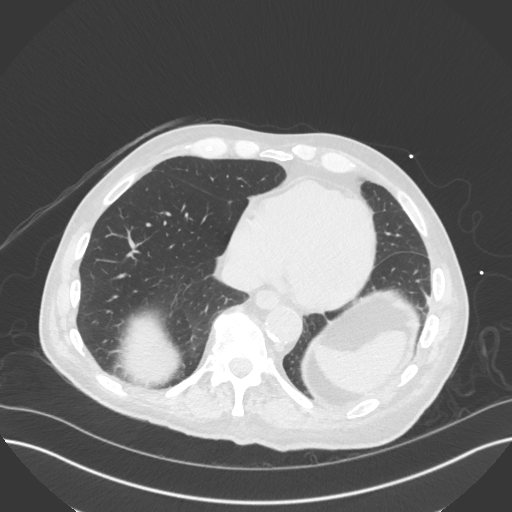
[im 66/172  mediastinal]
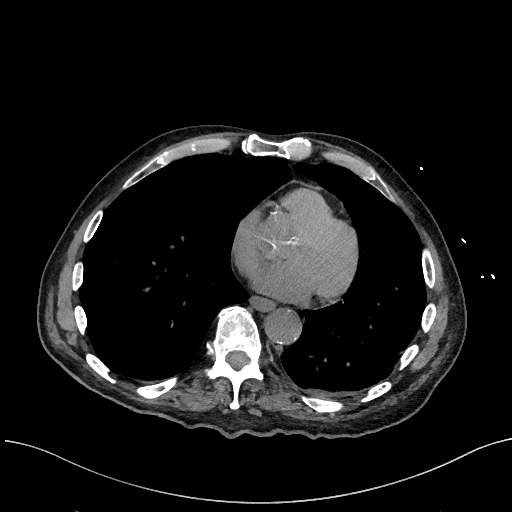
[im 66/172  lung]
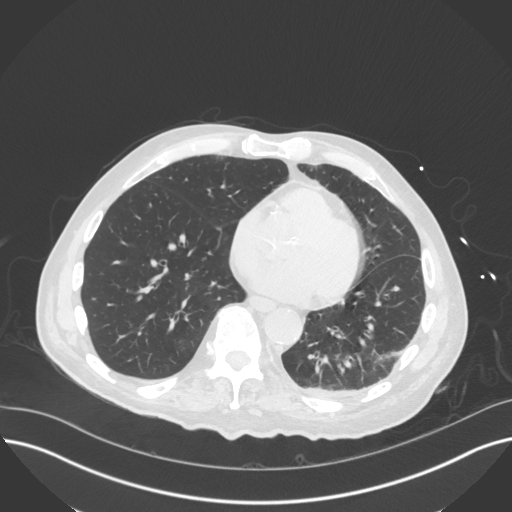
[im 79/172  lung]
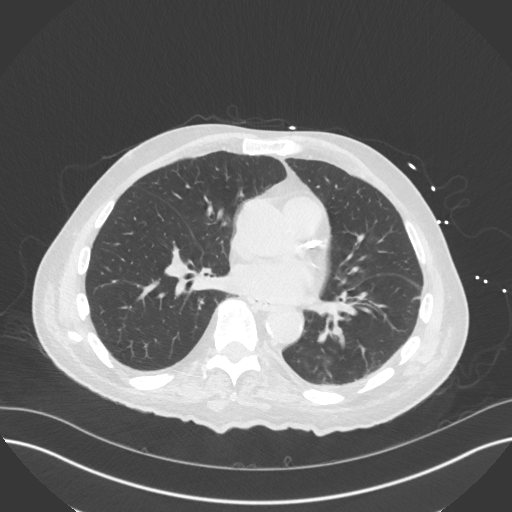
[im 93/172  lung]
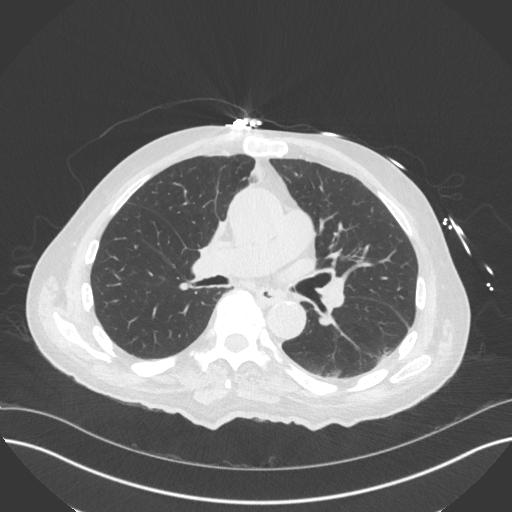
[im 106/172  lung]
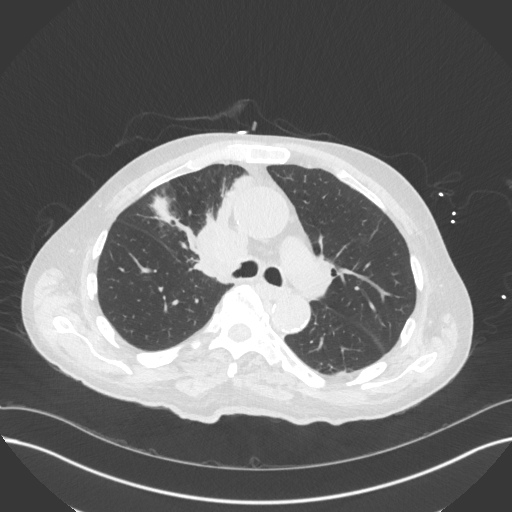
[im 119/172  mediastinal]
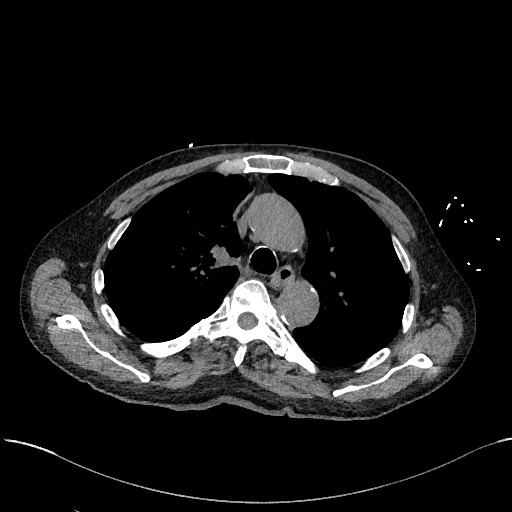
[im 119/172  lung]
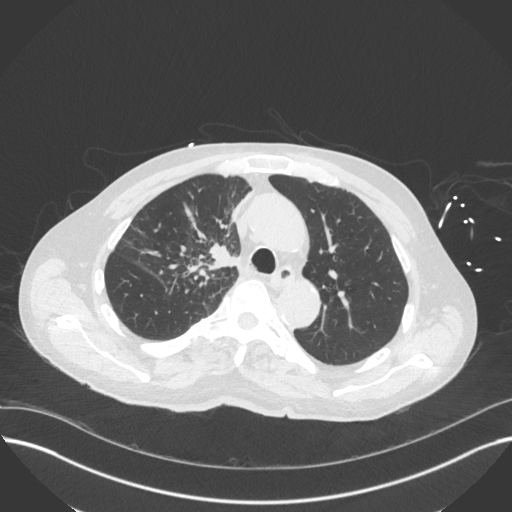
[im 132/172  lung]
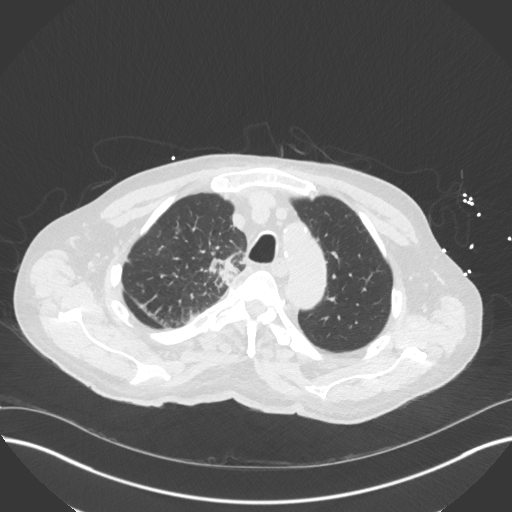
[im 145/172  lung]
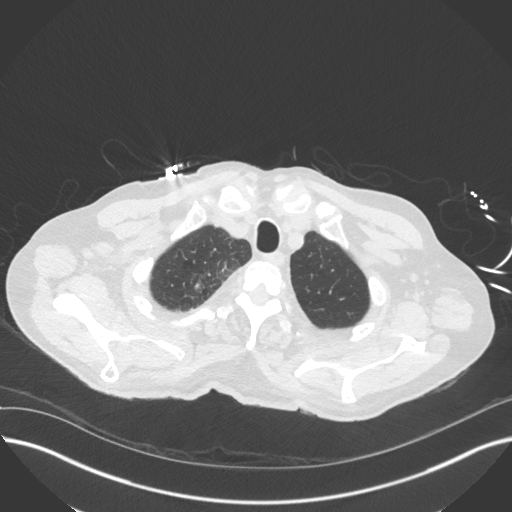
[im 158/172  lung]
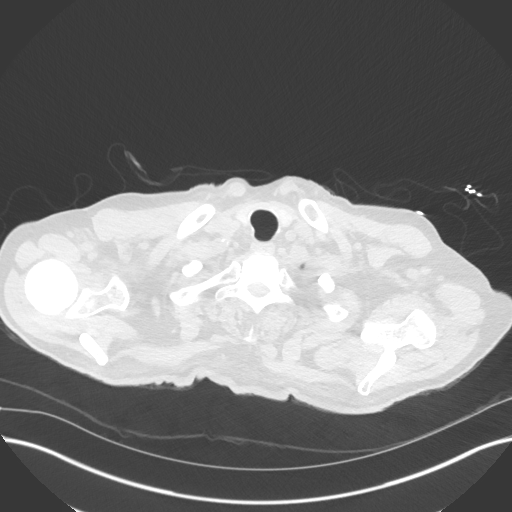

[Series 5: chest w/o 2mm st cor · coronal · non-contrast · 0.67mm/px · 3 of 109 slices shown]
[im 22/109  lung]
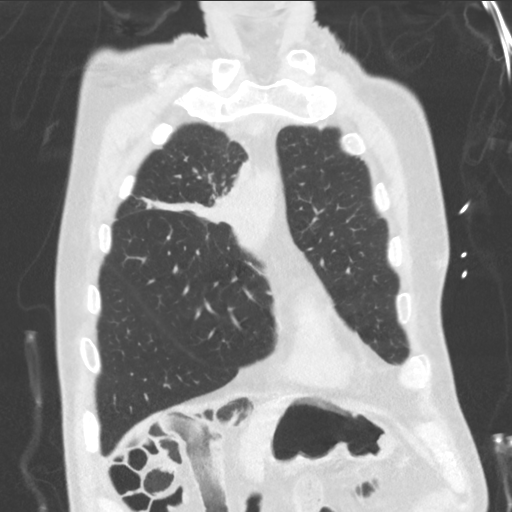
[im 44/109  lung]
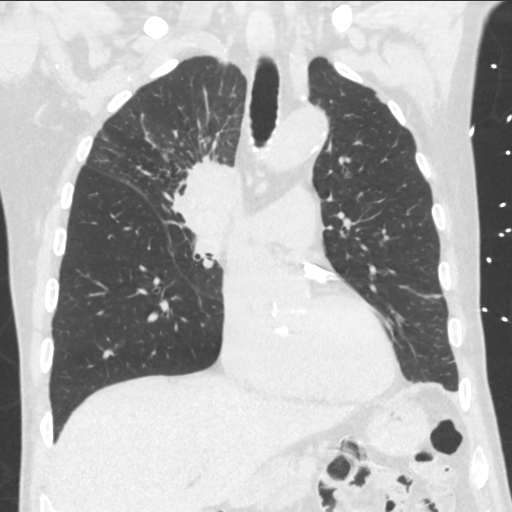
[im 65/109  lung]
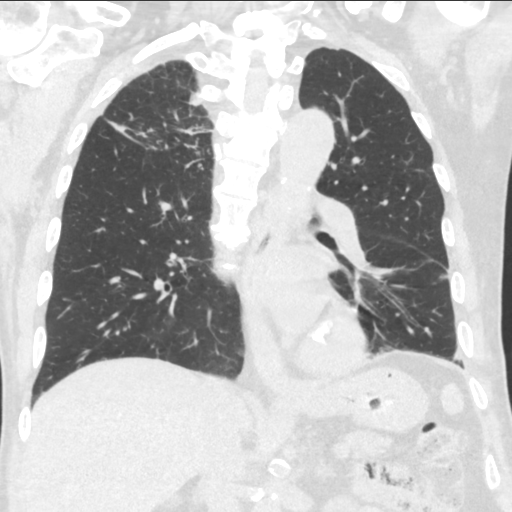

[15 of 36 positions shown; findings below may reference images not displayed]

FINDINGS: Cardiovascular: No significant vascular findings. Normal heart size.
No pericardial effusion. Calcific aortic and coronary
atherosclerosis noted.

Mediastinum/Nodes: A right hilar mass abutting the superior aspect
of the right mainstem bronchus and trachea measures 3.9 cm AP x
cm transverse on image 67 of series 3 x 5.2 cm craniocaudal on
coronal image 45, series 5. Amorphous calcifications are present
within the lesion. The thyroid gland and esophagus appear normal.

Lungs/Pleura: Reticulonodular opacities are seen in the right upper
lobe. A nodule along the major fissure in the right upper lobe
measures 1.4 cm transverse by 0.6 cm AP on image 39 of series 4. An
irregularly-shaped right upper lobe nodule measures 2.3 x 2.5 cm on
image 42 of series 4. There is also some discoid atelectasis in the
anterior right upper lobe which is likely postobstructive. Mild
dependent atelectasis is seen in the left lung base. There is also
some peribronchial thickening in the lower lobes bilaterally.

Upper Abdomen: Negative.

Musculoskeletal: No lytic or sclerotic lesion. Convex right
scoliosis and multilevel degenerative disease noted.
IMPRESSION: Right hilar mass consistent with carcinoma. Amorphous calcifications
within the mass are suggestive of small cell lung carcinoma.
Reticulonodular opacities in the right upper lobe, a subpleural
nodule along the major fissure and nodule in the upper lobe are most
consistent with tumor spread.

Aortic Atherosclerosis ([C0]-[C0]). Calcific coronary artery
disease also noted.

## 2020-07-05 IMAGING — CT CT ABD-PELV W/ CM
2 of 5 series · 16 of 46 positions shown, 18 images · IV contrast (Omni 300)
Comparison: [DATE]

CLINICAL DATA: Lung cancer.  LABELLE SALHA diagnosis.  Staging examination.

EXAM:
CT ABDOMEN AND PELVIS WITH CONTRAST
TECHNIQUE: Multidetector CT imaging of the abdomen and pelvis was performed
using the standard protocol following bolus administration of
intravenous contrast.
CONTRAST:  100mL OMNIPAQUE IOHEXOL 300 MG/ML  SOLN

[Series 3: a/p w/ 5mm · axial · 0.73mm/px · z∈[-373,+47]mm · 13 of 94 slices shown, 15 images]
[im 5/94  soft-tissue]
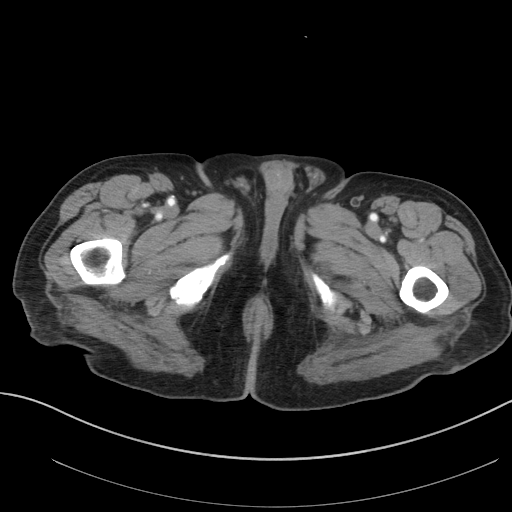
[im 5/94  bone]
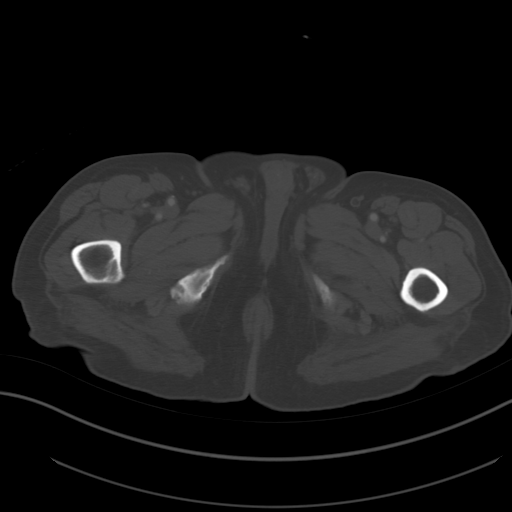
[im 14/94  soft-tissue]
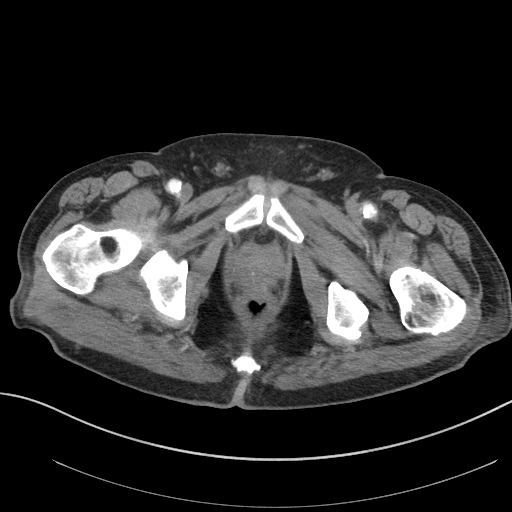
[im 19/94  soft-tissue]
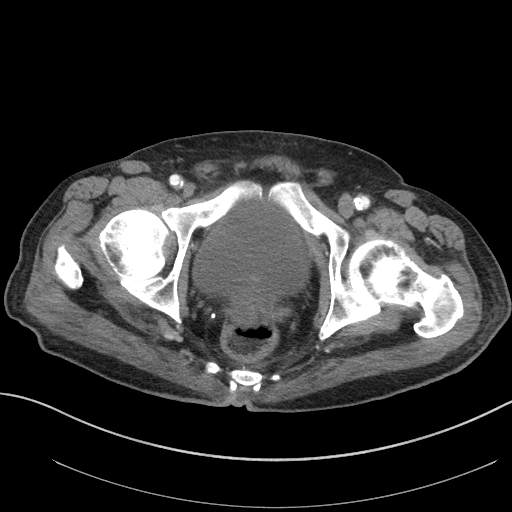
[im 28/94  soft-tissue]
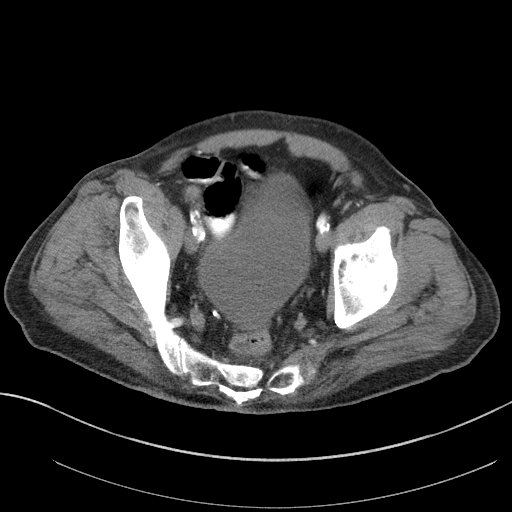
[im 33/94  soft-tissue]
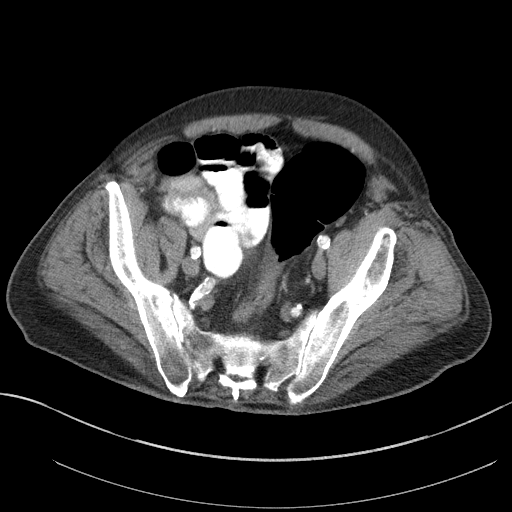
[im 42/94  soft-tissue]
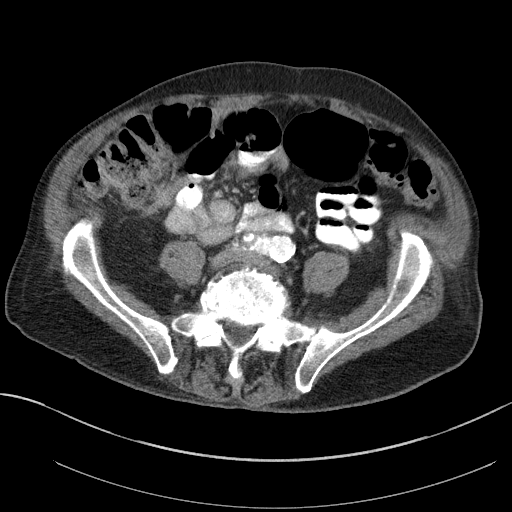
[im 47/94  soft-tissue]
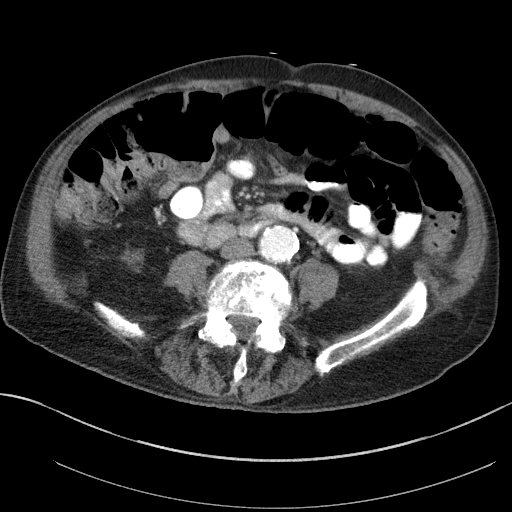
[im 52/94  soft-tissue]
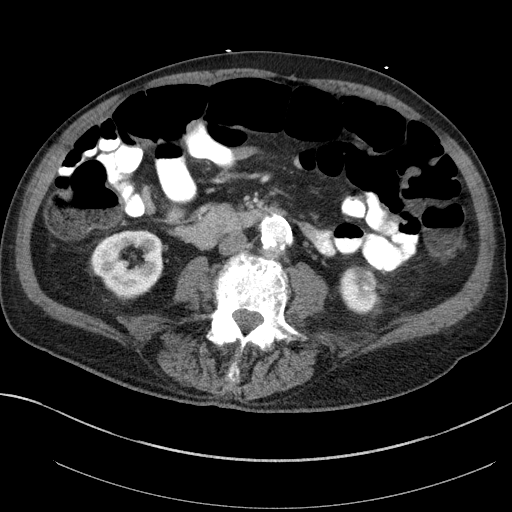
[im 61/94  soft-tissue]
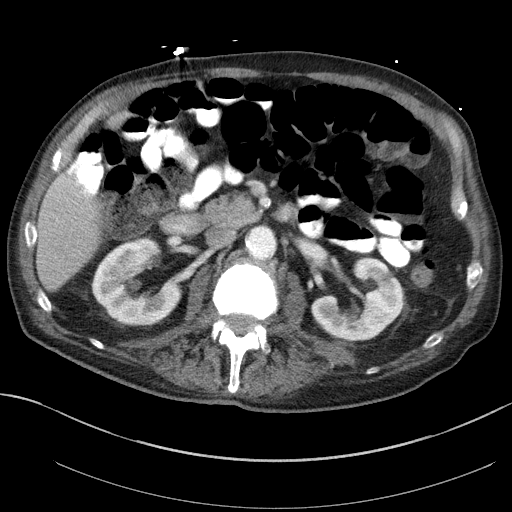
[im 61/94  bone]
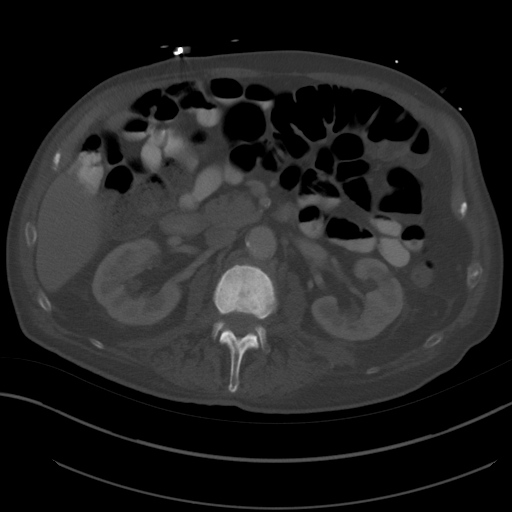
[im 66/94  soft-tissue]
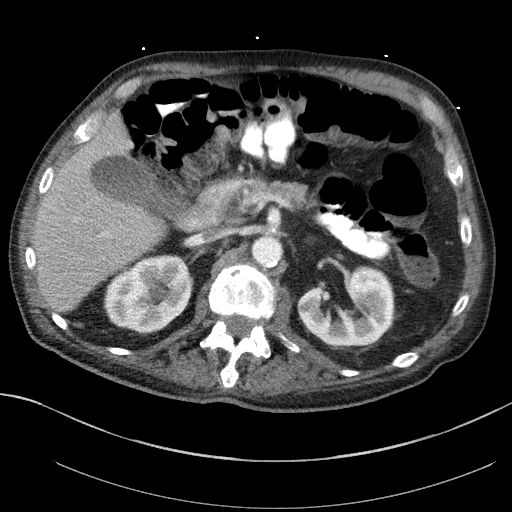
[im 75/94  soft-tissue]
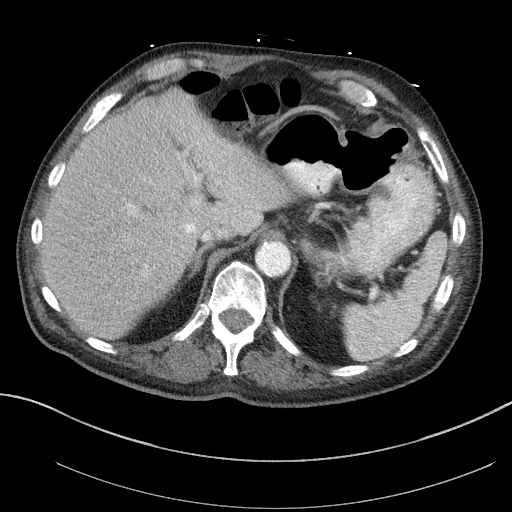
[im 80/94  soft-tissue]
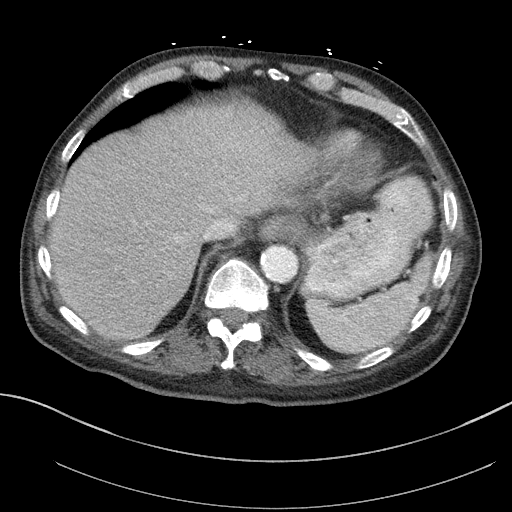
[im 89/94  soft-tissue]
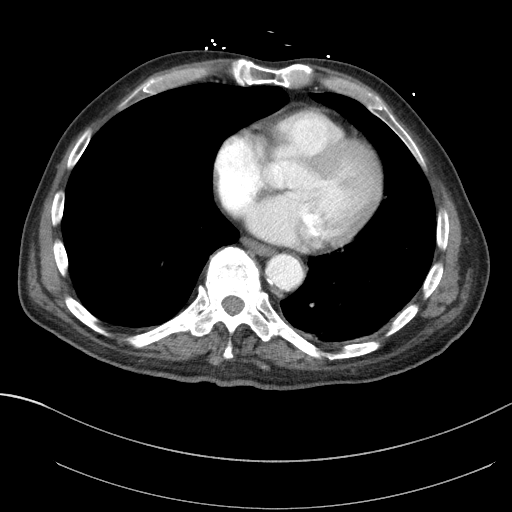

[Series 5: a/p w/ cor · coronal · 0.82mm/px · 3 of 149 slices shown]
[im 50/149  soft-tissue]
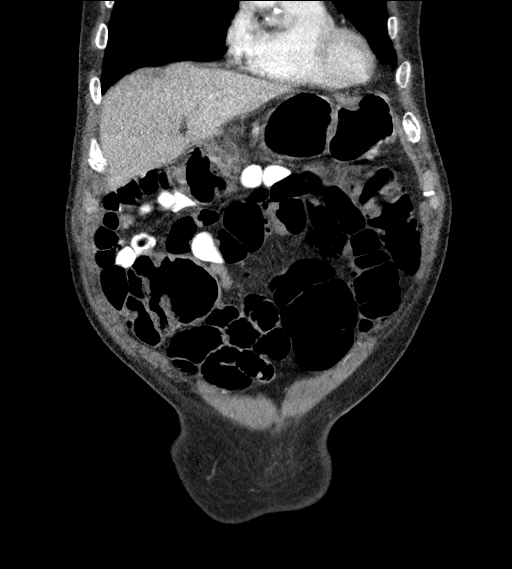
[im 66/149  soft-tissue]
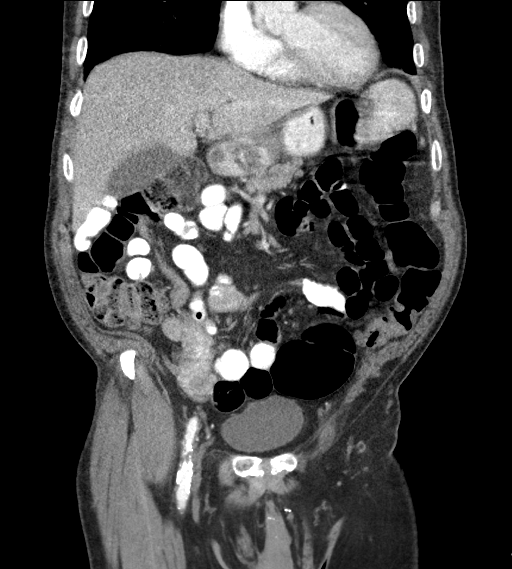
[im 83/149  soft-tissue]
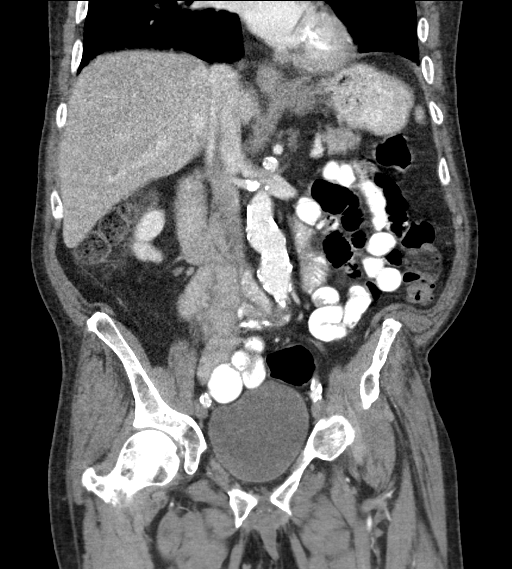

[16 of 46 positions shown; findings below may reference images not displayed]

FINDINGS: Lower chest: Minimal left basilar atelectasis. Mild coronary artery
calcification.

Hepatobiliary: No focal liver abnormality is seen. No gallstones,
gallbladder wall thickening, or biliary dilatation.

Pancreas: Unremarkable

Spleen: Unremarkable

Adrenals/Urinary Tract: The adrenal glands are unremarkable. The
kidneys are normal in size and position. Scattered areas of renal
cortical atrophy have developed bilaterally,, more prevalent within
the lower poles bilaterally. No enhancing intrarenal masses. No
intrarenal or ureteral calcifications. No hydronephrosis. The
bladder is unremarkable.

Stomach/Bowel: Moderate sigmoid diverticulosis. The stomach, small
bowel, and large bowel are otherwise unremarkable. Appendix normal.
No free intraperitoneal gas or fluid.

Vascular/Lymphatic: Extensive aortoiliac atherosclerotic
calcification. Bilobed fusiform infrarenal abdominal aortic aneurysm
has developed with maximal transaxial dimensions of 3.2 x 3.0 cm
(coronal image # 86/sagittal image # 102). No pathologic adenopathy
within the abdomen and pelvis.

Reproductive: Prostate is unremarkable.

Other: Tiny fat containing umbilical hernia.  Rectum unremarkable.

Musculoskeletal: No lytic or blastic bone lesions are identified.
Degenerative changes are noted throughout the visualized
thoracolumbar spine. No acute bone abnormality.
IMPRESSION: No evidence of metastatic disease within the abdomen and pelvis.

Interval development of 3.2 cm infrarenal abdominal aortic aneurysm
Recommend follow-up ultrasound every 3 years. This recommendation
follows ACR consensus guidelines: White Paper of the ACR Incidental
Findings Committee II on Vascular Findings. [HOSPITAL] [RP];
[DATE].

Moderate sigmoid diverticulosis.

Aortic aneurysm NOS ([RP]-[RP]). Aortic Atherosclerosis
([RP]-[RP]).

## 2020-07-05 SURGERY — CANCELLED PROCEDURE
Anesthesia: Monitor Anesthesia Care

## 2020-07-05 MED ORDER — IOHEXOL 9 MG/ML PO SOLN
ORAL | Status: AC
  Administered 2020-07-05: 500 mL
  Filled 2020-07-05: qty 1000

## 2020-07-05 MED ORDER — SODIUM CHLORIDE 0.9 % IV SOLN: INTRAVENOUS | Status: DC

## 2020-07-05 MED ORDER — IOHEXOL 300 MG/ML  SOLN
100.0000 mL | Freq: Once | INTRAMUSCULAR | Status: AC | PRN
  Administered 2020-07-05: 100 mL via INTRAVENOUS

## 2020-07-05 MED ORDER — SODIUM CHLORIDE 0.9 % IV SOLN
INTRAVENOUS | Status: AC | PRN
  Administered 2020-07-05: 500 mL via INTRAMUSCULAR

## 2020-07-05 NOTE — TOC Progression Note (Signed)
Transition of Care [facility]) - Progression Note    Patient Details  Name: Cody Douglas MRN: [id number] Date of Birth: 06/23/1950  Transition of Care [facility]) CM/SW Contact  Coralee [place], [place] Phone Number: 07/05/2020, 5:11 PM  Clinical Narrative:    CSW was notified by MD pt may be medically ready to DC tomorrow. CSW followed up with facility, they had spoken to dtr, and were concerned that dtr had mentioned pt was still coughing up blood. CSW confirmed with MD that he no longer was and facility noted beds will be available in first come basis. CSW started insurance auth and MD ordered covid in preparation for DC tomorrow. SW will continue to follow.    Expected Discharge Plan: [place] Barriers to Discharge: Insurance Authorization,Continued Medical Work up,Active Substance Use - Placement  Expected Discharge Plan and Services Expected Discharge Plan: [place] Choice: [place] Living arrangements for the past 2 months: Single Family Home                                       Social Determinants of Health (SDOH) Interventions    Readmission Risk Interventions No flowsheet data found.

## 2020-07-05 NOTE — Interval H&P Note (Signed)
History and Physical Interval Note:  07/05/2020 8:54 AM  Cody Douglas  has presented today for surgery, with the diagnosis of AORTIC VALVE VEGETATION.  The various methods of treatment have been discussed with the patient and family. After consideration of risks, benefits and other options for treatment, the patient has consented to  Procedure(s): TRANSESOPHAGEAL ECHOCARDIOGRAM (TEE) (N/A) as a surgical intervention.  The patient's history has been reviewed, patient examined, no change in status, stable for surgery.  I have reviewed the patient's chart and labs.  Questions were answered to the patient's satisfaction.     Fransico Him

## 2020-07-05 NOTE — Progress Notes (Signed)
Patient came down to endoscopy. Dr Radford Pax elected to cancel procedure due to active hemoptysis.

## 2020-07-05 NOTE — Progress Notes (Addendum)
Physical Therapy Treatment Patient Details Name: Cody Douglas MRN: [id number] DOB: May 26, 1950 Today's Date: 07/05/2020    History of Present Illness Pt is a 70 y/o male presenting to the ED with slurred speech and facial droop. 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 Pt developed hemoptysis and CT revealed lung mass like lung CA. PMH includes hypertrophic cardiomyopathy, hyponatremia, hypokalemia, chest pain, tobacco use, ETOH abuse,hypertension, prostate cancer.    PT Comments    Pt making steady progress with mobility and able to amb some in the hall. Continue to feel he needs ST-SNF for further rehab.    Follow Up Recommendations  SNF;Supervision/Assistance - 24 hour     Equipment Recommendations  Rolling walker with 5" wheels;3in1 (PT)    Recommendations for Other Services       Precautions / Restrictions Precautions Precautions: Fall Precaution Comments: Reports hx of falls    Mobility  Bed Mobility Overal bed mobility: Needs Assistance Bed Mobility: Sit to Supine;Rolling;Sidelying to Sit Rolling: Mod assist Sidelying to sit: Mod assist;HOB elevated       General bed mobility comments: Assist to bring hips and shoulders over to roll. Assist to elevate trunk into sitting.    Transfers Overall transfer level: Needs assistance Equipment used: Rolling walker (2 wheeled) Transfers: Sit to/from Stand Sit to Stand: Mod assist;From elevated surface         General transfer comment: Assist to bring hips up. Verbal cues for hand placement. Very slow to rise with back pain.  Ambulation/Gait Ambulation/Gait assistance: Min assist Gait Distance (Feet): 100 Feet Assistive device: Rolling walker (2 wheeled) Gait Pattern/deviations: Step-through pattern;Trunk flexed;Decreased step length - left Gait velocity: decreased Gait velocity interpretation: <1.31 ft/sec, indicative of household  ambulator General Gait Details: Assist for balance and support. Verbal cues to stay closer to walker with feet inside walker especially with turns.   Stairs             Wheelchair Mobility    Modified Rankin (Stroke Patients Only) Modified Rankin (Stroke Patients Only) Pre-Morbid Rankin Score: No significant disability Modified Rankin: Moderately severe disability     Balance Overall balance assessment: Needs assistance Sitting-balance support: No upper extremity supported;Feet supported Sitting balance-Leahy Scale: Fair     Standing balance support: Bilateral upper extremity supported;During functional activity Standing balance-Leahy Scale: Poor Standing balance comment: walker and min assist for static standing                            Cognition Arousal/Alertness: Awake/alert Behavior During Therapy: Flat affect Overall Cognitive Status: Impaired/Different from baseline Area of Impairment: Attention;Awareness;Safety/judgement;Problem solving                   Current Attention Level: Selective     Safety/Judgement: Decreased awareness of safety;Decreased awareness of deficits Awareness: Emergent Problem Solving: Slow processing        Exercises      General Comments        Pertinent Vitals/Pain Pain Assessment: Faces Faces Pain Scale: Hurts little more Pain Location: back Pain Descriptors / Indicators: Aching;Grimacing Pain Intervention(s): Monitored during session;Limited activity within patient's tolerance;Repositioned    Home Living                      Prior Function            PT Goals (current goals can now be found  in the care plan section) Acute Rehab PT Goals Patient Stated Goal: none stated Progress towards PT goals: Progressing toward goals    Frequency    Min 3X/week      PT Plan Current plan remains appropriate    Co-evaluation              AM-PAC PT "6 Clicks" Mobility   Outcome  Measure  Help needed turning from your back to your side while in a flat bed without using bedrails?: A Lot Help needed moving from lying on your back to sitting on the side of a flat bed without using bedrails?: A Lot Help needed moving to and from a bed to a chair (including a wheelchair)?: A Lot Help needed standing up from a chair using your arms (e.g., wheelchair or bedside chair)?: A Lot Help needed to walk in hospital room?: A Little Help needed climbing 3-5 steps with a railing? : A Lot 6 Click Score: 13    End of Session Equipment Utilized During Treatment: Gait belt Activity Tolerance: Patient tolerated treatment well Patient left: with call bell/phone within reach;in chair;with chair alarm set Nurse Communication: Mobility status PT Visit Diagnosis: Unsteadiness on feet (R26.81);Muscle weakness (generalized) (M62.81);History of falling (Z91.81)     Time: 7035-0093 PT Time Calculation (min) (ACUTE ONLY): 23 min  Charges:  $Gait Training: 23-37 mins                     [place] Pager [phone] Office [place] 07/05/2020, 2:19 PM

## 2020-07-05 NOTE — Progress Notes (Signed)
Patient came down to Endo with active hemoptysis.  Cxray showed opacity from the right hilum to the periphery with fullness in the right hilar region >>CT was recommended.  He is coughing up frank blood at this time.  Discussed with anesthesia.  Will cancel procedure until etiology of hemoptysis determined.  This was discussed with TRH.

## 2020-07-05 NOTE — Care Management Important Message (Signed)
Important Message  Patient Details  Name: Cody Douglas MRN: [id number] Date of Birth: [birth year]   Medicare Important Message Given:  Yes     [name] 07/05/2020, 1:57 PM

## 2020-07-05 NOTE — Progress Notes (Signed)
PROGRESS NOTE    Cody Douglas  YCX:[id number] DOB: Jun 28, 1950 DOA: 06/30/2020 PCP: Tamsen Roers, MD    Brief Narrative:  Cody Douglas was admitted to the hospital withtheworking diagnosis of acute ischemic infarct, rightexternal capsuleand caudate body. Hospitalization complicated with hemoptysis.   70 year old male past medical history for hypertrophic cardiomyopathy, hyponatremia, hypokalemia, alcohol andtobacco abuse, hypertension and prostate cancer who presented with slurred speech and facial droop. Family found patient with slight left facial droop and slurred speech apparently acute onset of symptoms. EMS was called and patient was brought to the hospital. Patient with ongoing alcohol abuse. On his initial physical examination blood pressure 133/76, heart rate 106, respiratory rate 22, temperature 95, oxygen saturation 93%, lungs are clear to auscultation bilaterally, heart S1-S2, present rhythmic, soft abdomen, no lower extremity edema. Positive left facial droop and slurred speech, left upper extremity 4-5/5 strength.  Sodium 134, potassium 3.4, chloride 100, bicarb 23, glucose 94, BUN 11, creatinine 0.58, magnesium 1.9.White count 12.7, hemoglobin 14.2, hematocrit 43.1, platelets 291. SARS COVID-19 negative.  Brain MRI with acute infarction affecting the external capsule and caudatebody on the right. No mass-effect or hemorrhage. 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  EKG 83 bpm, normal axis, right bundle branch block, sinus rhythm, no st segment or T wave changes.  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  Echocardiogram with aortic valve bulky calcific lesion, pending TEE to complete workup.  Patient very weak and deconditioned, plan to transfer to SNF to continue physical therapy.   Today patient with hemoptysis., moderate in intensity. Anticoagulation was held.  04/25 TEE cancelled due to persistent  hemoptysis.  CT chest with right hilar mass, with calcifications suggesting small cell lung carcinoma.   Assessment & Plan:   Principal Problem:   Acute CVA (cerebrovascular accident) [facility]) Active Problems:   Tobacco abuse   Essential hypertension   Alcohol use   Leukocytosis   CVA (cerebral vascular accident) ([place])   1. 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 MRA severe stenosis right M1, filling defect, may represent thrombus or atherosclerotic plaque.  Echocardiogram with preserved LV EF but positive severe bulky calcification/ mass on the non coronary cusp and commisure between the right and non cusps.   Improving neurologic deficits. Will resume po diet as tolerated.  Continue with neuro checks per unit protocol.   Continue to hold on aspirin and clopidogrel due to persistent hemoptysis. Discontinue enoxaparin.   On hold TEE due to hemoptysis.   2. New hemoptysis. His hgb has been stable at 13., bleeding seems to be slowing down.  Oxygenation is 97 to 95% on room air.   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  Will need further workup with bronchoscopy, once off antiplatelet therapy for at least 7 days.  Continue to hold on aspirin and clopidogrel, continue as needed antitussive agents.   Will consult Pulmonary for outpatient bronch.   3. HTN.Continue blood pressure control with metoprolol.  3. Tobacco and alcohol abuse.Continue with as needed alprazolam PRN for anxiety. Continue with 100 mg thiamine daily and multivitamins.  4, leukocytosis.resolved with wbc down to 9,9    5, Chronic pain syndrome. Continue pain control withPRNoxycodone and lower dose ofcyclobenzaprine.   Patient continue to be at high risk for worsening hemoptysis.   Status is: Inpatient  Remains inpatient  appropriate  because:Inpatient level of care appropriate due to severity of illness   Dispo: The patient is from: Home              Anticipated d/c is to: SNF              Patient currently is not medically stable to d/c.   Difficult to place patient No  DVT prophylaxis: scd   Code Status:   full  Family Communication:  I spoke with patient's sn and wife at the bedside, we talked in detail about patient's condition, plan of care and prognosis and all questions were addressed.      Consultants:   Neurology   Cardiology TEE    Subjective: Patient with persistent hemoptysis but decreased in intensity, no nausea or vomiting, no dyspnea or chest pain.   Objective: Vitals:   07/05/20 0526 07/05/20 0750 07/05/20 0837 07/05/20 1234  BP: (!) 143/75 133/65 (!) 143/65 117/72  Pulse: 75 73 79 70  Resp:  18 17 18   Temp:   98.1 F (36.7 C)   TempSrc:   Temporal   SpO2: 96% 95% 97% 95%  Weight:   72.6 kg   Height:   5\' 11"  (1.803 m)     Intake/Output Summary (Last 24 hours) at 07/05/2020 1253 Last data filed at 07/04/2020 1800 Gross per 24 hour  Intake --  Output 200 ml  Net -200 ml   Filed Weights   07/05/20 0837  Weight: 72.6 kg    Examination:   General: Not in pain or dyspnea, deconditioned  Neurology: Awake and alert, non focal  E ENT: no pallor, no icterus, oral mucosa moist Cardiovascular: No JVD. S1-S2 present, rhythmic, no gallops, rubs, or murmurs. No lower extremity edema. Pulmonary: positive breath sounds bilaterally, no wheezing or rhonchi Gastrointestinal. Abdomen soft and non tender Skin. No rashes Musculoskeletal: no joint deformities     Data Reviewed: I have personally reviewed following labs and imaging studies  CBC: Recent Labs  Lab 06/30/20 1905 06/30/20 1938 07/05/20 0325  WBC 12.7*  --  9.9  NEUTROABS 10.2*  --  6.9  HGB 14.2 13.9 13.2  HCT 43.1 41.0 40.4  MCV 95.1  --  95.3  PLT 291  --  947   Basic Metabolic Panel: Recent Labs   Lab 06/30/20 1938 06/30/20 2200 07/05/20 0325  NA 136 134* 137  K 3.9 3.4* 3.8  CL 101 100 106  CO2  --  23 24  GLUCOSE 93 94 115*  BUN 15 11 14   CREATININE 0.50* 0.58* 0.56*  CALCIUM  --  9.0 9.1  MG  --  1.9  --    GFR: Estimated Creatinine Clearance: 87 mL/min (A) (by C-G formula based on SCr of 0.56 mg/dL (L)). Liver Function Tests: Recent Labs  Lab 06/30/20 2200  AST 118*  ALT 33  ALKPHOS 79  BILITOT 0.8  PROT 6.3*  ALBUMIN 3.4*   No results for input(s): LIPASE, AMYLASE in the last 168 hours. No results for input(s): AMMONIA in the last 168 hours. Coagulation Profile: No results for input(s): INR, PROTIME in the last 168 hours. Cardiac Enzymes: Recent Labs  Lab 06/30/20 2200  CKTOTAL 6,158*   BNP (last 3 results) No results for input(s): PROBNP in the last 8760 hours. HbA1C: No results for input(s): HGBA1C in the last 72 hours. CBG: No results for input(s): GLUCAP in the last 168 hours. Lipid Profile: No results for input(s): CHOL, HDL, LDLCALC, TRIG, CHOLHDL, LDLDIRECT in  the last 72 hours. Thyroid Function Tests: No results for input(s): TSH, T4TOTAL, FREET4, T3FREE, THYROIDAB in the last 72 hours. Anemia Panel: No results for input(s): VITAMINB12, FOLATE, FERRITIN, TIBC, IRON, RETICCTPCT in the last 72 hours.    Radiology Studies: I have reviewed all of the imaging during this hospital visit personally     Scheduled Meds: .  stroke: mapping our early stages of recovery book   Does not apply Once  . ALPRAZolam  0.5 mg Oral TID  . atorvastatin  40 mg Oral Daily  . cyclobenzaprine  5 mg Oral TID  . folic acid  1 mg Oral Daily  . metoprolol succinate  25 mg Oral Daily  . multivitamin  1 tablet Oral Daily  . pantoprazole  40 mg Oral Daily  . polyethylene glycol  17 g Oral Daily  . thiamine  100 mg Oral Daily   Continuous Infusions:   LOS: 4 days        Cody Brisbon Gerome Apley, MD

## 2020-07-06 DIAGNOSIS — R918 Other nonspecific abnormal finding of lung field: Secondary | ICD-10-CM

## 2020-07-06 DIAGNOSIS — Z720 Tobacco use: Secondary | ICD-10-CM | POA: Diagnosis not present

## 2020-07-06 DIAGNOSIS — Z7289 Other problems related to lifestyle: Secondary | ICD-10-CM | POA: Diagnosis not present

## 2020-07-06 DIAGNOSIS — D72829 Elevated white blood cell count, unspecified: Secondary | ICD-10-CM | POA: Diagnosis not present

## 2020-07-06 DIAGNOSIS — I639 Cerebral infarction, unspecified: Principal | ICD-10-CM

## 2020-07-06 DIAGNOSIS — C3411 Malignant neoplasm of upper lobe, right bronchus or lung: Secondary | ICD-10-CM | POA: Diagnosis present

## 2020-07-06 DIAGNOSIS — I10 Essential (primary) hypertension: Secondary | ICD-10-CM | POA: Diagnosis not present

## 2020-07-06 LAB — HEMOGLOBIN AND HEMATOCRIT, BLOOD
HCT: 37.2 % — ABNORMAL LOW (ref 39.0–52.0)
Hemoglobin: 12.2 g/dL — ABNORMAL LOW (ref 13.0–17.0)

## 2020-07-06 NOTE — Plan of Care (Signed)

## 2020-07-06 NOTE — Progress Notes (Addendum)
PROGRESS NOTE    Cody Douglas  XFG:[id number] DOB: 12/24/1950 DOA: 06/30/2020 PCP: Tamsen Roers, MD    Brief Narrative:  Mr. Havey was admitted to the hospital withtheworking diagnosis of acute ischemic infarct, rightexternal capsuleand caudate body. Hospitalization complicated with hemoptysis due to a NEW diagnosed right pulmonary hilar mass.   70 year old male past medical history for hypertrophic cardiomyopathy, hyponatremia, hypokalemia, alcohol andtobacco abuse, hypertension and prostate cancer who presented with slurred speech and facial droop. Family found patient with slight left facial droop and slurred speech apparently acute onset of symptoms. EMS was called and patient was brought to the hospital. Patient with ongoing alcohol abuse. On his initial physical examination blood pressure 133/76, heart rate 106, respiratory rate 22, temperature 95, oxygen saturation 93%, lungs were clear to auscultation bilaterally, heart S1-S2, present rhythmic, soft abdomen, no lower extremity edema. Positive left facial droop and slurred speech, left upper extremity 4-5/5 strength.  Sodium 134, potassium 3.4, chloride 100, bicarb 23, glucose 94, BUN 11, creatinine 0.58, magnesium 1.9.White count 12.7, hemoglobin 14.2, hematocrit 43.1, platelets 291. SARS COVID-19 negative.  Brain MRI with acute infarction affecting the external capsule and caudatebody on the right. No mass-effect or hemorrhage. 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  EKG 83 bpm, normal axis, right bundle branch block, sinus rhythm, no st segment or T wave changes.  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  Echocardiogram with aortic valve bulky calcific lesion, pending TEE to complete workup, study was cancelled due to hemoptysis.   Positive moderate hemoptysis, further work up with CT chest showed right hilar mass, with calcifications suggesting small cell lung  carcinoma.  CT abdomen and pelvis with no signs of metastatic disease.   No further hemoptysis, last dose of clopidogrel and aspirin was on 04/25.    Assessment & Plan:   Principal Problem:   Acute CVA (cerebrovascular accident) ([place]) Active Problems:   Tobacco abuse   Essential hypertension   Alcohol use   Leukocytosis   CVA (cerebral vascular accident) ([place])   Hilar mass   1. 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 MRA severe stenosis right M1, filling defect, may represent thrombus or atherosclerotic plaque.  Echocardiogram with preserved LV EF but positive severe bulky calcification/ mass on the non coronary cusp and commisure between the right and non cusps.   Continue to have slurred speech and left sided weakness,   Holding  on aspirin and clopidogrel due hemoptysis.  TEE has been delayed until pulmonary more stable.   Patient continue to be very weak and deconditioned, plan to transfer to SNF for further physical and occupational therapy.   2. NEW right hilar mass, complicated with hemoptysis and acute blood loss anemia. No further hemoptysis, Hgb is down to 12,2 from 14.2 on admission. No current indication for PRBC transfusion.   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  CT of the abdomen and pelvis with no signs of metastatic disease.   Continue to hold on anticoagulation. Will check with pulmonary when will be best timing for diagnostic bronchoscopy.  Likely will need to wait at least 7 days after last dose of aspirin and clopidogrel, which was 07/05/20  Follow H&H in am. Continue with antitussive agents as needed.   3. HTN.On metoprolol for blood pressure control.   3. Tobacco and alcohol abuse.On  PRNalprazolam  for anxiety. On thiamine daily and multivitamins.  4,  leukocytosis.resolved.   5, Chronic pain syndrome. OnPRNoxycodone and lower dose ofcyclobenzaprine.    Status is: Inpatient  Remains inpatient appropriate because:Unsafe d/c plan and Inpatient level of care appropriate due to severity of illness   Dispo: The patient is from: Home              Anticipated d/c is to: SNF              Patient currently is medically stable to d/c.   Difficult to place patient No    DVT prophylaxis: scd   Code Status:   full  Family Communication:  No family at the bedside     Consultants:   Neurology   Cardiology for TEE    Subjective: Patient continue to be very weak and deconditioned, intermittent swallow dysfunction, no nausea or vomiting, no chest pain or dyspnea, No further hemoptysis.   Objective: Vitals:   07/05/20 2042 07/06/20 0114 07/06/20 0406 07/06/20 0759  BP: 135/75 136/74 133/74 119/74  Pulse: 74 74 74 81  Resp: 18 16 18 18   Temp: 97.6 F (36.4 C) 98.6 F (37 C) 98.3 F (36.8 C) 97.6 F (36.4 C)  TempSrc: Oral   Oral  SpO2: 93% 98% 96% 96%  Weight:      Height:       No intake or output data in the 24 hours ending 07/06/20 1043 Filed Weights   07/05/20 0837  Weight: 72.6 kg    Examination:   General: Not in pain or dyspnea, deconditioned  Neurology: Awake and alert, slow to respond and some slurred speech.  E ENT: no pallor, no icterus, oral mucosa moist Cardiovascular: No JVD. S1-S2 present, rhythmic, no gallops, rubs, or murmurs. No lower extremity edema. Pulmonary: positive breath sounds bilaterally, adequate air movement, no wheezing, rhonchi or rales. Gastrointestinal. Abdomen soft and non tender Skin. No rashes Musculoskeletal: no joint deformities     Data Reviewed: I have personally reviewed following labs and imaging studies  CBC: Recent Labs  Lab 06/30/20 1905 06/30/20 1938 07/05/20 0325 07/06/20 0333  WBC 12.7*  --  9.9  --   NEUTROABS 10.2*  --  6.9  --   HGB 14.2 13.9  13.2 12.2*  HCT 43.1 41.0 40.4 37.2*  MCV 95.1  --  95.3  --   PLT 291  --  322  --    Basic Metabolic Panel: Recent Labs  Lab 06/30/20 1938 06/30/20 2200 07/05/20 0325  NA 136 134* 137  K 3.9 3.4* 3.8  CL 101 100 106  CO2  --  23 24  GLUCOSE 93 94 115*  BUN 15 11 14   CREATININE 0.50* 0.58* 0.56*  CALCIUM  --  9.0 9.1  MG  --  1.9  --    GFR: Estimated Creatinine Clearance: 87 mL/min (A) (by C-G formula based on SCr of 0.56 mg/dL (L)). Liver Function Tests: Recent Labs  Lab 06/30/20 2200  AST 118*  ALT 33  ALKPHOS 79  BILITOT 0.8  PROT 6.3*  ALBUMIN 3.4*   No results for input(s): LIPASE, AMYLASE in the last 168 hours. No results for input(s): AMMONIA in the last 168 hours. Coagulation Profile: No results for input(s): INR, PROTIME in the last 168 hours. Cardiac Enzymes: Recent Labs  Lab 06/30/20 2200  CKTOTAL 6,158*   BNP (last 3 results) No results for input(s): PROBNP in the last 8760 hours. HbA1C: No results for  input(s): HGBA1C in the last 72 hours. CBG: No results for input(s): GLUCAP in the last 168 hours. Lipid Profile: No results for input(s): CHOL, HDL, LDLCALC, TRIG, CHOLHDL, LDLDIRECT in the last 72 hours. Thyroid Function Tests: No results for input(s): TSH, T4TOTAL, FREET4, T3FREE, THYROIDAB in the last 72 hours. Anemia Panel: No results for input(s): VITAMINB12, FOLATE, FERRITIN, TIBC, IRON, RETICCTPCT in the last 72 hours.    Radiology Studies: I have reviewed all of the imaging during this hospital visit personally     Scheduled Meds: .  stroke: mapping our early stages of recovery book   Does not apply Once  . ALPRAZolam  0.5 mg Oral TID  . atorvastatin  40 mg Oral Daily  . cyclobenzaprine  5 mg Oral TID  . folic acid  1 mg Oral Daily  . metoprolol succinate  25 mg Oral Daily  . multivitamin  1 tablet Oral Daily  . pantoprazole  40 mg Oral Daily  . polyethylene glycol  17 g Oral Daily  . thiamine  100 mg Oral Daily    Continuous Infusions:   LOS: 5 days        Hondo Nanda Gerome Apley, MD

## 2020-07-06 NOTE — Consult Note (Addendum)
NAME:  Cody Douglas, MRN:  [id number], DOB:  [birth year], LOS: 5 ADMISSION DATE:  06/30/2020, CONSULTATION DATE: 07/06/2020 REFERRING MD: Dr. Cathlean Sauer, CHIEF COMPLAINT: Lung mass  History of Present Illness:  This is a 70 year old gentleman past medical history of longstanding tobacco abuse since age 29 smoked until the day he was admitted to the hospital, history of CVA, hypertension prostate cancer.  Patient admitted to the hospital with complaints of hemoptysis.  Had a CT scan of the head with acute ischemic infarct and a right external capsule and caudate body CVA.  With the complaint of hemoptysis he underwent a CT scan of the chest CT of the chest on 07/05/2020 revealed a right hilar mass measuring 2.3 x 2.5 cm with an additional associated mass at the right mainstem bronchus and trachea at 3.9 x 4.3 cm.  Pertinent  Medical History   Past Medical History:  Diagnosis Date  . Hypertension   . Prostate cancer ([place])      Significant Hospital Events: Including procedures, antibiotic start and stop dates in addition to other pertinent events   . 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 rate 16, height _0  (1.803 m), weight 72.6 kg, SpO2 96 %.       No intake or output data in the 24 hours ending 07/06/20 1556 Filed Weights   07/05/20 0837  Weight: 72.6 kg    Examination: General: Chronically ill-appearing gentleman, resting in bed HENT: Tracking appropriately, dry mucous membranes Lungs: Diminished breath sounds bilaterally, bronchial breath sounds in the right Cardiovascular: Regular rhythm S1-S2 Abdomen: Soft nontender nondistended Extremities: No significant edema Neuro: Alert oriented following commands GU: Deferred  CT imaging 07/05/2020: RUL mass concerning for malignancy  I reviewed in the room with patient and his  daughter The patient's images have been independently reviewed by me.    Labs/imaging that I havepersonally reviewed  (right click and "Reselect all SmartList Selections" daily)  Hemoglobin 12.2 Sodium 137 Creatinine 0.56  Resolved Hospital Problem list     Assessment & Plan:   Right upper lobe hilar mass with associated adenopathy concerning for an advanced age bronchogenic carcinoma. Acute right ischemic CVA, external capsule, caudate body, holding dual antiplatelet therapy due to hemoptysis Acute hemoptysis secondary to malignancy concern as above. History of hypertension longstanding tobacco abuse Longstanding alcohol abuse Plan: Met with patient's daughter and patient at bedside. We discussed the risk benefits and alternatives of proceeding with video bronchoscopy with tissue biopsy. Patient has concern for advanced stage malignancy and will need mediastinal staging. We will plan for a combination navigation plus endobronchial ultrasound. However I do suspect he may have visible tumor within the right upper lobe takeoff and may not need the navigational support. Patient has been tentatively scheduled for 07/09/2020. Continue to hold Plavix, last dose Plavix was 07/04/2020. Also discussed the need for holding Plavix and risk of this with patient and patient's daughter.  They are agreeable to proceed.  Orders placed  I will see again Friday before procedure. N.p.o. midnight the night before.   Labs   CBC: Recent Labs  Lab 06/30/20 1905 06/30/20 1938 07/05/20 0325 07/06/20 0333  WBC 12.7*  --  9.9  --   NEUTROABS 10.2*  --  6.9  --   HGB 14.2 13.9 13.2 12.2*  HCT 43.1 41.0 40.4 37.2*  MCV 95.1  --  95.3  --   PLT 291  --  322  --     Basic Metabolic Panel: Recent Labs  Lab 06/30/20 1938 06/30/20 2200 07/05/20 0325  NA 136 134* 137  K 3.9 3.4* 3.8  CL 101 100 106  CO2  --  23 24  GLUCOSE 93 94 115*  BUN _0 CREATININE 0.50* 0.58* 0.56*  CALCIUM   --  9.0 9.1  MG  --  1.9  --    GFR: Estimated Creatinine Clearance: 87 mL/min (A) (by C-G formula based on SCr of 0.56 mg/dL (L)). Recent Labs  Lab 06/30/20 1905 07/05/20 0325  WBC 12.7* 9.9    Liver Function Tests: Recent Labs  Lab 06/30/20 2200  AST 118*  ALT 33  ALKPHOS 79  BILITOT 0.8  PROT 6.3*  ALBUMIN 3.4*   No results for input(s): LIPASE, AMYLASE in the last 168 hours. No results for input(s): AMMONIA in the last 168 hours.  ABG    Component Value Date/Time   TCO2 25 06/30/2020 1938     Coagulation Profile: No results for input(s): INR, PROTIME in the last 168 hours.  Cardiac Enzymes: Recent Labs  Lab 06/30/20 2200  CKTOTAL 6,158*    HbA1C: Hgb A1c MFr Bld  Date/Time Value Ref Range Status  07/01/2020 01:52 AM 6.3 (H) 4.8 - 5.6 % Final    Comment:    (NOTE) Pre diabetes:          5.7%-6.4%  Diabetes:              >6.4%  Glycemic control for   <7.0% adults with diabetes   12/13/2013 08:06 AM 5.7 (H) <5.7 % Final    Comment:    (NOTE)                                                                       According to the ADA Clinical Practice Recommendations for 2011, when HbA1c is used as a screening test:  >=6.5%   Diagnostic of Diabetes Mellitus           (if abnormal result is confirmed) 5.7-6.4%   Increased risk of developing Diabetes Mellitus References:Diagnosis and Classification of Diabetes Mellitus,Diabetes ZOXW,9604,54(UJWJX 1):S62-S69 and Standards of Medical Care in         Diabetes - 2011,Diabetes BJYN,8295,62 (Suppl 1):S11-S61.    CBG: No results for input(s): GLUCAP in the last 168 hours.  Review of Systems:   Review of Systems  Constitutional: Negative for chills, fever, malaise/fatigue and weight loss.  HENT: Negative for hearing loss, sore throat and tinnitus.   Eyes: Negative for blurred vision and double vision.  Respiratory: Positive for cough and shortness of breath. Negative for hemoptysis, sputum production,  wheezing and stridor.   Cardiovascular: Negative for chest pain, palpitations, orthopnea, leg swelling and PND.  Gastrointestinal: Negative for abdominal pain, constipation, diarrhea, heartburn, nausea and vomiting.  Genitourinary: Negative for dysuria, hematuria and urgency.  Musculoskeletal: Negative for joint pain and myalgias.  Skin: Negative for itching and rash.  Neurological: Negative for dizziness, tingling, weakness and headaches.  Endo/Heme/Allergies: Negative for environmental allergies. Does not bruise/bleed easily.  Psychiatric/Behavioral: Negative for depression. The patient is not nervous/anxious  and does not have insomnia.   All other systems reviewed and are negative.    Past Medical History:  He,  has a past medical history of Hypertension and Prostate cancer ([place]).   Surgical History:   Past Surgical History:  Procedure Laterality Date  . EYE SURGERY    . PROSTATE BIOPSY     Prostate surgery 8-9 yrs ago     Social History:   reports that he has been smoking cigarettes. He has a 100.00 pack-year smoking history. He does not have any smokeless tobacco history on file. He reports current alcohol use of about 1.0 standard drink of alcohol per week. He reports that he does not use drugs.   Family History:  His family history includes Cancer - Lung in his father; Heart disease in his brother. There is no history of Diabetes type II or Stroke.   Allergies No Known Allergies   Home Medications  Prior to Admission medications   Medication Sig Start Date End Date Taking? Authorizing Provider  ALPRAZolam Duanne Moron) 0.5 MG tablet Take 0.5 mg by mouth 3 (three) times daily. LF on 06-25-20 # 90 DS 30 06/25/20  Yes [provider]  Aspirin-Salicylamide-Caffeine (BC HEADACHE) 325-95-16 MG TABS Take 5 Packages by mouth daily at 6 (six) AM.   Yes [provider]  cyclobenzaprine (FLEXERIL) 10 MG tablet Take 10 mg by mouth 3 (three) times daily. 06/24/20  Yes  [provider]  lisinopril-hydrochlorothiazide (ZESTORETIC) 10-12.5 MG tablet Take 1 tablet by mouth daily. 07/12/19  Yes [provider]  metoprolol succinate (TOPROL-XL) 25 MG 24 hr tablet Take 25 mg by mouth daily. 04/29/20  Yes [provider]  Multiple Vitamins-Minerals (MULTIVITAMIN WITH MINERALS) tablet Take 1 tablet by mouth daily.   Yes [provider]  omeprazole (PRILOSEC) 20 MG capsule Take 20 mg by mouth daily.   Yes [provider]  oxyCODONE-acetaminophen (PERCOCET) 10-325 MG tablet Take 1 tablet by mouth 4 (four) times daily as needed for pain. 09/11/19  Yes [provider]  polyethylene glycol (MIRALAX / GLYCOLAX) 17 g packet Take 17 g by mouth daily.   Yes [provider]  albuterol (PROVENTIL HFA;VENTOLIN HFA) 108 (90 BASE) MCG/ACT inhaler Inhale 2 puffs into the lungs every 6 (six) hours as needed for wheezing or shortness of breath. 12/14/13   Ghimire, Henreitta Leber, MD  aspirin 81 MG chewable tablet Chew 1 tablet (81 mg total) by mouth daily. Patient not taking: Reported on 07/01/2020 12/14/13   Jonetta Osgood, MD  metoprolol tartrate (LOPRESSOR) 12.5 mg TABS tablet Take 1 tablet (25 mg total) by mouth 2 (two) times daily. Patient not taking: Reported on 07/01/2020 12/14/13   Jonetta Osgood, MD    [facility] 07/06/2020 3:56 PM

## 2020-07-06 NOTE — H&P (View-Only) (Signed)
NAME:  Cody Douglas, MRN:  [id number], DOB:  [birth year], LOS: 5 ADMISSION DATE:  06/30/2020, CONSULTATION DATE: 07/06/2020 REFERRING MD: Dr. Cathlean Sauer, CHIEF COMPLAINT: Lung mass  History of Present Illness:  This is a 70 year old gentleman past medical history of longstanding tobacco abuse since age 29 smoked until the day he was admitted to the hospital, history of CVA, hypertension prostate cancer.  Patient admitted to the hospital with complaints of hemoptysis.  Had a CT scan of the head with acute ischemic infarct and a right external capsule and caudate body CVA.  With the complaint of hemoptysis he underwent a CT scan of the chest CT of the chest on 07/05/2020 revealed a right hilar mass measuring 2.3 x 2.5 cm with an additional associated mass at the right mainstem bronchus and trachea at 3.9 x 4.3 cm.  Pertinent  Medical History   Past Medical History:  Diagnosis Date  . Hypertension   . Prostate cancer ([place])      Significant Hospital Events: Including procedures, antibiotic start and stop dates in addition to other pertinent events   . 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 rate 16, height _0  (1.803 m), weight 72.6 kg, SpO2 96 %.       No intake or output data in the 24 hours ending 07/06/20 1556 Filed Weights   07/05/20 0837  Weight: 72.6 kg    Examination: General: Chronically ill-appearing gentleman, resting in bed HENT: Tracking appropriately, dry mucous membranes Lungs: Diminished breath sounds bilaterally, bronchial breath sounds in the right Cardiovascular: Regular rhythm S1-S2 Abdomen: Soft nontender nondistended Extremities: No significant edema Neuro: Alert oriented following commands GU: Deferred  CT imaging 07/05/2020: RUL mass concerning for malignancy  I reviewed in the room with patient and his  daughter The patient's images have been independently reviewed by me.    Labs/imaging that I havepersonally reviewed  (right click and "Reselect all SmartList Selections" daily)  Hemoglobin 12.2 Sodium 137 Creatinine 0.56  Resolved Hospital Problem list     Assessment & Plan:   Right upper lobe hilar mass with associated adenopathy concerning for an advanced age bronchogenic carcinoma. Acute right ischemic CVA, external capsule, caudate body, holding dual antiplatelet therapy due to hemoptysis Acute hemoptysis secondary to malignancy concern as above. History of hypertension longstanding tobacco abuse Longstanding alcohol abuse Plan: Met with patient's daughter and patient at bedside. We discussed the risk benefits and alternatives of proceeding with video bronchoscopy with tissue biopsy. Patient has concern for advanced stage malignancy and will need mediastinal staging. We will plan for a combination navigation plus endobronchial ultrasound. However I do suspect he may have visible tumor within the right upper lobe takeoff and may not need the navigational support. Patient has been tentatively scheduled for 07/09/2020. Continue to hold Plavix, last dose Plavix was 07/04/2020. Also discussed the need for holding Plavix and risk of this with patient and patient's daughter.  They are agreeable to proceed.  Orders placed  I will see again Friday before procedure. N.p.o. midnight the night before.   Labs   CBC: Recent Labs  Lab 06/30/20 1905 06/30/20 1938 07/05/20 0325 07/06/20 0333  WBC 12.7*  --  9.9  --   NEUTROABS 10.2*  --  6.9  --   HGB 14.2 13.9 13.2 12.2*  HCT 43.1 41.0 40.4 37.2*  MCV 95.1  --  95.3  --   PLT 291  --  322  --     Basic Metabolic Panel: Recent Labs  Lab 06/30/20 1938 06/30/20 2200 07/05/20 0325  NA 136 134* 137  K 3.9 3.4* 3.8  CL 101 100 106  CO2  --  23 24  GLUCOSE 93 94 115*  BUN _0 CREATININE 0.50* 0.58* 0.56*  CALCIUM   --  9.0 9.1  MG  --  1.9  --    GFR: Estimated Creatinine Clearance: 87 mL/min (A) (by C-G formula based on SCr of 0.56 mg/dL (L)). Recent Labs  Lab 06/30/20 1905 07/05/20 0325  WBC 12.7* 9.9    Liver Function Tests: Recent Labs  Lab 06/30/20 2200  AST 118*  ALT 33  ALKPHOS 79  BILITOT 0.8  PROT 6.3*  ALBUMIN 3.4*   No results for input(s): LIPASE, AMYLASE in the last 168 hours. No results for input(s): AMMONIA in the last 168 hours.  ABG    Component Value Date/Time   TCO2 25 06/30/2020 1938     Coagulation Profile: No results for input(s): INR, PROTIME in the last 168 hours.  Cardiac Enzymes: Recent Labs  Lab 06/30/20 2200  CKTOTAL 6,158*    HbA1C: Hgb A1c MFr Bld  Date/Time Value Ref Range Status  07/01/2020 01:52 AM 6.3 (H) 4.8 - 5.6 % Final    Comment:    (NOTE) Pre diabetes:          5.7%-6.4%  Diabetes:              >6.4%  Glycemic control for   <7.0% adults with diabetes   12/13/2013 08:06 AM 5.7 (H) <5.7 % Final    Comment:    (NOTE)                                                                       According to the ADA Clinical Practice Recommendations for 2011, when HbA1c is used as a screening test:  >=6.5%   Diagnostic of Diabetes Mellitus           (if abnormal result is confirmed) 5.7-6.4%   Increased risk of developing Diabetes Mellitus References:Diagnosis and Classification of Diabetes Mellitus,Diabetes ZOXW,9604,54(UJWJX 1):S62-S69 and Standards of Medical Care in         Diabetes - 2011,Diabetes BJYN,8295,62 (Suppl 1):S11-S61.    CBG: No results for input(s): GLUCAP in the last 168 hours.  Review of Systems:   Review of Systems  Constitutional: Negative for chills, fever, malaise/fatigue and weight loss.  HENT: Negative for hearing loss, sore throat and tinnitus.   Eyes: Negative for blurred vision and double vision.  Respiratory: Positive for cough and shortness of breath. Negative for hemoptysis, sputum production,  wheezing and stridor.   Cardiovascular: Negative for chest pain, palpitations, orthopnea, leg swelling and PND.  Gastrointestinal: Negative for abdominal pain, constipation, diarrhea, heartburn, nausea and vomiting.  Genitourinary: Negative for dysuria, hematuria and urgency.  Musculoskeletal: Negative for joint pain and myalgias.  Skin: Negative for itching and rash.  Neurological: Negative for dizziness, tingling, weakness and headaches.  Endo/Heme/Allergies: Negative for environmental allergies. Does not bruise/bleed easily.  Psychiatric/Behavioral: Negative for depression. The patient is not nervous/anxious  and does not have insomnia.   All other systems reviewed and are negative.    Past Medical History:  He,  has a past medical history of Hypertension and Prostate cancer ([place]).   Surgical History:   Past Surgical History:  Procedure Laterality Date  . EYE SURGERY    . PROSTATE BIOPSY     Prostate surgery 8-9 yrs ago     Social History:   reports that he has been smoking cigarettes. He has a 100.00 pack-year smoking history. He does not have any smokeless tobacco history on file. He reports current alcohol use of about 1.0 standard drink of alcohol per week. He reports that he does not use drugs.   Family History:  His family history includes Cancer - Lung in his father; Heart disease in his brother. There is no history of Diabetes type II or Stroke.   Allergies No Known Allergies   Home Medications  Prior to Admission medications   Medication Sig Start Date End Date Taking? Authorizing Provider  ALPRAZolam Duanne Moron) 0.5 MG tablet Take 0.5 mg by mouth 3 (three) times daily. LF on 06-25-20 # 90 DS 30 06/25/20  Yes [provider]  Aspirin-Salicylamide-Caffeine (BC HEADACHE) 325-95-16 MG TABS Take 5 Packages by mouth daily at 6 (six) AM.   Yes [provider]  cyclobenzaprine (FLEXERIL) 10 MG tablet Take 10 mg by mouth 3 (three) times daily. 06/24/20  Yes  [provider]  lisinopril-hydrochlorothiazide (ZESTORETIC) 10-12.5 MG tablet Take 1 tablet by mouth daily. 07/12/19  Yes [provider]  metoprolol succinate (TOPROL-XL) 25 MG 24 hr tablet Take 25 mg by mouth daily. 04/29/20  Yes [provider]  Multiple Vitamins-Minerals (MULTIVITAMIN WITH MINERALS) tablet Take 1 tablet by mouth daily.   Yes [provider]  omeprazole (PRILOSEC) 20 MG capsule Take 20 mg by mouth daily.   Yes [provider]  oxyCODONE-acetaminophen (PERCOCET) 10-325 MG tablet Take 1 tablet by mouth 4 (four) times daily as needed for pain. 09/11/19  Yes [provider]  polyethylene glycol (MIRALAX / GLYCOLAX) 17 g packet Take 17 g by mouth daily.   Yes [provider]  albuterol (PROVENTIL HFA;VENTOLIN HFA) 108 (90 BASE) MCG/ACT inhaler Inhale 2 puffs into the lungs every 6 (six) hours as needed for wheezing or shortness of breath. 12/14/13   Ghimire, Henreitta Leber, MD  aspirin 81 MG chewable tablet Chew 1 tablet (81 mg total) by mouth daily. Patient not taking: Reported on 07/01/2020 12/14/13   Jonetta Osgood, MD  metoprolol tartrate (LOPRESSOR) 12.5 mg TABS tablet Take 1 tablet (25 mg total) by mouth 2 (two) times daily. Patient not taking: Reported on 07/01/2020 12/14/13   Jonetta Osgood, MD    [facility] 07/06/2020 3:56 PM

## 2020-07-06 NOTE — Progress Notes (Signed)
  Speech Language Pathology Treatment: Cognitive-Linquistic  Patient Details Name: Cody Douglas MRN: [id number] DOB: 1951/02/16 Today's Date: 07/06/2020 Time: 1445-1500 SLP Time Calculation (min) (ACUTE ONLY): 15 min  Assessment / Plan / Recommendation Clinical Impression  SLP followed up for cognitive intervention and attempted diet check. Pts daughter at bedside. Pt initially sitting upright in chair however leaning to the left substantially.  Despite moderate cues, pt with difficulty to self correct, even to hold head upright. Pt requested nurse tech assistance to bathroom. Provided moderate verbal cues for safety sequencing with transfers guided by nurse tech. Pt with poor safety awareness and difficulty sequencing despite cues. Pt continues to have difficulty with visual attention during ambulation tasks, assisted by nurse tech,and continues to require max cues to not run into objects in environment and remains significant fall risk. Pt with incontinence, nurse tech assisting with care. Defer diet check to next session. Pts daughter denies pt difficulty with lunch meal prior to SLP arrival. Will continue to follow.    HPI HPI: 70 y/o male presenting to the ED with slurred speech and facial droop. 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 PMH includes hypertrophic cardiomyopathy, hyponatremia, hypokalemia, chest pain, tobacco use, ETOH abuse,hypertension, prostate cancer. Hospitalization complicated with hemoptysis due to a NEW diagnosed right pulmonary hilar mass.      SLP Plan  Continue with current plan of care       Recommendations   SNF                Oral Care Recommendations: Oral care BID Follow up Recommendations: Skilled Nursing facility SLP Visit Diagnosis: Cognitive communication deficit (E36.629) Plan: Continue with current plan of care       GO                Hayden Rasmussen MA,  CCC-SLP Acute Rehabilitation Services   07/06/2020, 3:15 PM

## 2020-07-06 NOTE — Progress Notes (Signed)
Pulmonary consultation was requested, and patient will be scheduled for diagnostic bronchoscopy on April 29, considering hemoptysis patient will remain hospitalized for the study to be performed.

## 2020-07-06 NOTE — Progress Notes (Signed)
Occupational Therapy Treatment Patient Details Name: Cody Douglas MRN: [id number] DOB: [birth year] Today's Date: 07/06/2020    History of present illness Pt is a 70 y/o male presenting to the ED with slurred speech and facial droop. 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 Pt developed hemoptysis and CT revealed lung mass like lung CA. PMH includes hypertrophic cardiomyopathy, hyponatremia, hypokalemia, chest pain, tobacco use, ETOH abuse,hypertension, prostate cancer.   OT comments  Pt supine in bed and complaining of back pain.  Agreeable for OOB and repositioning to reduce discomfort, requires increased time and mod assist for bed mobility.  Mod assist for sit to stand, min assist to pivot to recliner using RW, cueing for hand placement and safety. Patient with flat affect and slow processing, decreased awareness of deficits and safety.  Noted L lateral lean once seated in recliner and requires cueing to correct positioning.  He fatigues easily and is limited by back pain this session.  Will follow acutely.    Follow Up Recommendations  SNF;Supervision/Assistance - 24 hour    Equipment Recommendations  3 in 1 bedside commode    Recommendations for Other Services      Precautions / Restrictions Precautions Precautions: Fall Precaution Comments: Reports hx of falls Restrictions Weight Bearing Restrictions: No       Mobility Bed Mobility Overal bed mobility: Needs Assistance Bed Mobility: Rolling;Sidelying to Sit Rolling: Min assist Sidelying to sit: Mod assist;HOB elevated       General bed mobility comments: cueing for technique for back pain, min assist to roll and mod assist to elevate trunk and scoot forward    Transfers Overall transfer level: Needs assistance Equipment used: Rolling walker (2 wheeled) Transfers: Sit to/from [facility] Sit to Stand: Mod assist Stand pivot  transfers: Min assist       General transfer comment: mod assist to power up and steady from EOB, cueing for hand placement and safety    Balance Overall balance assessment: Needs assistance Sitting-balance support: No upper extremity supported;Feet supported;Bilateral upper extremity supported Sitting balance-Leahy Scale: Poor Sitting balance - Comments: pt relies on UB support, limited by back pain; L lateral lean noted Postural control: Left lateral lean Standing balance support: Bilateral upper extremity supported;During functional activity Standing balance-Leahy Scale: Poor Standing balance comment: relies on UB and external support                           ADL either performed or assessed with clinical judgement   ADL Overall ADL's : Needs assistance/impaired Eating/Feeding: Set up;Sitting   Grooming: Set up;Sitting;Wash/dry hands;Wash/dry Sports administrator: Moderate assistance;Stand-pivot;RW Toilet Transfer Details (indicate cue type and reason): simulated to recliner         Functional mobility during ADLs: Minimal assistance;Moderate assistance;Rolling walker;Cueing for sequencing;Cueing for safety General ADL Comments: pt limited by weakness and back pain, noted L lateral lean once seated in recliner.  Limited session due to patient requesting to eat lunch and back pain.     Vision   Additional Comments: further assessment required   Perception     Praxis      Cognition Arousal/Alertness: Awake/alert Behavior During Therapy: Flat affect Overall Cognitive Status: Impaired/Different from baseline Area of Impairment: Attention;Awareness;Safety/judgement;Problem solving  Current Attention Level: Sustained Memory: Decreased short-term memory;Decreased recall of precautions   Safety/Judgement: Decreased awareness of safety;Decreased awareness of deficits Awareness: Emergent Problem Solving: Slow  processing;Requires verbal cues General Comments: patient with decreased safety awareness, slow processing and decreased STM.  He is able to follow simple commands but is easily distracted and internally distracted by back pain during session.        Exercises     Shoulder Instructions       General Comments daughter present and supportive    Pertinent Vitals/ Pain       Pain Assessment: Faces Faces Pain Scale: Hurts even more Pain Location: back Pain Descriptors / Indicators: Aching;Grimacing;Discomfort Pain Intervention(s): Limited activity within patient's tolerance;Monitored during session;Repositioned;Patient requesting pain meds-RN notified  Home Living                                          Prior Functioning/Environment              Frequency  Min 2X/week        Progress Toward Goals  OT Goals(current goals can now be found in the care plan section)  Progress towards OT goals: Not progressing toward goals - comment (back pain)  Acute Rehab OT Goals Patient Stated Goal: to get to rehab and get stronger OT Goal Formulation: With patient/family  Plan Discharge plan remains appropriate;Frequency remains appropriate    Co-evaluation                 AM-PAC OT "6 Clicks" Daily Activity     Outcome Measure   Help from another person eating meals?: None Help from another person taking care of personal grooming?: A Little Help from another person toileting, which includes using toliet, bedpan, or urinal?: A Lot Help from another person bathing (including washing, rinsing, drying)?: A Little Help from another person to put on and taking off regular upper body clothing?: A Little Help from another person to put on and taking off regular lower body clothing?: A Lot 6 Click Score: 17    End of Session Equipment Utilized During Treatment: Rolling walker  OT Visit Diagnosis: Other abnormalities of gait and mobility (R26.89);Repeated  falls (R29.6);Muscle weakness (generalized) (M62.81);Other symptoms and signs involving cognitive function;Pain Pain - part of body:  (back)   Activity Tolerance Patient limited by pain   Patient Left in chair;with call bell/phone within reach;with chair alarm set;with family/visitor present   Nurse Communication Mobility status        Time: 6767-2094 OT Time Calculation (min): 28 min  Charges: OT General Charges $OT Visit: 1 Visit OT Treatments $Self Care/Home Management : 8-22 mins $Therapeutic Activity: 8-22 mins  Jolaine Artist, OT Acute Rehabilitation Services Pager [phone] Office [phone]    Delight Stare 07/06/2020, 3:35 PM

## 2020-07-07 DIAGNOSIS — F1010 Alcohol abuse, uncomplicated: Secondary | ICD-10-CM

## 2020-07-07 DIAGNOSIS — I10 Essential (primary) hypertension: Secondary | ICD-10-CM | POA: Diagnosis not present

## 2020-07-07 LAB — HEMOGLOBIN AND HEMATOCRIT, BLOOD
HCT: 38.7 % — ABNORMAL LOW (ref 39.0–52.0)
Hemoglobin: 12.6 g/dL — ABNORMAL LOW (ref 13.0–17.0)

## 2020-07-07 NOTE — Plan of Care (Signed)
  Problem: Education: Goal: Knowledge of General Education information will improve Description: Including pain rating scale, medication(s)/side effects and non-pharmacologic comfort measures Outcome: Progressing   Problem: Health Behavior/Discharge Planning: Goal: Ability to manage health-related needs will improve Outcome: Progressing   Problem: Clinical Measurements: Goal: Ability to maintain clinical measurements within normal limits will improve Outcome: Progressing Goal: Will remain free from infection Outcome: Progressing Goal: Diagnostic test results will improve Outcome: Progressing Goal: Respiratory complications will improve Outcome: Progressing Goal: Cardiovascular complication will be avoided Outcome: Progressing   Problem: Activity: Goal: Risk for activity intolerance will decrease Outcome: Progressing   Problem: Nutrition: Goal: Adequate nutrition will be maintained Outcome: Progressing   Problem: Coping: Goal: Level of anxiety will decrease Outcome: Progressing   Problem: Elimination: Goal: Will not experience complications related to bowel motility Outcome: Progressing Goal: Will not experience complications related to urinary retention Outcome: Progressing   Problem: Pain Managment: Goal: General experience of comfort will improve Outcome: Progressing   Problem: Safety: Goal: Ability to remain free from injury will improve Outcome: Progressing   Problem: Skin Integrity: Goal: Risk for impaired skin integrity will decrease Outcome: Progressing   Problem: Education: Goal: Knowledge of disease or condition will improve Outcome: Progressing Goal: Knowledge of secondary prevention will improve Outcome: Progressing Goal: Knowledge of patient specific risk factors addressed and post discharge goals established will improve Outcome: Progressing   Problem: Coping: Goal: Will verbalize positive feelings about self Outcome: Progressing Goal: Will  identify appropriate support needs Outcome: Progressing   Problem: Self-Care: Goal: Ability to participate in self-care as condition permits will improve Outcome: Progressing   Problem: Nutrition: Goal: Risk of aspiration will decrease Outcome: Progressing

## 2020-07-07 NOTE — Progress Notes (Signed)
Physical Therapy Treatment Patient Details Name: Cody Douglas MRN: [id number] DOB: 1950/07/29 Today's Date: 07/07/2020    History of Present Illness Pt is a 70 y/o male presenting to the ED with slurred speech and facial droop. 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 Pt developed hemoptysis and CT revealed lung mass like lung CA. PMH includes hypertrophic cardiomyopathy, hyponatremia, hypokalemia, chest pain, tobacco use, ETOH abuse,hypertension, prostate cancer.    PT Comments    Patient limited by back pain this session requiring more assistance to rise from sitting EOB initially. Patient minA for ambulation with RW, constant cueing for maintaining RW proximity especially during turns. Continue to recommend SNF for ongoing Physical Therapy.       Follow Up Recommendations  SNF;Supervision/Assistance - 24 hour     Equipment Recommendations  Rolling Kimaya Whitlatch with 5" wheels;3in1 (PT)    Recommendations for Other Services       Precautions / Restrictions Precautions Precautions: Fall Precaution Comments: Reports hx of falls Restrictions Weight Bearing Restrictions: No    Mobility  Bed Mobility Overal bed mobility: Needs Assistance Bed Mobility: Supine to Sit;Sit to Supine     Supine to sit: Mod assist Sit to supine: Mod assist   General bed mobility comments: modA for LE management and truncal assist    Transfers Overall transfer level: Needs assistance Equipment used: Rolling Zohal Reny (2 wheeled) Transfers: Sit to/from Stand Sit to Stand: Max assist;Mod assist         General transfer comment: repeated attempts to stand but unsuccessful. On final attempt, maxA to rise. From low toilet, modA to stand. Cues for hand placement constantly  Ambulation/Gait Ambulation/Gait assistance: Min assist Gait Distance (Feet): 20 Feet Assistive device: Rolling Deantae Shackleton (2 wheeled) Gait Pattern/deviations:  Step-through pattern;Trunk flexed;Decreased step length - left Gait velocity: decreased   General Gait Details: MinA for balance and RW management. Cues for maintaining RW proximity especially on turns   Stairs             Wheelchair Mobility    Modified Rankin (Stroke Patients Only) Modified Rankin (Stroke Patients Only) Pre-Morbid Rankin Score: No significant disability Modified Rankin: Moderately severe disability     Balance Overall balance assessment: Needs assistance Sitting-balance support: Bilateral upper extremity supported;Feet supported Sitting balance-Leahy Scale: Poor Sitting balance - Comments: relies on UE support   Standing balance support: Bilateral upper extremity supported;During functional activity Standing balance-Leahy Scale: Poor Standing balance comment: relies on UE support and external assist                            Cognition Arousal/Alertness: Awake/alert Behavior During Therapy: Flat affect Overall Cognitive Status: Impaired/Different from baseline Area of Impairment: Attention;Awareness;Safety/judgement;Problem solving                   Current Attention Level: Sustained     Safety/Judgement: Decreased awareness of safety;Decreased awareness of deficits Awareness: Emergent Problem Solving: Slow processing;Requires verbal cues General Comments: decreased safety awareness and slow processing noted      Exercises      General Comments        Pertinent Vitals/Pain Pain Assessment: Faces Faces Pain Scale: Hurts even more Pain Location: back Pain Descriptors / Indicators: Aching;Grimacing;Discomfort Pain Intervention(s): Monitored during session;Repositioned    Home Living  Prior Function            PT Goals (current goals can now be found in the care plan section) Acute Rehab PT Goals Patient Stated Goal: none stated PT Goal Formulation: With patient Time For Goal  Achievement: 07/15/20 Potential to Achieve Goals: Fair Progress towards PT goals: Progressing toward goals    Frequency    Min 3X/week      PT Plan Current plan remains appropriate    Co-evaluation              AM-PAC PT "6 Clicks" Mobility   Outcome Measure  Help needed turning from your back to your side while in a flat bed without using bedrails?: A Lot Help needed moving from lying on your back to sitting on the side of a flat bed without using bedrails?: A Lot Help needed moving to and from a bed to a chair (including a wheelchair)?: A Lot Help needed standing up from a chair using your arms (e.g., wheelchair or bedside chair)?: A Lot Help needed to walk in hospital room?: A Little Help needed climbing 3-5 steps with a railing? : A Lot 6 Click Score: 13    End of Session Equipment Utilized During Treatment: Gait belt Activity Tolerance: Patient tolerated treatment well Patient left: in bed;with call bell/phone within reach;with bed alarm set Nurse Communication: Mobility status PT Visit Diagnosis: Unsteadiness on feet (R26.81);Muscle weakness (generalized) (M62.81);History of falling (Z91.81)     Time: 1324-4010 PT Time Calculation (min) (ACUTE ONLY): 25 min  Charges:  $Therapeutic Activity: 23-37 mins                     Stclair Szymborski A. Gilford Rile PT, DPT Acute Rehabilitation Services Pager [phone] Office [phone]    Linna Hoff 07/07/2020, 2:30 PM

## 2020-07-07 NOTE — Progress Notes (Signed)
PROGRESS NOTE  Cody Douglas NID:[id number] DOB: 12-11-1950 DOA: 06/30/2020 PCP: Tamsen Roers, MD   LOS: 6 days   Brief Narrative / Interim history: 70 year old male with hypertrophic cardiomyopathy, alcohol and tobacco use, HTN, prostate cancer who came into the hospital with slurred speech and facial droop.  Apparently he continues to drink alcohol.  In the ED underwent brain MRI which showed acute infarction in the external capsule and caudate body on the right without mass-effect or hemorrhage.  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  Subjective / 24h Interval events: Doing well this morning, still complains of left hand weakness but better than on admission.  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  He was initially placed on aspirin and Plavix but this has been held due to hemoptysis.  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  Lipid panel showed an LDL of 115, he is on statin.  A1c is 6.3.  SNF has been recommended -Neurology recommended aspirin 325 and Plavix 75 for 3 months then Plavix alone given large vessel high-grade stenosis but now on hold  Active Problems New right hilar mass, complicated with hemoptysis and acute blood loss anemia -hemoptysis has now resolved as he is off antiplatelet therapy.  Pulmonary consulted and following, plans for bronchoscopy on 4/29.  Pending that study we will check with pulmonary when aspirin and Plavix could be resumed but probably need to wait at least 7 days from 4/25  Concern for endocarditis /mitral valve vegetations-based on the TTE, TEE has been attempted but patient developed hemoptysis and had to be  canceled.  Seems to be nonbacterial given lack of WBC, afebrile.  Essential hypertension -continue metoprolol  Tobacco and alcohol abuse -counseled for cessation  Leukocytosis-resolved  Chronic pain-continue as needed oxycodone and Flexeril  Scheduled Meds: .  stroke: mapping our early stages of recovery book   Does not apply Once  . ALPRAZolam  0.5 mg Oral TID  . atorvastatin  40 mg Oral Daily  . cyclobenzaprine  5 mg Oral TID  . folic acid  1 mg Oral Daily  . metoprolol succinate  25 mg Oral Daily  . multivitamin  1 tablet Oral Daily  . pantoprazole  40 mg Oral Daily  . polyethylene glycol  17 g Oral Daily  . thiamine  100 mg Oral Daily   Continuous Infusions: PRN Meds:.oxyCODONE **AND** acetaminophen, acetaminophen **OR** [DISCONTINUED] acetaminophen (TYLENOL) oral liquid 160 mg/5 mL **OR** [DISCONTINUED] acetaminophen, albuterol, guaiFENesin-dextromethorphan, senna-docusate  Diet Orders (From admission, onward)    Start     Ordered   07/09/20 0001  Diet NPO time specified  Diet effective midnight        07/06/20 1624   07/05/20 1401  Diet Heart Room service appropriate? Yes; Fluid consistency: Thin  Diet effective now       Question Answer Comment  Room service appropriate? Yes   Fluid consistency: Thin      07/05/20 1401          DVT prophylaxis:      Code Status: Full Code  Family Communication: no family at bedside   Status is: Inpatient  Remains inpatient appropriate because:Inpatient level of care appropriate due to severity  of illness   Dispo: The patient is from: Home              Anticipated d/c is to: SNF              Patient currently is not medically stable to d/c.   Difficult to place patient No  Level of care: Telemetry Medical  Consultants:  Pulmonary Neurology   Procedures:  2D echo:  1. Left ventricular ejection fraction, by estimation, is 55 to 60%. The left ventricle has normal function. The left ventricle has no regional wall  motion abnormalities. Left ventricular diastolic parameters are consistent with Grade I diastolic  dysfunction (impaired relaxation).  2. Right ventricular systolic function is normal. The right ventricular size is normal. There is normal pulmonary artery systolic pressure.  3. Severe calcified bulky lesions on posterior and anterior leaflets Despite calcification edges/borders seem irregular and favor vegetations rather than MAC Suggest TEE to further evaluate. The mitral valve is abnormal. Mild mitral valve regurgitation. No evidence of mitral stenosis.  4. Severe bulky calcification/ mass particularly on the Non coronary cusp and commisure between the right and non cusps. Again despite calcification edges irregular and suggest vegetations TEE and cardiology consult suggested . The aortic valve is abnormal. Aortic valve regurgitation is mild. No aortic stenosis is  present.  5. The inferior vena cava is normal in size with greater than 50% respiratory variability, suggesting right atrial pressure of 3 mmHg.   Microbiology  none  Antimicrobials: None     Objective: Vitals:   07/06/20 2059 07/07/20 0003 07/07/20 0422 07/07/20 0755  BP: 112/70 105/62 111/65 118/64  Pulse: 76 77 78 80  Resp: _0 Temp: 98.1 F (36.7 C) 98.9 F (37.2 C) 97.8 F (36.6 C) 97.7 F (36.5 C)  TempSrc: Oral Oral Oral Oral  SpO2: 94% 93% 94% 94%  Weight:      Height:        Intake/Output Summary (Last 24 hours) at 07/07/2020 0847 Last data filed at 07/07/2020 0422 Gross per 24 hour  Intake --  Output 150 ml  Net -150 ml   Filed Weights   07/05/20 0837  Weight: 72.6 kg    Examination:  Constitutional: NAD Eyes: no scleral icterus ENMT: Mucous membranes are moist.  Neck: normal, supple Respiratory: Diminished at the bases but overall clear without wheezing Cardiovascular: Regular rate and rhythm, no murmurs / rubs / gallops. No LE edema. Abdomen: non distended, no tenderness. Bowel  sounds positive.  Musculoskeletal: no clubbing / cyanosis.  Skin: no rashes Neurologic: CN 2-12 grossly intact.  Mild left-sided weakness Psychiatric: Normal judgment and insight. Alert and oriented x 3. Normal mood.   Data Reviewed: I have independently reviewed following labs and imaging studies   CBC: Recent Labs  Lab 06/30/20 1905 06/30/20 1938 07/05/20 0325 07/06/20 0333 07/07/20 0224  WBC 12.7*  --  9.9  --   --   NEUTROABS 10.2*  --  6.9  --   --   HGB 14.2 13.9 13.2 12.2* 12.6*  HCT 43.1 41.0 40.4 37.2* 38.7*  MCV 95.1  --  95.3  --   --   PLT 291  --  322  --   --    Basic Metabolic Panel: Recent Labs  Lab 06/30/20 1938 06/30/20 2200 07/05/20 0325  NA 136 134* 137  K 3.9 3.4* 3.8  CL 101 100 106  CO2  --  23 24  GLUCOSE 93 94 115*  BUN _0 CREATININE 0.50* 0.58* 0.56*  CALCIUM  --  9.0 9.1  MG  --  1.9  --    Liver Function Tests: Recent Labs  Lab 06/30/20 2200  AST 118*  ALT 33  ALKPHOS 79  BILITOT 0.8  PROT 6.3*  ALBUMIN 3.4*   Coagulation Profile: No results for input(s): INR, PROTIME in the last 168 hours. HbA1C: No results for input(s): HGBA1C in the last 72 hours. CBG: No results for input(s): GLUCAP in the last 168 hours.  Recent Results (from the past 240 hour(s))  Resp Panel by RT-PCR (Flu A&B, Covid) Nasopharyngeal Swab     Status: None   Collection Time: 06/30/20  7:31 PM   Specimen: Nasopharyngeal Swab; Nasopharyngeal(NP) swabs in vial transport medium  Result Value Ref Range Status   SARS Coronavirus 2 by RT PCR NEGATIVE NEGATIVE Final    Comment: (NOTE) SARS-CoV-2 target nucleic acids are NOT DETECTED.  The SARS-CoV-2 RNA is generally detectable in upper respiratory specimens during the acute phase of infection. The lowest concentration of SARS-CoV-2 viral copies this assay can detect is 138 copies/mL. A negative result does not preclude SARS-Cov-2 infection and should not be used as the sole basis for treatment  or other patient management decisions. A negative result may occur with  improper specimen collection/handling, submission of specimen other than nasopharyngeal swab, presence of viral mutation(s) within the areas targeted by this assay, and inadequate number of viral copies(<138 copies/mL). A negative result must be combined with clinical observations, patient history, and epidemiological information. The expected result is Negative.  Fact Sheet for Patients:  EntrepreneurPulse.com.au  Fact Sheet for Healthcare Providers:  IncredibleEmployment.be  This test is no t yet approved or cleared by the [place] FDA and  has been authorized for detection and/or diagnosis of SARS-CoV-2 by FDA under an Emergency Use Authorization (EUA). This EUA will remain  in effect (meaning this test can be used) for the duration of the COVID-19 declaration under Section 564(b)(1) of the Act, 21 U.S.C.section 360bbb-3(b)(1), unless the authorization is terminated  or revoked sooner.       Influenza A by PCR NEGATIVE NEGATIVE Final   Influenza B by PCR NEGATIVE NEGATIVE Final    Comment: (NOTE) The Xpert Xpress SARS-CoV-2/FLU/RSV plus assay is intended as an aid in the diagnosis of influenza from Nasopharyngeal swab specimens and should not be used as a sole basis for treatment. Nasal washings and aspirates are unacceptable for Xpert Xpress SARS-CoV-2/FLU/RSV testing.  Fact Sheet for Patients: EntrepreneurPulse.com.au  Fact Sheet for Healthcare Providers: IncredibleEmployment.be  This test is not yet approved or cleared by the [place] FDA and has been authorized for detection and/or diagnosis of SARS-CoV-2 by FDA under an Emergency Use Authorization (EUA). This EUA will remain in effect (meaning this test can be used) for the duration of the COVID-19 declaration under Section 564(b)(1) of the Act, 21 U.S.C. section  360bbb-3(b)(1), unless the authorization is terminated or revoked.  Performed at [facility], [facility] [place], [place] [address]      Radiology Studies: No results found.  Marzetta Board, MD, PhD [facility]  Between 7 am - 7 pm I am available, please contact me via Amion (for emergencies) or Securechat (non urgent messages)  Between 7 pm - 7 am I am not available, please contact night coverage MD/APP via Amion

## 2020-07-08 DIAGNOSIS — I10 Essential (primary) hypertension: Secondary | ICD-10-CM | POA: Diagnosis not present

## 2020-07-08 DIAGNOSIS — F1010 Alcohol abuse, uncomplicated: Secondary | ICD-10-CM | POA: Diagnosis not present

## 2020-07-08 LAB — COMPREHENSIVE METABOLIC PANEL
ALT: 17 U/L (ref 0–44)
AST: 18 U/L (ref 15–41)
Albumin: 3.2 g/dL — ABNORMAL LOW (ref 3.5–5.0)
Alkaline Phosphatase: 84 U/L (ref 38–126)
Anion gap: 8 (ref 5–15)
BUN: 14 mg/dL (ref 8–23)
CO2: 22 mmol/L (ref 22–32)
Calcium: 8.8 mg/dL — ABNORMAL LOW (ref 8.9–10.3)
Chloride: 103 mmol/L (ref 98–111)
Creatinine, Ser: 0.57 mg/dL — ABNORMAL LOW (ref 0.61–1.24)
GFR, Estimated: >60 mL/min (ref >60)
Glucose, Bld: 122 mg/dL — ABNORMAL HIGH (ref 70–99)
Potassium: 3.8 mmol/L (ref 3.5–5.1)
Sodium: 133 mmol/L — ABNORMAL LOW (ref 135–145)
Total Bilirubin: 0.6 mg/dL (ref 0.3–1.2)
Total Protein: 6.2 g/dL — ABNORMAL LOW (ref 6.5–8.1)

## 2020-07-08 LAB — CBC
HCT: 39.1 % (ref 39.0–52.0)
Hemoglobin: 13 g/dL (ref 13.0–17.0)
MCH: 31.2 pg (ref 26.0–34.0)
MCHC: 33.2 g/dL (ref 30.0–36.0)
MCV: 93.8 fL (ref 80.0–100.0)
Platelets: 322 10*3/uL (ref 150–400)
RBC: 4.17 MIL/uL — ABNORMAL LOW (ref 4.22–5.81)
RDW: 12.1 % (ref 11.5–15.5)
WBC: 10.3 10*3/uL (ref 4.0–10.5)
nRBC: 0 % (ref 0.0–0.2)

## 2020-07-08 NOTE — Progress Notes (Signed)
PROGRESS NOTE  Cody Douglas FUX:[id number] DOB: July 07, 1950 DOA: 06/30/2020 PCP: Tamsen Roers, MD   LOS: 7 days   Brief Narrative / Interim history: 70 year old male with hypertrophic cardiomyopathy, alcohol and tobacco use, HTN, prostate cancer who came into the hospital with slurred speech and facial droop.  Apparently he continues to drink alcohol.  In the ED underwent brain MRI which showed acute infarction in the external capsule and caudate body on the right without mass-effect or hemorrhage.  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  He was initially placed on aspirin and Plavix but this has been held due to hemoptysis.  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  Lipid panel showed an LDL of 115, he is on statin.  A1c is 6.3.  SNF has been recommended -Neurology recommended aspirin 325 and Plavix 75 for 3 months then Plavix alone given large vessel high-grade stenosis but now on hold -Discussed with pulmonary, they are okay with baby aspirin, will start after bronc  Active Problems New right hilar mass, complicated with hemoptysis and acute blood loss anemia -hemoptysis has now resolved as he is off antiplatelet therapy.  Pulmonary consulted and following, plans for bronchoscopy on 4/29.  Start baby aspirin after bronc  Concern for endocarditis /mitral valve vegetations-based on the TTE, TEE has been attempted but patient developed hemoptysis and had to be canceled.  Seems to be nonbacterial given lack of WBC, afebrile.  Reconsulted cardiology to see  if a TEE can now be done given resolution of his hemoptysis   Essential hypertension -continue metoprolol  Tobacco and alcohol abuse -counseled for cessation  Leukocytosis-resolved  Chronic pain-continue as needed oxycodone and Flexeril  Scheduled Meds: .  stroke: mapping our early stages of recovery book   Does not apply Once  . ALPRAZolam  0.5 mg Oral TID  . atorvastatin  40 mg Oral Daily  . cyclobenzaprine  5 mg Oral TID  . folic acid  1 mg Oral Daily  . metoprolol succinate  25 mg Oral Daily  . multivitamin  1 tablet Oral Daily  . pantoprazole  40 mg Oral Daily  . polyethylene glycol  17 g Oral Daily  . thiamine  100 mg Oral Daily   Continuous Infusions: PRN Meds:.oxyCODONE **AND** acetaminophen, acetaminophen **OR** [DISCONTINUED] acetaminophen (TYLENOL) oral liquid 160 mg/5 mL **OR** [DISCONTINUED] acetaminophen, albuterol, guaiFENesin-dextromethorphan, senna-docusate  Diet Orders (From admission, onward)    Start     Ordered   07/09/20 0001  Diet NPO time specified  Diet effective midnight        07/06/20 1624   07/05/20 1401  Diet Heart Room service appropriate? Yes; Fluid consistency: Thin  Diet effective now       Question Answer Comment  Room service appropriate? Yes   Fluid consistency: Thin      07/05/20 1401          DVT prophylaxis:      Code Status: Full Code  Family Communication: no family at bedside   Status is: Inpatient  Remains inpatient appropriate because:Inpatient level of care appropriate due to severity of illness  Dispo: The patient is from: Home              Anticipated d/c is to: SNF              Patient currently is not medically stable to d/c.   Difficult to place patient No  Level of care: Telemetry Medical  Consultants:  Pulmonary Neurology   Procedures:  2D echo:  1. Left ventricular ejection fraction, by estimation, is 55 to 60%. The left ventricle has normal function. The left ventricle has no regional wall motion  abnormalities. Left ventricular diastolic parameters are consistent with Grade I diastolic  dysfunction (impaired relaxation).  2. Right ventricular systolic function is normal. The right ventricular size is normal. There is normal pulmonary artery systolic pressure.  3. Severe calcified bulky lesions on posterior and anterior leaflets Despite calcification edges/borders seem irregular and favor vegetations rather than MAC Suggest TEE to further evaluate. The mitral valve is abnormal. Mild mitral valve regurgitation. No evidence of mitral stenosis.  4. Severe bulky calcification/ mass particularly on the Non coronary cusp and commisure between the right and non cusps. Again despite calcification edges irregular and suggest vegetations TEE and cardiology consult suggested . The aortic valve is abnormal. Aortic valve regurgitation is mild. No aortic stenosis is  present.  5. The inferior vena cava is normal in size with greater than 50% respiratory variability, suggesting right atrial pressure of 3 mmHg.   Microbiology  none  Antimicrobials: None     Objective: Vitals:   07/07/20 1944 07/07/20 2318 07/08/20 0335 07/08/20 0725  BP: 101/66 107/62 114/69 (!) 112/55  Pulse: 82 78 78 79  Resp: _0 Temp: 98.8 F (37.1 C) 98.5 F (36.9 C) 97.6 F (36.4 C) 97.6 F (36.4 C)  TempSrc: Oral Oral Oral Oral  SpO2: 93% 93% 97% 93%  Weight:      Height:        Intake/Output Summary (Last 24 hours) at 07/08/2020 1046 Last data filed at 07/08/2020 0900 Gross per 24 hour  Intake 240 ml  Output 350 ml  Net -110 ml   Filed Weights   07/05/20 0837  Weight: 72.6 kg    Examination:  Constitutional: He is in no distress, in bed Eyes: No icterus ENMT: mmm Neck: normal, supple Respiratory: Diminished at the bases but overall clear, no wheezing Cardiovascular: Regular rate and rhythm, no murmurs, no edema Abdomen: Soft, NT, ND, bowel sounds positive Musculoskeletal: no clubbing /  cyanosis.  Skin: No rashes seen Neurologic: Mild left-sided weakness, no other focal deficits   Data Reviewed: I have independently reviewed following labs and imaging studies   CBC: Recent Labs  Lab 07/05/20 0325 07/06/20 0333 07/07/20 0224 07/08/20 0253  WBC 9.9  --   --  10.3  NEUTROABS 6.9  --   --   --   HGB 13.2 12.2* 12.6* 13.0  HCT 40.4 37.2* 38.7* 39.1  MCV 95.3  --   --  93.8  PLT 322  --   --  505   Basic Metabolic Panel: Recent Labs  Lab 07/05/20 0325 07/08/20 0253  NA 137 133*  K 3.8 3.8  CL 106 103  CO2 24 22  GLUCOSE 115* 122*  BUN 14 14  CREATININE 0.56* 0.57*  CALCIUM 9.1 8.8*   Liver Function Tests: Recent Labs  Lab 07/08/20 0253  AST 18  ALT 17  ALKPHOS 84  BILITOT 0.6  PROT 6.2*  ALBUMIN 3.2*  Coagulation Profile: No results for input(s): INR, PROTIME in the last 168 hours. HbA1C: No results for input(s): HGBA1C in the last 72 hours. CBG: No results for input(s): GLUCAP in the last 168 hours.  Recent Results (from the past 240 hour(s))  Resp Panel by RT-PCR (Flu A&B, Covid) Nasopharyngeal Swab     Status: None   Collection Time: 06/30/20  7:31 PM   Specimen: Nasopharyngeal Swab; Nasopharyngeal(NP) swabs in vial transport medium  Result Value Ref Range Status   SARS Coronavirus 2 by RT PCR NEGATIVE NEGATIVE Final    Comment: (NOTE) SARS-CoV-2 target nucleic acids are NOT DETECTED.  The SARS-CoV-2 RNA is generally detectable in upper respiratory specimens during the acute phase of infection. The lowest concentration of SARS-CoV-2 viral copies this assay can detect is 138 copies/mL. A negative result does not preclude SARS-Cov-2 infection and should not be used as the sole basis for treatment or other patient management decisions. A negative result may occur with  improper specimen collection/handling, submission of specimen other than nasopharyngeal swab, presence of viral mutation(s) within the areas targeted by this assay, and  inadequate number of viral copies(<138 copies/mL). A negative result must be combined with clinical observations, patient history, and epidemiological information. The expected result is Negative.  Fact Sheet for Patients:  EntrepreneurPulse.com.au  Fact Sheet for Healthcare Providers:  IncredibleEmployment.be  This test is no t yet approved or cleared by the [place] FDA and  has been authorized for detection and/or diagnosis of SARS-CoV-2 by FDA under an Emergency Use Authorization (EUA). This EUA will remain  in effect (meaning this test can be used) for the duration of the COVID-19 declaration under Section 564(b)(1) of the Act, 21 U.S.C.section 360bbb-3(b)(1), unless the authorization is terminated  or revoked sooner.       Influenza A by PCR NEGATIVE NEGATIVE Final   Influenza B by PCR NEGATIVE NEGATIVE Final    Comment: (NOTE) The Xpert Xpress SARS-CoV-2/FLU/RSV plus assay is intended as an aid in the diagnosis of influenza from Nasopharyngeal swab specimens and should not be used as a sole basis for treatment. Nasal washings and aspirates are unacceptable for Xpert Xpress SARS-CoV-2/FLU/RSV testing.  Fact Sheet for Patients: EntrepreneurPulse.com.au  Fact Sheet for Healthcare Providers: IncredibleEmployment.be  This test is not yet approved or cleared by the [place] FDA and has been authorized for detection and/or diagnosis of SARS-CoV-2 by FDA under an Emergency Use Authorization (EUA). This EUA will remain in effect (meaning this test can be used) for the duration of the COVID-19 declaration under Section 564(b)(1) of the Act, 21 U.S.C. section 360bbb-3(b)(1), unless the authorization is terminated or revoked.  Performed at [facility], [place] [address], [place], [place] [address]      Radiology Studies: No results found.  Marzetta Board, MD, PhD [facility]  Between 7 am - 7 pm I am available, please contact me via Amion (for emergencies) or Securechat (non urgent messages)  Between 7 pm - 7 am I am not available, please contact night coverage MD/APP via Amion

## 2020-07-08 NOTE — Plan of Care (Signed)
  Problem: Education: Goal: Knowledge of General Education information will improve Description: Including pain rating scale, medication(s)/side effects and non-pharmacologic comfort measures Outcome: Progressing   Problem: Health Behavior/Discharge Planning: Goal: Ability to manage health-related needs will improve Outcome: Progressing   Problem: Clinical Measurements: Goal: Ability to maintain clinical measurements within normal limits will improve Outcome: Progressing Goal: Will remain free from infection Outcome: Progressing Goal: Diagnostic test results will improve Outcome: Progressing Goal: Respiratory complications will improve Outcome: Progressing Goal: Cardiovascular complication will be avoided Outcome: Progressing   Problem: Activity: Goal: Risk for activity intolerance will decrease Outcome: Progressing   Problem: Nutrition: Goal: Adequate nutrition will be maintained Outcome: Progressing   Problem: Coping: Goal: Level of anxiety will decrease Outcome: Progressing   Problem: Elimination: Goal: Will not experience complications related to bowel motility Outcome: Progressing Goal: Will not experience complications related to urinary retention Outcome: Progressing   Problem: Pain Managment: Goal: General experience of comfort will improve Outcome: Progressing   Problem: Safety: Goal: Ability to remain free from injury will improve Outcome: Progressing   Problem: Skin Integrity: Goal: Risk for impaired skin integrity will decrease Outcome: Progressing   Problem: Education: Goal: Knowledge of disease or condition will improve Outcome: Progressing Goal: Knowledge of secondary prevention will improve Outcome: Progressing Goal: Knowledge of patient specific risk factors addressed and post discharge goals established will improve Outcome: Progressing   Problem: Coping: Goal: Will verbalize positive feelings about self Outcome: Progressing Goal: Will  identify appropriate support needs Outcome: Progressing   Problem: Self-Care: Goal: Ability to participate in self-care as condition permits will improve Outcome: Progressing   Problem: Nutrition: Goal: Risk of aspiration will decrease Outcome: Progressing

## 2020-07-08 NOTE — Progress Notes (Signed)
Occupational Therapy Treatment Patient Details Name: Cody Douglas MRN: [id number] DOB: 1950/07/18 Today's Date: 07/08/2020    History of present illness Pt is a 70 y/o male presenting to the ED with slurred speech and facial droop. 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 Pt developed hemoptysis and CT revealed lung mass like lung CA. PMH includes hypertrophic cardiomyopathy, hyponatremia, hypokalemia, chest pain, tobacco use, ETOH abuse,hypertension, prostate cancer.   OT comments  Pt demonstrates increased difficulty with mobility and ability to complete ADL tasks since evaluation. Mod A with bed mobility and all functional mobility @ RW level. Requires physical cues to maintain position inside of RW. Max A with LB ADL tasks. Mom and daughter present during session. Recommend pt mobilize OOB to recliner with staff multitple times/day due to back pain. May benefit from use of [place] - nsg notified. Continue to recommend rehab at SNF to maximize functional level of independence with ADL and mobility.  Will continue to follow acutely.   Follow Up Recommendations  SNF;Supervision/Assistance - 24 hour    Equipment Recommendations  3 in 1 bedside commode    Recommendations for Other Services      Precautions / Restrictions Precautions Precautions: Fall Precaution Comments: Reports hx of falls       Mobility Bed Mobility Overal bed mobility: Needs Assistance Bed Mobility: Supine to Sit;Sit to Supine     Supine to sit: Mod assist Sit to supine: Mod assist   General bed mobility comments: mod vc and physical assist    Transfers Overall transfer level: Needs assistance Equipment used: Rolling walker (2 wheeled) Transfers: Sit to/from Stand Sit to Stand: Mod assist -pt trying to pull up on RW with RW leaning posteriorly and pt unaware              Balance Overall balance assessment: Needs  assistance Sitting-balance support: Bilateral upper extremity supported;Feet supported Sitting balance-Leahy Scale: Poor Sitting balance - Comments: relies on UE support   Standing balance support: Bilateral upper extremity supported;During functional activity Standing balance-Leahy Scale: Poor Standing balance comment: relies on UE support and external assist                           ADL either performed or assessed with clinical judgement   ADL Overall ADL's : Needs assistance/impaired Eating/Feeding: Set up;Sitting   Grooming: Set up;Sitting   Upper Body Bathing: Set up;Sitting               Toilet Transfer: Moderate assistance;RW;Ambulation;Comfort height toilet;Grab bars   Toileting- Clothing Manipulation and Hygiene: Maximal assistance Toileting - Clothing Manipulation Details (indicate cue type and reason): sit - stand. Unable to release RW     Functional mobility during ADLs: Moderate assistance;Rolling walker;Cueing for safety;Cueing for sequencing General ADL Comments: poor orientatino of self in RW; Pushing RW toward R and body towards L     Vision   Additional Comments: will further assess   Perception     Praxis      Cognition Arousal/Alertness: Awake/alert Behavior During Therapy: Flat affect Overall Cognitive Status: Impaired/Different from baseline Area of Impairment: Attention;Awareness;Safety/judgement;Problem solving                   Current Attention Level: Sustained Memory: Decreased short-term memory   Safety/Judgement: Decreased awareness of safety;Decreased awareness of deficits Awareness: Emergent Problem Solving: Slow processing;Requires verbal cues  Exercises     Shoulder Instructions       General Comments Mom and daughter present for session    Pertinent Vitals/ Pain       Pain Assessment: Faces Faces Pain Scale: Hurts even more Pain Location: back Pain Descriptors / Indicators:  Aching;Grimacing;Discomfort Pain Intervention(s): Limited activity within patient's tolerance;Premedicated before session  Home Living                                          Prior Functioning/Environment              Frequency  Min 2X/week        Progress Toward Goals  OT Goals(current goals can now be found in the care plan section)  Progress towards OT goals: Progressing toward goals  Acute Rehab OT Goals Patient Stated Goal: to go to the bathroom OT Goal Formulation: With patient/family Time For Goal Achievement: 07/15/20 Potential to Achieve Goals: Good ADL Goals Pt Will Perform Lower Body Bathing: with modified independence;sit to/from stand Pt Will Perform Lower Body Dressing: with modified independence;sit to/from stand Pt Will Transfer to Toilet: with modified independence;ambulating Additional ADL Goal #1: Pt will demonstrate ability to manage his medication list with S Additional ADL Goal #2: Pt will verbalize 3 strategiges to reduce risk of falls  Plan Discharge plan remains appropriate    Co-evaluation                 AM-PAC OT "6 Clicks" Daily Activity     Outcome Measure   Help from another person eating meals?: None Help from another person taking care of personal grooming?: A Little Help from another person toileting, which includes using toliet, bedpan, or urinal?: A Lot Help from another person bathing (including washing, rinsing, drying)?: A Lot Help from another person to put on and taking off regular upper body clothing?: A Little Help from another person to put on and taking off regular lower body clothing?: A Lot 6 Click Score: 16    End of Session Equipment Utilized During Treatment: Rolling walker  OT Visit Diagnosis: Other abnormalities of gait and mobility (R26.89);Unsteadiness on feet (R26.81);Muscle weakness (generalized) (M62.81);Other symptoms and signs involving cognitive function;Pain Pain - part of  body:  (back)   Activity Tolerance Patient tolerated treatment well   Patient Left in chair;with call bell/phone within reach;with chair alarm set;with family/visitor present   Nurse Communication Mobility status        Time: 1435-1500 OT Time Calculation (min): 25 min  Charges: OT General Charges $OT Visit: 1 Visit OT Treatments $Self Care/Home Management : 23-37 mins  Maurie Boettcher, OT/L   Acute OT Clinical Specialist [place] Pager [phone] Office [phone]    [facility] 07/08/2020, 3:10 PM

## 2020-07-09 ENCOUNTER — Inpatient Hospital Stay (HOSPITAL_COMMUNITY): Payer: Medicare Other | Admitting: Certified Registered Nurse Anesthetist

## 2020-07-09 ENCOUNTER — Inpatient Hospital Stay (HOSPITAL_COMMUNITY): Payer: Medicare Other

## 2020-07-09 ENCOUNTER — Encounter (HOSPITAL_COMMUNITY): Payer: Self-pay | Admitting: Internal Medicine

## 2020-07-09 ENCOUNTER — Encounter (HOSPITAL_COMMUNITY)
Admission: EM | Disposition: A | Discharge status: Skilled Nursing Facility | Payer: Self-pay | Source: Home / Self Care | Attending: Internal Medicine

## 2020-07-09 DIAGNOSIS — R918 Other nonspecific abnormal finding of lung field: Secondary | ICD-10-CM | POA: Diagnosis not present

## 2020-07-09 DIAGNOSIS — I10 Essential (primary) hypertension: Secondary | ICD-10-CM | POA: Diagnosis not present

## 2020-07-09 DIAGNOSIS — F1010 Alcohol abuse, uncomplicated: Secondary | ICD-10-CM | POA: Diagnosis not present

## 2020-07-09 HISTORY — PX: BRONCHIAL BRUSHINGS: SHX5108

## 2020-07-09 HISTORY — PX: CRYOTHERAPY: SHX6894

## 2020-07-09 HISTORY — PX: BRONCHIAL WASHINGS: SHX5105

## 2020-07-09 HISTORY — PX: HEMOSTASIS CONTROL: SHX6838

## 2020-07-09 HISTORY — PX: VIDEO BRONCHOSCOPY WITH ENDOBRONCHIAL ULTRASOUND: SHX6177

## 2020-07-09 SURGERY — BRONCHOSCOPY, WITH EBUS
Anesthesia: General | Laterality: Bilateral

## 2020-07-09 MED ORDER — LIDOCAINE 2% (20 MG/ML) 5 ML SYRINGE
INTRAMUSCULAR | Status: DC | PRN
  Administered 2020-07-09: 60 mg via INTRAVENOUS

## 2020-07-09 MED ORDER — SUGAMMADEX SODIUM 200 MG/2ML IV SOLN
INTRAVENOUS | Status: DC | PRN
  Administered 2020-07-09: 200 mg via INTRAVENOUS

## 2020-07-09 MED ORDER — DEXAMETHASONE SODIUM PHOSPHATE 10 MG/ML IJ SOLN
INTRAMUSCULAR | Status: DC | PRN
  Administered 2020-07-09: 4 mg via INTRAVENOUS

## 2020-07-09 MED ORDER — FENTANYL CITRATE (PF) 250 MCG/5ML IJ SOLN
INTRAMUSCULAR | Status: AC
  Filled 2020-07-09: qty 5

## 2020-07-09 MED ORDER — DEXAMETHASONE SODIUM PHOSPHATE 10 MG/ML IJ SOLN
INTRAMUSCULAR | Status: AC
  Filled 2020-07-09: qty 1

## 2020-07-09 MED ORDER — PHENYLEPHRINE 40 MCG/ML (10ML) SYRINGE FOR IV PUSH (FOR BLOOD PRESSURE SUPPORT)
PREFILLED_SYRINGE | INTRAVENOUS | Status: DC | PRN
  Administered 2020-07-09 (×2): 80 ug via INTRAVENOUS
  Administered 2020-07-09: 120 ug via INTRAVENOUS
  Administered 2020-07-09: 40 ug via INTRAVENOUS
  Administered 2020-07-09: 80 ug via INTRAVENOUS

## 2020-07-09 MED ORDER — CHLORHEXIDINE GLUCONATE 0.12 % MT SOLN
OROMUCOSAL | Status: AC
  Administered 2020-07-09: 15 mL
  Filled 2020-07-09: qty 15

## 2020-07-09 MED ORDER — EPINEPHRINE PF 1 MG/ML IJ SOLN
INTRAMUSCULAR | Status: DC | PRN
  Administered 2020-07-09: 1 mL

## 2020-07-09 MED ORDER — MIDAZOLAM HCL 2 MG/2ML IJ SOLN
INTRAMUSCULAR | Status: AC
  Filled 2020-07-09: qty 2

## 2020-07-09 MED ORDER — PROPOFOL 10 MG/ML IV BOLUS
INTRAVENOUS | Status: AC
  Filled 2020-07-09: qty 20

## 2020-07-09 MED ORDER — LIDOCAINE 2% (20 MG/ML) 5 ML SYRINGE
INTRAMUSCULAR | Status: AC
  Filled 2020-07-09: qty 5

## 2020-07-09 MED ORDER — PROPOFOL 10 MG/ML IV BOLUS
INTRAVENOUS | Status: DC | PRN
  Administered 2020-07-09: 100 mg via INTRAVENOUS

## 2020-07-09 MED ORDER — ONDANSETRON HCL 4 MG/2ML IJ SOLN
INTRAMUSCULAR | Status: AC
  Filled 2020-07-09: qty 2

## 2020-07-09 MED ORDER — ROCURONIUM BROMIDE 10 MG/ML (PF) SYRINGE
PREFILLED_SYRINGE | INTRAVENOUS | Status: AC
  Filled 2020-07-09: qty 10

## 2020-07-09 MED ORDER — EPHEDRINE SULFATE-NACL 50-0.9 MG/10ML-% IV SOSY
PREFILLED_SYRINGE | INTRAVENOUS | Status: DC | PRN
  Administered 2020-07-09 (×3): 10 mg via INTRAVENOUS

## 2020-07-09 MED ORDER — ROCURONIUM BROMIDE 10 MG/ML (PF) SYRINGE
PREFILLED_SYRINGE | INTRAVENOUS | Status: DC | PRN
  Administered 2020-07-09: 50 mg via INTRAVENOUS

## 2020-07-09 MED ORDER — ONDANSETRON HCL 4 MG/2ML IJ SOLN
INTRAMUSCULAR | Status: DC | PRN
  Administered 2020-07-09: 4 mg via INTRAVENOUS

## 2020-07-09 MED ORDER — FENTANYL CITRATE (PF) 250 MCG/5ML IJ SOLN
INTRAMUSCULAR | Status: DC | PRN
  Administered 2020-07-09: 50 ug via INTRAVENOUS

## 2020-07-09 MED ORDER — LACTATED RINGERS IV SOLN: INTRAVENOUS | Status: DC

## 2020-07-09 SURGICAL SUPPLY — 52 items
ADAPTER BRONCH F/PENTAX (ADAPTER) ×4 IMPLANT
ADAPTER VALVE BIOPSY EBUS (MISCELLANEOUS) IMPLANT
ADPTR VALVE BIOPSY EBUS (MISCELLANEOUS)
BRUSH CYTOL CELLEBRITY 1.5X140 (MISCELLANEOUS) ×4 IMPLANT
BRUSH SUPERTRAX BIOPSY (INSTRUMENTS) IMPLANT
BRUSH SUPERTRAX NDL-TIP CYTO (INSTRUMENTS) ×4 IMPLANT
CANISTER SUCT 3000ML PPV (MISCELLANEOUS) ×4 IMPLANT
CHANNEL WORK EXTEND EDGE 180 (KITS) IMPLANT
CHANNEL WORK EXTEND EDGE 45 (KITS) IMPLANT
CHANNEL WORK EXTEND EDGE 90 (KITS) IMPLANT
CONT SPEC 4OZ CLIKSEAL STRL BL (MISCELLANEOUS) ×4 IMPLANT
COVER BACK TABLE 60X90IN (DRAPES) ×4 IMPLANT
COVER DOME SNAP 22 D (MISCELLANEOUS) ×4 IMPLANT
FILTER STRAW FLUID ASPIR (MISCELLANEOUS) IMPLANT
FORCEPS BIOP RJ4 1.8 (CUTTING FORCEPS) IMPLANT
FORCEPS BIOP SUPERTRX PREMAR (INSTRUMENTS) ×4 IMPLANT
GAUZE SPONGE 4X4 12PLY STRL (GAUZE/BANDAGES/DRESSINGS) ×4 IMPLANT
GLOVE BIO SURGEON STRL SZ7.5 (GLOVE) ×4 IMPLANT
GLOVE SURG SS PI 7.5 STRL IVOR (GLOVE) ×8 IMPLANT
GOWN STRL REUS W/ TWL LRG LVL3 (GOWN DISPOSABLE) ×6 IMPLANT
GOWN STRL REUS W/TWL LRG LVL3 (GOWN DISPOSABLE) ×8
KIT CLEAN ENDO COMPLIANCE (KITS) ×8 IMPLANT
KIT LOCATABLE GUIDE (CANNULA) IMPLANT
KIT MARKER FIDUCIAL DELIVERY (KITS) IMPLANT
KIT PROCEDURE EDGE 180 (KITS) IMPLANT
KIT PROCEDURE EDGE 45 (KITS) IMPLANT
KIT PROCEDURE EDGE 90 (KITS) IMPLANT
KIT TURNOVER KIT B (KITS) ×4 IMPLANT
MARKER SKIN DUAL TIP RULER LAB (MISCELLANEOUS) ×4 IMPLANT
NDL EBUS SONO TIP PENTAX (NEEDLE) ×2 IMPLANT
NDL SUPERTRX PREMARK BIOPSY (NEEDLE) ×2 IMPLANT
NEEDLE EBUS SONO TIP PENTAX (NEEDLE) ×4 IMPLANT
NEEDLE SUPERTRX PREMARK BIOPSY (NEEDLE) ×4 IMPLANT
NS IRRIG 1000ML POUR BTL (IV SOLUTION) ×4 IMPLANT
OIL SILICONE PENTAX (PARTS (SERVICE/REPAIRS)) ×4 IMPLANT
PAD ARMBOARD 7.5X6 YLW CONV (MISCELLANEOUS) ×8 IMPLANT
PATCHES PATIENT (LABEL) ×12 IMPLANT
SOL ANTI FOG 6CC (MISCELLANEOUS) ×3 IMPLANT
SOLUTION ANTI FOG 6CC (MISCELLANEOUS) ×1
SYR 20CC LL (SYRINGE) ×8 IMPLANT
SYR 20ML ECCENTRIC (SYRINGE) ×8 IMPLANT
SYR 50ML SLIP (SYRINGE) ×4 IMPLANT
SYR 5ML LUER SLIP (SYRINGE) ×4 IMPLANT
TOWEL OR 17X24 6PK STRL BLUE (TOWEL DISPOSABLE) ×4 IMPLANT
TRAP SPECIMEN MUCOUS 40CC (MISCELLANEOUS) IMPLANT
TUBE CONNECTING 20X1/4 (TUBING) ×8 IMPLANT
UNDERPAD 30X30 (UNDERPADS AND DIAPERS) ×4 IMPLANT
VALVE BIOPSY  SINGLE USE (MISCELLANEOUS) ×4
VALVE BIOPSY SINGLE USE (MISCELLANEOUS) ×3 IMPLANT
VALVE DISPOSABLE (MISCELLANEOUS) ×4 IMPLANT
VALVE SUCTION BRONCHIO DISP (MISCELLANEOUS) ×4 IMPLANT
WATER STERILE IRR 1000ML POUR (IV SOLUTION) ×4 IMPLANT

## 2020-07-09 NOTE — Progress Notes (Signed)
Patient was sleeping so I did not enter the room but prayed for him and his family, to feel at ease and able to rest and for his care team.Kaydra Borgen Thompson [place], [place]   07/09/20 2100  Clinical Encounter Type  Visited With Patient (patient sleeping)  Visit Type Spiritual support  Referral From Nurse  Consult/Referral To Cody Douglas

## 2020-07-09 NOTE — Op Note (Signed)
Video Bronchoscopy Procedure Note Video bronchoscopy with endobronchial biopsy, endobronchial cryo biopsies, cryotherapy, tumor debulking and lesional excision procedure note  Date of Operation: 07/09/2020  Pre-op Diagnosis: Right upper lobe lung mass, endobronchial mass  Post-op Diagnosis: Right upper lobe lung mass, endobronchial mass  Surgeon: Garner Nash, DO   Assistants: none  Anesthesia: conscious sedation, moderate sedation  Operation: Flexible video fiberoptic bronchoscopy and biopsies.  Estimated Blood Loss: 5cc  Complications: none noted  Indications and History: Cody Douglas is 70 y.o. with history of tobacco abuse, abnormal CT imaging, right upper lobe mass.  Recommendation was to perform video fiberoptic bronchoscopy with biopsies. The risks, benefits, complications, treatment options and expected outcomes were discussed with the patient.  The possibilities of pneumothorax, pneumonia, reaction to medication, pulmonary aspiration, perforation of a viscus, bleeding, failure to diagnose a condition and creating a complication requiring transfusion or operation were discussed with the patient who freely signed the consent.    Description of Procedure: The patient was seen in the Preoperative Area, was examined and was deemed appropriate to proceed.  The patient was taken to [facility] endoscopy room 2, identified as Cody Douglas and the procedure verified as Flexible Video Fiberoptic Bronchoscopy.  A Time Out was held and the above information confirmed.   Standard therapeutic Olympus bronchoscope was inserted into the patient's airway via the endotracheal tube.  The standard therapeutic bronchoscope was used for examination of the airways, the main trachea, main bifurcation appears normal, the left lung and left distal subsegments are patent with no evidence of endobronchial disease.  Immediately opening into the right mainstem there was visible tumor extending from the right  upper lobe into the right mainstem with approximately 75% occlusion.  This would ball-valve and move with respiratory cycle within the right mainstem.  At the initiation of the procedure was unable to pass the scope into the lower lobe segments and there was no visualization of the right middle lobe opening.  Using cytology brushes we sent samples to cytology in the room for evaluation.  Then using a 1.7 mm erbe cryotherapy probe we completed endobronchial cryo biopsies.  We saturated the visible lesion with 1: 10,000 dilution of epinephrine and separate 1-2 cc aliquots.  Using the cryotherapy probe we would freeze a portion of the lesion and retract tissue en bloc for sampling.  Multiple passes for tumor debulking and lesional excision were completed.  At the end of the procedure the right mainstem was patent.  The right upper lobe takeoff remained occluded and there was extrinsic compression to the right middle lobe.  A small lip of the right middle lobe opening was visible on the inferior portion.  The distal right lower lobe subsegments were patent with no evidence of tumor.  Thin using ice saline we completed a right mainstem bronchial washings to be sent for cytology.  I saline and diluted aliquots of epinephrine were used for hemostasis.  There was no evidence of active bleeding at the termination of the procedure.  The standard therapeutic bronchoscope was then used for aspiration of the bilateral mainstem's to remove any remaining blood clots and debris.  All distal subsegments at the termination of the procedure were patent.  Bronchoscope was removed from the patient's airway  Samples: 1. Endobronchial brushings from right mainstem 2.  Endobronchial cryo biopsies right mainstem 3.  Right mainstem bronchial washing for cytology  Plans:  We will review the cytology, pathology results.  Appropriate referrals made to medical oncology.  Garner Nash, DO [facility] 07/09/2020 2:51 PM

## 2020-07-09 NOTE — Anesthesia Procedure Notes (Signed)
Procedure Name: Intubation Date/Time: 07/09/2020 12:50 PM Performed by: Lowella Dell, CRNA Pre-anesthesia Checklist: Patient identified, Emergency Drugs available, Suction available and Patient being monitored Patient Re-evaluated:Patient Re-evaluated prior to induction Oxygen Delivery Method: Circle System Utilized Preoxygenation: Pre-oxygenation with 100% oxygen Induction Type: IV induction Ventilation: Mask ventilation without difficulty Laryngoscope Size: Mac and 4 Grade View: Grade I Tube type: Oral Number of attempts: 1 Airway Equipment and Method: Stylet Placement Confirmation: ETT inserted through vocal cords under direct vision,  positive ETCO2 and breath sounds checked- equal and bilateral Secured at: 22 cm Tube secured with: Tape Dental Injury: Teeth and Oropharynx as per pre-operative assessment

## 2020-07-09 NOTE — Progress Notes (Addendum)
  Speech Language Pathology Treatment: Cognitive-Linquistic  Patient Details Name: Cody Douglas MRN: [id number] DOB: 08-Aug-1950 Today's Date: 07/09/2020 Time: 1100-1110 SLP Time Calculation (min) (ACUTE ONLY): 10 min  Assessment / Plan / Recommendation Clinical Impression  Pt seen for speech and cognitive-linguistic therapy.  Swallowing was not addressed this session as pt is NPO for bronchoscopy later this date.  Patient's daughter present for session.  Reports pt is consuming regular diet at present without much difficulty, but does report some coughing with liquids, although it may be related to congestion more than PO intake.  Daughter reports good long term memory, but poorer recall for most recent events.  Pt was able to recall functional information for current setting: pt demonstrated use of call bell and was able to generate reasons to call nurse.  Introduced dysarthria strategies to pt and family.   Pt is largely intelligible, however consonant imprecision is not and family endorses that this is a change from baseline.  Unable to have pt practice strategies in conversation as team arrived to take pt for bronch slightly earlier than expected.  Introduced Quar[employer] manager as well, providing education to daughter and provided handout with both speech (SLOP) and memory (WARM) strategies to reinforce education.   SLP will continue to follow while pt is in house, recommend continuing ST at next level of care.     HPI HPI: 70 y/o male presenting to the ED with slurred speech and facial droop. 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 PMH includes hypertrophic cardiomyopathy, hyponatremia, hypokalemia, chest pain, tobacco use, ETOH abuse,hypertension, prostate cancer. Hospitalization complicated with hemoptysis due to a NEW diagnosed right pulmonary hilar mass.      SLP Plan  Continue with current plan of care        Recommendations                   Follow up Recommendations:  (Continue ST at next level of care) SLP Visit Diagnosis: Cognitive communication deficit (C48.889) Plan: Continue with current plan of care       [place], [place], [place] Office: [phone]  07/09/2020, 12:15 PM

## 2020-07-09 NOTE — Transfer of Care (Signed)
Immediate Anesthesia Transfer of Care Note  Patient: Cody Douglas  Procedure(s) Performed: VIDEO BRONCHOSCOPY WITH ENDOBRONCHIAL ULTRASOUND (Bilateral ) CRYOTHERAPY HEMOSTASIS CONTROL BRONCHIAL WASHINGS BRONCHIAL BRUSHINGS  Patient Location: PACU  Anesthesia Type:General  Level of Consciousness: awake and patient cooperative  Airway & Oxygen Therapy: Patient Spontanous Breathing and Patient connected to nasal cannula oxygen  Post-op Assessment: Report given to RN and Post -op Vital signs reviewed and stable  Post vital signs: Reviewed and stable  Last Vitals:  Vitals Value Taken Time  BP 113/65 07/09/20 1419  Temp    Pulse 86 07/09/20 1427  Resp 28 07/09/20 1427  SpO2 94 % 07/09/20 1427  Vitals shown include unvalidated device data.  Last Pain:  Vitals:   07/09/20 1141  TempSrc:   PainSc: 0-No pain      Patients Stated Pain Goal: 3 (71/29/29 0903)  Complications: No complications documented.

## 2020-07-09 NOTE — Interval H&P Note (Signed)
History and Physical Interval Note:  07/09/2020 12:31 PM  Cody Douglas  has presented today for surgery, with the diagnosis of lung mass, adenopathy, hemoptysis.  The various methods of treatment have been discussed with the patient and family. After consideration of risks, benefits and other options for treatment, the patient has consented to  Procedure(s): VIDEO BRONCHOSCOPY WITH ENDOBRONCHIAL NAVIGATION (Right) VIDEO BRONCHOSCOPY WITH ENDOBRONCHIAL ULTRASOUND (Bilateral) as a surgical intervention.  The patient's history has been reviewed, patient examined, no change in status, stable for surgery.  I have reviewed the patient's chart and labs.  Questions were answered to the patient's satisfaction.     Bud

## 2020-07-09 NOTE — Progress Notes (Signed)
PT Cancellation Note  Patient Details Name: Cody Douglas MRN: [id number] DOB: January 21, 1951   Cancelled Treatment:    Reason Eval/Treat Not Completed: Patient at procedure or test/unavailable PT will re-attempt as time allows.   Aziya Arena A. Gilford Rile PT, DPT Acute Rehabilitation Services Pager [phone] Office [phone]    Linna Hoff 07/09/2020, 12:02 PM

## 2020-07-09 NOTE — Anesthesia Preprocedure Evaluation (Addendum)
Anesthesia Evaluation  Patient identified by MRN, date of birth, ID band Patient awake    Reviewed: Allergy & Precautions, NPO status , Patient's Chart, lab work & pertinent test results  History of Anesthesia Complications Negative for: history of anesthetic complications  Airway Mallampati: III  TM Distance: >3 FB Neck ROM: Full    Dental   Pulmonary Current Smoker and Patient abstained from smoking.,   Right hilar mass with hemoptysis    Pulmonary exam normal        Cardiovascular hypertension, Pt. on medications and Pt. on home beta blockers Normal cardiovascular exam+ Valvular Problems/Murmurs    '22 TTE - EF 55 to 60%. Grade I diastolic dysfunction (impaired relaxation). Severe calcified bulky lesions on posterior and anterior MV leaflets. Despite calcification edges/borders seem irregular and favor vegetations  rather than MAC. Mild MR. Severe bulky calcification/ mass particularly on the Non coronary cusp and commisure between the right and non cusps. Again despite calcification edges irregular and suggest vegetations. Mild AI.    Neuro/Psych PSYCHIATRIC DISORDERS Anxiety CVA, Residual Symptoms    GI/Hepatic GERD  Medicated and Controlled,(+)     substance abuse  alcohol use,   Endo/Other  negative endocrine ROS  Renal/GU negative Renal ROS    Prostate cancer     Musculoskeletal negative musculoskeletal ROS (+)   Abdominal   Peds  Hematology negative hematology ROS (+)   Anesthesia Other Findings Covid test negative   Reproductive/Obstetrics                         Anesthesia Physical Anesthesia Plan  ASA: IV  Anesthesia Plan: General   Post-op Pain Management:    Induction: Intravenous  PONV Risk Score and Plan: 1 and Treatment may vary due to age or medical condition and Ondansetron  Airway Management Planned: Oral ETT  Additional Equipment: None  Intra-op Plan:    Post-operative Plan: Extubation in OR  Informed Consent: I have reviewed the patients History and Physical, chart, labs and discussed the procedure including the risks, benefits and alternatives for the proposed anesthesia with the patient or authorized representative who has indicated his/her understanding and acceptance.     Dental advisory given  Plan Discussed with: CRNA and Anesthesiologist  Anesthesia Plan Comments:        Anesthesia Quick Evaluation

## 2020-07-09 NOTE — Progress Notes (Signed)
PROGRESS NOTE  Cody Douglas:[id number] DOB: 08-11-1950 DOA: 06/30/2020 PCP: Tamsen Roers, MD   LOS: 8 days   Brief Narrative / Interim history: 70 year old male with hypertrophic cardiomyopathy, alcohol and tobacco use, HTN, prostate cancer who came into the hospital with slurred speech and facial droop.  Apparently he continues to drink alcohol.  In the ED underwent brain MRI which showed acute infarction in the external capsule and caudate body on the right without mass-effect or hemorrhage.  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  Subjective / 24h Interval events: Negative for hemoptysis.  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  He was initially placed on aspirin and Plavix but this has been held due to hemoptysis.  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  Lipid panel showed an LDL of 115, he is on statin.  A1c is 6.3.  SNF has been recommended -Neurology recommended aspirin 325 and Plavix 75 for 3 months then Plavix alone given large vessel high-grade stenosis but now on hold -Discussed with pulmonary, they are okay with baby aspirin, will start after bronc  Active Problems New right hilar mass, complicated with hemoptysis and acute blood loss anemia -hemoptysis has now resolved as he is off antiplatelet therapy.  Pulmonary consulted and following, plans for bronchoscopy on 4/29.  Start baby aspirin after bronc  Concern for endocarditis /mitral valve vegetations-based on the TTE, TEE has been attempted but patient developed hemoptysis and had to be canceled.  Seems to be nonbacterial given lack of WBC, afebrile.  Reconsulted  cardiology, not sure about timing but will definitely need a TEE   Essential hypertension -continue metoprolol  Tobacco and alcohol abuse -counseled for cessation  Leukocytosis-resolved  Chronic pain-continue as needed oxycodone and Flexeril  Scheduled Meds: .  stroke: mapping our early stages of recovery book   Does not apply Once  . ALPRAZolam  0.5 mg Oral TID  . atorvastatin  40 mg Oral Daily  . cyclobenzaprine  5 mg Oral TID  . folic acid  1 mg Oral Daily  . metoprolol succinate  25 mg Oral Daily  . multivitamin  1 tablet Oral Daily  . pantoprazole  40 mg Oral Daily  . polyethylene glycol  17 g Oral Daily  . thiamine  100 mg Oral Daily   Continuous Infusions: PRN Meds:.oxyCODONE **AND** acetaminophen, acetaminophen **OR** [DISCONTINUED] acetaminophen (TYLENOL) oral liquid 160 mg/5 mL **OR** [DISCONTINUED] acetaminophen, albuterol, guaiFENesin-dextromethorphan, senna-docusate  Diet Orders (From admission, onward)    Start     Ordered   07/09/20 0001  Diet NPO time specified  Diet effective midnight        07/06/20 1624          DVT prophylaxis:      Code Status: Full Code  Family Communication: no family at bedside   Status is: Inpatient  Remains inpatient appropriate because:Inpatient level of care appropriate due to severity of illness   Dispo: The patient is from: Home              Anticipated d/c is to: SNF              Patient currently is not medically stable to  d/c.   Difficult to place patient No  Level of care: Telemetry Medical  Consultants:  Pulmonary Neurology   Procedures:  2D echo:  1. Left ventricular ejection fraction, by estimation, is 55 to 60%. The left ventricle has normal function. The left ventricle has no regional wall motion abnormalities. Left ventricular diastolic parameters are consistent with Grade I diastolic  dysfunction (impaired relaxation).  2. Right ventricular systolic function is normal. The right ventricular size  is normal. There is normal pulmonary artery systolic pressure.  3. Severe calcified bulky lesions on posterior and anterior leaflets Despite calcification edges/borders seem irregular and favor vegetations rather than MAC Suggest TEE to further evaluate. The mitral valve is abnormal. Mild mitral valve regurgitation. No evidence of mitral stenosis.  4. Severe bulky calcification/ mass particularly on the Non coronary cusp and commisure between the right and non cusps. Again despite calcification edges irregular and suggest vegetations TEE and cardiology consult suggested . The aortic valve is abnormal. Aortic valve regurgitation is mild. No aortic stenosis is  present.  5. The inferior vena cava is normal in size with greater than 50% respiratory variability, suggesting right atrial pressure of 3 mmHg.   Microbiology  none  Antimicrobials: None     Objective: Vitals:   07/08/20 2037 07/09/20 0012 07/09/20 0424 07/09/20 0902  BP: 107/63 103/65 125/67 117/74  Pulse: 81 72 78 86  Resp: _0 Temp: 98.7 F (37.1 C) 98.5 F (36.9 C) 98.1 F (36.7 C) 98 F (36.7 C)  TempSrc: Oral Oral Oral Oral  SpO2: 94% 95% 95% 97%  Weight:      Height:        Intake/Output Summary (Last 24 hours) at 07/09/2020 1024 Last data filed at 07/08/2020 2100 Gross per 24 hour  Intake --  Output 350 ml  Net -350 ml   Filed Weights   07/30/20 0837  Weight: 72.6 kg    Examination:  Constitutional: NAD, in bed Eyes: No icterus ENMT: Moist mucous membranes Neck: normal, supple Respiratory: Clear bilaterally, no wheezing, no crackles Cardiovascular: Regular rate and rhythm, no murmurs, no peripheral edema Abdomen: Soft, nontender, nondistended, bowel sounds positive Musculoskeletal: no clubbing / cyanosis.  Skin: No rashes seen Neurologic: Mild left-sided weakness, no new focal deficits   Data Reviewed: I have independently reviewed following labs and imaging studies   CBC: Recent  Labs  Lab 07/30/20 0325 07/06/20 0333 07/07/20 0224 07/08/20 0253  WBC 9.9  --   --  10.3  NEUTROABS 6.9  --   --   --   HGB 13.2 12.2* 12.6* 13.0  HCT 40.4 37.2* 38.7* 39.1  MCV 95.3  --   --  93.8  PLT 322  --   --  818   Basic Metabolic Panel: Recent Labs  Lab 07/30/2020 0325 07/08/20 0253  NA 137 133*  K 3.8 3.8  CL 106 103  CO2 24 22  GLUCOSE 115* 122*  BUN 14 14  CREATININE 0.56* 0.57*  CALCIUM 9.1 8.8*   Liver Function Tests: Recent Labs  Lab 07/08/20 0253  AST 18  ALT 17  ALKPHOS 84  BILITOT 0.6  PROT 6.2*  ALBUMIN 3.2*   Coagulation Profile: No results for input(s): INR, PROTIME in the last 168 hours. HbA1C: No results for input(s): HGBA1C in the last 72 hours. CBG: No results for input(s): GLUCAP in the last 168 hours.  Recent Results (from the past 240 hour(s))  Resp Panel by RT-PCR (Flu A&B,  Covid) Nasopharyngeal Swab     Status: None   Collection Time: 06/30/20  7:31 PM   Specimen: Nasopharyngeal Swab; Nasopharyngeal(NP) swabs in vial transport medium  Result Value Ref Range Status   SARS Coronavirus 2 by RT PCR NEGATIVE NEGATIVE Final    Comment: (NOTE) SARS-CoV-2 target nucleic acids are NOT DETECTED.  The SARS-CoV-2 RNA is generally detectable in upper respiratory specimens during the acute phase of infection. The lowest concentration of SARS-CoV-2 viral copies this assay can detect is 138 copies/mL. A negative result does not preclude SARS-Cov-2 infection and should not be used as the sole basis for treatment or other patient management decisions. A negative result may occur with  improper specimen collection/handling, submission of specimen other than nasopharyngeal swab, presence of viral mutation(s) within the areas targeted by this assay, and inadequate number of viral copies(<138 copies/mL). A negative result must be combined with clinical observations, patient history, and epidemiological information. The expected result is  Negative.  Fact Sheet for Patients:  EntrepreneurPulse.com.au  Fact Sheet for Healthcare Providers:  IncredibleEmployment.be  This test is no t yet approved or cleared by the [place] FDA and  has been authorized for detection and/or diagnosis of SARS-CoV-2 by FDA under an Emergency Use Authorization (EUA). This EUA will remain  in effect (meaning this test can be used) for the duration of the COVID-19 declaration under Section 564(b)(1) of the Act, 21 U.S.C.section 360bbb-3(b)(1), unless the authorization is terminated  or revoked sooner.       Influenza A by PCR NEGATIVE NEGATIVE Final   Influenza B by PCR NEGATIVE NEGATIVE Final    Comment: (NOTE) The Xpert Xpress SARS-CoV-2/FLU/RSV plus assay is intended as an aid in the diagnosis of influenza from Nasopharyngeal swab specimens and should not be used as a sole basis for treatment. Nasal washings and aspirates are unacceptable for Xpert Xpress SARS-CoV-2/FLU/RSV testing.  Fact Sheet for Patients: EntrepreneurPulse.com.au  Fact Sheet for Healthcare Providers: IncredibleEmployment.be  This test is not yet approved or cleared by the [place] FDA and has been authorized for detection and/or diagnosis of SARS-CoV-2 by FDA under an Emergency Use Authorization (EUA). This EUA will remain in effect (meaning this test can be used) for the duration of the COVID-19 declaration under Section 564(b)(1) of the Act, 21 U.S.C. section 360bbb-3(b)(1), unless the authorization is terminated or revoked.  Performed at [facility], [place] [address] [place], [place] [address]      Radiology Studies: No results found.  Marzetta Board, MD, PhD [facility]  Between 7 am - 7 pm I am available, please contact me via Amion (for emergencies) or Securechat (non urgent messages)  Between 7 pm - 7 am I am not available, please contact night coverage  MD/APP via Amion

## 2020-07-09 NOTE — Progress Notes (Incomplete)
Pt returned from pacu at this time. Alert and oriented.  Family at bedside. VSS.

## 2020-07-10 DIAGNOSIS — F1010 Alcohol abuse, uncomplicated: Secondary | ICD-10-CM | POA: Diagnosis not present

## 2020-07-10 DIAGNOSIS — I10 Essential (primary) hypertension: Secondary | ICD-10-CM | POA: Diagnosis not present

## 2020-07-10 NOTE — Progress Notes (Signed)
PROGRESS NOTE  Cody Douglas YNW:[id number] DOB: 1950-05-15 DOA: 06/30/2020 PCP: Tamsen Roers, MD   LOS: 9 days   Brief Narrative / Interim history: 70 year old male with hypertrophic cardiomyopathy, alcohol and tobacco use, HTN, prostate cancer who came into the hospital with slurred speech and facial droop.  Apparently he continues to drink alcohol.  In the ED underwent brain MRI which showed acute infarction in the external capsule and caudate body on the right without mass-effect or hemorrhage.  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  Subjective / 24h Interval events: No complaints this morning.  Not coughing up any blood.  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  He was initially placed on aspirin and Plavix but this has been held due to hemoptysis.  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  Lipid panel showed an LDL of 115, he is on statin.  A1c is 6.3.  SNF has been recommended -Neurology recommended aspirin 325 and Plavix 75 for 3 months then Plavix alone given large vessel high-grade stenosis but now on hold.  Per pulmonology, can resume Plavix next week  Active Problems New right hilar mass, complicated with hemoptysis and acute blood loss anemia -hemoptysis has now resolved as he is off antiplatelet therapy.  Pulmonary consulted and following, plans for bronchoscopy on 4/29.  Start baby aspirin after bronc  Concern for endocarditis /mitral valve vegetations-based on the TTE, TEE has been attempted but patient developed hemoptysis and had to be canceled.  Seems to be nonbacterial given lack of WBC, afebrile.  Reconsulted cardiology, will get it done on Monday     Essential hypertension -continue metoprolol  Tobacco and alcohol abuse -counseled for cessation  Leukocytosis-resolved  Chronic pain-continue as needed oxycodone and Flexeril  Scheduled Meds: .  stroke: mapping our early stages of recovery book   Does not apply Once  . ALPRAZolam  0.5 mg Oral TID  . atorvastatin  40 mg Oral Daily  . cyclobenzaprine  5 mg Oral TID  . folic acid  1 mg Oral Daily  . metoprolol succinate  25 mg Oral Daily  . multivitamin  1 tablet Oral Daily  . pantoprazole  40 mg Oral Daily  . polyethylene glycol  17 g Oral Daily  . thiamine  100 mg Oral Daily   Continuous Infusions: PRN Meds:.oxyCODONE **AND** acetaminophen, acetaminophen **OR** [DISCONTINUED] acetaminophen (TYLENOL) oral liquid 160 mg/5 mL **OR** [DISCONTINUED] acetaminophen, albuterol, guaiFENesin-dextromethorphan, senna-docusate  Diet Orders (From admission, onward)    Start     Ordered   07/09/20 1515  Diet Heart Room service appropriate? Yes; Fluid consistency: Thin  Diet effective now       Question Answer Comment  Room service appropriate? Yes   Fluid consistency: Thin      07/09/20 1514          DVT prophylaxis:      Code Status: Full Code  Family Communication: no family at bedside   Status is: Inpatient  Remains inpatient appropriate because:Inpatient level of care appropriate due to severity of illness   Dispo: The patient is from: Home              Anticipated d/c is to: SNF  Patient currently is not medically stable to d/c.   Difficult to place patient No  Level of care: Telemetry Medical  Consultants:  Pulmonary Neurology   Procedures:  2D echo:  1. Left ventricular ejection fraction, by estimation, is 55 to 60%. The left ventricle has normal function. The left ventricle has no regional wall motion abnormalities. Left ventricular diastolic parameters are consistent with Grade I diastolic  dysfunction (impaired relaxation).  2. Right  ventricular systolic function is normal. The right ventricular size is normal. There is normal pulmonary artery systolic pressure.  3. Severe calcified bulky lesions on posterior and anterior leaflets Despite calcification edges/borders seem irregular and favor vegetations rather than MAC Suggest TEE to further evaluate. The mitral valve is abnormal. Mild mitral valve regurgitation. No evidence of mitral stenosis.  4. Severe bulky calcification/ mass particularly on the Non coronary cusp and commisure between the right and non cusps. Again despite calcification edges irregular and suggest vegetations TEE and cardiology consult suggested . The aortic valve is abnormal. Aortic valve regurgitation is mild. No aortic stenosis is  present.  5. The inferior vena cava is normal in size with greater than 50% respiratory variability, suggesting right atrial pressure of 3 mmHg.   Microbiology  none  Antimicrobials: None     Objective: Vitals:   07/09/20 1956 07/09/20 2326 07/10/20 0320 07/10/20 0847  BP: 101/67 106/67 108/63 (!) 114/59  Pulse: 98 83 70 79  Resp: _0 Temp: 98.5 F (36.9 C) 98.2 F (36.8 C) 97.9 F (36.6 C) 98.4 F (36.9 C)  TempSrc: Oral Oral Oral Oral  SpO2: 94% 93% 95% 97%  Weight:      Height:        Intake/Output Summary (Last 24 hours) at 07/10/2020 1148 Last data filed at 07/10/2020 1056 Gross per 24 hour  Intake 240 ml  Output 1085 ml  Net -845 ml   Filed Weights   07/05/20 0837  Weight: 72.6 kg    Examination:  Constitutional: No distress, in bed Eyes: No scleral icterus ENMT: Moist mucous membranes Neck: normal, supple Respiratory: Clear bilaterally, no wheezing or crackles Cardiovascular: Regular rate and rhythm, no murmurs, no edema Abdomen: Soft, NT, ND, bowel sounds positive Musculoskeletal: no clubbing / cyanosis.  Skin: No rashes seen Neurologic: Mild left-sided weakness, no new focal deficits   Data Reviewed: I have  independently reviewed following labs and imaging studies   CBC: Recent Labs  Lab 07/05/20 0325 07/06/20 0333 07/07/20 0224 07/08/20 0253  WBC 9.9  --   --  10.3  NEUTROABS 6.9  --   --   --   HGB 13.2 12.2* 12.6* 13.0  HCT 40.4 37.2* 38.7* 39.1  MCV 95.3  --   --  93.8  PLT 322  --   --  390   Basic Metabolic Panel: Recent Labs  Lab 07/05/20 0325 07/08/20 0253  NA 137 133*  K 3.8 3.8  CL 106 103  CO2 24 22  GLUCOSE 115* 122*  BUN 14 14  CREATININE 0.56* 0.57*  CALCIUM 9.1 8.8*   Liver Function Tests: Recent Labs  Lab 07/08/20 0253  AST 18  ALT 17  ALKPHOS 84  BILITOT 0.6  PROT 6.2*  ALBUMIN 3.2*   Coagulation Profile: No results for input(s): INR, PROTIME in the last 168 hours. HbA1C: No results for input(s): HGBA1C in the last 72 hours. CBG: No results for input(s): GLUCAP in the last 168 hours.  Recent Results (from the  past 240 hour(s))  Resp Panel by RT-PCR (Flu A&B, Covid) Nasopharyngeal Swab     Status: None   Collection Time: 06/30/20  7:31 PM   Specimen: Nasopharyngeal Swab; Nasopharyngeal(NP) swabs in vial transport medium  Result Value Ref Range Status   SARS Coronavirus 2 by RT PCR NEGATIVE NEGATIVE Final    Comment: (NOTE) SARS-CoV-2 target nucleic acids are NOT DETECTED.  The SARS-CoV-2 RNA is generally detectable in upper respiratory specimens during the acute phase of infection. The lowest concentration of SARS-CoV-2 viral copies this assay can detect is 138 copies/mL. A negative result does not preclude SARS-Cov-2 infection and should not be used as the sole basis for treatment or other patient management decisions. A negative result may occur with  improper specimen collection/handling, submission of specimen other than nasopharyngeal swab, presence of viral mutation(s) within the areas targeted by this assay, and inadequate number of viral copies(<138 copies/mL). A negative result must be combined with clinical observations,  patient history, and epidemiological information. The expected result is Negative.  Fact Sheet for Patients:  EntrepreneurPulse.com.au  Fact Sheet for Healthcare Providers:  IncredibleEmployment.be  This test is no t yet approved or cleared by the [place] FDA and  has been authorized for detection and/or diagnosis of SARS-CoV-2 by FDA under an Emergency Use Authorization (EUA). This EUA will remain  in effect (meaning this test can be used) for the duration of the COVID-19 declaration under Section 564(b)(1) of the Act, 21 U.S.C.section 360bbb-3(b)(1), unless the authorization is terminated  or revoked sooner.       Influenza A by PCR NEGATIVE NEGATIVE Final   Influenza B by PCR NEGATIVE NEGATIVE Final    Comment: (NOTE) The Xpert Xpress SARS-CoV-2/FLU/RSV plus assay is intended as an aid in the diagnosis of influenza from Nasopharyngeal swab specimens and should not be used as a sole basis for treatment. Nasal washings and aspirates are unacceptable for Xpert Xpress SARS-CoV-2/FLU/RSV testing.  Fact Sheet for Patients: EntrepreneurPulse.com.au  Fact Sheet for Healthcare Providers: IncredibleEmployment.be  This test is not yet approved or cleared by the [place] FDA and has been authorized for detection and/or diagnosis of SARS-CoV-2 by FDA under an Emergency Use Authorization (EUA). This EUA will remain in effect (meaning this test can be used) for the duration of the COVID-19 declaration under Section 564(b)(1) of the Act, 21 U.S.C. section 360bbb-3(b)(1), unless the authorization is terminated or revoked.  Performed at [facility], [place] [address] [place], [place] [address]      Radiology Studies: No results found.  Marzetta Board, MD, PhD [facility]  Between 7 am - 7 pm I am available, please contact me via Amion (for emergencies) or Securechat (non urgent  messages)  Between 7 pm - 7 am I am not available, please contact night coverage MD/APP via Amion

## 2020-07-10 NOTE — Progress Notes (Signed)
    [facility] has been requested to perform a transesophageal echocardiogram on 07/12/20 for rule out endocarditis.  After careful review of history and examination, the risks and benefits of transesophageal echocardiogram have been explained including risks of esophageal damage, perforation (1:10,000 risk), bleeding, pharyngeal hematoma as well as other potential complications associated with conscious sedation including aspiration, arrhythmia, respiratory failure and death. Alternatives to treatment were discussed, questions were answered. Patient is willing to proceed.   TEE scheduled for 07/12/20 at 12pm with Dr. Stanford Breed.  Roby Lofts, PA-C 07/10/2020 2:41 PM

## 2020-07-11 DIAGNOSIS — F1010 Alcohol abuse, uncomplicated: Secondary | ICD-10-CM | POA: Diagnosis not present

## 2020-07-11 LAB — BASIC METABOLIC PANEL
Anion gap: 7 (ref 5–15)
BUN: 12 mg/dL (ref 8–23)
CO2: 29 mmol/L (ref 22–32)
Calcium: 8.9 mg/dL (ref 8.9–10.3)
Chloride: 100 mmol/L (ref 98–111)
Creatinine, Ser: 0.68 mg/dL (ref 0.61–1.24)
GFR, Estimated: >60 mL/min (ref >60)
Glucose, Bld: 114 mg/dL — ABNORMAL HIGH (ref 70–99)
Potassium: 3.9 mmol/L (ref 3.5–5.1)
Sodium: 136 mmol/L (ref 135–145)

## 2020-07-11 LAB — CBC
HCT: 37 % — ABNORMAL LOW (ref 39.0–52.0)
Hemoglobin: 11.9 g/dL — ABNORMAL LOW (ref 13.0–17.0)
MCH: 30.7 pg (ref 26.0–34.0)
MCHC: 32.2 g/dL (ref 30.0–36.0)
MCV: 95.4 fL (ref 80.0–100.0)
Platelets: 354 10*3/uL (ref 150–400)
RBC: 3.88 MIL/uL — ABNORMAL LOW (ref 4.22–5.81)
RDW: 12.1 % (ref 11.5–15.5)
WBC: 8.2 10*3/uL (ref 4.0–10.5)
nRBC: 0 % (ref 0.0–0.2)

## 2020-07-11 NOTE — H&P (View-Only) (Signed)
PROGRESS NOTE  Cody Douglas MRN:[id number] DOB: 06/24/1950 DOA: 06/30/2020 PCP: Little, James, MD   LOS: 10 days   Brief Narrative / Interim history: 70-year-old male with hypertrophic cardiomyopathy, alcohol and tobacco use, HTN, prostate cancer who came into the hospital with slurred speech and facial droop.  Apparently he continues to drink alcohol.  In the ED underwent brain MRI which showed acute infarction in the external capsule and caudate body on the right without mass-effect or hemorrhage.  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  He was initially placed on aspirin and Plavix but this has been held due to hemoptysis.  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  Lipid panel showed an LDL of 115, he is on statin.  A1c is 6.3.  SNF has been recommended -Neurology recommended aspirin 325 and Plavix 75 for 3 months then Plavix alone given large vessel high-grade stenosis but now on hold.  Per pulmonology, can resume Plavix next week  Active Problems New right hilar mass, complicated with hemoptysis and acute blood loss anemia -hemoptysis has now resolved as he is off antiplatelet therapy.  Pulmonary consulted and following, plans for bronchoscopy on 4/29.  Start baby aspirin after bronc/TEE  Concern for endocarditis /mitral valve vegetations-based on the TTE, TEE has been attempted but patient developed hemoptysis and had to be canceled.  Seems to be nonbacterial given lack of WBC, afebrile.  TEE planned for tomorrow   Essential hypertension -continue  metoprolol  Tobacco and alcohol abuse -counseled for cessation  Leukocytosis-resolved  Chronic pain-continue as needed oxycodone and Flexeril  Scheduled Meds: .  stroke: mapping our early stages of recovery book   Does not apply Once  . ALPRAZolam  0.5 mg Oral TID  . atorvastatin  40 mg Oral Daily  . cyclobenzaprine  5 mg Oral TID  . folic acid  1 mg Oral Daily  . metoprolol succinate  25 mg Oral Daily  . multivitamin  1 tablet Oral Daily  . pantoprazole  40 mg Oral Daily  . polyethylene glycol  17 g Oral Daily  . thiamine  100 mg Oral Daily   Continuous Infusions: PRN Meds:.oxyCODONE **AND** acetaminophen, acetaminophen **OR** [DISCONTINUED] acetaminophen (TYLENOL) oral liquid 160 mg/5 mL **OR** [DISCONTINUED] acetaminophen, albuterol, guaiFENesin-dextromethorphan, senna-docusate  Diet Orders (From admission, onward)    Start     Ordered   07/10/20 1301  Diet Heart Room service appropriate? No; Fluid consistency: Thin  Diet effective 1000       Question Answer Comment  Room service appropriate? No   Fluid consistency: Thin      07/10/20 1300          DVT prophylaxis:      Code Status: Full Code  Family Communication: no family at bedside   Status is: Inpatient  Remains inpatient appropriate because:Inpatient level of care appropriate due to severity of illness   Dispo: The patient is from: Home              Anticipated d/c is to: SNF              Patient currently is   not medically stable to d/c.   Difficult to place patient No  Level of care: Telemetry Medical  Consultants:  Pulmonary Neurology   Procedures:  2D echo:  1. Left ventricular ejection fraction, by estimation, is 55 to 60%. The left ventricle has normal function. The left ventricle has no regional wall motion abnormalities. Left ventricular diastolic parameters are consistent with Grade I diastolic  dysfunction (impaired relaxation).  2. Right ventricular systolic function is normal. The  right ventricular size is normal. There is normal pulmonary artery systolic pressure.  3. Severe calcified bulky lesions on posterior and anterior leaflets Despite calcification edges/borders seem irregular and favor vegetations rather than MAC Suggest TEE to further evaluate. The mitral valve is abnormal. Mild mitral valve regurgitation. No evidence of mitral stenosis.  4. Severe bulky calcification/ mass particularly on the Non coronary cusp and commisure between the right and non cusps. Again despite calcification edges irregular and suggest vegetations TEE and cardiology consult suggested . The aortic valve is abnormal. Aortic valve regurgitation is mild. No aortic stenosis is  present.  5. The inferior vena cava is normal in size with greater than 50% respiratory variability, suggesting right atrial pressure of 3 mmHg.   Microbiology  none  Antimicrobials: None     Objective: Vitals:   07/10/20 1944 07/10/20 2340 07/11/20 0418 07/11/20 0738  BP: 111/74 105/66 126/71 112/66  Pulse: 80 67 66 65  Resp: 18 18 18 16  Temp: 97.8 F (36.6 C) 97.7 F (36.5 C) 97.8 F (36.6 C) 97.8 F (36.6 C)  TempSrc: Oral  Oral Oral  SpO2: 96% 94% 97% 94%  Weight:      Height:        Intake/Output Summary (Last 24 hours) at 07/11/2020 1024 Last data filed at 07/11/2020 0600 Gross per 24 hour  Intake 350 ml  Output 1225 ml  Net -875 ml   Filed Weights   07/05/20 0837  Weight: 72.6 kg    Examination:  Constitutional: NAD, in bed Respiratory: Clear bilaterally without wheezing Cardiovascular: Regular rate and rhythm, no murmurs   Data Reviewed: I have independently reviewed following labs and imaging studies   CBC: Recent Labs  Lab 07/05/20 0325 07/06/20 0333 07/07/20 0224 07/08/20 0253 07/11/20 0156  WBC 9.9  --   --  10.3 8.2  NEUTROABS 6.9  --   --   --   --   HGB 13.2 12.2* 12.6* 13.0 11.9*  HCT 40.4 37.2* 38.7* 39.1 37.0*  MCV 95.3  --   --  93.8 95.4  PLT 322  --   --   322 354   Basic Metabolic Panel: Recent Labs  Lab 07/05/20 0325 07/08/20 0253 07/11/20 0156  NA 137 133* 136  K 3.8 3.8 3.9  CL 106 103 100  CO2 24 22 29  GLUCOSE 115* 122* 114*  BUN 14 14 12  CREATININE 0.56* 0.57* 0.68  CALCIUM 9.1 8.8* 8.9   Liver Function Tests: Recent Labs  Lab 07/08/20 0253  AST 18  ALT 17  ALKPHOS 84  BILITOT 0.6  PROT 6.2*  ALBUMIN 3.2*   Coagulation Profile: No results for input(s): INR, PROTIME in the last 168 hours. HbA1C: No results for input(s): HGBA1C in the last 72 hours. CBG: No results for input(s): GLUCAP in the last 168 hours.  No results found for this or any previous visit (from the past 240 hour(s)).   Radiology Studies: No results found.  Tarahji Ramthun, MD, PhD [facility]    Between 7 am - 7 pm I am available, please contact me via Amion (for emergencies) or Securechat (non urgent messages)  Between 7 pm - 7 am I am not available, please contact night coverage MD/APP via Amion   

## 2020-07-11 NOTE — Progress Notes (Signed)
PROGRESS NOTE  [place] Cody Douglas OAC:[id number] DOB: 09/06/1950 DOA: 06/30/2020 PCP: Tamsen Roers, MD   LOS: 10 days   Brief Narrative / Interim history: 70 year old male with hypertrophic cardiomyopathy, alcohol and tobacco use, HTN, prostate cancer who came into the hospital with slurred speech and facial droop.  Apparently he continues to drink alcohol.  In the ED underwent brain MRI which showed acute infarction in the external capsule and caudate body on the right without mass-effect or hemorrhage.  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  He was initially placed on aspirin and Plavix but this has been held due to hemoptysis.  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  Lipid panel showed an LDL of 115, he is on statin.  A1c is 6.3.  SNF has been recommended -Neurology recommended aspirin 325 and Plavix 75 for 3 months then Plavix alone given large vessel high-grade stenosis but now on hold.  Per pulmonology, can resume Plavix next week  Active Problems New right hilar mass, complicated with hemoptysis and acute blood loss anemia -hemoptysis has now resolved as he is off antiplatelet therapy.  Pulmonary consulted and following, plans for bronchoscopy on 4/29.  Start baby aspirin after bronc/TEE  Concern for endocarditis /mitral valve vegetations-based on the TTE, TEE has been attempted but patient developed hemoptysis and had to be canceled.  Seems to be nonbacterial given lack of WBC, afebrile.  TEE planned for tomorrow   Essential hypertension -continue  metoprolol  Tobacco and alcohol abuse -counseled for cessation  Leukocytosis-resolved  Chronic pain-continue as needed oxycodone and Flexeril  Scheduled Meds: .  stroke: mapping our early stages of recovery book   Does not apply Once  . ALPRAZolam  0.5 mg Oral TID  . atorvastatin  40 mg Oral Daily  . cyclobenzaprine  5 mg Oral TID  . folic acid  1 mg Oral Daily  . metoprolol succinate  25 mg Oral Daily  . multivitamin  1 tablet Oral Daily  . pantoprazole  40 mg Oral Daily  . polyethylene glycol  17 g Oral Daily  . thiamine  100 mg Oral Daily   Continuous Infusions: PRN Meds:.oxyCODONE **AND** acetaminophen, acetaminophen **OR** [DISCONTINUED] acetaminophen (TYLENOL) oral liquid 160 mg/5 mL **OR** [DISCONTINUED] acetaminophen, albuterol, guaiFENesin-dextromethorphan, senna-docusate  Diet Orders (From admission, onward)    Start     Ordered   07/10/20 1301  Diet Heart Room service appropriate? No; Fluid consistency: Thin  Diet effective 1000       Question Answer Comment  Room service appropriate? No   Fluid consistency: Thin      07/10/20 1300          DVT prophylaxis:      Code Status: Full Code  Family Communication: no family at bedside   Status is: Inpatient  Remains inpatient appropriate because:Inpatient level of care appropriate due to severity of illness   Dispo: The patient is from: Home              Anticipated d/c is to: SNF              Patient currently is  not medically stable to d/c.   Difficult to place patient No  Level of care: Telemetry Medical  Consultants:  Pulmonary Neurology   Procedures:  2D echo:  1. Left ventricular ejection fraction, by estimation, is 55 to 60%. The left ventricle has normal function. The left ventricle has no regional wall motion abnormalities. Left ventricular diastolic parameters are consistent with Grade I diastolic  dysfunction (impaired relaxation).  2. Right ventricular systolic function is normal. The  right ventricular size is normal. There is normal pulmonary artery systolic pressure.  3. Severe calcified bulky lesions on posterior and anterior leaflets Despite calcification edges/borders seem irregular and favor vegetations rather than MAC Suggest TEE to further evaluate. The mitral valve is abnormal. Mild mitral valve regurgitation. No evidence of mitral stenosis.  4. Severe bulky calcification/ mass particularly on the Non coronary cusp and commisure between the right and non cusps. Again despite calcification edges irregular and suggest vegetations TEE and cardiology consult suggested . The aortic valve is abnormal. Aortic valve regurgitation is mild. No aortic stenosis is  present.  5. The inferior vena cava is normal in size with greater than 50% respiratory variability, suggesting right atrial pressure of 3 mmHg.   Microbiology  none  Antimicrobials: None     Objective: Vitals:   07/10/20 1944 07/10/20 2340 07/11/20 0418 07/11/20 0738  BP: 111/74 105/66 126/71 112/66  Pulse: 80 67 66 65  Resp: _0 Temp: 97.8 F (36.6 C) 97.7 F (36.5 C) 97.8 F (36.6 C) 97.8 F (36.6 C)  TempSrc: Oral  Oral Oral  SpO2: 96% 94% 97% 94%  Weight:      Height:        Intake/Output Summary (Last 24 hours) at 07/11/2020 1024 Last data filed at 07/11/2020 0600 Gross per 24 hour  Intake 350 ml  Output 1225 ml  Net -875 ml   Filed Weights   07/05/20 0837  Weight: 72.6 kg    Examination:  Constitutional: NAD, in bed Respiratory: Clear bilaterally without wheezing Cardiovascular: Regular rate and rhythm, no murmurs   Data Reviewed: I have independently reviewed following labs and imaging studies   CBC: Recent Labs  Lab 07/05/20 0325 07/06/20 0333 07/07/20 0224 07/08/20 0253 07/11/20 0156  WBC 9.9  --   --  10.3 8.2  NEUTROABS 6.9  --   --   --   --   HGB 13.2 12.2* 12.6* 13.0 11.9*  HCT 40.4 37.2* 38.7* 39.1 37.0*  MCV 95.3  --   --  93.8 95.4  PLT 322  --   --   322 403   Basic Metabolic Panel: Recent Labs  Lab 07/05/20 0325 07/08/20 0253 07/11/20 0156  NA 137 133* 136  K 3.8 3.8 3.9  CL 106 103 100  CO2 _1 GLUCOSE 115* 122* 114*  BUN _2 CREATININE 0.56* 0.57* 0.68  CALCIUM 9.1 8.8* 8.9   Liver Function Tests: Recent Labs  Lab 07/08/20 0253  AST 18  ALT 17  ALKPHOS 84  BILITOT 0.6  PROT 6.2*  ALBUMIN 3.2*   Coagulation Profile: No results for input(s): INR, PROTIME in the last 168 hours. HbA1C: No results for input(s): HGBA1C in the last 72 hours. CBG: No results for input(s): GLUCAP in the last 168 hours.  No results found for this or any previous visit (from the past 240 hour(s)).   Radiology Studies: No results found.  Marzetta Board, MD, PhD [facility]  Between 7 am - 7 pm I am available, please contact me via Amion (for emergencies) or Securechat (non urgent messages)  Between 7 pm - 7 am I am not available, please contact night coverage MD/APP via Amion

## 2020-07-12 ENCOUNTER — Encounter: Payer: Self-pay | Admitting: *Deleted

## 2020-07-12 ENCOUNTER — Inpatient Hospital Stay (HOSPITAL_COMMUNITY): Payer: Medicare Other | Admitting: Certified Registered Nurse Anesthetist

## 2020-07-12 ENCOUNTER — Encounter (HOSPITAL_COMMUNITY)
Admission: EM | Disposition: A | Discharge status: Skilled Nursing Facility | Payer: Self-pay | Source: Home / Self Care | Attending: Internal Medicine

## 2020-07-12 ENCOUNTER — Encounter (HOSPITAL_COMMUNITY): Payer: Self-pay | Admitting: Internal Medicine

## 2020-07-12 ENCOUNTER — Inpatient Hospital Stay (HOSPITAL_COMMUNITY): Payer: Medicare Other

## 2020-07-12 DIAGNOSIS — I10 Essential (primary) hypertension: Secondary | ICD-10-CM | POA: Diagnosis not present

## 2020-07-12 DIAGNOSIS — I351 Nonrheumatic aortic (valve) insufficiency: Secondary | ICD-10-CM | POA: Diagnosis not present

## 2020-07-12 DIAGNOSIS — I340 Nonrheumatic mitral (valve) insufficiency: Secondary | ICD-10-CM | POA: Diagnosis not present

## 2020-07-12 DIAGNOSIS — R918 Other nonspecific abnormal finding of lung field: Secondary | ICD-10-CM

## 2020-07-12 DIAGNOSIS — F1010 Alcohol abuse, uncomplicated: Secondary | ICD-10-CM | POA: Diagnosis not present

## 2020-07-12 HISTORY — PX: TEE WITHOUT CARDIOVERSION: SHX5443

## 2020-07-12 LAB — ECHO TEE
AV Mean grad: 5 mmHg
AV Peak grad: 9.5 mmHg
Ao pk vel: 1.54 m/s
P 1/2 time: 965 msec

## 2020-07-12 LAB — CYTOLOGY - NON PAP

## 2020-07-12 LAB — RESP PANEL BY RT-PCR (FLU A&B, COVID) ARPGX2
Influenza A by PCR: NEGATIVE
Influenza B by PCR: NEGATIVE
SARS Coronavirus 2 by RT PCR: NEGATIVE

## 2020-07-12 SURGERY — ECHOCARDIOGRAM, TRANSESOPHAGEAL
Anesthesia: Monitor Anesthesia Care

## 2020-07-12 MED ORDER — PROPOFOL 500 MG/50ML IV EMUL
INTRAVENOUS | Status: DC | PRN
  Administered 2020-07-12: 75 ug/kg/min via INTRAVENOUS

## 2020-07-12 MED ORDER — PHENYLEPHRINE 40 MCG/ML (10ML) SYRINGE FOR IV PUSH (FOR BLOOD PRESSURE SUPPORT)
PREFILLED_SYRINGE | INTRAVENOUS | Status: DC | PRN
  Administered 2020-07-12 (×2): 80 ug via INTRAVENOUS

## 2020-07-12 MED ORDER — ASPIRIN 325 MG PO TABS
325.0000 mg | ORAL_TABLET | Freq: Every day | ORAL | Status: DC
  Administered 2020-07-12 – 2020-07-13 (×2): 325 mg via ORAL
  Filled 2020-07-12 (×2): qty 1

## 2020-07-12 MED ORDER — PROPOFOL 10 MG/ML IV BOLUS
INTRAVENOUS | Status: DC | PRN
  Administered 2020-07-12 (×2): 25 mg via INTRAVENOUS
  Administered 2020-07-12: 20 mg via INTRAVENOUS

## 2020-07-12 MED ORDER — LIDOCAINE 2% (20 MG/ML) 5 ML SYRINGE
INTRAMUSCULAR | Status: DC | PRN
  Administered 2020-07-12: 60 mg via INTRAVENOUS

## 2020-07-12 MED ORDER — SODIUM CHLORIDE 0.9 % IV SOLN
INTRAVENOUS | Status: AC | PRN
  Administered 2020-07-12: 500 mL via INTRAVENOUS

## 2020-07-12 NOTE — Progress Notes (Addendum)
    Transesophageal Echocardiogram Note  Cody Douglas [id number] 1950-11-01  Procedure: Transesophageal Echocardiogram Indications: Possible SBE  Procedure Details Consent: Obtained Time Out: Verified patient identification, verified procedure, site/side was marked, verified correct patient position, special equipment/implants available, Radiology Safety Procedures followed,  medications/allergies/relevent history reviewed, required imaging and test results available.  Performed  Medications:  Pt sedated by anesthesia with diprovan 183 mg IV total.  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   Complications: No apparent complications Patient did tolerate procedure well.  Kirk Ruths, MD

## 2020-07-12 NOTE — Plan of Care (Signed)
Contacted by Dr. Cruzita Lederer. I saw pt on 07/02/20 this admission, he was found to have right BG infarct with high grade stenosis of right M1. Recommended ASA 325 and plavix 75 DAPT for 3 months given the intracranial stenosis. However, pt was found to have hemoptysis, DAPT was on hold. Pulmonary consulted and CT chest concerning for lung malignancy. Currently s/p biopsy. In terms of antiplatelet at this time, discussed with Dr. Cruzita Lederer, given his risk of recurrent hemoptysis and s/p biopsy, recommend ASA 325mg  only for lower bleeding risk. Will follow up at stroke clinic in 4 weeks for further antiplatelet regimen adjustment if needed.   Rosalin Hawking, MD PhD Stroke Neurology 07/12/2020 6:10 PM

## 2020-07-12 NOTE — Progress Notes (Signed)
  Echocardiogram Echocardiogram Transesophageal has been performed.  Jennette Dubin 07/12/2020, 9:35 AM

## 2020-07-12 NOTE — Progress Notes (Signed)
PT Cancellation Note  Patient Details Name: Cody Douglas MRN: [id number] DOB: 1951/02/22   Cancelled Treatment:    Reason Eval/Treat Not Completed: Patient at procedure or test/unavailable (endoscopy). Will check back as time allows.  Leighton Roach, PT  Acute Rehab Services  Pager [phone] [place] 07/12/2020, 8:55 AM

## 2020-07-12 NOTE — Anesthesia Postprocedure Evaluation (Signed)
Anesthesia Post Note  Patient: AMERE BRICCO  Procedure(s) Performed: VIDEO BRONCHOSCOPY WITH ENDOBRONCHIAL ULTRASOUND (Bilateral ) CRYOTHERAPY HEMOSTASIS CONTROL BRONCHIAL WASHINGS BRONCHIAL BRUSHINGS     Patient location during evaluation: PACU Anesthesia Type: General Level of consciousness: awake and alert Pain management: pain level controlled Vital Signs Assessment: post-procedure vital signs reviewed and stable Respiratory status: spontaneous breathing, nonlabored ventilation, respiratory function stable and patient connected to nasal cannula oxygen Cardiovascular status: blood pressure returned to baseline and stable Postop Assessment: no apparent nausea or vomiting Anesthetic complications: no   No complications documented.  Last Vitals:  Vitals:   07/12/20 0759 07/12/20 0830  BP: 133/71 132/70  Pulse: 70 71  Resp: 20 (!) 22  Temp: 36.4 C (!) 36.4 C  SpO2: 97% 96%    Last Pain:  Vitals:   07/12/20 0925  TempSrc:   PainSc: 0-No pain                 Audry Pili

## 2020-07-12 NOTE — Progress Notes (Signed)
SLP Cancellation Note  Patient Details Name: DOYEL MULKERN MRN: [id number] DOB: 28-Apr-1950   Cancelled treatment:        Attempted to see pt for ongoing dysphagia and speech therapy.  Pt unavailable at time of attempt; off floor for procedure.  SLP will reattempt as schedule permits.   Celedonio Savage, MA, [place] Office: [phone] 07/12/2020, 10:04 AM

## 2020-07-12 NOTE — Anesthesia Postprocedure Evaluation (Signed)
Anesthesia Post Note  Patient: Cody Douglas  Procedure(s) Performed: TRANSESOPHAGEAL ECHOCARDIOGRAM (TEE) (N/A )     Patient location during evaluation: Endoscopy Anesthesia Type: MAC Level of consciousness: awake Pain management: pain level controlled Vital Signs Assessment: post-procedure vital signs reviewed and stable Respiratory status: spontaneous breathing, nonlabored ventilation, respiratory function stable and patient connected to nasal cannula oxygen Cardiovascular status: stable and blood pressure returned to baseline Postop Assessment: no apparent nausea or vomiting Anesthetic complications: no   No complications documented.  Last Vitals:  Vitals:   07/12/20 0940 07/12/20 1214  BP:  116/72  Pulse:  64  Resp:  19  Temp: 36.4 C 36.8 C  SpO2:  97%    Last Pain:  Vitals:   07/12/20 1214  TempSrc: Oral  PainSc:                  Milton Sagona P Lira Stephen

## 2020-07-12 NOTE — Progress Notes (Signed)
Physical Therapy Treatment Patient Details Name: Cody Douglas MRN: [id number] DOB: 12/03/1950 Today's Date: 07/12/2020    History of Present Illness Pt is a 70 y/o male presenting to the ED with slurred speech and facial droop. 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 Pt developed hemoptysis and CT revealed lung mass like lung CA. PMH includes hypertrophic cardiomyopathy, hyponatremia, hypokalemia, chest pain, tobacco use, ETOH abuse,hypertension, prostate cancer.    PT Comments    Pt making good progress today with PT. Able to come to EOB with vc's and min A for LE's off bed, pt able to manage upper body with use of rail. Pt seated EOB with supervision and able to perform LE there ex without LOB. Pt required min A for steadying and power for sit<>stand from bed and BSC. Pt ambulated 25' with RW with vc's to attend to positioning and step length of LLE, esp with turning. Walking distance limited by pt beginning to have a bowel mvmt. BSC brought behind pt to sit and finish. Pt seated in recliner after clean up with breakfast tray. PT will continue to follow.    Follow Up Recommendations  SNF;Supervision/Assistance - 24 hour     Equipment Recommendations  Rolling walker with 5" wheels;3in1 (PT)    Recommendations for Other Services       Precautions / Restrictions Precautions Precautions: Fall Precaution Comments: Reports hx of falls Restrictions Weight Bearing Restrictions: No    Mobility  Bed Mobility Overal bed mobility: Needs Assistance Bed Mobility: Supine to Sit Rolling: Supervision   Supine to sit: Min assist     General bed mobility comments: pt began to bring LE's off bed first but had mentioned having current low back pain. Pt cued to bridge knees and log roll which he was able to do with supervision. Min A for LE's off EOB then pt able to use rail to sit up with supervision.     Transfers Overall transfer level: Needs assistance Equipment used: Rolling walker (2 wheeled) Transfers: Sit to/from Stand Sit to Stand: Min assist         General transfer comment: pt needed multiple cues for hand placement each time he stood. Min A for power up from bed and BSC.  Ambulation/Gait Ambulation/Gait assistance: Min assist Gait Distance (Feet): 25 Feet Assistive device: Rolling walker (2 wheeled) Gait Pattern/deviations: Step-through pattern;Trunk flexed;Decreased step length - left;Decreased weight shift to left Gait velocity: decreased Gait velocity interpretation: <1.31 ft/sec, indicative of household ambulator General Gait Details: vc's needed to attend to LLE positioning, especially with turning and LLE frequently lags behind R with insufficient step length. Walking distance limited by BM today   Stairs             Wheelchair Mobility    Modified Rankin (Stroke Patients Only) Modified Rankin (Stroke Patients Only) Pre-Morbid Rankin Score: No significant disability Modified Rankin: Moderately severe disability     Balance Overall balance assessment: Needs assistance Sitting-balance support: Bilateral upper extremity supported;Feet supported Sitting balance-Leahy Scale: Fair Sitting balance - Comments: able to sit EOB with supervision   Standing balance support: Bilateral upper extremity supported;During functional activity Standing balance-Leahy Scale: Poor Standing balance comment: relies on UE support and external assist                            Cognition Arousal/Alertness: Awake/alert Behavior During Therapy: Flat affect Overall Cognitive Status: Impaired/Different  from baseline Area of Impairment: Attention;Awareness;Safety/judgement;Problem solving                   Current Attention Level: Sustained Memory: Decreased short-term memory   Safety/Judgement: Decreased awareness of safety;Decreased awareness of  deficits Awareness: Emergent Problem Solving: Slow processing;Requires verbal cues General Comments: slow processing. Pt did realize that he needed to have a bowel mvmt and verbalized it, but not in enough time to get to bathroom.      Exercises General Exercises - Lower Extremity Long Arc Quad: AROM;Both;10 reps;Seated    General Comments General comments (skin integrity, edema, etc.): pt reports his most comofortable place to be at home for his back is in his recliner. Pt seated in recliner with breakfast after session.      Pertinent Vitals/Pain Pain Assessment: 0-10 Pain Score: 2  Pain Location: back Pain Descriptors / Indicators: Aching;Grimacing;Discomfort Pain Intervention(s): Limited activity within patient's tolerance;Monitored during session    Home Living                      Prior Function            PT Goals (current goals can now be found in the care plan section) Acute Rehab PT Goals Patient Stated Goal: eat PT Goal Formulation: With patient Time For Goal Achievement: 07/15/20 Potential to Achieve Goals: Fair Progress towards PT goals: Progressing toward goals    Frequency    Min 3X/week      PT Plan Current plan remains appropriate    Co-evaluation              AM-PAC PT "6 Clicks" Mobility   Outcome Measure  Help needed turning from your back to your side while in a flat bed without using bedrails?: A Little Help needed moving from lying on your back to sitting on the side of a flat bed without using bedrails?: A Little Help needed moving to and from a bed to a chair (including a wheelchair)?: A Little Help needed standing up from a chair using your arms (e.g., wheelchair or bedside chair)?: A Lot Help needed to walk in hospital room?: A Little Help needed climbing 3-5 steps with a railing? : A Lot 6 Click Score: 16    End of Session Equipment Utilized During Treatment: Gait belt Activity Tolerance: Patient tolerated  treatment well Patient left: with call bell/phone within reach;in chair;with chair alarm set Nurse Communication: Mobility status PT Visit Diagnosis: Unsteadiness on feet (R26.81);Muscle weakness (generalized) (M62.81);History of falling (Z91.81)     Time: 8675-4492 PT Time Calculation (min) (ACUTE ONLY): 24 min  Charges:  $Gait Training: 8-22 mins $Therapeutic Activity: 8-22 mins                     Leighton Roach, PT  Acute Rehab Services  Pager [phone] Office [place] 07/12/2020, 12:35 PM

## 2020-07-12 NOTE — Interval H&P Note (Signed)
History and Physical Interval Note:  07/12/2020 8:28 AM  Cody Douglas  has presented today for surgery, with the diagnosis of STROKE.  The various methods of treatment have been discussed with the patient and family. After consideration of risks, benefits and other options for treatment, the patient has consented to  Procedure(s): TRANSESOPHAGEAL ECHOCARDIOGRAM (TEE) (N/A) as a surgical intervention.  The patient's history has been reviewed, patient examined, no change in status, stable for surgery.  I have reviewed the patient's chart and labs.  Questions were answered to the patient's satisfaction.     Kirk Ruths

## 2020-07-12 NOTE — Anesthesia Procedure Notes (Signed)
Procedure Name: MAC Date/Time: 07/12/2020 8:56 AM Performed by: Colin Benton, CRNA Pre-anesthesia Checklist: Patient identified, Emergency Drugs available, Suction available and Patient being monitored Patient Re-evaluated:Patient Re-evaluated prior to induction Induction Type: IV induction Placement Confirmation: positive ETCO2 Dental Injury: Teeth and Oropharynx as per pre-operative assessment

## 2020-07-12 NOTE — Progress Notes (Addendum)
Occupational Therapy Treatment Patient Details Name: Cody Douglas MRN: [id number] DOB: Jun 27, 1950 Today's Date: 07/12/2020    History of present illness Pt is a 70 y/o male presenting to the ED with slurred speech and facial droop. 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 Pt developed hemoptysis and CT revealed lung mass like lung CA. PMH includes hypertrophic cardiomyopathy, hyponatremia, hypokalemia, chest pain, tobacco use, ETOH abuse,hypertension, prostate cancer.   OT comments  Pt progressing towards established OT goals. Pt performing functional mobility in room with Min A and RW. Pt performing oral care at sink with Min Guard A for safety in standing. Reporting fatigue at LLE and requesting to return to bed after oral care. Pt continue to present with decrease strength, balance, and cognition. Continue to recommend dc to SNF to optimize safety and functional performance. Will continue to follow acutely as admitted.   Follow Up Recommendations  SNF;Supervision/Assistance - 24 hour    Equipment Recommendations  3 in 1 bedside commode    Recommendations for Other Services      Precautions / Restrictions Precautions Precautions: Fall Precaution Comments: Reports hx of falls Restrictions Weight Bearing Restrictions: No       Mobility Bed Mobility Overal bed mobility: Needs Assistance Bed Mobility: Sit to Supine Rolling: Supervision   Supine to sit: Min assist Sit to supine: Min guard   General bed mobility comments: Min GUard A for safety and significant time.    Transfers Overall transfer level: Needs assistance Equipment used: Rolling walker (2 wheeled) Transfers: Sit to/from Stand Sit to Stand: Min assist         General transfer comment: Min A for initating power up and then gaining balance    Balance Overall balance assessment: Needs assistance Sitting-balance support: Bilateral upper  extremity supported;Feet supported Sitting balance-Leahy Scale: Fair Sitting balance - Comments: able to sit EOB with supervision   Standing balance support: Bilateral upper extremity supported;During functional activity Standing balance-Leahy Scale: Poor Standing balance comment: relies on UE support and external assist                           ADL either performed or assessed with clinical judgement   ADL Overall ADL's : Needs assistance/impaired     Grooming: Oral care;Min guard;Standing Grooming Details (indicate cue type and reason): Pt performing oral care while stanidng at sink with MIn Guard A. Reports fatigue and LLE "feeling more tired".                 Toilet Transfer: Minimal assistance;Ambulation;RW (simulated to recliner) Toilet Transfer Details (indicate cue type and reason): Min A for balance and safety         Functional mobility during ADLs: Minimal assistance;Rolling walker General ADL Comments: Pt presenting with decreased balance, strength, cognition, and activity tolerance     Vision       Perception     Praxis      Cognition Arousal/Alertness: Awake/alert Behavior During Therapy: Flat affect Overall Cognitive Status: Impaired/Different from baseline Area of Impairment: Attention;Awareness;Safety/judgement;Problem solving                   Current Attention Level: Sustained Memory: Decreased short-term memory   Safety/Judgement: Decreased awareness of safety;Decreased awareness of deficits Awareness: Emergent Problem Solving: Slow processing;Requires verbal cues General Comments: slow processing. Pt did realize that he needed to have a bowel mvmt and verbalized it,  but not in enough time to get to bathroom.        Exercises    Shoulder Instructions       General Comments pt reports his most comofortable place to be at home for his back is in his recliner. Pt seated in recliner with breakfast after session.     Pertinent Vitals/ Pain       Pain Assessment: 0-10 Pain Score: 2  Pain Location: back Pain Descriptors / Indicators: Aching;Grimacing;Discomfort Pain Intervention(s): Limited activity within patient's tolerance;Monitored during session  Home Living                                          Prior Functioning/Environment              Frequency  Min 2X/week        Progress Toward Goals  OT Goals(current goals can now be found in the care plan section)  Progress towards OT goals: Progressing toward goals  Acute Rehab OT Goals Patient Stated Goal: Go home OT Goal Formulation: With patient/family Time For Goal Achievement: 07/15/20 Potential to Achieve Goals: Good ADL Goals Pt Will Perform Lower Body Bathing: with modified independence;sit to/from stand Pt Will Perform Lower Body Dressing: with modified independence;sit to/from stand Pt Will Transfer to Toilet: with modified independence;ambulating Additional ADL Goal #1: Pt will demonstrate ability to manage his medication list with S Additional ADL Goal #2: Pt will verbalize 3 strategiges to reduce risk of falls  Plan Discharge plan remains appropriate    Co-evaluation                 AM-PAC OT "6 Clicks" Daily Activity     Outcome Measure   Help from another person eating meals?: None Help from another person taking care of personal grooming?: A Little Help from another person toileting, which includes using toliet, bedpan, or urinal?: A Lot Help from another person bathing (including washing, rinsing, drying)?: A Lot Help from another person to put on and taking off regular upper body clothing?: A Little Help from another person to put on and taking off regular lower body clothing?: A Lot 6 Click Score: 16    End of Session Equipment Utilized During Treatment: Gait belt;Rolling walker  OT Visit Diagnosis: Other abnormalities of gait and mobility (R26.89);Unsteadiness on feet  (R26.81);Muscle weakness (generalized) (M62.81);Other symptoms and signs involving cognitive function;Pain Pain - part of body:  (Back)   Activity Tolerance Patient tolerated treatment well   Patient Left in bed;with call bell/phone within reach;with bed alarm set;with nursing/sitter in room   Nurse Communication Mobility status        Time: 3532-9924 OT Time Calculation (min): 17 min  Charges: OT General Charges $OT Visit: 1 Visit OT Treatments $Self Care/Home Management : 8-22 mins  [place], OTR/L Acute Rehab Pager: [phone] Office: [place] 07/12/2020, 3:12 PM

## 2020-07-12 NOTE — Progress Notes (Signed)
I received referral on [name] today.  Due to his hemoptysis I reached out to Dr. Julien Nordmann and he requested rad onc to see patient. Will make referral.

## 2020-07-12 NOTE — Progress Notes (Signed)
PROGRESS NOTE  Cody Douglas UMP:[id number] DOB: [birth year] DOA: 06/30/2020 PCP: Tamsen Roers, MD   LOS: 11 days   Brief Narrative / Interim history: 70 year old male with hypertrophic cardiomyopathy, alcohol and tobacco use, HTN, prostate cancer who came into the hospital with slurred speech and facial droop.  Apparently he continues to drink alcohol.  In the ED underwent brain MRI which showed acute infarction in the external capsule and caudate body on the right without mass-effect or hemorrhage.  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  Subjective / 24h Interval events: Comfortable this morning.  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  He was initially placed on aspirin and Plavix but this has been held due to hemoptysis.  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  Lipid panel showed an LDL of 115, he is on statin.  A1c is 6.3.  SNF has been recommended -Neurology recommended aspirin 325 and Plavix 75 for 3 months then Plavix alone given large vessel high-grade stenosis but now on hold.  Discussed with Dr. Valeta Harms with pulmonology, will resume aspirin and Plavix.  Will discuss with Dr. Erlinda Hong with later today regarding dual antiplatelet therapy and whether aspirin can be done at 30  Active Problems New right hilar mass, complicated with hemoptysis and acute blood loss anemia -hemoptysis has now resolved as he is off antiplatelet therapy.  Pulmonary consulted and following, plans for bronchoscopy on 4/29.  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  Seems to be nonbacterial given lack of WBC, afebrile.  TEE pending this morning  Essential hypertension -continue metoprolol  Tobacco and alcohol abuse -counseled for cessation  Leukocytosis-resolved  Chronic pain-continue as needed oxycodone and Flexeril  Scheduled Meds: . [MAR Hold]  stroke: mapping our early stages of recovery book   Does not apply Once  . [MAR Hold] ALPRAZolam  0.5 mg Oral TID  . [MAR Hold] atorvastatin  40 mg Oral Daily  . [MAR Hold] cyclobenzaprine  5 mg Oral TID  . [MAR Hold] folic acid  1 mg Oral Daily  . [MAR Hold] metoprolol succinate  25 mg Oral Daily  . [MAR Hold] multivitamin  1 tablet Oral Daily  . [MAR Hold] pantoprazole  40 mg Oral Daily  . [MAR Hold] polyethylene glycol  17 g Oral Daily  . [MAR Hold] thiamine  100 mg Oral Daily   Continuous Infusions: PRN Meds:.[MAR Hold] oxyCODONE **AND** [MAR Hold] acetaminophen, [MAR Hold] acetaminophen **OR** [DISCONTINUED] acetaminophen (TYLENOL) oral liquid 160 mg/5 mL **OR** [DISCONTINUED] acetaminophen, [MAR Hold] albuterol, [MAR Hold] guaiFENesin-dextromethorphan, [MAR Hold] senna-docusate  Diet Orders (From admission, onward)    Start     Ordered   07/10/20 1301  Diet Heart Room service appropriate? No; Fluid consistency: Thin  Diet effective 1000       Question Answer Comment  Room service appropriate? No   Fluid consistency: Thin      07/10/20 1300          DVT prophylaxis:      Code Status: Full Code  Family Communication: no family at  bedside, discussed with daughter over the phone  Status is: Inpatient  Remains inpatient appropriate because:Inpatient level of care appropriate due to severity of illness   Dispo: The patient is from: Home              Anticipated d/c is to: SNF              Patient currently is not medically stable to d/c.   Difficult to place patient No  Level of care: Telemetry Medical  Consultants:  Pulmonary Neurology    Procedures:  2D echo:  1. Left ventricular ejection fraction, by estimation, is 55 to 60%. The left ventricle has normal function. The left ventricle has no regional wall motion abnormalities. Left ventricular diastolic parameters are consistent with Grade I diastolic  dysfunction (impaired relaxation).  2. Right ventricular systolic function is normal. The right ventricular size is normal. There is normal pulmonary artery systolic pressure.  3. Severe calcified bulky lesions on posterior and anterior leaflets Despite calcification edges/borders seem irregular and favor vegetations rather than MAC Suggest TEE to further evaluate. The mitral valve is abnormal. Mild mitral valve regurgitation. No evidence of mitral stenosis.  4. Severe bulky calcification/ mass particularly on the Non coronary cusp and commisure between the right and non cusps. Again despite calcification edges irregular and suggest vegetations TEE and cardiology consult suggested . The aortic valve is abnormal. Aortic valve regurgitation is mild. No aortic stenosis is  present.  5. The inferior vena cava is normal in size with greater than 50% respiratory variability, suggesting right atrial pressure of 3 mmHg.   Microbiology  none  Antimicrobials: None     Objective: Vitals:   07/12/20 0033 07/12/20 0426 07/12/20 0759 07/12/20 0830  BP: 123/72 114/71 133/71 132/70  Pulse: 69 71 70 71  Resp: _0 (!) 22  Temp: 97.6 F (36.4 C) 97.6 F (36.4 C) 97.6 F (36.4 C) (!) 97.5 F (36.4 C)  TempSrc: Oral Oral Oral Oral  SpO2: 96% 98% 97% 96%  Weight:      Height:        Intake/Output Summary (Last 24 hours) at 07/12/2020 0915 Last data filed at 07/12/2020 0600 Gross per 24 hour  Intake --  Output 1100 ml  Net -1100 ml   Filed Weights   07/05/20 0837  Weight: 72.6 kg    Examination:  Constitutional: NAD Eyes: lids and conjunctivae normal, no scleral icterus ENMT: mmm Neck: normal,  supple Respiratory: clear to auscultation bilaterally, no wheezing, no crackles. Normal respiratory effort.  Cardiovascular: Regular rate and rhythm, no murmurs / rubs / gallops. No LE edema. Abdomen: soft, no distention, no tenderness. Bowel sounds positive.  Skin: no rashes  Data Reviewed: I have independently reviewed following labs and imaging studies   CBC: Recent Labs  Lab 07/06/20 0333 07/07/20 0224 07/08/20 0253 07/11/20 0156  WBC  --   --  10.3 8.2  HGB 12.2* 12.6* 13.0 11.9*  HCT 37.2* 38.7* 39.1 37.0*  MCV  --   --  93.8 95.4  PLT  --   --  322 269   Basic Metabolic Panel: Recent Labs  Lab 07/08/20 0253 07/11/20 0156  NA 133* 136  K 3.8 3.9  CL 103 100  CO2 22 29  GLUCOSE 122* 114*  BUN 14 12  CREATININE 0.57* 0.68  CALCIUM 8.8* 8.9   Liver Function Tests: Recent Labs  Lab 07/08/20 0253  AST 18  ALT 17  ALKPHOS 84  BILITOT  0.6  PROT 6.2*  ALBUMIN 3.2*   Coagulation Profile: No results for input(s): INR, PROTIME in the last 168 hours. HbA1C: No results for input(s): HGBA1C in the last 72 hours. CBG: No results for input(s): GLUCAP in the last 168 hours.  No results found for this or any previous visit (from the past 240 hour(s)).   Radiology Studies: No results found.  Marzetta Board, MD, PhD [facility]  Between 7 am - 7 pm I am available, please contact me via Amion (for emergencies) or Securechat (non urgent messages)  Between 7 pm - 7 am I am not available, please contact night coverage MD/APP via Amion

## 2020-07-12 NOTE — Transfer of Care (Signed)
Immediate Anesthesia Transfer of Care Note  Patient: Cody Douglas  Procedure(s) Performed: TRANSESOPHAGEAL ECHOCARDIOGRAM (TEE) (N/A )  Patient Location: Endoscopy Unit  Anesthesia Type:MAC  Level of Consciousness: drowsy  Airway & Oxygen Therapy: Patient Spontanous Breathing and Patient connected to nasal cannula oxygen  Post-op Assessment: Report given to RN and Post -op Vital signs reviewed and stable  Post vital signs: Reviewed and stable  Last Vitals:  Vitals Value Taken Time  BP    Temp    Pulse 72 07/12/20 0921  Resp 25 07/12/20 0921  SpO2 97 % 07/12/20 0921  Vitals shown include unvalidated device data.  Last Pain:  Vitals:   07/12/20 0830  TempSrc: Oral  PainSc: 0-No pain      Patients Stated Pain Goal: 3 (44/03/47 4259)  Complications: No complications documented.

## 2020-07-12 NOTE — Anesthesia Preprocedure Evaluation (Signed)
Anesthesia Evaluation  Patient identified by MRN, date of birth, ID band Patient awake    Reviewed: Allergy & Precautions, NPO status , Patient's Chart, lab work & pertinent test results  History of Anesthesia Complications Negative for: history of anesthetic complications  Airway Mallampati: III  TM Distance: >3 FB Neck ROM: Full    Dental  (+) Edentulous Upper, Edentulous Lower   Pulmonary shortness of breath, Current Smoker and Patient abstained from smoking.,   Right hilar mass with hemoptysis    Pulmonary exam normal        Cardiovascular hypertension, Pt. on medications and Pt. on home beta blockers Normal cardiovascular exam+ Valvular Problems/Murmurs    '22 TTE - EF 55 to 60%. Grade I diastolic dysfunction (impaired relaxation). Severe calcified bulky lesions on posterior and anterior MV leaflets. Despite calcification edges/borders seem irregular and favor vegetations  rather than MAC. Mild MR. Severe bulky calcification/ mass particularly on the Non coronary cusp and commisure between the right and non cusps. Again despite calcification edges irregular and suggest vegetations. Mild AI.    Neuro/Psych  Headaches, PSYCHIATRIC DISORDERS Anxiety CVA, Residual Symptoms    GI/Hepatic GERD  Medicated and Controlled,(+)     substance abuse  alcohol use,   Endo/Other  negative endocrine ROS  Renal/GU negative Renal ROS    Prostate cancer     Musculoskeletal negative musculoskeletal ROS (+)   Abdominal   Peds  Hematology  (+) anemia ,   Anesthesia Other Findings STROKE  Reproductive/Obstetrics                             Anesthesia Physical  Anesthesia Plan  ASA: III  Anesthesia Plan: MAC   Post-op Pain Management:    Induction: Intravenous  PONV Risk Score and Plan: 1 and Treatment may vary due to age or medical condition and Propofol infusion  Airway Management Planned:  Nasal Cannula  Additional Equipment:   Intra-op Plan:   Post-operative Plan:   Informed Consent: I have reviewed the patients History and Physical, chart, labs and discussed the procedure including the risks, benefits and alternatives for the proposed anesthesia with the patient or authorized representative who has indicated his/her understanding and acceptance.       Plan Discussed with: CRNA  Anesthesia Plan Comments:         Anesthesia Quick Evaluation

## 2020-07-13 ENCOUNTER — Ambulatory Visit
Admit: 2020-07-13 | Discharge: 2020-07-13 | Disposition: A | Discharge status: Home / Self Care | Payer: Medicare Other | Attending: Radiation Oncology | Admitting: Radiation Oncology

## 2020-07-13 ENCOUNTER — Encounter (HOSPITAL_COMMUNITY): Payer: Self-pay | Admitting: Internal Medicine

## 2020-07-13 DIAGNOSIS — I10 Essential (primary) hypertension: Secondary | ICD-10-CM | POA: Diagnosis not present

## 2020-07-13 DIAGNOSIS — F1010 Alcohol abuse, uncomplicated: Secondary | ICD-10-CM | POA: Diagnosis not present

## 2020-07-13 DIAGNOSIS — I639 Cerebral infarction, unspecified: Secondary | ICD-10-CM | POA: Diagnosis not present

## 2020-07-13 DIAGNOSIS — C3411 Malignant neoplasm of upper lobe, right bronchus or lung: Secondary | ICD-10-CM

## 2020-07-13 LAB — URINALYSIS, MICROSCOPIC (REFLEX): RBC / HPF: >50 RBC/hpf (ref 0–5)

## 2020-07-13 LAB — URINALYSIS, ROUTINE W REFLEX MICROSCOPIC

## 2020-07-13 MED ORDER — ASPIRIN 81 MG PO CHEW
81.0000 mg | CHEWABLE_TABLET | Freq: Every day | ORAL | Status: DC
  Administered 2020-07-14 – 2020-07-15 (×2): 81 mg via ORAL
  Filled 2020-07-13 (×2): qty 1

## 2020-07-13 MED ORDER — ASPIRIN 81 MG PO CHEW: 81.0000 mg | CHEWABLE_TABLET | Freq: Every day | ORAL | Status: DC

## 2020-07-13 NOTE — Consult Note (Signed)
NAME:  Cody Douglas, MRN:  [id number], DOB:  1950/11/28, LOS: 68 ADMISSION DATE:  06/30/2020, CONSULTATION DATE: 07/06/2020 REFERRING MD: Dr. Cathlean Sauer, CHIEF COMPLAINT: Lung mass  History of Present Illness:  This is a 70 year old gentleman past medical history of longstanding tobacco abuse since age 8 smoked until the day he was admitted to the hospital, history of CVA, hypertension prostate cancer.  Patient admitted to the hospital with complaints of hemoptysis.  Had a CT scan of the head with acute ischemic infarct and a right external capsule and caudate body CVA.  With the complaint of hemoptysis he underwent a CT scan of the chest CT of the chest on 07/05/2020 revealed a right hilar mass measuring 2.3 x 2.5 cm with an additional associated mass at the right mainstem bronchus and trachea at 3.9 x 4.3 cm.  Pertinent  Medical History   Past Medical History:  Diagnosis Date  . Anxiety   . Dyspnea    with exertion - has inhaler  . Frequent headaches   . GERD (gastroesophageal reflux disease)   . Hypertension   . Prostate cancer ([place])      Significant Hospital Events: Including procedures, antibiotic start and stop dates in addition to other pertinent events   . 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 4/29 Bronchosopy with brushings, cryo biopsies and BAL washings . 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 He denies cough or hemoptysis.  He has spoken with oncology about follow up and his new diagnosis of lung cancer.  Objective   Blood pressure (!) 147/85, pulse 91, temperature 97.6 F (36.4 C), temperature source Oral, resp. rate 18, height _0  (1.803 m), weight 72.6 kg, SpO2 98 %.        Intake/Output Summary (Last 24 hours) at 07/13/2020 1044 Last data filed at 07/13/2020 0723 Gross per 24 hour  Intake --  Output 700 ml  Net -700 ml   Filed  Weights   07/05/20 0837  Weight: 72.6 kg    Examination: General: Chronically ill-appearing gentleman, resting in bed HENT: [place]/AT, sclera anicteric, moist mucous membranes Lungs: clear to auscultation. No wheezing. Cardiovascular: Regular rhythm S1-S2. No murmurs Abdomen: Soft nontender nondistended, bowel sounds present Extremities: warm, no edema Neuro: Alert oriented following commands GU: Deferred  CT imaging 07/05/2020: RUL mass concerning for malignancy  I reviewed in the room with patient and his daughter The patient's images have been independently reviewed by me.    Labs/imaging that I havepersonally reviewed  (right click and "Reselect all SmartList Selections" daily)  CT Chest 07/05/20 CT abdomen/pelvis 07/06/20  Resolved Hospital Problem list     Assessment & Plan:   Right upper lobe hilar mass with associated adenopathy consistent with squamous cell carcinoma oncerning for an advanced age bronchogenic carcinoma. Acute right ischemic CVA, external capsule, caudate body, holding dual antiplatelet therapy due to hemoptysis Acute hemoptysis secondary to malignancy concern as above. History of hypertension longstanding tobacco abuse Longstanding alcohol abuse  Plan: - Reviewed patient's diagnosis with him and he had spoken with Oncology over the phone yesterday. All questions were answered.  - Will notify Dr. Valeta Harms about the results and if he would like to schedule follow up in the pulmonary clinic - Nothing further from a pulmonary perspective while he is inpatient and no further sings of hemoptysis at this time.  - We will  sign off but please call us if there are any further questions.     Labs   CBC: Recent Labs  Lab 07/07/20 0224 07/08/20 0253 07/11/20 0156  WBC  --  10.3 8.2  HGB 12.6* 13.0 11.9*  HCT 38.7* 39.1 37.0*  MCV  --  93.8 95.4  PLT  --  322 638    Basic Metabolic Panel: Recent Labs  Lab 07/08/20 0253 07/11/20 0156  NA 133* 136  K  3.8 3.9  CL 103 100  CO2 22 29  GLUCOSE 122* 114*  BUN 14 12  CREATININE 0.57* 0.68  CALCIUM 8.8* 8.9   GFR: Estimated Creatinine Clearance: 87 mL/min (by C-G formula based on SCr of 0.68 mg/dL). Recent Labs  Lab 07/08/20 0253 07/11/20 0156  WBC 10.3 8.2    Liver Function Tests: Recent Labs  Lab 07/08/20 0253  AST 18  ALT 17  ALKPHOS 84  BILITOT 0.6  PROT 6.2*  ALBUMIN 3.2*   No results for input(s): LIPASE, AMYLASE in the last 168 hours. No results for input(s): AMMONIA in the last 168 hours.  ABG    Component Value Date/Time   TCO2 25 06/30/2020 1938     Coagulation Profile: No results for input(s): INR, PROTIME in the last 168 hours.  Cardiac Enzymes: No results for input(s): CKTOTAL, CKMB, CKMBINDEX, TROPONINI in the last 168 hours.  HbA1C: Hgb A1c MFr Bld  Date/Time Value Ref Range Status  07/01/2020 01:52 AM 6.3 (H) 4.8 - 5.6 % Final    Comment:    (NOTE) Pre diabetes:          5.7%-6.4%  Diabetes:              >6.4%  Glycemic control for   <7.0% adults with diabetes   12/13/2013 08:06 AM 5.7 (H) <5.7 % Final    Comment:    (NOTE)                                                                       According to the ADA Clinical Practice Recommendations for 2011, when HbA1c is used as a screening test:  >=6.5%   Diagnostic of Diabetes Mellitus           (if abnormal result is confirmed) 5.7-6.4%   Increased risk of developing Diabetes Mellitus References:Diagnosis and Classification of Diabetes Mellitus,Diabetes GTXM,4680,32(ZYYQM 1):S62-S69 and Standards of Medical Care in         Diabetes - 2011,Diabetes GNOI,3704,88 (Suppl 1):S11-S61.    CBG: No results for input(s): GLUCAP in the last 168 hours.    Freddi Starr, MD [facility] 07/13/2020 10:44 AM

## 2020-07-13 NOTE — Consult Note (Signed)
Radiation Oncology         [phone] ________________________________  Name: Cody Douglas        MRN: [id number]  Date of Service: 07/13/20 DOB: 10-04-1950  UM:PNTIRW, Jeneen Rinks, MD    REFERRING PHYSICIAN: Dr. Julien Nordmann  DIAGNOSIS: The primary encounter diagnosis was Acute CVA (cerebrovascular accident) [facility]). Diagnoses of Alcohol abuse, Dyspnea, and Surgery, elective were also pertinent to this visit.   HISTORY OF PRESENT ILLNESS: Cody Douglas is a 71 y.o. male seen at the request of Dr. Julien Nordmann for a newly diagnosed lung cancer. The patient presented with an acute stroke and during his work up noted hemoptysis. A CT of the chest on 07/05/20 showed a 1.4 cm mass in the RUL, as well as a right hilar mass measuring 2.5 cm in greatest dimension as well as subcarinal adenopathy measuring up to 4.3 cm. He underwent bronchoscopy with EUS with Dr. 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 During the procedure Dr. Valeta Harms noted visible tumor into the right mainstem with 75% occlusion. Given the findings he's seen to consider radiotherapy for his new lung cancer diagnosis. He's on aspirin currently but his anticoagulation cannot be optimized due to his hemoptysis. That being said, his Hgb has been fluctuating between 14.2 and 11.9 today.    PREVIOUS RADIATION THERAPY: No   PAST MEDICAL HISTORY:  Past Medical History:  Diagnosis Date  . Anxiety   . Dyspnea    with exertion - has inhaler  . Frequent headaches   . GERD (gastroesophageal reflux disease)   . Hypertension   . Prostate cancer ([place])        PAST SURGICAL HISTORY: Past Surgical History:  Procedure Laterality Date  . BRONCHIAL BRUSHINGS  07/09/2020   Procedure: BRONCHIAL BRUSHINGS;  Surgeon: Garner Nash, DO;  Location: [place];  Service: Pulmonary;;  . BRONCHIAL WASHINGS  07/09/2020   Procedure: BRONCHIAL WASHINGS;  Surgeon: Garner Nash, DO;  Location: [place];  Service: Pulmonary;;  . CRYOTHERAPY  07/09/2020   Procedure: CRYOTHERAPY;  Surgeon: Garner Nash, DO;  Location: [facility];  Service: Pulmonary;;  . EYE SURGERY    . HEMOSTASIS CONTROL  07/09/2020   Procedure: HEMOSTASIS CONTROL;  Surgeon: Garner Nash, DO;  Location: [facility];  Service: Pulmonary;;  . PROSTATE BIOPSY     Prostate surgery 8-9 yrs ago  . VIDEO BRONCHOSCOPY WITH ENDOBRONCHIAL ULTRASOUND Bilateral 07/09/2020   Procedure: VIDEO BRONCHOSCOPY WITH ENDOBRONCHIAL ULTRASOUND;  Surgeon: Garner Nash, DO;  Location: [facility];  Service: Pulmonary;  Laterality: Bilateral;     FAMILY HISTORY:  Family History  Problem Relation Age of Onset  . Cancer - Lung Father   . Heart disease Brother   . Diabetes type II Neg Hx   . Stroke Neg Hx      SOCIAL HISTORY:  reports that he has been smoking cigarettes. He has a 100.00 pack-year smoking history. He has never used smokeless tobacco. He reports current alcohol use of about 1.0 standard drink of alcohol per week. He reports that he does not use drugs. The patient is widowed and lives in [place]. He is retired from working as a Recruitment consultant.    ALLERGIES: Patient has no known allergies.   MEDICATIONS:  Current Facility-Administered Medications  Medication Dose Route Frequency Provider Last Rate Last Admin  .  stroke: mapping our early stages of recovery book   Does  not apply Once Lelon Perla, MD      . oxyCODONE (Oxy IR/ROXICODONE) immediate release tablet 10 mg  10 mg Oral Q6H PRN Lelon Perla, MD   10 mg at 07/11/20 1612   And  . acetaminophen (TYLENOL) tablet 325 mg  325 mg Oral Q6H PRN Lelon Perla, MD      . acetaminophen (TYLENOL) tablet 650 mg  650 mg Oral Q4H PRN Lelon Perla, MD   650 mg at 07/12/20 2136  . albuterol (VENTOLIN HFA) 108 (90 Base) MCG/ACT inhaler 2 puff  2 puff Inhalation Q6H PRN Lelon Perla, MD      . ALPRAZolam  Duanne Moron) tablet 0.5 mg  0.5 mg Oral TID Lelon Perla, MD   0.5 mg at 07/12/20 2136  . aspirin tablet 325 mg  325 mg Oral Daily Caren Griffins, MD   325 mg at 07/12/20 1151  . atorvastatin (LIPITOR) tablet 40 mg  40 mg Oral Daily Lelon Perla, MD   40 mg at 07/12/20 1025  . cyclobenzaprine (FLEXERIL) tablet 5 mg  5 mg Oral TID Lelon Perla, MD   5 mg at 07/12/20 2136  . folic acid (FOLVITE) tablet 1 mg  1 mg Oral Daily Lelon Perla, MD   1 mg at 07/12/20 1025  . guaiFENesin-dextromethorphan (ROBITUSSIN DM) 100-10 MG/5ML syrup 5 mL  5 mL Oral Q4H PRN Lelon Perla, MD   5 mL at 07/04/20 1503  . metoprolol succinate (TOPROL-XL) 24 hr tablet 25 mg  25 mg Oral Daily Lelon Perla, MD   25 mg at 07/12/20 1025  . multivitamin (PROSIGHT) tablet 1 tablet  1 tablet Oral Daily Lelon Perla, MD   1 tablet at 07/12/20 1029  . pantoprazole (PROTONIX) EC tablet 40 mg  40 mg Oral Daily Lelon Perla, MD   40 mg at 07/12/20 1025  . polyethylene glycol (MIRALAX / GLYCOLAX) packet 17 g  17 g Oral Daily Lelon Perla, MD   17 g at 07/08/20 1006  . senna-docusate (Senokot-S) tablet 1 tablet  1 tablet Oral QHS PRN Lelon Perla, MD   1 tablet at 07/09/20 2228  . thiamine tablet 100 mg  100 mg Oral Daily Lelon Perla, MD   100 mg at 07/12/20 1025     REVIEW OF SYSTEMS: On review of systems, the patient reports that he is doing pretty well. He feels his speech is better. He reports about 10 pounds of weight loss. His cough has improved and he reports no more bleeding but had this off and on for several weeks at home. He denies any shortness of breath or chest pain at this time. He has been weak since his stroke but is working with PT and is using a walker to get around. No other complaints are verbalized.  PHYSICAL EXAM:  Wt Readings from Last 3 Encounters:  07/05/20 160 lb (72.6 kg)  12/14/13 170 lb (77.1 kg)   Temp Readings from Last 3 Encounters:  07/13/20 97.7  F (36.5 C) (Oral)  09/12/19 98.6 F (37 C) (Oral)  12/14/13 98.6 F (37 C)   BP Readings from Last 3 Encounters:  07/13/20 140/67  09/13/19 (!) 142/80  12/14/13 (!) 144/79   Pulse Readings from Last 3 Encounters:  07/13/20 73  09/13/19 72  12/14/13 81   Pain Assessment Pain Score: 5 /10  Unable to assess due to encounter type.   ECOG = 2  0 - Asymptomatic (Fully active, able to carry on all predisease activities without restriction)  1 - Symptomatic but completely ambulatory (Restricted in physically strenuous activity but ambulatory and able to carry out work of a light or sedentary nature. For example, light housework, office work)  2 - Symptomatic, <50% in bed during the day (Ambulatory and capable of all self care but unable to carry out any work activities. Up and about more than 50% of waking hours)  3 - Symptomatic, >50% in bed, but not bedbound (Capable of only limited self-care, confined to bed or chair 50% or more of waking hours)  4 - Bedbound (Completely disabled. Cannot carry on any self-care. Totally confined to bed or chair)  5 - Death   Eustace Pen MM, Creech RH, Tormey DC, et al. [phone]). "Toxicity and response criteria of the [facility]". [place] Oncol. 5 (6): 649-55    LABORATORY DATA:  Lab Results  Component Value Date   WBC 8.2 07/11/2020   HGB 11.9 (L) 07/11/2020   HCT 37.0 (L) 07/11/2020   MCV 95.4 07/11/2020   PLT 354 07/11/2020   Lab Results  Component Value Date   NA 136 07/11/2020   K 3.9 07/11/2020   CL 100 07/11/2020   CO2 29 07/11/2020   Lab Results  Component Value Date   ALT 17 07/08/2020   AST 18 07/08/2020   ALKPHOS 84 07/08/2020   BILITOT 0.6 07/08/2020      RADIOGRAPHY: DG Chest 1 View  Result Date: 07/04/2020 CLINICAL DATA:  Shortness of breath. EXAM: CHEST  1 VIEW COMPARISON:  September 12, 2019 FINDINGS: Platelike opacity extending from the right hilum peripherally, similar since September 12, 2019  but new compared to 2015. Mild fullness in the right hilar region. Left lung is clear. No pneumothorax. No other acute abnormalities. IMPRESSION: Platelike opacity extending from the right hilum peripherally with some fullness in the right hilar region. Recommend CT imaging for better evaluation. These results will be called to the ordering clinician or representative by the Radiologist Assistant, and communication documented in the PACS or [employer]. Electronically Signed   By: Dorise Bullion III M.D   On: 07/04/2020 15:03   CT CHEST WO CONTRAST  Result Date: 07/05/2020 CLINICAL DATA:  Hemoptysis. EXAM: CT CHEST WITHOUT CONTRAST TECHNIQUE: Multidetector CT imaging of the chest was performed following the standard protocol without IV contrast. COMPARISON:  Plain film of the chest 07/05/2023. CT chest 12/13/2013. FINDINGS: Cardiovascular: No significant vascular findings. Normal heart size. No pericardial effusion. Calcific aortic and coronary atherosclerosis noted. Mediastinum/Nodes: A right hilar mass abutting the superior aspect of the right mainstem bronchus and trachea measures 3.9 cm AP x 4.3 cm transverse on image 67 of series 3 x 5.2 cm craniocaudal on coronal image 45, series 5. Amorphous calcifications are present within the lesion. The thyroid gland and esophagus appear normal. Lungs/Pleura: Reticulonodular opacities are seen in the right upper lobe. A nodule along the major fissure in the right upper lobe measures 1.4 cm transverse by 0.6 cm AP on image 39 of series 4. An irregularly-shaped right upper lobe nodule measures 2.3 x 2.5 cm on image 42 of series 4. There is also some discoid atelectasis in the anterior right upper lobe which is likely postobstructive. Mild dependent atelectasis is seen in the left lung base. There is also some peribronchial thickening in the lower lobes bilaterally. Upper Abdomen: Negative. Musculoskeletal: No lytic or sclerotic lesion. Convex right scoliosis and  multilevel degenerative disease noted. IMPRESSION: Right hilar mass consistent with carcinoma. 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 Reticulonodular opacities in the right upper lobe, a subpleural nodule along the major fissure and nodule in the upper lobe are most consistent with tumor spread. Aortic Atherosclerosis (ICD10-I70.0). Calcific coronary artery disease also noted. Electronically Signed   By: Inge Rise M.D.   On: 07/05/2020 12:01   MR ANGIO HEAD WO CONTRAST  Result Date: 07/01/2020 CLINICAL DATA:  Stroke EXAM: MRA HEAD WITHOUT CONTRAST TECHNIQUE: Angiographic images of the Circle of Willis were obtained using MRA technique without intravenous contrast. COMPARISON:  MRI head 06/30/2020 FINDINGS: Internal carotid artery widely patent and normal bilaterally. Severe stenosis right M1 segment. There appears to be associated filling defect. This could be atherosclerotic disease and or thrombus. This extends to the right MCA bifurcation. Right M2 branches patent with decreased caliber compared to the left. Fenestrated left M1 segment. Left MCA widely patent. Both anterior cerebral arteries widely patent. Basilar widely patent. PICA patent bilaterally. Both vertebral arteries widely patent. Posterior cerebral arteries widely patent with fetal origin on the right. IMPRESSION: Severe stenosis right M1 segment. Associated filling defect may represent thrombus and or atherosclerotic plaque. Decreased perfusion right MCA branches which remains patent. No other significant cranial stenosis Code stroke imaging results were communicated on 07/01/2020 at 2:11 pm to provider Erlinda Hong via text page Electronically Signed   By: Franchot Gallo M.D.   On: 07/01/2020 14:13   MR BRAIN WO CONTRAST  Result Date: 06/30/2020 CLINICAL DATA:  Transient ischemic attack. EXAM: MRI HEAD WITHOUT CONTRAST TECHNIQUE: Multiplanar, multiecho pulse sequences of the brain and surrounding  structures were obtained without intravenous contrast. COMPARISON:  None. FINDINGS: Brain: Diffusion imaging shows acute infarction within the external capsule and caudate body on the right. No evidence of mass effect or hemorrhage. No other acute insult. Elsewhere, the brainstem is normal. Old small vessel cerebellar infarctions inferior on the left. 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 No sign of mass lesion, hemorrhage, hydrocephalus or extra-axial collection. Vascular: Major vessels at the base of the brain show flow. Skull and upper cervical spine: Negative Sinuses/Orbits: Clear/normal Other: None IMPRESSION: 1. Acute infarction affecting the external capsule and caudate body on the right. No mass effect or hemorrhage. 2. 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 Electronically Signed   By: Nelson Chimes M.D.   On: 06/30/2020 20:30   CT ABDOMEN PELVIS W CONTRAST  Result Date: 07/06/2020 CLINICAL DATA:  Lung cancer.  Novel diagnosis.  Staging examination. EXAM: CT ABDOMEN AND PELVIS WITH CONTRAST TECHNIQUE: Multidetector CT imaging of the abdomen and pelvis was performed using the standard protocol following bolus administration of intravenous contrast. CONTRAST:  168m OMNIPAQUE IOHEXOL 300 MG/ML  SOLN COMPARISON:  06/23/2004 FINDINGS: Lower chest: Minimal left basilar atelectasis. Mild coronary artery calcification. Hepatobiliary: No focal liver abnormality is seen. No gallstones, gallbladder wall thickening, or biliary dilatation. Pancreas: Unremarkable Spleen: Unremarkable Adrenals/Urinary Tract: The adrenal glands are unremarkable. The kidneys are normal in size and position. Scattered areas of renal cortical atrophy have developed bilaterally,, more prevalent within the lower poles bilaterally. No enhancing intrarenal masses. No intrarenal or ureteral calcifications. No hydronephrosis. The bladder is unremarkable. Stomach/Bowel: Moderate sigmoid diverticulosis. The stomach,  small bowel, and large bowel are otherwise unremarkable. Appendix normal. No free intraperitoneal gas or fluid. Vascular/Lymphatic: Extensive aortoiliac atherosclerotic calcification. Bilobed fusiform infrarenal abdominal aortic aneurysm has developed with maximal transaxial dimensions of 3.2 x 3.0  cm (coronal image # 86/sagittal image # 102). No pathologic adenopathy within the abdomen and pelvis. Reproductive: Prostate is unremarkable. Other: Tiny fat containing umbilical hernia.  Rectum unremarkable. Musculoskeletal: No lytic or blastic bone lesions are identified. Degenerative changes are noted throughout the visualized thoracolumbar spine. No acute bone abnormality. IMPRESSION: No evidence of metastatic disease within the abdomen and pelvis. Interval development of 3.2 cm infrarenal abdominal aortic aneurysm Recommend follow-up ultrasound every 3 years. This recommendation follows ACR consensus guidelines: White Paper of the ACR Incidental Findings Committee II on Vascular Findings. J Am Coll Radiol 2013; 10:789-794. Moderate sigmoid diverticulosis. Aortic aneurysm NOS (ICD10-I71.9). Aortic Atherosclerosis (ICD10-I70.0). Electronically Signed   By: Fidela Salisbury MD   On: 07/06/2020 00:43   ECHOCARDIOGRAM COMPLETE  Result Date: 07/01/2020    ECHOCARDIOGRAM REPORT   Patient Name:   JATAVIS MALEK Date of Exam: 07/01/2020 Medical Rec #:  [id number]      Height:       71.0 in Accession #:    [id number]     Weight:       170.0 lb Date of Birth:  1950/12/25      BSA:          1.968 m Patient Age:    40 years       BP:           125/79 mmHg Patient Gender: M              HR:           77 bpm. Exam Location:  Inpatient Procedure: 2D Echo, Cardiac Doppler and Color Doppler Indications:    CVA  History:        Patient has no prior history of Echocardiogram examinations.                 Risk Factors:Hypertension.  Sonographer:    Luisa Hart RDCS Referring Phys: [id number] [place] Comments: No  cardiac surgery or procedures noted in chart IMPRESSIONS  1. Left ventricular ejection fraction, by estimation, is 55 to 60%. The left ventricle has normal function. The left ventricle has no regional wall motion abnormalities. Left ventricular diastolic parameters are consistent with Grade I diastolic dysfunction (impaired relaxation).  2. Right ventricular systolic function is normal. The right ventricular size is normal. There is normal pulmonary artery systolic pressure.  3. Severe calcified bulky lesions on posterior and anterior leaflets Despite calcification edges/borders seem irregular and favor vegetations rather than MAC Suggest TEE to further evaluate. The mitral valve is abnormal. Mild mitral valve regurgitation.  No evidence of mitral stenosis.  4. Severe bulky calcification/ mass particularly on the Non coronary cusp and commisure between the right and non cusps. Again despite calcification edges irregular and suggest vegetations TEE and cardiology consult suggested . The aortic valve is abnormal. Aortic valve regurgitation is mild. No aortic stenosis is present.  5. The inferior vena cava is normal in size with greater than 50% respiratory variability, suggesting right atrial pressure of 3 mmHg. FINDINGS  Left Ventricle: Left ventricular ejection fraction, by estimation, is 55 to 60%. The left ventricle has normal function. The left ventricle has no regional wall motion abnormalities. The left ventricular internal cavity size was normal in size. There is  no left ventricular hypertrophy. Left ventricular diastolic parameters are consistent with Grade I diastolic dysfunction (impaired relaxation). Right Ventricle: The right ventricular size is normal. No increase in right ventricular wall thickness. Right ventricular systolic function is normal.  There is normal pulmonary artery systolic pressure. The tricuspid regurgitant velocity is 2.56 m/s, and  with an assumed right atrial pressure of 3 mmHg, the  estimated right ventricular systolic pressure is 37.3 mmHg. Left Atrium: Left atrial size was normal in size. Right Atrium: Right atrial size was normal in size. Pericardium: There is no evidence of pericardial effusion. Mitral Valve: Severe calcified bulky lesions on posterior and anterior leaflets Despite calcification edges/borders seem irregular and favor vegetations rather than MAC Suggest TEE to further evaluate. The mitral valve is abnormal. Mild mitral valve regurgitation. No evidence of mitral valve stenosis. MV peak gradient, 8.1 mmHg. The mean mitral valve gradient is 4.0 mmHg. Tricuspid Valve: The tricuspid valve is normal in structure. Tricuspid valve regurgitation is not demonstrated. No evidence of tricuspid stenosis. Aortic Valve: Severe bulky calcification/ mass particularly on the Non coronary cusp and commisure between the right and non cusps. Again despite calcification edges irregular and suggest vegetations TEE and cardiology consult suggested. The aortic valve  is abnormal. Aortic valve regurgitation is mild. Aortic regurgitation PHT measures 385 msec. No aortic stenosis is present. Aortic valve mean gradient measures 9.5 mmHg. Aortic valve peak gradient measures 15.9 mmHg. Aortic valve area, by VTI measures 5.87 cm. Pulmonic Valve: The pulmonic valve was normal in structure. Pulmonic valve regurgitation is not visualized. No evidence of pulmonic stenosis. Aorta: The aortic root is normal in size and structure. Venous: The inferior vena cava is normal in size with greater than 50% respiratory variability, suggesting right atrial pressure of 3 mmHg. IAS/Shunts: No atrial level shunt detected by color flow Doppler.  LEFT VENTRICLE PLAX 2D LVIDd:         5.20 cm     Diastology LVIDs:         4.00 cm     LV e' medial:    4.35 cm/s LV PW:         0.70 cm     LV E/e' medial:  21.0 LV IVS:        1.00 cm     LV e' lateral:   7.62 cm/s LVOT diam:     2.70 cm     LV E/e' lateral: 12.0 LV SV:          226 LV SV Index:   115 LVOT Area:     5.73 cm  LV Volumes (MOD) LV vol d, MOD A2C: 79.2 ml LV vol d, MOD A4C: 56.4 ml LV vol s, MOD A2C: 29.7 ml LV vol s, MOD A4C: 21.6 ml LV SV MOD A2C:     49.5 ml LV SV MOD A4C:     56.4 ml LV SV MOD BP:      48.8 ml RIGHT VENTRICLE RV S prime:     12.10 cm/s TAPSE (M-mode): 1.5 cm LEFT ATRIUM             Index       RIGHT ATRIUM           Index LA diam:        3.60 cm 1.83 cm/m  RA Area:     13.80 cm LA Vol (A2C):   31.8 ml 16.16 ml/m RA Volume:   32.60 ml  16.57 ml/m LA Vol (A4C):   46.7 ml 23.73 ml/m LA Biplane Vol: 39.5 ml 20.07 ml/m  AORTIC VALVE                    PULMONIC VALVE AV Area (Vmax):  5.85 cm     PV Vmax:       0.66 m/s AV Area (Vmean):   5.39 cm     PV Peak grad:  1.8 mmHg AV Area (VTI):     5.87 cm AV Vmax:           199.50 cm/s AV Vmean:          143.500 cm/s AV VTI:            0.384 m AV Peak Grad:      15.9 mmHg AV Mean Grad:      9.5 mmHg LVOT Vmax:         204.00 cm/s LVOT Vmean:        135.000 cm/s LVOT VTI:          0.394 m LVOT/AV VTI ratio: 1.03 AI PHT:            385 msec  AORTA Ao Root diam: 4.00 cm MITRAL VALVE                TRICUSPID VALVE MV Area (PHT): 2.09 cm     TR Peak grad:   26.2 mmHg MV Area VTI:   5.32 cm     TR Vmax:        256.00 cm/s MV Peak grad:  8.1 mmHg MV Mean grad:  4.0 mmHg     SHUNTS MV Vmax:       1.42 m/s     Systemic VTI:  0.39 m MV Vmean:      90.8 cm/s    Systemic Diam: 2.70 cm MV Decel Time: 363 msec MR Peak grad: 94.5 mmHg MR Vmax:      486.00 cm/s MV E velocity: 91.30 cm/s MV A velocity: 145.00 cm/s MV E/A ratio:  0.63 Jenkins Rouge MD Electronically signed by Jenkins Rouge MD Signature Date/Time: 07/01/2020/10:07:46 AM    Final    ECHO TEE  Result Date: 07/12/2020    TRANSESOPHOGEAL ECHO REPORT   Patient Name:   ISAIH BULGER Date of Exam: 07/12/2020 Medical Rec #:  [id number]      Height:       71.0 in Accession #:    [id number]     Weight:       160.0 lb Date of Birth:  [birth year]      BSA:          1.918  m Patient Age:    17 years       BP:           132/70 mmHg Patient Gender: M              HR:           71 bpm. Exam Location:  Inpatient Procedure: Transesophageal Echo, Cardiac Doppler and Color Doppler Indications:     CVA  History:         Patient has prior history of Echocardiogram examinations, most                  recent 07/01/2020. Cardiomyopathy, Stroke, Mitral Valve Disease;                  Risk Factors:Hypertension.  Sonographer:     Mikki Santee RDCS (AE) Referring Phys:  [id number] Abigail Butts Diagnosing Phys: Kirk Ruths MD PROCEDURE: The transesophogeal probe was passed without difficulty through the esophogus of the patient. Sedation performed by different physician. The patient was monitored while under deep sedation. Anesthestetic sedation  was provided intravenously by Anesthesiology: 182.81m of Propofol, 657mof Lidocaine. The patient developed no complications during the procedure. IMPRESSIONS  1. 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  2. Left ventricular ejection fraction, by estimation, is 55 to 60%. The left ventricle has normal function.  3. Right ventricular systolic function is normal. The right ventricular size is normal.  4. No left atrial/left atrial appendage thrombus was detected.  5. The mitral valve is abnormal. Mild mitral valve regurgitation. Moderate mitral annular calcification.  6. The aortic valve is tricuspid. Aortic valve regurgitation is mild to moderate. No aortic stenosis is present.  7. Aortic dilatation noted. There is mild dilatation of the aortic root, measuring 41 mm. There is Moderate (Grade III) plaque involving the descending aorta.  8. Evidence of atrial level shunting detected by color flow Doppler. Agitated saline contrast bubble study was positive with shunting observed after >6 cardiac cycles suggestive of intrapulmonary  shunting. FINDINGS  Left Ventricle: Left ventricular ejection fraction, by estimation, is 55 to 60%. The left ventricle has normal function. The left ventricular internal cavity size was normal in size. Right Ventricle: The right ventricular size is normal. Right vetricular wall thickness was not assessed. Right ventricular systolic function is normal. Left Atrium: Left atrial size was normal in size. No left atrial/left atrial appendage thrombus was detected. Right Atrium: Right atrial size was normal in size. Pericardium: There is no evidence of pericardial effusion. Mitral Valve: The mitral valve is abnormal. There is mild calcification of the mitral valve leaflet(s). Moderate mitral annular calcification. Mild mitral valve regurgitation. Tricuspid Valve: The tricuspid valve is normal in structure. Tricuspid valve regurgitation is mild. Aortic Valve: The aortic valve is tricuspid. Aortic valve regurgitation is mild to moderate. Aortic regurgitation PHT measures 965 msec. No aortic stenosis is present. Aortic valve mean gradient measures 5.0 mmHg. Aortic valve peak gradient measures 9.5 mmHg. Pulmonic Valve: The pulmonic valve was normal in structure. Pulmonic valve regurgitation is trivial. Aorta: Aortic dilatation noted. There is mild dilatation of the aortic root, measuring 41 mm. There is moderate (Grade III) plaque involving the descending aorta. IAS/Shunts: Evidence of atrial level shunting detected by color flow Doppler. Agitated saline contrast bubble study was positive with shunting observed after >6 cardiac cycles suggestive of intrapulmonary shunting. 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  AORTIC VALVE AV Vmax:      154.00 cm/s AV Vmean:     101.000 cm/s AV VTI:       0.303 m AV Peak Grad: 9.5 mmHg AV Mean Grad: 5.0 mmHg AI PHT:       965 msec  AORTA Ao Root  diam: 3.90 cm BrKirk RuthsD Electronically signed by BrKirk RuthsD Signature Date/Time: 07/12/2020/2:29:38 PM    Final    [facility]  Result Date: 07/01/2020 Carotid Arterial Duplex Study Indications:       CVA and Slurred speech & facial droop (left). Risk Factors:      Hypertension, current smoker. Other Factors:     ETOH abuse. Comparison Study:  No previous exams Performing Technologist: JoRogelia RohrerExamination Guidelines: A complete evaluation includes B-mode imaging, spectral Doppler, color Doppler, and power Doppler as needed of all accessible portions of each vessel. Bilateral  testing is considered an integral part of a complete examination. Limited examinations for reoccurring indications may be performed as noted.  Right Carotid Findings: +----------+--------+--------+--------+------------------+------------------+           PSV cm/sEDV cm/sStenosisPlaque DescriptionComments           +----------+--------+--------+--------+------------------+------------------+ CCA Prox  83      12                                intimal thickening +----------+--------+--------+--------+------------------+------------------+ CCA Distal86      16                                intimal thickening +----------+--------+--------+--------+------------------+------------------+ ICA Prox  53      10                                                   +----------+--------+--------+--------+------------------+------------------+ ICA Distal58      16                                                   +----------+--------+--------+--------+------------------+------------------+ ECA       125     8                                                    +----------+--------+--------+--------+------------------+------------------+ +----------+--------+-------+----------------+-------------------+           PSV cm/sEDV cmsDescribe        Arm Pressure (mmHG)  +----------+--------+-------+----------------+-------------------+ [id number]             Multiphasic, WNL                    +----------+--------+-------+----------------+-------------------+ +---------+--------+--+--------+-+---------+ VertebralPSV cm/s35EDV cm/s7Antegrade +---------+--------+--+--------+-+---------+  Left Carotid Findings: +----------+--------+--------+--------+------------------+------------------+           PSV cm/sEDV cm/sStenosisPlaque DescriptionComments           +----------+--------+--------+--------+------------------+------------------+ CCA Prox  84      15                                intimal thickening +----------+--------+--------+--------+------------------+------------------+ CCA Distal80      19                                intimal thickening +----------+--------+--------+--------+------------------+------------------+ ICA Prox  64      20                                                   +----------+--------+--------+--------+------------------+------------------+ ICA Distal76      26                                                   +----------+--------+--------+--------+------------------+------------------+  ECA       179     19                                                   +----------+--------+--------+--------+------------------+------------------+ +----------+--------+--------+----------------+-------------------+           PSV cm/sEDV cm/sDescribe        Arm Pressure (mmHG) +----------+--------+--------+----------------+-------------------+ [id number]              Multiphasic, WNL                    +----------+--------+--------+----------------+-------------------+ +---------+--------+--------+--------------+ VertebralPSV cm/sEDV cm/sNot identified +---------+--------+--------+--------------+   Summary: Right Carotid: The extracranial vessels were near-normal with only minimal wall                 thickening or plaque. Left Carotid: The extracranial vessels were near-normal with only minimal wall               thickening or plaque. Vertebrals:  Right vertebral artery demonstrates antegrade flow. Left vertebral              artery was not visualized. Subclavians: Normal flow hemodynamics were seen in bilateral subclavian              arteries. *See table(s) above for measurements and observations.  Electronically signed by Harold Barban MD on 07/01/2020 at 9:27:56 PM.    Final        IMPRESSION/PLAN: 1. 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 Dr. Lisbeth Renshaw has reviewed the patient's case and I reviewed with the patient to discussion we've had about his care. Dr. Lisbeth Renshaw would like to offer a course of radiotherapy. The patient is aware that he is still in the midst of his work up and needs a PET scan once he's outpatient to formally stage his disease. We discussed the risks, benefits, short, and long term effects of radiotherapy, as well as the curative intent based on his CT imaging. The patient is interested in proceeding. Dr. Lisbeth Renshaw discusses the delivery and logistics of radiotherapy and anticipates a course of 6 1/2 weeks of radiotherapy. We will order Stat PET once he's discharged which is expected tomorrow to [facility]. Once we know the date of his PET we will plan simulation and schedule subsequent treatment.  2. 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 The patient will follow up with neurology in the stroke clinic as an outpt. ASA will continue for now and maximizing anticoagulation will occur once hemoptysis from #1 is more stable.    In a visit lasting 60 minutes, greater than 50% of the time was spent by phone and in floor time discussing the patient's condition, in preparation for the discussion, and coordinating the patient's care with the patient and his daughter Shelbie Proctor.     Carola Rhine, [facility]   **Disclaimer: This note  was dictated with voice recognition software. Similar sounding words can inadvertently be transcribed and this note may contain transcription errors which may not have been corrected upon publication of note.**

## 2020-07-13 NOTE — Progress Notes (Signed)
PROGRESS NOTE  GRACEN SOUTHWELL RCV:[id number] DOB: 11-06-1950 DOA: 06/30/2020 PCP: Tamsen Roers, MD   LOS: 12 days   Brief Narrative / Interim history: 70 year old male with hypertrophic cardiomyopathy, alcohol and tobacco use, HTN, prostate cancer who came into the hospital with slurred speech and facial droop.  Apparently he continues to drink alcohol.  In the ED underwent brain MRI which showed acute infarction in the external capsule and caudate body on the right without mass-effect or hemorrhage.  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  Pulmonary was consulted and she underwent a bronchoscopy and lung mass biopsy on 4/29.  Primary pathology shows squamous cell carcinoma.  Oncology will follow as an outpatient  Subjective / 24h Interval events: Has been coughing up a little bit more but nothing comes up.  No hemoptysis.  Noticed that his urine is darker  Assessment & Plan: Principal Problem Acute right ischemic CVA external capsule and caudate body -based on the MRI on admission, neurology consulted and followed while hospitalized.  He was initially placed on aspirin and Plavix but this has been held due to hemoptysis.  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  Lipid panel showed an LDL of 115, he is on statin.  A1c is 6.3.  SNF has been recommended -Neurology recommended aspirin 325 and Plavix 75 for 3 months then Plavix alone given large vessel high-grade stenosis, but these have been on hold due to hemoptysis.  Discussed with neurology Dr Erlinda Hong on 5/2 and given hemoptysis, high risk for further bleed he recommends aspirin 325.  This was started on 5/2 -This morning unfortunately his urine is dark suggesting hematuria.  Send urinalysis.  Active Problems New right hilar mass, squamous cell carcinoma, complicated with  hemoptysis and acute blood loss anemia -hemoptysis has now resolved as he was off antiplatelet therapy.  Pulmonary consulted and he is status post bronchoscopy on 4/29 with pathology showing squamous cell carcinoma.  Oncology consulted, they will follow as an outpatient, radiation oncology also consulted and plans to start XRT and STAT PET scan as an outpatient soon after discharge  Hematuria-new onset this morning, after patient was started on aspirin 325 yesterday.  Discussed with Dr. Lovena Neighbours with urology, as long as patient does not develop blood loss anemia small degree of hematuria X acceptable until outpatient follow-up.  He is wondering about decreasing the aspirin dose to 81 mg.  Discussed again with Dr. Erlinda Hong and he is agreeable to aspirin 81.  Changed dose today.  Concern for endocarditis /mitral valve vegetations-this is based on the TTE, patient underwent a TEE on 5/2 which showed abnormal aortic valve with small mass associated with right coronary cusp, cannot rule out vegetation.  Blood cultures are negative, final still pending, patient is afebrile and he has no leukocytosis.  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  stroke: mapping our early stages of recovery book   Does not apply Once  . ALPRAZolam  0.5 mg Oral TID  . aspirin  325 mg Oral Daily  . atorvastatin  40 mg Oral Daily  . cyclobenzaprine  5 mg Oral TID  . folic acid  1 mg Oral Daily  . metoprolol succinate  25 mg Oral Daily  . multivitamin  1 tablet Oral Daily  . pantoprazole  40 mg Oral Daily  . polyethylene glycol  17 g Oral Daily  . thiamine  100 mg Oral Daily   Continuous Infusions: PRN Meds:.oxyCODONE **AND**  acetaminophen, acetaminophen **OR** [DISCONTINUED] acetaminophen (TYLENOL) oral liquid 160 mg/5 mL **OR** [DISCONTINUED] acetaminophen, albuterol, guaiFENesin-dextromethorphan, senna-docusate  Diet Orders (From admission, onward)    Start     Ordered   07/10/20 1301  Diet Heart Room service appropriate? No; Fluid consistency: Thin  Diet effective 1000       Question Answer Comment  Room service appropriate? No   Fluid consistency: Thin      07/10/20 1300          DVT prophylaxis:      Code Status: Full Code  Family Communication: no family at bedside, discussed with daughter over the phone  Status is: Inpatient  Remains inpatient appropriate because:Inpatient level of care appropriate due to severity of illness   Dispo: The patient is from: Home              Anticipated d/c is to: SNF              Patient currently is not medically stable to d/c.   Difficult to place patient No  Level of care: Telemetry Medical  Consultants:  Pulmonary Neurology   Procedures:  2D echo:  1. Left ventricular ejection fraction, by estimation, is 55 to 60%. The left ventricle has normal function. The left ventricle has no regional wall motion abnormalities. Left ventricular diastolic parameters are consistent with Grade I diastolic  dysfunction (impaired relaxation).  2. Right ventricular systolic function is normal. The right ventricular size is normal. There is normal pulmonary artery systolic pressure.  3. Severe calcified bulky lesions on posterior and anterior leaflets Despite calcification edges/borders seem irregular and favor vegetations rather than MAC Suggest TEE to further evaluate. The mitral valve is abnormal. Mild mitral valve regurgitation. No evidence of mitral stenosis.  4. Severe bulky calcification/ mass particularly on the Non coronary cusp and commisure between the right and non cusps. Again despite calcification edges irregular and suggest vegetations TEE and  cardiology consult suggested . The aortic valve is abnormal. Aortic valve regurgitation is mild. No aortic stenosis is  present.  5. The inferior vena cava is normal in size with greater than 50% respiratory variability, suggesting right atrial pressure of 3 mmHg.   Microbiology  none  Antimicrobials: None     Objective: Vitals:   07/12/20 2043 07/13/20 0033 07/13/20 0400 07/13/20 0801  BP: 114/68 106/65 140/67 (!) 147/85  Pulse: 71 71 73 91  Resp: _0 Temp: 98.2 F (36.8 C) 97.6 F (36.4 C) 97.7 F (36.5 C) 97.6 F (36.4 C)  TempSrc: Oral Oral Oral Oral  SpO2: 97% 95% 98%   Weight:      Height:        Intake/Output Summary (Last 24 hours) at 07/13/2020 1038 Last data filed at 07/13/2020 2025 Gross per 24 hour  Intake --  Output 700 ml  Net -700 ml   Filed Weights   07/05/20 0837  Weight: 72.6 kg    Examination:  Constitutional: No distress Eyes: No scleral icterus ENMT: mmm Neck: normal, supple Respiratory: Clear bilaterally, no wheezing, no crackles, normal respiratory effort Cardiovascular: Regular rate and rhythm, no  murmurs, no peripheral edema Abdomen: Soft, NT, ND, bowel sounds positive Skin: No rashes seen  Data Reviewed: I have independently reviewed following labs and imaging studies   CBC: Recent Labs  Lab 07/07/20 0224 07/08/20 0253 07/11/20 0156  WBC  --  10.3 8.2  HGB 12.6* 13.0 11.9*  HCT 38.7* 39.1 37.0*  MCV  --  93.8 95.4  PLT  --  322 970   Basic Metabolic Panel: Recent Labs  Lab 07/08/20 0253 07/11/20 0156  NA 133* 136  K 3.8 3.9  CL 103 100  CO2 22 29  GLUCOSE 122* 114*  BUN 14 12  CREATININE 0.57* 0.68  CALCIUM 8.8* 8.9   Liver Function Tests: Recent Labs  Lab 07/08/20 0253  AST 18  ALT 17  ALKPHOS 84  BILITOT 0.6  PROT 6.2*  ALBUMIN 3.2*   Coagulation Profile: No results for input(s): INR, PROTIME in the last 168 hours. HbA1C: No results for input(s): HGBA1C in the last 72 hours. CBG: No  results for input(s): GLUCAP in the last 168 hours.  Recent Results (from the past 240 hour(s))  Resp Panel by RT-PCR (Flu A&B, Covid) Nasopharyngeal Swab     Status: None   Collection Time: 07/12/20  5:34 PM   Specimen: Nasopharyngeal Swab; Nasopharyngeal(NP) swabs in vial transport medium  Result Value Ref Range Status   SARS Coronavirus 2 by RT PCR NEGATIVE NEGATIVE Final    Comment: (NOTE) SARS-CoV-2 target nucleic acids are NOT DETECTED.  The SARS-CoV-2 RNA is generally detectable in upper respiratory specimens during the acute phase of infection. The lowest concentration of SARS-CoV-2 viral copies this assay can detect is 138 copies/mL. A negative result does not preclude SARS-Cov-2 infection and should not be used as the sole basis for treatment or other patient management decisions. A negative result may occur with  improper specimen collection/handling, submission of specimen other than nasopharyngeal swab, presence of viral mutation(s) within the areas targeted by this assay, and inadequate number of viral copies(<138 copies/mL). A negative result must be combined with clinical observations, patient history, and epidemiological information. The expected result is Negative.  Fact Sheet for Patients:  EntrepreneurPulse.com.au  Fact Sheet for Healthcare Providers:  IncredibleEmployment.be  This test is no t yet approved or cleared by the [place] FDA and  has been authorized for detection and/or diagnosis of SARS-CoV-2 by FDA under an Emergency Use Authorization (EUA). This EUA will remain  in effect (meaning this test can be used) for the duration of the COVID-19 declaration under Section 564(b)(1) of the Act, 21 U.S.C.section 360bbb-3(b)(1), unless the authorization is terminated  or revoked sooner.       Influenza A by PCR NEGATIVE NEGATIVE Final   Influenza B by PCR NEGATIVE NEGATIVE Final    Comment: (NOTE) The Xpert  Xpress SARS-CoV-2/FLU/RSV plus assay is intended as an aid in the diagnosis of influenza from Nasopharyngeal swab specimens and should not be used as a sole basis for treatment. Nasal washings and aspirates are unacceptable for Xpert Xpress SARS-CoV-2/FLU/RSV testing.  Fact Sheet for Patients: EntrepreneurPulse.com.au  Fact Sheet for Healthcare Providers: IncredibleEmployment.be  This test is not yet approved or cleared by the [place] FDA and has been authorized for detection and/or diagnosis of SARS-CoV-2 by FDA under an Emergency Use Authorization (EUA). This EUA will remain in effect (meaning this test can be used) for the duration of the COVID-19 declaration under Section 564(b)(1) of the Act, 21 U.S.C. section 360bbb-3(b)(1), unless the authorization is terminated  or revoked.  Performed at [facility], [place] [address], [place], [place] [address]      Radiology Studies: No results found.  Marzetta Board, MD, PhD [facility]  Between 7 am - 7 pm I am available, please contact me via Amion (for emergencies) or Securechat (non urgent messages)  Between 7 pm - 7 am I am not available, please contact night coverage MD/APP via Amion

## 2020-07-13 NOTE — Progress Notes (Signed)
  Speech Language Pathology Treatment: Dysphagia;Cognitive-Linquistic  Patient Details Name: Cody Douglas MRN: [id number] DOB: 1950/08/13 Today's Date: 07/13/2020 Time: 2263-3354 SLP Time Calculation (min) (ACUTE ONLY): 23 min  Assessment / Plan / Recommendation Clinical Impression  SLP followed up for diet check and cognitive linguistic intervention. Pt upright in bed with regular thin liquid lunch meal. Pt able to self feed and exhibited good tolerance of current diet. No overt s/sx of difficulty noted with PO consumption. No further needs indicated for swallowing intervention.  SLP reinforced cognitive linguistic strategies for dysarthria of speech, including written information from prior SLP session (SLOP speech and WRAP for recall). Pt oriented x4 this date and exhibits overall improvements in cognitive function though states he still has difficulty with recall. Will continue to follow during acute stay to maximize cognitive linguistic recovery.    HPI HPI: 70 y/o male presenting to the ED with slurred speech and facial droop. 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 PMH includes hypertrophic cardiomyopathy, hyponatremia, hypokalemia, chest pain, tobacco use, ETOH abuse,hypertension, prostate cancer. Hospitalization complicated with hemoptysis due to a NEW diagnosed right pulmonary hilar mass.      SLP Plan  Continue with current plan of care       Recommendations  Diet recommendations: Regular;Thin liquid Liquids provided via: Cup;Straw Medication Administration: Whole meds with liquid Supervision: Patient able to self feed Compensations: Slow rate;Small sips/bites Postural Changes and/or Swallow Maneuvers: Seated upright 90 degrees;Upright 30-60 min after meal                Oral Care Recommendations: Oral care BID Follow up Recommendations: Skilled Nursing facility SLP Visit Diagnosis: Cognitive  communication deficit (R41.841);Dysphagia, unspecified (R13.10) Plan: Continue with current plan of care       [place], [place]   07/13/2020, 2:45 PM

## 2020-07-14 DIAGNOSIS — R319 Hematuria, unspecified: Secondary | ICD-10-CM

## 2020-07-14 LAB — BASIC METABOLIC PANEL
Anion gap: 6 (ref 5–15)
BUN: 12 mg/dL (ref 8–23)
CO2: 24 mmol/L (ref 22–32)
Calcium: 9.2 mg/dL (ref 8.9–10.3)
Chloride: 103 mmol/L (ref 98–111)
Creatinine, Ser: 0.67 mg/dL (ref 0.61–1.24)
GFR, Estimated: >60 mL/min (ref >60)
Glucose, Bld: 135 mg/dL — ABNORMAL HIGH (ref 70–99)
Potassium: 4 mmol/L (ref 3.5–5.1)
Sodium: 133 mmol/L — ABNORMAL LOW (ref 135–145)

## 2020-07-14 LAB — CBC
HCT: 40.8 % (ref 39.0–52.0)
Hemoglobin: 13.6 g/dL (ref 13.0–17.0)
MCH: 30.6 pg (ref 26.0–34.0)
MCHC: 33.3 g/dL (ref 30.0–36.0)
MCV: 91.9 fL (ref 80.0–100.0)
Platelets: 409 10*3/uL — ABNORMAL HIGH (ref 150–400)
RBC: 4.44 MIL/uL (ref 4.22–5.81)
RDW: 11.9 % (ref 11.5–15.5)
WBC: 22.6 10*3/uL — ABNORMAL HIGH (ref 4.0–10.5)
nRBC: 0 % (ref 0.0–0.2)

## 2020-07-14 NOTE — Progress Notes (Signed)
PROGRESS NOTE  Cody Douglas PIR:[id number] DOB: [birth year] DOA: 06/30/2020 PCP: Tamsen Roers, MD   LOS: 13 days   Brief Narrative / Interim history: 70 year old male with hypertrophic cardiomyopathy, alcohol and tobacco use, HTN, prostate cancer who came into the hospital with slurred speech and facial droop.  Apparently he continues to drink alcohol.  In the ED underwent brain MRI which showed acute infarction in the external capsule and caudate body on the right without mass-effect or hemorrhage.  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  Pulmonary was consulted and he underwent a bronchoscopy and lung mass biopsy on 4/29.  Primary pathology shows squamous cell carcinoma.  Oncology will follow as an outpatient  Subjective / 24h Interval events: Minimal cough but no hemoptysis for days.  Feels poorly.  Ongoing bloody urine, not significantly changed since yesterday per patient's RN who had him yesterday.  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  He was initially placed on aspirin and Plavix but this has been held due to hemoptysis.  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  Lipid panel showed an LDL of 115, he is on statin.  A1c is 6.3.  SNF has been recommended -Neurology recommended aspirin 325 and Plavix 75 for 3 months then Plavix alone given large vessel high-grade stenosis, but these have been on hold due to hemoptysis.  Discussed with neurology Dr Erlinda Hong on 5/2 and given hemoptysis, high risk for further bleed he initially recommended aspirin 325.  This was started on 5/2.  However after new onset hematuria that started on 5/2, Dr. Cruzita Lederer discussed again with Dr. Erlinda Hong, Neurology  who recommended reducing aspirin to 81 mg daily.  Active Problems New right hilar mass, squamous cell carcinoma, complicated with hemoptysis and acute blood loss anemia -hemoptysis has now resolved as he was off antiplatelet therapy.  Pulmonary consulted and he is status post bronchoscopy on 4/29 with pathology showing squamous cell carcinoma.  Oncology consulted, they will follow as an outpatient, radiation oncology also consulted and plans to start XRT and STAT PET scan as an outpatient soon after discharge.  PCCM MD discussed with patient regarding his diagnosis on 5/3 and also discussed with oncology the day prior.  They will arrange follow-up in pulmonary clinic.  Pulmonology signed off 5/3  Hematuria-new onset on 5/3 morning, after patient was started on aspirin 325 on 5/2.  Dr. Cruzita Lederer discussed with Dr. Lovena Neighbours with Urology: as long as patient does not develop blood loss anemia small degree of hematuria X acceptable until outpatient follow-up.  He is wondering about decreasing the aspirin dose to 81 mg.  Dr. Cruzita Lederer discussed again with Dr. Erlinda Hong and he is agreeable to aspirin 81.  Last dose of aspirin 325 mg daily was 5/3 and started 81 mg daily from 5/4.  Still has gross hematuria.  However remains hemodynamically stable and no significant anemia.  Follow CBC in a.m.  Concern for endocarditis /mitral valve vegetations-this is based on the TTE, patient underwent a TEE on 5/2 which showed abnormal aortic valve with small mass associated with right coronary cusp, cannot rule out vegetation.  Blood cultures are negative, final still pending, patient is afebrile. If there is a true vegetation there is likely marantic  endocarditis in the setting of advanced malignancy and without any systemic infection symptoms  Essential hypertension -continue metoprolol, controlled.  Tobacco and alcohol abuse -counseled for cessation  Leukocytosis-this had resolved but WBC up from 8.2-22.6.  Also interestingly  hemoglobin went up from 11.9-13.6.  Cannot explain these CBC findings.  No clear concern for infectious etiology.  Follow CBC in a.m.  Chronic pain-continue as needed oxycodone and Flexeril  Scheduled Meds: .  stroke: mapping our early stages of recovery book   Does not apply Once  . ALPRAZolam  0.5 mg Oral TID  . aspirin  81 mg Oral Daily  . atorvastatin  40 mg Oral Daily  . cyclobenzaprine  5 mg Oral TID  . folic acid  1 mg Oral Daily  . metoprolol succinate  25 mg Oral Daily  . multivitamin  1 tablet Oral Daily  . pantoprazole  40 mg Oral Daily  . polyethylene glycol  17 g Oral Daily  . thiamine  100 mg Oral Daily   Continuous Infusions: PRN Meds:.oxyCODONE **AND** acetaminophen, acetaminophen **OR** [DISCONTINUED] acetaminophen (TYLENOL) oral liquid 160 mg/5 mL **OR** [DISCONTINUED] acetaminophen, albuterol, guaiFENesin-dextromethorphan, senna-docusate  Diet Orders (From admission, onward)    Start     Ordered   07/10/20 1301  Diet Heart Room service appropriate? No; Fluid consistency: Thin  Diet effective 1000       Question Answer Comment  Room service appropriate? No   Fluid consistency: Thin      07/10/20 1300          DVT prophylaxis:      Code Status: Full Code  Family Communication: None at bedside.  Status is: Inpatient  Remains inpatient appropriate because:Inpatient level of care appropriate due to severity of illness   Dispo: The patient is from: Home              Anticipated d/c is to: SNF              Patient currently is not medically stable to d/c.   Difficult to place patient No  Level of care: Telemetry Medical  Consultants:  Pulmonary Neurology Radiation oncology  Procedures:  2D echo:  1. Left ventricular ejection fraction, by estimation, is 55 to 60%. The left ventricle has normal function. The left ventricle has no regional wall motion abnormalities. Left ventricular diastolic parameters are consistent with Grade I diastolic   dysfunction (impaired relaxation).  2. Right ventricular systolic function is normal. The right ventricular size is normal. There is normal pulmonary artery systolic pressure.  3. Severe calcified bulky lesions on posterior and anterior leaflets Despite calcification edges/borders seem irregular and favor vegetations rather than MAC Suggest TEE to further evaluate. The mitral valve is abnormal. Mild mitral valve regurgitation. No evidence of mitral stenosis.  4. Severe bulky calcification/ mass particularly on the Non coronary cusp and commisure between the right and non cusps. Again despite calcification edges irregular and suggest vegetations TEE and cardiology consult suggested . The aortic valve is abnormal. Aortic valve regurgitation is mild. No aortic stenosis is  present.  5. The inferior vena cava is normal in size with greater than 50% respiratory variability, suggesting right atrial pressure of 3 mmHg.   Cytopathology:  07/09/2020:  Specimen Submitted: C. LUNG, RIGHT MAINSTEM, LAVAGE:    FINAL MICROSCOPIC DIAGNOSIS:  - Malignant cells consistent with squamous cell carcinoma    07/09/2020 FINAL MICROSCOPIC DIAGNOSIS:   A. LUNG, RIGHT MAINSTEM, BRUSHING:  - Malignant cells consistent with squamous cell carcinoma.  B. LUNG, RIGHT MAINSTEM, FINE NEEDLE ASPIRATION:  - Malignant cells consistent with squamous cell carcinoma.   Microbiology  none  Antimicrobials: None     Objective: Vitals:   07/14/20 0345 07/14/20 0726 07/14/20 1058 07/14/20 1137  BP: 105/67 105/78 108/78 109/68  Pulse: 98 (!) 102 (!) 106 (!) 103  Resp: _0 Temp: 98.2 F (36.8 C) 98.6 F (37 C)  98.6 F (37 C)  TempSrc: Oral Oral  Oral  SpO2: 98% 98%  96%  Weight:      Height:       No intake or output data in the 24 hours ending 07/14/20 1334 Filed Weights   07/05/20 0837  Weight: 72.6 kg    Examination:  General exam: Elderly male, moderately built and frail, chronically  ill looking, lying comfortably propped up in bed without distress. Respiratory system: Clear to auscultation. Respiratory effort normal. Cardiovascular system: S1 & S2 heard, RRR. No JVD, murmurs, rubs, gallops or clicks. No pedal edema.  Telemetry personally reviewed: SR- ST in the 110s. Gastrointestinal system: Abdomen with mild suprapubic distention and tenderness (requested bladder scan), soft and nontender. No organomegaly or masses felt. Normal bowel sounds heard. GU: Has condom catheter.  Grossly bloody urine in bag without clots. Central nervous system: Alert and oriented. No focal neurological deficits. Extremities: Right limbs grade 5 x 5 power, left limbs grade 4 x 5 power. Skin: No rashes, lesions or ulcers Psychiatry: Judgement and insight appear normal. Mood & affect appropriate.     Data Reviewed: I have independently reviewed following labs and imaging studies   CBC: Recent Labs  Lab 07/08/20 0253 07/11/20 0156 07/14/20 0410  WBC 10.3 8.2 22.6*  HGB 13.0 11.9* 13.6  HCT 39.1 37.0* 40.8  MCV 93.8 95.4 91.9  PLT 322 354 353*   Basic Metabolic Panel: Recent Labs  Lab 07/08/20 0253 07/11/20 0156 07/14/20 0410  NA 133* 136 133*  K 3.8 3.9 4.0  CL 103 100 103  CO2 _1 GLUCOSE 122* 114* 135*  BUN _2 CREATININE 0.57* 0.68 0.67  CALCIUM 8.8* 8.9 9.2   Liver Function Tests: Recent Labs  Lab 07/08/20 0253  AST 18  ALT 17  ALKPHOS 84  BILITOT 0.6  PROT 6.2*  ALBUMIN 3.2*     Recent Results (from the past 240 hour(s))  Culture, blood (Routine X 2) w Reflex to ID Panel     Status: None (Preliminary result)   Collection Time: 07/12/20 12:09 PM   Specimen: BLOOD  Result Value Ref Range Status   Specimen Description BLOOD LEFT ANTECUBITAL  Final   Special Requests   Final    BOTTLES DRAWN AEROBIC AND ANAEROBIC Blood Culture adequate volume   Culture   Final    NO GROWTH 2 DAYS Performed at [facility], [address]  [place], [place] [address]    Report Status PENDING  Incomplete  Culture, blood (Routine X 2) w Reflex to ID Panel     Status: None (Preliminary result)   Collection Time: 07/12/20 12:11 PM   Specimen: BLOOD RIGHT HAND  Result Value Ref Range Status   Specimen Description BLOOD RIGHT HAND  Final   Special Requests   Final    BOTTLES DRAWN AEROBIC ONLY Blood Culture results may not be optimal due to an inadequate volume of blood received in culture bottles   Culture   Final    NO GROWTH 2 DAYS Performed  at [facility], [place] [address], [place], [place] [address]    Report Status PENDING  Incomplete  Resp Panel by RT-PCR (Flu A&B, Covid) Nasopharyngeal Swab     Status: None   Collection Time: 07/12/20  5:34 PM   Specimen: Nasopharyngeal Swab; Nasopharyngeal(NP) swabs in vial transport medium  Result Value Ref Range Status   SARS Coronavirus 2 by RT PCR NEGATIVE NEGATIVE Final    Comment: (NOTE) SARS-CoV-2 target nucleic acids are NOT DETECTED.  The SARS-CoV-2 RNA is generally detectable in upper respiratory specimens during the acute phase of infection. The lowest concentration of SARS-CoV-2 viral copies this assay can detect is 138 copies/mL. A negative result does not preclude SARS-Cov-2 infection and should not be used as the sole basis for treatment or other patient management decisions. A negative result may occur with  improper specimen collection/handling, submission of specimen other than nasopharyngeal swab, presence of viral mutation(s) within the areas targeted by this assay, and inadequate number of viral copies(<138 copies/mL). A negative result must be combined with clinical observations, patient history, and epidemiological information. The expected result is Negative.  Fact Sheet for Patients:  EntrepreneurPulse.com.au  Fact Sheet for Healthcare Providers:  IncredibleEmployment.be  This test is no t yet approved or cleared by  the [place] FDA and  has been authorized for detection and/or diagnosis of SARS-CoV-2 by FDA under an Emergency Use Authorization (EUA). This EUA will remain  in effect (meaning this test can be used) for the duration of the COVID-19 declaration under Section 564(b)(1) of the Act, 21 U.S.C.section 360bbb-3(b)(1), unless the authorization is terminated  or revoked sooner.       Influenza A by PCR NEGATIVE NEGATIVE Final   Influenza B by PCR NEGATIVE NEGATIVE Final    Comment: (NOTE) The Xpert Xpress SARS-CoV-2/FLU/RSV plus assay is intended as an aid in the diagnosis of influenza from Nasopharyngeal swab specimens and should not be used as a sole basis for treatment. Nasal washings and aspirates are unacceptable for Xpert Xpress SARS-CoV-2/FLU/RSV testing.  Fact Sheet for Patients: EntrepreneurPulse.com.au  Fact Sheet for Healthcare Providers: IncredibleEmployment.be  This test is not yet approved or cleared by the [place] FDA and has been authorized for detection and/or diagnosis of SARS-CoV-2 by FDA under an Emergency Use Authorization (EUA). This EUA will remain in effect (meaning this test can be used) for the duration of the COVID-19 declaration under Section 564(b)(1) of the Act, 21 U.S.C. section 360bbb-3(b)(1), unless the authorization is terminated or revoked.  Performed at [facility], [place] [address] [place], [place] [address]      Radiology Studies: No results found.  Vernell Leep, MD, [facility], [facility]. [facility]  To contact the attending provider between 7A-7P or the covering provider during after hours 7P-7A, please log into the web site [email or web address] and access using universal [place] password for that web site. If you do not have the password, please call the hospital operator.

## 2020-07-14 NOTE — Progress Notes (Signed)
Physical Therapy Treatment Patient Details Name: Cody Douglas MRN: [id number] DOB: [birth year] Today's Date: 07/14/2020    History of Present Illness Pt is a 70 y/o male presenting to the ED with slurred speech and facial droop. 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 Pt developed hemoptysis and CT revealed lung mass like lung CA. PMH includes hypertrophic cardiomyopathy, hyponatremia, hypokalemia, chest pain, tobacco use, ETOH abuse,hypertension, prostate cancer.    PT Comments    Patient reporting fatigue at beginning of session. Declined ambulation but agreeable to seated exercises. Patient requires minA for bed mobility and transfers. Patient performed therex seated EOB with good control. Continue to recommend SNF for ongoing Physical Therapy.       Follow Up Recommendations  SNF;Supervision/Assistance - 24 hour     Equipment Recommendations  Rolling Kedron Uno with 5" wheels;3in1 (PT)    Recommendations for Other Services       Precautions / Restrictions Precautions Precautions: Fall Precaution Comments: Reports hx of falls Restrictions Weight Bearing Restrictions: No    Mobility  Bed Mobility Overal bed mobility: Needs Assistance Bed Mobility: Supine to Sit;Sit to Supine     Supine to sit: Min assist Sit to supine: Min assist   General bed mobility comments: minA for trunk elevation to EOB and bringing LEs back onto bed    Transfers Overall transfer level: Needs assistance Equipment used: Rolling Granvel [place] (2 wheeled) Transfers: Sit to/from Stand Sit to Stand: Min assist         General transfer comment: Sit to stand x 5 with minA to power up each time. Cues for hand placement  Ambulation/Gait             General Gait Details: Deferred due to patient complaining of fatigue, agreeable to EOB exercises   Stairs             Wheelchair Mobility    Modified Rankin (Stroke  Patients Only) Modified Rankin (Stroke Patients Only) Pre-Morbid Rankin Score: No significant disability Modified Rankin: Moderately severe disability     Balance Overall balance assessment: Needs assistance Sitting-balance support: Bilateral upper extremity supported;Feet supported Sitting balance-Leahy Scale: Fair Sitting balance - Comments: able to sit EOB with supervision   Standing balance support: Bilateral upper extremity supported Standing balance-Leahy Scale: Poor Standing balance comment: relies on UE support and external assist                            Cognition Arousal/Alertness: Awake/alert Behavior During Therapy: Flat affect Overall Cognitive Status: Impaired/Different from baseline Area of Impairment: Attention;Awareness;Safety/judgement;Problem solving                   Current Attention Level: Sustained     Safety/Judgement: Decreased awareness of safety;Decreased awareness of deficits Awareness: Emergent Problem Solving: Slow processing;Requires verbal cues        Exercises General Exercises - Lower Extremity Long Arc Quad: AROM;Both;10 reps;Seated Hip Flexion/Marching: AROM;Both;10 reps;Seated Toe Raises: AROM;Both;10 reps;Seated Heel Raises: AROM;Both;10 reps;Seated    General Comments        Pertinent Vitals/Pain Pain Assessment: Faces Faces Pain Scale: Hurts little more Pain Location: back Pain Descriptors / Indicators: Aching;Grimacing;Discomfort Pain Intervention(s): Monitored during session;Repositioned    Home Living                      Prior Function  PT Goals (current goals can now be found in the care plan section) Acute Rehab PT Goals Patient Stated Goal: Go home PT Goal Formulation: With patient Time For Goal Achievement: 07/15/20 Potential to Achieve Goals: Fair Progress towards PT goals: Progressing toward goals    Frequency    Min 3X/week      PT Plan Current plan  remains appropriate    Co-evaluation              AM-PAC PT "6 Clicks" Mobility   Outcome Measure  Help needed turning from your back to your side while in a flat bed without using bedrails?: A Little Help needed moving from lying on your back to sitting on the side of a flat bed without using bedrails?: A Little Help needed moving to and from a bed to a chair (including a wheelchair)?: A Little Help needed standing up from a chair using your arms (e.g., wheelchair or bedside chair)?: A Little Help needed to walk in hospital room?: A Little Help needed climbing 3-5 steps with a railing? : A Lot 6 Click Score: 17    End of Session Equipment Utilized During Treatment: Gait belt Activity Tolerance: Patient tolerated treatment well Patient left: in bed;with call bell/phone within reach;with bed alarm set Nurse Communication: Mobility status PT Visit Diagnosis: Unsteadiness on feet (R26.81);Muscle weakness (generalized) (M62.81);History of falling (Z91.81)     Time: 6333-5456 PT Time Calculation (min) (ACUTE ONLY): 17 min  Charges:  $Therapeutic Exercise: 8-22 mins                     Keshayla Schrum A. Gilford Rile PT, DPT Acute Rehabilitation Services Pager [phone] Office [phone]    Linna Hoff 07/14/2020, 5:24 PM

## 2020-07-15 ENCOUNTER — Other Ambulatory Visit: Payer: Self-pay | Admitting: Radiation Oncology

## 2020-07-15 DIAGNOSIS — R319 Hematuria, unspecified: Secondary | ICD-10-CM | POA: Diagnosis not present

## 2020-07-15 DIAGNOSIS — C3490 Malignant neoplasm of unspecified part of unspecified bronchus or lung: Secondary | ICD-10-CM

## 2020-07-15 LAB — CBC
HCT: 39.2 % (ref 39.0–52.0)
Hemoglobin: 12.8 g/dL — ABNORMAL LOW (ref 13.0–17.0)
MCH: 30.8 pg (ref 26.0–34.0)
MCHC: 32.7 g/dL (ref 30.0–36.0)
MCV: 94.2 fL (ref 80.0–100.0)
Platelets: 379 10*3/uL (ref 150–400)
RBC: 4.16 MIL/uL — ABNORMAL LOW (ref 4.22–5.81)
RDW: 12.1 % (ref 11.5–15.5)
WBC: 19.5 10*3/uL — ABNORMAL HIGH (ref 4.0–10.5)
nRBC: 0 % (ref 0.0–0.2)

## 2020-07-15 LAB — RESP PANEL BY RT-PCR (FLU A&B, COVID) ARPGX2
Influenza A by PCR: NEGATIVE
Influenza B by PCR: NEGATIVE
SARS Coronavirus 2 by RT PCR: NEGATIVE

## 2020-07-15 MED ORDER — ALPRAZOLAM 0.5 MG PO TABS: 0.5000 mg | ORAL_TABLET | Freq: Three times a day (TID) | ORAL | 0 refills | Status: DC

## 2020-07-15 MED ORDER — OXYCODONE-ACETAMINOPHEN 10-325 MG PO TABS: 1.0000 | ORAL_TABLET | Freq: Three times a day (TID) | ORAL | 0 refills | Status: DC | PRN

## 2020-07-15 MED ORDER — CYCLOBENZAPRINE HCL 10 MG PO TABS: 5.0000 mg | ORAL_TABLET | Freq: Three times a day (TID) | ORAL | Status: DC

## 2020-07-15 MED ORDER — ATORVASTATIN CALCIUM 40 MG PO TABS
40.0000 mg | ORAL_TABLET | Freq: Every day | ORAL | Status: DC
Start: 2020-07-16 — End: 2020-08-30

## 2020-07-15 MED ORDER — THIAMINE HCL 100 MG PO TABS: 100.0000 mg | ORAL_TABLET | Freq: Every day | ORAL | Status: DC

## 2020-07-15 MED ORDER — FOLIC ACID 1 MG PO TABS: 1.0000 mg | ORAL_TABLET | Freq: Every day | ORAL | Status: DC

## 2020-07-15 NOTE — Discharge Instructions (Signed)

## 2020-07-15 NOTE — TOC Transition Note (Signed)
Transition of Care [facility]) - CM/SW Discharge Note   Patient Details  Name: YONATHAN PERROW MRN: [id number] Date of Birth: 07/12/1950  [facility]) CM/SW Contact:  Geralynn Ochs, LCSW Phone Number: 07/15/2020, 1:15 PM   Clinical Narrative:   Nurse to call report to [phone], Room 217    Final next level of care: Skilled Nursing Facility Barriers to Discharge: Barriers Resolved   Patient Goals and CMS Choice Patient states their goals for this hospitalization and ongoing recovery are:: to get back home CMS Medicare.gov Compare Post Acute Care list provided to:: Patient Choice offered to / list presented to : Patient  Discharge Placement              Patient chooses bed at: [facility] Patient to be transferred to facility by: [place] Name of family member notified: Hinton Dyer Patient and family notified of of transfer: 07/15/20  Discharge Plan and Services     Post Acute Care Choice: [facility]                               Social Determinants of Health (SDOH) Interventions     Readmission Risk Interventions No flowsheet data found.

## 2020-07-15 NOTE — Plan of Care (Signed)
  Problem: Ischemic Stroke/TIA Tissue Perfusion: Goal: Complications of ischemic stroke/TIA will be minimized Outcome: Progressing   

## 2020-07-15 NOTE — Progress Notes (Signed)
Report called to SNF with no one picking up  The phone at the receiving end. Will call back.

## 2020-07-15 NOTE — Progress Notes (Signed)
Cody Douglas  D/C'd Skilled nursing facility per MD order.  Discussed with the patient and all questions fully answered.  VSS, Skin clean, dry and intact without evidence of skin break down, no evidence of skin tears noted. IV catheter discontinued intact. Site without signs and symptoms of complications. Dressing and pressure applied.  An After Visit Summary was printed and given to PTAR.  Prescription. Included in the packet given to PTAR.  D/c education completed with PTAR including follow up instructions, medication list, d/c activities limitations if indicated, with other d/c instructions as indicated by MD - patient able to verbalize understanding, all questions fully answered.   Patient instructed to return to ED, call 911, or call MD for any changes in condition.   Patient escorted via stretcher, and D/C to SNF via anbulance.  [name] 07/15/2020 3:04 PM

## 2020-07-15 NOTE — Discharge Summary (Signed)
Physician Discharge Summary  LYNCOLN Douglas KDX:[id number] DOB: 1950/08/19  PCP: Tamsen Roers, MD  Admitted from: Home Discharged to: SNF  Admit date: 06/30/2020 Discharge date: 07/15/2020  Recommendations for Outpatient Follow-up:    Follow-up Information    [facility]. Schedule an appointment as soon as possible for a visit in 4 week(s).   Specialty: Neurology Contact information: [address] [place] [phone]       MD at SNF. Schedule an appointment as soon as possible for a visit.   Why: To be seen in 2 to 3 days with repeat labs (CBC & BMP).  Kindly ensure patient follows up with outpatient medical and radiation oncology, urology and pulmonology.       Kyung Rudd, MD. Schedule an appointment as soon as possible for a visit.   Specialty: Radiation Oncology Contact information: [address] [place] [address] [phone]        Curt Bears, MD. Schedule an appointment as soon as possible for a visit.   Specialty: Oncology Contact information: [place] [address] [phone]        Ceasar Mons, MD. Schedule an appointment as soon as possible for a visit in 2 week(s).   Specialty: Urology Contact information: [address] [place] [address] [phone]        Tamsen Roers, MD. Schedule an appointment as soon as possible for a visit.   Specialty: Family Medicine Why: Upon discharge from SNF. Contact information: [address] [place] [address] [phone]        June Leap L, DO. Schedule an appointment as soon as possible for a visit.   Specialty: Pulmonary Disease Contact information: [address] [place] [address] [phone]                Home Health: None    Equipment/Devices: TBD at SNF.    Discharge Condition: Improved and stable.   Code Status: Full Code Diet  recommendation:  Discharge Diet Orders (From admission, onward)    Start     Ordered   07/15/20 0000  Diet - low sodium heart healthy        07/15/20 1049           Discharge Diagnoses:  Principal Problem:   Acute CVA (cerebrovascular accident) ([place]) Active Problems:   Tobacco abuse   Essential hypertension   Alcohol use   Leukocytosis   CVA (cerebral vascular accident) ([place])   Malignant neoplasm of upper lobe of right lung ([place])   Endobronchial mass   Brief Summary: 70 year old male with hypertrophic cardiomyopathy, alcohol and tobacco use, HTN, prostate cancer who came into the hospital with slurred speech and facial droop.  Apparently he continues to drink alcohol.  In the ED underwent brain MRI which showed acute infarction in the external capsule and caudate body on the right without mass-effect or hemorrhage.  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  Pulmonary was consulted and he underwent a bronchoscopy and lung mass biopsy on 4/29.  Primary pathology shows squamous cell carcinoma.  Oncology will follow as an outpatient  Assessment & Plan: Principal Problem Acute right ischemic CVA external capsule and caudate body -based on the MRI on admission, neurology consulted and followed  while hospitalized.  He was initially placed on aspirin and Plavix but this has been held due to hemoptysis.  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  Lipid panel showed an LDL of 115, he is on statin.  A1c is 6.3.  SNF has been recommended -Neurology recommended aspirin 325 and Plavix 75 for 3 months then Plavix alone given large vessel high-grade stenosis, but these had been on hold due to hemoptysis.  Discussed with Neurology Dr Erlinda Hong on 5/2 and given hemoptysis, high risk for further bleed he initially recommended aspirin  325 mg daily.  This was started on 5/2.  However after new onset hematuria that started on 5/2, Dr. Cruzita Lederer discussed again with Dr. Erlinda Hong, Neurology who recommended reducing aspirin to 81 mg daily.  Hematuria has significantly decreased.  I discussed with Dr. Erlinda Hong today, okay to continue current dose of aspirin and outpatient follow-up with Neurology.  Active Problems New right hilar mass, squamous cell carcinoma, complicated with hemoptysis and acute blood loss anemia -hemoptysis has now resolved for several days.  Pulmonary consulted and he is status post bronchoscopy on 4/29 with pathology showing squamous cell carcinoma.  Oncology consulted, they will follow as an outpatient, radiation oncology also consulted and plans to start XRT and STAT PET scan as an outpatient soon after discharge.  PCCM MD discussed with patient regarding his diagnosis on 5/3 and also discussed with oncology the day prior.  They will arrange follow-up in pulmonary clinic.  Pulmonology signed off 5/3. I have sent both a secure chat and an in basket message to both the medical and radiation oncology teams and have received confirmation from radiation oncology that they will arrange outpatient follow-up and evaluation.  Hematuria-new onset on 5/3 morning, after patient was started on aspirin 325 on 5/2.  Dr. Cruzita Lederer discussed with Dr. Lovena Neighbours with Urology: as long as patient does not develop blood loss anemia small degree of hematuria X acceptable until outpatient follow-up.  He is wondering about decreasing the aspirin dose to 81 mg.  Dr. Cruzita Lederer discussed again with Dr. Erlinda Hong and he is agreeable to aspirin 81.  Last dose of aspirin 325 mg daily was 5/3 and started 81 mg daily from 5/4. Hematuria has significantly improved today.  Remains hemodynamically stable and no significant anemia.  Concern for endocarditis /mitral valve vegetations-this is based on the TTE, patient underwent a TEE on 5/2 which showed abnormal aortic valve with  small mass associated with right coronary cusp, cannot rule out vegetation.  Blood cultures are negative, final results still pending, patient is afebrile. 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  May consider outpatient cardiology consultation.  Essential hypertension -continue metoprolol, controlled.  Discontinued Zestoretic.  If blood pressure starts to increase then may consider resuming.  Tobacco and alcohol abuse -counseled for cessation.  Continue thiamine and folate and multivitamins.  Leukocytosis-this had resolved but WBC up from 8.2-22.6.  Also interestingly hemoglobin went up from 11.9-13.6.  Cannot explain these CBC findings.  No clear concern for infectious etiology.  WBC count better.  Hemoglobin stable.  No clear signs and symptoms suggestive of infectious etiology.  Follow CBC closely at SNF.  Chronic pain/anxiety-continue as needed oxycodone and Flexeril.  I personally reviewed [place] PDMP, patient is prescribed chronic opioids and benzodiazepines.  Have provided with a short course at time of discharge from the hospital to SNF which will have to be renewed  at the SNF and then followed by PCP as outpatient.   Consultations: Pulmonary Neurology Radiation oncology Cardiology  Procedures:  As noted above.  Cytopathology:  07/09/2020:  Specimen Submitted: C. LUNG, RIGHT MAINSTEM, LAVAGE:    FINAL MICROSCOPIC DIAGNOSIS:  - Malignant cells consistent with squamous cell carcinoma    07/09/2020 FINAL MICROSCOPIC DIAGNOSIS:   A. LUNG, RIGHT MAINSTEM, BRUSHING:  - Malignant cells consistent with squamous cell carcinoma.   B. LUNG, RIGHT MAINSTEM, FINE NEEDLE ASPIRATION:  - Malignant cells consistent with squamous cell carcinoma.    Discharge Instructions  Discharge Instructions    Ambulatory referral to Neurology   Complete by: As directed    Follow  up with stroke clinic NP (Jessica Vanschaick or Cecille Rubin, if both not available, consider Zachery Dauer, or [place]) at [facility] in about 4 weeks. Thanks.   Call MD for:   Complete by: As directed    Recurrent strokelike symptoms, coughing up blood/hemoptysis or worsening blood in urine/hematuria.   Call MD for:  difficulty breathing, headache or visual disturbances   Complete by: As directed    Call MD for:  extreme fatigue   Complete by: As directed    Call MD for:  persistant dizziness or light-headedness   Complete by: As directed    Call MD for:  persistant nausea and vomiting   Complete by: As directed    Call MD for:  severe uncontrolled pain   Complete by: As directed    Call MD for:  temperature >100.4   Complete by: As directed    Diet - low sodium heart healthy   Complete by: As directed    Increase activity slowly   Complete by: As directed        Medication List    STOP taking these medications   BC Headache 325-95-16 MG Tabs Generic drug: Aspirin-Salicylamide-Caffeine   lisinopril-hydrochlorothiazide 10-12.5 MG tablet Commonly known as: ZESTORETIC   metoprolol tartrate 12.5 mg Tabs tablet Commonly known as: LOPRESSOR     TAKE these medications   albuterol 108 (90 Base) MCG/ACT inhaler Commonly known as: VENTOLIN HFA Inhale 2 puffs into the lungs every 6 (six) hours as needed for wheezing or shortness of breath.   ALPRAZolam 0.5 MG tablet Commonly known as: XANAX Take 1 tablet (0.5 mg total) by mouth 3 (three) times daily. LF on 06-25-20 # 90 DS 30   aspirin 81 MG chewable tablet Chew 1 tablet (81 mg total) by mouth daily.   atorvastatin 40 MG tablet Commonly known as: LIPITOR Take 1 tablet (40 mg total) by mouth daily. Start taking on: Jul 16, 2020   cyclobenzaprine 10 MG tablet Commonly known as: FLEXERIL Take 0.5 tablets (5 mg total) by mouth 3 (three) times daily. What changed: how much to take   folic acid 1 MG tablet Commonly known as:  FOLVITE Take 1 tablet (1 mg total) by mouth daily. Start taking on: Jul 16, 2020   metoprolol succinate 25 MG 24 hr tablet Commonly known as: TOPROL-XL Take 25 mg by mouth daily.   multivitamin with minerals tablet Take 1 tablet by mouth daily.   omeprazole 20 MG capsule Commonly known as: PRILOSEC Take 20 mg by mouth daily.   oxyCODONE-acetaminophen 10-325 MG tablet Commonly known as: PERCOCET Take 1 tablet by mouth every 8 (eight) hours as needed for pain. What changed: when to take this   polyethylene glycol 17 g packet Commonly known as: MIRALAX / GLYCOLAX Take 17 g by mouth daily.  thiamine 100 MG tablet Take 1 tablet (100 mg total) by mouth daily. Start taking on: Jul 16, 2020      No Known Allergies    Procedures/Studies: DG Chest 1 View  Result Date: 07/04/2020 CLINICAL DATA:  Shortness of breath. EXAM: CHEST  1 VIEW COMPARISON:  September 12, 2019 FINDINGS: Platelike opacity extending from the right hilum peripherally, similar since September 12, 2019 but new compared to 2015. Mild fullness in the right hilar region. Left lung is clear. No pneumothorax. No other acute abnormalities. IMPRESSION: Platelike opacity extending from the right hilum peripherally with some fullness in the right hilar region. Recommend CT imaging for better evaluation. These results will be called to the ordering clinician or representative by the Radiologist Assistant, and communication documented in the PACS or [employer]. Electronically Signed   By: Dorise Bullion III M.D   On: 07/04/2020 15:03   CT CHEST WO CONTRAST  Result Date: 07/05/2020 CLINICAL DATA:  Hemoptysis. EXAM: CT CHEST WITHOUT CONTRAST TECHNIQUE: Multidetector CT imaging of the chest was performed following the standard protocol without IV contrast. COMPARISON:  Plain film of the chest 07/05/2023. CT chest 12/13/2013. FINDINGS: Cardiovascular: No significant vascular findings. Normal heart size. No pericardial effusion. Calcific  aortic and coronary atherosclerosis noted. Mediastinum/Nodes: A right hilar mass abutting the superior aspect of the right mainstem bronchus and trachea measures 3.9 cm AP x 4.3 cm transverse on image 67 of series 3 x 5.2 cm craniocaudal on coronal image 45, series 5. Amorphous calcifications are present within the lesion. The thyroid gland and esophagus appear normal. Lungs/Pleura: Reticulonodular opacities are seen in the right upper lobe. A nodule along the major fissure in the right upper lobe measures 1.4 cm transverse by 0.6 cm AP on image 39 of series 4. An irregularly-shaped right upper lobe nodule measures 2.3 x 2.5 cm on image 42 of series 4. There is also some discoid atelectasis in the anterior right upper lobe which is likely postobstructive. Mild dependent atelectasis is seen in the left lung base. There is also some peribronchial thickening in the lower lobes bilaterally. Upper Abdomen: Negative. Musculoskeletal: No lytic or sclerotic lesion. Convex right scoliosis and multilevel degenerative disease noted. IMPRESSION: Right hilar mass consistent with carcinoma. 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 Reticulonodular opacities in the right upper lobe, a subpleural nodule along the major fissure and nodule in the upper lobe are most consistent with tumor spread. Aortic Atherosclerosis (ICD10-I70.0). Calcific coronary artery disease also noted. Electronically Signed   By: Inge Rise M.D.   On: 07/05/2020 12:01   MR ANGIO HEAD WO CONTRAST  Result Date: 07/01/2020 CLINICAL DATA:  Stroke EXAM: MRA HEAD WITHOUT CONTRAST TECHNIQUE: Angiographic images of the Circle of Willis were obtained using MRA technique without intravenous contrast. COMPARISON:  MRI head 06/30/2020 FINDINGS: Internal carotid artery widely patent and normal bilaterally. Severe stenosis right M1 segment. There appears to be associated filling defect. This could be atherosclerotic  disease and or thrombus. This extends to the right MCA bifurcation. Right M2 branches patent with decreased caliber compared to the left. Fenestrated left M1 segment. Left MCA widely patent. Both anterior cerebral arteries widely patent. Basilar widely patent. PICA patent bilaterally. Both vertebral arteries widely patent. Posterior cerebral arteries widely patent with fetal origin on the right. IMPRESSION: Severe stenosis right M1 segment. Associated filling defect may represent thrombus and or atherosclerotic plaque. Decreased perfusion right MCA branches which remains patent. No other significant cranial stenosis  Code stroke imaging results were communicated on 07/01/2020 at 2:11 pm to provider Erlinda Hong via text page Electronically Signed   By: Franchot Gallo M.D.   On: 07/01/2020 14:13   MR BRAIN WO CONTRAST  Result Date: 06/30/2020 CLINICAL DATA:  Transient ischemic attack. EXAM: MRI HEAD WITHOUT CONTRAST TECHNIQUE: Multiplanar, multiecho pulse sequences of the brain and surrounding structures were obtained without intravenous contrast. COMPARISON:  None. FINDINGS: Brain: Diffusion imaging shows acute infarction within the external capsule and caudate body on the right. No evidence of mass effect or hemorrhage. No other acute insult. Elsewhere, the brainstem is normal. Old small vessel cerebellar infarctions inferior on the left. 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 No sign of mass lesion, hemorrhage, hydrocephalus or extra-axial collection. Vascular: Major vessels at the base of the brain show flow. Skull and upper cervical spine: Negative Sinuses/Orbits: Clear/normal Other: None IMPRESSION: 1. Acute infarction affecting the external capsule and caudate body on the right. No mass effect or hemorrhage. 2. 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 Electronically Signed   By: Nelson Chimes M.D.   On: 06/30/2020 20:30   CT ABDOMEN PELVIS W CONTRAST  Result Date: 07/06/2020 CLINICAL  DATA:  Lung cancer.  Novel diagnosis.  Staging examination. EXAM: CT ABDOMEN AND PELVIS WITH CONTRAST TECHNIQUE: Multidetector CT imaging of the abdomen and pelvis was performed using the standard protocol following bolus administration of intravenous contrast. CONTRAST:  143m OMNIPAQUE IOHEXOL 300 MG/ML  SOLN COMPARISON:  06/23/2004 FINDINGS: Lower chest: Minimal left basilar atelectasis. Mild coronary artery calcification. Hepatobiliary: No focal liver abnormality is seen. No gallstones, gallbladder wall thickening, or biliary dilatation. Pancreas: Unremarkable Spleen: Unremarkable Adrenals/Urinary Tract: The adrenal glands are unremarkable. The kidneys are normal in size and position. Scattered areas of renal cortical atrophy have developed bilaterally,, more prevalent within the lower poles bilaterally. No enhancing intrarenal masses. No intrarenal or ureteral calcifications. No hydronephrosis. The bladder is unremarkable. Stomach/Bowel: Moderate sigmoid diverticulosis. The stomach, small bowel, and large bowel are otherwise unremarkable. Appendix normal. No free intraperitoneal gas or fluid. Vascular/Lymphatic: Extensive aortoiliac atherosclerotic calcification. 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 No pathologic adenopathy within the abdomen and pelvis. Reproductive: Prostate is unremarkable. Other: Tiny fat containing umbilical hernia.  Rectum unremarkable. Musculoskeletal: No lytic or blastic bone lesions are identified. Degenerative changes are noted throughout the visualized thoracolumbar spine. No acute bone abnormality. IMPRESSION: No evidence of metastatic disease within the abdomen and pelvis. Interval development of 3.2 cm infrarenal abdominal aortic aneurysm Recommend follow-up ultrasound every 3 years. This recommendation follows ACR consensus guidelines: White Paper of the  ACR Incidental Findings Committee II on Vascular Findings. J Am Coll Radiol 2013; 10:789-794. Moderate sigmoid diverticulosis. Aortic aneurysm NOS (ICD10-I71.9). Aortic Atherosclerosis (ICD10-I70.0). Electronically Signed   By: AFidela SalisburyMD   On: 07/06/2020 00:43   ECHOCARDIOGRAM COMPLETE  Result Date: 07/01/2020    ECHOCARDIOGRAM REPORT   Patient Name:   Cody DEVOLDate of Exam: 07/01/2020 Medical Rec #:  [id number]     Height:       71.0 in Accession #:    [id number]    Weight:       170.0 lb Date of Birth:  108/11/1950     BSA:          1.968 m Patient Age:    89years       BP:  125/79 mmHg Patient Gender: M              HR:           77 bpm. Exam Location:  Inpatient Procedure: 2D Echo, Cardiac Doppler and Color Doppler Indications:    CVA  History:        Patient has no prior history of Echocardiogram examinations.                 Risk Factors:Hypertension.  Sonographer:    Luisa Hart RDCS Referring Phys: [id number] [place] Comments: No cardiac surgery or procedures noted in chart IMPRESSIONS  1. Left ventricular ejection fraction, by estimation, is 55 to 60%. The left ventricle has normal function. The left ventricle has no regional wall motion abnormalities. Left ventricular diastolic parameters are consistent with Grade I diastolic dysfunction (impaired relaxation).  2. Right ventricular systolic function is normal. The right ventricular size is normal. There is normal pulmonary artery systolic pressure.  3. Severe calcified bulky lesions on posterior and anterior leaflets Despite calcification edges/borders seem irregular and favor vegetations rather than MAC Suggest TEE to further evaluate. The mitral valve is abnormal. Mild mitral valve regurgitation.  No evidence of mitral stenosis.  4. Severe bulky calcification/ mass particularly on the Non coronary cusp and commisure between the right and non cusps. Again despite calcification edges irregular and suggest  vegetations TEE and cardiology consult suggested . The aortic valve is abnormal. Aortic valve regurgitation is mild. No aortic stenosis is present.  5. The inferior vena cava is normal in size with greater than 50% respiratory variability, suggesting right atrial pressure of 3 mmHg. FINDINGS  Left Ventricle: Left ventricular ejection fraction, by estimation, is 55 to 60%. The left ventricle has normal function. The left ventricle has no regional wall motion abnormalities. The left ventricular internal cavity size was normal in size. There is  no left ventricular hypertrophy. Left ventricular diastolic parameters are consistent with Grade I diastolic dysfunction (impaired relaxation). Right Ventricle: The right ventricular size is normal. No increase in right ventricular wall thickness. Right ventricular systolic function is normal. There is normal pulmonary artery systolic pressure. The tricuspid regurgitant velocity is 2.56 m/s, and  with an assumed right atrial pressure of 3 mmHg, the estimated right ventricular systolic pressure is 01.6 mmHg. Left Atrium: Left atrial size was normal in size. Right Atrium: Right atrial size was normal in size. Pericardium: There is no evidence of pericardial effusion. Mitral Valve: Severe calcified bulky lesions on posterior and anterior leaflets Despite calcification edges/borders seem irregular and favor vegetations rather than MAC Suggest TEE to further evaluate. The mitral valve is abnormal. Mild mitral valve regurgitation. No evidence of mitral valve stenosis. MV peak gradient, 8.1 mmHg. The mean mitral valve gradient is 4.0 mmHg. Tricuspid Valve: The tricuspid valve is normal in structure. Tricuspid valve regurgitation is not demonstrated. No evidence of tricuspid stenosis. Aortic Valve: Severe bulky calcification/ mass particularly on the Non coronary cusp and commisure between the right and non cusps. Again despite calcification edges irregular and suggest vegetations TEE  and cardiology consult suggested. The aortic valve  is abnormal. Aortic valve regurgitation is mild. Aortic regurgitation PHT measures 385 msec. No aortic stenosis is present. Aortic valve mean gradient measures 9.5 mmHg. Aortic valve peak gradient measures 15.9 mmHg. Aortic valve area, by VTI measures 5.87 cm. Pulmonic Valve: The pulmonic valve was normal in structure. Pulmonic valve regurgitation is not visualized. No evidence of pulmonic stenosis. Aorta: The  aortic root is normal in size and structure. Venous: The inferior vena cava is normal in size with greater than 50% respiratory variability, suggesting right atrial pressure of 3 mmHg. IAS/Shunts: No atrial level shunt detected by color flow Doppler.  LEFT VENTRICLE PLAX 2D LVIDd:         5.20 cm     Diastology LVIDs:         4.00 cm     LV e' medial:    4.35 cm/s LV PW:         0.70 cm     LV E/e' medial:  21.0 LV IVS:        1.00 cm     LV e' lateral:   7.62 cm/s LVOT diam:     2.70 cm     LV E/e' lateral: 12.0 LV SV:         226 LV SV Index:   115 LVOT Area:     5.73 cm  LV Volumes (MOD) LV vol d, MOD A2C: 79.2 ml LV vol d, MOD A4C: 56.4 ml LV vol s, MOD A2C: 29.7 ml LV vol s, MOD A4C: 21.6 ml LV SV MOD A2C:     49.5 ml LV SV MOD A4C:     56.4 ml LV SV MOD BP:      48.8 ml RIGHT VENTRICLE RV S prime:     12.10 cm/s TAPSE (M-mode): 1.5 cm LEFT ATRIUM             Index       RIGHT ATRIUM           Index LA diam:        3.60 cm 1.83 cm/m  RA Area:     13.80 cm LA Vol (A2C):   31.8 ml 16.16 ml/m RA Volume:   32.60 ml  16.57 ml/m LA Vol (A4C):   46.7 ml 23.73 ml/m LA Biplane Vol: 39.5 ml 20.07 ml/m  AORTIC VALVE                    PULMONIC VALVE AV Area (Vmax):    5.85 cm     PV Vmax:       0.66 m/s AV Area (Vmean):   5.39 cm     PV Peak grad:  1.8 mmHg AV Area (VTI):     5.87 cm AV Vmax:           199.50 cm/s AV Vmean:          143.500 cm/s AV VTI:            0.384 m AV Peak Grad:      15.9 mmHg AV Mean Grad:      9.5 mmHg LVOT Vmax:         204.00  cm/s LVOT Vmean:        135.000 cm/s LVOT VTI:          0.394 m LVOT/AV VTI ratio: 1.03 AI PHT:            385 msec  AORTA Ao Root diam: 4.00 cm MITRAL VALVE                TRICUSPID VALVE MV Area (PHT): 2.09 cm     TR Peak grad:   26.2 mmHg MV Area VTI:   5.32 cm     TR Vmax:        256.00 cm/s MV Peak grad:  8.1 mmHg MV Mean grad:  4.0 mmHg     SHUNTS MV Vmax:       1.42 m/s     Systemic VTI:  0.39 m MV Vmean:      90.8 cm/s    Systemic Diam: 2.70 cm MV Decel Time: 363 msec MR Peak grad: 94.5 mmHg MR Vmax:      486.00 cm/s MV E velocity: 91.30 cm/s MV A velocity: 145.00 cm/s MV E/A ratio:  0.63 Jenkins Rouge MD Electronically signed by Jenkins Rouge MD Signature Date/Time: 07/01/2020/10:07:46 AM    Final    ECHO TEE  Result Date: 07/12/2020    TRANSESOPHOGEAL ECHO REPORT   Patient Name:   Cody Douglas Date of Exam: 07/12/2020 Medical Rec #:  [id number]      Height:       71.0 in Accession #:    [id number]     Weight:       160.0 lb Date of Birth:  January 05, 1951      BSA:          1.918 m Patient Age:    33 years       BP:           132/70 mmHg Patient Gender: M              HR:           71 bpm. Exam Location:  Inpatient Procedure: Transesophageal Echo, Cardiac Doppler and Color Doppler Indications:     CVA  History:         Patient has prior history of Echocardiogram examinations, most                  recent 07/01/2020. Cardiomyopathy, Stroke, Mitral Valve Disease;                  Risk Factors:Hypertension.  Sonographer:     Mikki Santee RDCS (AE) Referring Phys:  [id number] Abigail Butts Diagnosing Phys: Kirk Ruths MD PROCEDURE: The transesophogeal probe was passed without difficulty through the esophogus of the patient. Sedation performed by different physician. The patient was monitored while under deep sedation. Anesthestetic sedation was provided intravenously by Anesthesiology: 182.59m of Propofol, 676mof Lidocaine. The patient developed no complications during the procedure. IMPRESSIONS  1.  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  2. Left ventricular ejection fraction, by estimation, is 55 to 60%. The left ventricle has normal function.  3. Right ventricular systolic function is normal. The right ventricular size is normal.  4. No left atrial/left atrial appendage thrombus was detected.  5. The mitral valve is abnormal. Mild mitral valve regurgitation. Moderate mitral annular calcification.  6. The aortic valve is tricuspid. Aortic valve regurgitation is mild to moderate. No aortic stenosis is present.  7. Aortic dilatation noted. There is mild dilatation of the aortic root, measuring 41 mm. There is Moderate (Grade III) plaque involving the descending aorta.  8. Evidence of atrial level shunting detected by color flow Doppler. Agitated saline contrast bubble study was positive with shunting observed after >6 cardiac cycles suggestive of intrapulmonary shunting. FINDINGS  Left Ventricle: Left ventricular ejection fraction, by estimation, is 55 to 60%. The left ventricle has normal function. The left ventricular internal cavity size was normal in size. Right Ventricle: The right ventricular size is normal. Right vetricular wall thickness was not assessed. Right ventricular systolic function is normal. Left Atrium: Left  atrial size was normal in size. No left atrial/left atrial appendage thrombus was detected. Right Atrium: Right atrial size was normal in size. Pericardium: There is no evidence of pericardial effusion. Mitral Valve: The mitral valve is abnormal. There is mild calcification of the mitral valve leaflet(s). Moderate mitral annular calcification. Mild mitral valve regurgitation. Tricuspid Valve: The tricuspid valve is normal in structure. Tricuspid valve regurgitation is mild. Aortic Valve: The aortic valve is tricuspid. Aortic valve regurgitation is mild to moderate.  Aortic regurgitation PHT measures 965 msec. No aortic stenosis is present. Aortic valve mean gradient measures 5.0 mmHg. Aortic valve peak gradient measures 9.5 mmHg. Pulmonic Valve: The pulmonic valve was normal in structure. Pulmonic valve regurgitation is trivial. Aorta: Aortic dilatation noted. There is mild dilatation of the aortic root, measuring 41 mm. There is moderate (Grade III) plaque involving the descending aorta. IAS/Shunts: Evidence of atrial level shunting detected by color flow Doppler. Agitated saline contrast bubble study was positive with shunting observed after >6 cardiac cycles suggestive of intrapulmonary shunting. 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  AORTIC VALVE AV Vmax:      154.00 cm/s AV Vmean:     101.000 cm/s AV VTI:       0.303 m AV Peak Grad: 9.5 mmHg AV Mean Grad: 5.0 mmHg AI PHT:       965 msec  AORTA Ao Root diam: 3.90 cm Kirk Ruths MD Electronically signed by Kirk Ruths MD Signature Date/Time: 07/12/2020/2:29:38 PM    Final    VAS US CAROTID (at [facility] and [facility] only)  Result Date: 07/01/2020 Carotid Arterial Duplex Study Indications:       CVA and Slurred speech & facial droop (left). Risk Factors:      Hypertension, current smoker. Other Factors:     ETOH abuse. Comparison Study:  No previous exams Performing Technologist: Rogelia Rohrer  Examination Guidelines: A complete evaluation includes B-mode imaging, spectral Doppler, color Doppler, and power Doppler as needed of all accessible portions of each vessel. Bilateral testing is considered an integral part of a complete examination. Limited examinations for reoccurring indications may be performed as noted.  Right Carotid Findings: +----------+--------+--------+--------+------------------+------------------+           PSV cm/sEDV cm/sStenosisPlaque DescriptionComments            +----------+--------+--------+--------+------------------+------------------+ CCA Prox  83      12                                intimal thickening +----------+--------+--------+--------+------------------+------------------+ CCA Distal86      16                                intimal thickening +----------+--------+--------+--------+------------------+------------------+ ICA Prox  53      10                                                   +----------+--------+--------+--------+------------------+------------------+ ICA Distal58      16                                                   +----------+--------+--------+--------+------------------+------------------+  ECA       125     8                                                    +----------+--------+--------+--------+------------------+------------------+ +----------+--------+-------+----------------+-------------------+           PSV cm/sEDV cmsDescribe        Arm Pressure (mmHG) +----------+--------+-------+----------------+-------------------+ [id number]             Multiphasic, WNL                    +----------+--------+-------+----------------+-------------------+ +---------+--------+--+--------+-+---------+ VertebralPSV cm/s35EDV cm/s7Antegrade +---------+--------+--+--------+-+---------+  Left Carotid Findings: +----------+--------+--------+--------+------------------+------------------+           PSV cm/sEDV cm/sStenosisPlaque DescriptionComments           +----------+--------+--------+--------+------------------+------------------+ CCA Prox  84      15                                intimal thickening +----------+--------+--------+--------+------------------+------------------+ CCA Distal80      19                                intimal thickening +----------+--------+--------+--------+------------------+------------------+ ICA Prox  64      20                                                    +----------+--------+--------+--------+------------------+------------------+ ICA Distal76      26                                                   +----------+--------+--------+--------+------------------+------------------+ ECA       179     19                                                   +----------+--------+--------+--------+------------------+------------------+ +----------+--------+--------+----------------+-------------------+           PSV cm/sEDV cm/sDescribe        Arm Pressure (mmHG) +----------+--------+--------+----------------+-------------------+ [id number]              Multiphasic, WNL                    +----------+--------+--------+----------------+-------------------+ +---------+--------+--------+--------------+ VertebralPSV cm/sEDV cm/sNot identified +---------+--------+--------+--------------+   Summary: Right Carotid: The extracranial vessels were near-normal with only minimal wall                thickening or plaque. Left Carotid: The extracranial vessels were near-normal with only minimal wall               thickening or plaque. Vertebrals:  Right vertebral artery demonstrates antegrade flow. Left vertebral              artery was not visualized. Subclavians: Normal flow hemodynamics were seen in bilateral subclavian  arteries. *See table(s) above for measurements and observations.  Electronically signed by Harold Barban MD on 07/01/2020 at 9:27:56 PM.    Final        Subjective: Chronic low back pain.  Denies any other complaints.  No further coughing or hemoptysis for several days.  No chest pain or dyspnea.  Bloody urine has improved.  No abdominal pain.  No dysuria.  No fever or chills.  Discharge Exam:  Vitals:   07/14/20 1957 07/15/20 0009 07/15/20 0430 07/15/20 0752  BP: 110/66 (!) 137/55 119/80 101/65  Pulse: 92 85 91 86  Resp: _0 Temp: 98.3 F (36.8 C) 98 F (36.7 C) 97.6 F (36.4  C) 97.7 F (36.5 C)  TempSrc: Oral Oral Oral Oral  SpO2: 97% 97% 97% 96%  Weight:      Height:        General exam: Elderly male, moderately built and frail, chronically ill looking, lying comfortably propped up in bed without distress. Respiratory system: Clear to auscultation. Respiratory effort normal. Cardiovascular system: S1 & S2 heard, RRR. No JVD, murmurs, rubs, gallops or clicks. No pedal edema.  Telemetry personally reviewed: Sinus rhythm with BBB morphology. Gastrointestinal system: Abdomen with mild suprapubic distention and tenderness (requested bladder scan), soft and nontender. No organomegaly or masses felt. Normal bowel sounds heard. GU: Has condom catheter.    Significantly improved hematuria.  Now minimal in bag.  Actually urine draining in tube only has faint blood-tinged.  No clots noted. Central nervous system: Alert and oriented. No focal neurological deficits. Extremities: Right limbs grade 5 x 5 power, left limbs grade 4 x 5 power. Skin: No rashes, lesions or ulcers Psychiatry: Judgement and insight appear normal. Mood & affect appropriate.     The results of significant diagnostics from this hospitalization (including imaging, microbiology, ancillary and laboratory) are listed below for reference.     Microbiology: Recent Results (from the past 240 hour(s))  Culture, blood (Routine X 2) w Reflex to ID Panel     Status: None (Preliminary result)   Collection Time: 07/12/20 12:09 PM   Specimen: BLOOD  Result Value Ref Range Status   Specimen Description BLOOD LEFT ANTECUBITAL  Final   Special Requests   Final    BOTTLES DRAWN AEROBIC AND ANAEROBIC Blood Culture adequate volume   Culture   Final    NO GROWTH 3 DAYS Performed at [facility], [address], [place], [place] [address]    Report Status PENDING  Incomplete  Culture, blood (Routine X 2) w Reflex to ID Panel     Status: None (Preliminary result)   Collection Time: 07/12/20 12:11 PM    Specimen: BLOOD RIGHT HAND  Result Value Ref Range Status   Specimen Description BLOOD RIGHT HAND  Final   Special Requests   Final    BOTTLES DRAWN AEROBIC ONLY Blood Culture results may not be optimal due to an inadequate volume of blood received in culture bottles   Culture   Final    NO GROWTH 3 DAYS Performed at [facility], [place] [address] [place], [place] [address]    Report Status PENDING  Incomplete  Resp Panel by RT-PCR (Flu A&B, Covid) Nasopharyngeal Swab     Status: None   Collection Time: 07/12/20  5:34 PM   Specimen: Nasopharyngeal Swab; Nasopharyngeal(NP) swabs in vial transport medium  Result Value Ref Range Status   SARS Coronavirus 2 by RT PCR NEGATIVE NEGATIVE Final  Comment: (NOTE) SARS-CoV-2 target nucleic acids are NOT DETECTED.  The SARS-CoV-2 RNA is generally detectable in upper respiratory specimens during the acute phase of infection. The lowest concentration of SARS-CoV-2 viral copies this assay can detect is 138 copies/mL. A negative result does not preclude SARS-Cov-2 infection and should not be used as the sole basis for treatment or other patient management decisions. A negative result may occur with  improper specimen collection/handling, submission of specimen other than nasopharyngeal swab, presence of viral mutation(s) within the areas targeted by this assay, and inadequate number of viral copies(<138 copies/mL). A negative result must be combined with clinical observations, patient history, and epidemiological information. The expected result is Negative.  Fact Sheet for Patients:  EntrepreneurPulse.com.au  Fact Sheet for Healthcare Providers:  IncredibleEmployment.be  This test is no t yet approved or cleared by the [place] FDA and  has been authorized for detection and/or diagnosis of SARS-CoV-2 by FDA under an Emergency Use Authorization (EUA). This EUA will remain  in effect (meaning this  test can be used) for the duration of the COVID-19 declaration under Section 564(b)(1) of the Act, 21 U.S.C.section 360bbb-3(b)(1), unless the authorization is terminated  or revoked sooner.       Influenza A by PCR NEGATIVE NEGATIVE Final   Influenza B by PCR NEGATIVE NEGATIVE Final    Comment: (NOTE) The Xpert Xpress SARS-CoV-2/FLU/RSV plus assay is intended as an aid in the diagnosis of influenza from Nasopharyngeal swab specimens and should not be used as a sole basis for treatment. Nasal washings and aspirates are unacceptable for Xpert Xpress SARS-CoV-2/FLU/RSV testing.  Fact Sheet for Patients: EntrepreneurPulse.com.au  Fact Sheet for Healthcare Providers: IncredibleEmployment.be  This test is not yet approved or cleared by the [place] FDA and has been authorized for detection and/or diagnosis of SARS-CoV-2 by FDA under an Emergency Use Authorization (EUA). This EUA will remain in effect (meaning this test can be used) for the duration of the COVID-19 declaration under Section 564(b)(1) of the Act, 21 U.S.C. section 360bbb-3(b)(1), unless the authorization is terminated or revoked.  Performed at [facility], [place] [address] [place], [place] [address]      Labs: CBC: Recent Labs  Lab 07/11/20 0156 07/14/20 0410 07/15/20 0500  WBC 8.2 22.6* 19.5*  HGB 11.9* 13.6 12.8*  HCT 37.0* 40.8 39.2  MCV 95.4 91.9 94.2  PLT 354 409* 812    Basic Metabolic Panel: Recent Labs  Lab 07/11/20 0156 07/14/20 0410  NA 136 133*  K 3.9 4.0  CL 100 103  CO2 29 24  GLUCOSE 114* 135*  BUN 12 12  CREATININE 0.68 0.67  CALCIUM 8.9 9.2     Urinalysis    Component Value Date/Time   COLORURINE RED (A) 07/13/2020 1901   APPEARANCEUR TURBID (A) 07/13/2020 1901   LABSPEC  07/13/2020 1901    TEST NOT REPORTED DUE TO COLOR INTERFERENCE OF URINE PIGMENT   PHURINE  07/13/2020 1901    TEST NOT REPORTED DUE TO COLOR INTERFERENCE  OF URINE PIGMENT   GLUCOSEU (A) 07/13/2020 1901    TEST NOT REPORTED DUE TO COLOR INTERFERENCE OF URINE PIGMENT   HGBUR (A) 07/13/2020 1901    TEST NOT REPORTED DUE TO COLOR INTERFERENCE OF URINE PIGMENT   BILIRUBINUR (A) 07/13/2020 1901    TEST NOT REPORTED DUE TO COLOR INTERFERENCE OF URINE PIGMENT   KETONESUR (A) 07/13/2020 1901    TEST NOT REPORTED DUE TO COLOR INTERFERENCE OF URINE PIGMENT  PROTEINUR (A) 07/13/2020 1901    TEST NOT REPORTED DUE TO COLOR INTERFERENCE OF URINE PIGMENT   UROBILINOGEN 2.0 (H) 12/13/2013 0738   NITRITE (A) 07/13/2020 1901    TEST NOT REPORTED DUE TO COLOR INTERFERENCE OF URINE PIGMENT   LEUKOCYTESUR (A) 07/13/2020 1901    TEST NOT REPORTED DUE TO COLOR INTERFERENCE OF URINE PIGMENT    I discussed in detail with patient's daughter via phone, updated care and answered all questions.  She was appreciative of the call.  Time coordinating discharge: 40 minutes  SIGNED:  Vernell Leep, MD, [place], [facility]. [facility]  To contact the attending provider between 7A-7P or the covering provider during after hours 7P-7A, please log into the web site [email or web address] and access using universal [place] password for that web site. If you do not have the password, please call the hospital operator.

## 2020-07-16 ENCOUNTER — Telehealth: Payer: Self-pay | Admitting: *Deleted

## 2020-07-16 ENCOUNTER — Other Ambulatory Visit: Payer: Self-pay | Admitting: Internal Medicine

## 2020-07-16 DIAGNOSIS — C3411 Malignant neoplasm of upper lobe, right bronchus or lung: Secondary | ICD-10-CM

## 2020-07-16 NOTE — Telephone Encounter (Signed)
CALLED PATIENT TO INFORM OF PET SCAN FOR 07-27-20 - ARRIVAL TIME- 9:30 AM @ [facility], PT. TO BE NPO- 6 HRS. PRIOR TO TEST, NO ANSWER, UNABLE TO LEAVE VM, DUE TO VM NOT BEING SET UP

## 2020-07-16 NOTE — Telephone Encounter (Signed)
I called [facility] and update on appt for Mr. Weisner to see Dr. Julien Nordmann.

## 2020-07-17 LAB — URINE CULTURE: Culture: >=100000 — AB

## 2020-07-17 LAB — CULTURE, BLOOD (ROUTINE X 2)
Culture: NO GROWTH
Culture: NO GROWTH
Special Requests: ADEQUATE

## 2020-07-20 ENCOUNTER — Telehealth: Payer: Self-pay | Admitting: *Deleted

## 2020-07-20 NOTE — Telephone Encounter (Signed)
CALLED PATIENT'S DAUGHTER- DANA TO INFORM OF PET SCAN FOR 07-27-20 - ARRIVAL TIME- 9:30 AM @ [facility], PT. TO BE NPO- AFTER MIDNIGHT THE DAY BEFORE SCAN, SPOKE WITH PATIENT'S DAUGHTER DANA AND SHE IS AWARE OF THIS TEST

## 2020-07-21 ENCOUNTER — Encounter: Payer: Self-pay | Admitting: Internal Medicine

## 2020-07-21 ENCOUNTER — Encounter: Payer: Self-pay | Admitting: *Deleted

## 2020-07-21 ENCOUNTER — Other Ambulatory Visit: Payer: Self-pay | Admitting: Medical Oncology

## 2020-07-21 ENCOUNTER — Inpatient Hospital Stay (HOSPITAL_BASED_OUTPATIENT_CLINIC_OR_DEPARTMENT_OTHER): Payer: Medicare Other | Admitting: Internal Medicine

## 2020-07-21 ENCOUNTER — Other Ambulatory Visit: Payer: Self-pay

## 2020-07-21 ENCOUNTER — Inpatient Hospital Stay: Payer: Medicare Other | Attending: Internal Medicine

## 2020-07-21 VITALS — BP 127/78 | HR 110 | Temp 100.7°F | Resp 19 | Ht 71.0 in | Wt 145.7 lb

## 2020-07-21 DIAGNOSIS — Z801 Family history of malignant neoplasm of trachea, bronchus and lung: Secondary | ICD-10-CM | POA: Diagnosis not present

## 2020-07-21 DIAGNOSIS — I10 Essential (primary) hypertension: Secondary | ICD-10-CM

## 2020-07-21 DIAGNOSIS — Z8673 Personal history of transient ischemic attack (TIA), and cerebral infarction without residual deficits: Secondary | ICD-10-CM | POA: Diagnosis not present

## 2020-07-21 DIAGNOSIS — Z8546 Personal history of malignant neoplasm of prostate: Secondary | ICD-10-CM | POA: Diagnosis not present

## 2020-07-21 DIAGNOSIS — Z5111 Encounter for antineoplastic chemotherapy: Secondary | ICD-10-CM

## 2020-07-21 DIAGNOSIS — Z8249 Family history of ischemic heart disease and other diseases of the circulatory system: Secondary | ICD-10-CM

## 2020-07-21 DIAGNOSIS — I2510 Atherosclerotic heart disease of native coronary artery without angina pectoris: Secondary | ICD-10-CM | POA: Diagnosis not present

## 2020-07-21 DIAGNOSIS — Z79899 Other long term (current) drug therapy: Secondary | ICD-10-CM | POA: Diagnosis not present

## 2020-07-21 DIAGNOSIS — C3411 Malignant neoplasm of upper lobe, right bronchus or lung: Secondary | ICD-10-CM | POA: Insufficient documentation

## 2020-07-21 DIAGNOSIS — Z7982 Long term (current) use of aspirin: Secondary | ICD-10-CM

## 2020-07-21 DIAGNOSIS — F17210 Nicotine dependence, cigarettes, uncomplicated: Secondary | ICD-10-CM

## 2020-07-21 DIAGNOSIS — Z833 Family history of diabetes mellitus: Secondary | ICD-10-CM

## 2020-07-21 LAB — CMP (CANCER CENTER ONLY)
ALT: 37 U/L (ref 0–44)
AST: 35 U/L (ref 15–41)
Albumin: 3.3 g/dL — ABNORMAL LOW (ref 3.5–5.0)
Alkaline Phosphatase: 166 U/L — ABNORMAL HIGH (ref 38–126)
Anion gap: 12 (ref 5–15)
BUN: 15 mg/dL (ref 8–23)
CO2: 27 mmol/L (ref 22–32)
Calcium: 9.5 mg/dL (ref 8.9–10.3)
Chloride: 102 mmol/L (ref 98–111)
Creatinine: 0.73 mg/dL (ref 0.61–1.24)
GFR, Estimated: >60 mL/min (ref >60)
Glucose, Bld: 135 mg/dL — ABNORMAL HIGH (ref 70–99)
Potassium: 4.2 mmol/L (ref 3.5–5.1)
Sodium: 141 mmol/L (ref 135–145)
Total Bilirubin: 0.4 mg/dL (ref 0.3–1.2)
Total Protein: 7.2 g/dL (ref 6.5–8.1)

## 2020-07-21 LAB — CBC WITH DIFFERENTIAL (CANCER CENTER ONLY)
Abs Immature Granulocytes: 0.05 10*3/uL (ref 0.00–0.07)
Basophils Absolute: 0 10*3/uL (ref 0.0–0.1)
Basophils Relative: 0 %
Eosinophils Absolute: 0 10*3/uL (ref 0.0–0.5)
Eosinophils Relative: 0 %
HCT: 41.9 % (ref 39.0–52.0)
Hemoglobin: 13.6 g/dL (ref 13.0–17.0)
Immature Granulocytes: 0 %
Lymphocytes Relative: 8 %
Lymphs Abs: 0.9 10*3/uL (ref 0.7–4.0)
MCH: 30.4 pg (ref 26.0–34.0)
MCHC: 32.5 g/dL (ref 30.0–36.0)
MCV: 93.7 fL (ref 80.0–100.0)
Monocytes Absolute: 0.6 10*3/uL (ref 0.1–1.0)
Monocytes Relative: 6 %
Neutro Abs: 9.9 10*3/uL — ABNORMAL HIGH (ref 1.7–7.7)
Neutrophils Relative %: 86 %
Platelet Count: 331 10*3/uL (ref 150–400)
RBC: 4.47 MIL/uL (ref 4.22–5.81)
RDW: 12.5 % (ref 11.5–15.5)
WBC Count: 11.6 10*3/uL — ABNORMAL HIGH (ref 4.0–10.5)
nRBC: 0 % (ref 0.0–0.2)

## 2020-07-21 MED ORDER — PROCHLORPERAZINE MALEATE 10 MG PO TABS: 10.0000 mg | ORAL_TABLET | Freq: Four times a day (QID) | ORAL | 0 refills | Status: DC | PRN

## 2020-07-21 NOTE — Progress Notes (Signed)
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

## 2020-07-21 NOTE — Patient Instructions (Signed)
Steps to Quit Smoking Smoking tobacco is the leading cause of preventable death. It can affect almost every organ in the body. Smoking puts you and people around you at risk for many serious, long-lasting (chronic) diseases. Quitting smoking can be hard, but it is one of the best things that you can do for your health. It is never too late to quit. How do I get ready to quit? When you decide to quit smoking, make a plan to help you succeed. Before you quit:  Pick a date to quit. Set a date within the next 2 weeks to give you time to prepare.  Write down the reasons why you are quitting. Keep this list in places where you will see it often.  Tell your family, friends, and co-workers that you are quitting. Their support is important.  Talk with your doctor about the choices that may help you quit.  Find out if your health insurance will pay for these treatments.  Know the people, places, things, and activities that make you want to smoke (triggers). Avoid them. What first steps can I take to quit smoking?  Throw away all cigarettes at home, at work, and in your car.  Throw away the things that you use when you smoke, such as ashtrays and lighters.  Clean your car. Make sure to empty the ashtray.  Clean your home, including curtains and carpets. What can I do to help me quit smoking? Talk with your doctor about taking medicines and seeing a counselor at the same time. You are more likely to succeed when you do both.  If you are pregnant or breastfeeding, talk with your doctor about counseling or other ways to quit smoking. Do not take medicine to help you quit smoking unless your doctor tells you to do so. To quit smoking: Quit right away  Quit smoking totally, instead of slowly cutting back on how much you smoke over a period of time.  Go to counseling. You are more likely to quit if you go to counseling sessions regularly. Take medicine You may take medicines to help you quit. Some  medicines need a prescription, and some you can buy over-the-counter. Some medicines may contain a drug called nicotine to replace the nicotine in cigarettes. Medicines may:  Help you to stop having the desire to smoke (cravings).  Help to stop the problems that come when you stop smoking (withdrawal symptoms). Your doctor may ask you to use:  Nicotine patches, gum, or lozenges.  Nicotine inhalers or sprays.  Non-nicotine medicine that is taken by mouth. Find resources Find resources and other ways to help you quit smoking and remain smoke-free after you quit. These resources are most helpful when you use them often. They include:  Online chats with a counselor.  Phone quitlines.  Printed self-help materials.  Support groups or group counseling.  Text messaging programs.  Mobile phone apps. Use apps on your mobile phone or tablet that can help you stick to your quit plan. There are many free apps for mobile phones and tablets as well as websites. Examples include Quit Guide from the CDC and smokefree.gov   What things can I do to make it easier to quit?  Talk to your family and friends. Ask them to support and encourage you.  Call a phone quitline (1-800-QUIT-NOW), reach out to support groups, or work with a counselor.  Ask people who smoke to not smoke around you.  Avoid places that make you want to smoke,   such as: ? Bars. ? Parties. ? Smoke-break areas at work.  Spend time with people who do not smoke.  Lower the stress in your life. Stress can make you want to smoke. Try these things to help your stress: ? Getting regular exercise. ? Doing deep-breathing exercises. ? Doing yoga. ? Meditating. ? Doing a body scan. To do this, close your eyes, focus on one area of your body at a time from head to toe. Notice which parts of your body are tense. Try to relax the muscles in those areas.   How will I feel when I quit smoking? Day 1 to 3 weeks Within the first 24 hours,  you may start to have some problems that come from quitting tobacco. These problems are very bad 2-3 days after you quit, but they do not often last for more than 2-3 weeks. You may get these symptoms:  Mood swings.  Feeling restless, nervous, angry, or annoyed.  Trouble concentrating.  Dizziness.  Strong desire for high-sugar foods and nicotine.  Weight gain.  Trouble pooping (constipation).  Feeling like you may vomit (nausea).  Coughing or a sore throat.  Changes in how the medicines that you take for other issues work in your body.  Depression.  Trouble sleeping (insomnia). Week 3 and afterward After the first 2-3 weeks of quitting, you may start to notice more positive results, such as:  Better sense of smell and taste.  Less coughing and sore throat.  Slower heart rate.  Lower blood pressure.  Clearer skin.  Better breathing.  Fewer sick days. Quitting smoking can be hard. Do not give up if you fail the first time. Some people need to try a few times before they succeed. Do your best to stick to your quit plan, and talk with your doctor if you have any questions or concerns. Summary  Smoking tobacco is the leading cause of preventable death. Quitting smoking can be hard, but it is one of the best things that you can do for your health.  When you decide to quit smoking, make a plan to help you succeed.  Quit smoking right away, not slowly over a period of time.  When you start quitting, seek help from your doctor, family, or friends. This information is not intended to replace advice given to you by your health care provider. Make sure you discuss any questions you have with your health care provider. Document Revised: 11/22/2018 Document Reviewed: 05/18/2018 Elsevier Patient Education  2021 Elsevier Inc.  

## 2020-07-21 NOTE — Progress Notes (Signed)
Oncology Nurse Navigator Documentation  Oncology Nurse Navigator Flowsheets 07/21/2020  Abnormal Finding Date 07/04/2020  Confirmed Diagnosis Date 07/09/2020  Diagnosis Status Confirmed Diagnosis Complete  Planned Course of Treatment Chemo/Radiation Concurrent  Phase of Treatment Radiation  Navigator Follow Up Date: 07/23/2020  Navigator Follow Up Reason: Appointment Review  Navigator Location [facility]  Referral Date to RadOnc/MedOnc 07/12/2020  Navigator Encounter Type Clinic/MDC  Patient Visit Type Initial;MedOnc/Met Mr. Necaise today at his first visit with Dr. Julien Nordmann.  He has been dx with stage IIIA lung cancer and his treatment plan is concurrent chemo rad to start week of 5/23.  I help to explain his plan of care.   Treatment Phase Pre-Tx/Tx Discussion  Barriers/Navigation Needs Coordination of Care;Education  Education Newly Diagnosed Cancer Education  Interventions Education;Psycho-Social Support  Acuity Level 2-Minimal Needs (1-2 Barriers Identified)  Education Method Verbal;Written  Time Spent with Patient 36

## 2020-07-21 NOTE — Progress Notes (Signed)
[place] Telephone:([phone]) [phone]   Fax:([phone]) [phone]  CONSULT NOTE  REFERRING PHYSICIAN: Dr. Leory Plowman Icard  REASON FOR CONSULTATION:  70 years old white male recently diagnosed with lung cancer.  HPI Cody Douglas is a 70 y.o. male with past medical history significant for multiple medical problems including history of hypertrophic cardiomyopathy, and anxiety, GERD, hypertension, history of prostate cancer in addition to alcohol and tobacco abuse.  The patient presented to the emergency department in April of 2022 complaining of slurred speech and facial droop.  MRI of the brain on June 30, 2020 showed acute infarction affecting the external capsule and caudate body on the right with no mass-effect or hemorrhage.  He was also complaining of shortness of breath with cough and blood-tinged sputum.  Chest x-ray on 07/04/2020 showed platelike opacity extending from the right hilum peripherally with some fullness in the right hilar region.  This was followed by CT scan of the chest, abdomen pelvis on 07/05/2020 and it showed a reticular nodular opacities seen in the right upper lobe.  The nodule along the major fissure in the right upper lobe measured 1.4 x 0.6 cm and an irregularly shaped right upper lobe nodule measuring 2.3 x 2.5 cm.  The scan also showed a right hilar mass abutting the superior aspect of the right mainstem bronchus and trachea measuring 3.9 x 4.3 x 5.2 cm.  There was no evidence of metastatic disease within the abdomen or pelvis.  The patient was seen by Dr. Valeta Harms and he underwent video bronchoscopy with endobronchial ultrasound in addition to cryotherapy and tumor debulking with lesion excision procedure on 07/09/2020.  The final pathology ([id number]) showed malignant cells consistent with squamous cell carcinoma.  The patient was referred to me today for evaluation and recommendation regarding treatment of his condition. When seen today the patient is feeling  fair with the only complains of back pain that has been going on for years and mild cough but no significant chest pain, shortness of breath or hemoptysis.  He has no nausea, vomiting, diarrhea or constipation.  He denied having any headache or visual changes. Family history significant for father who died from lung cancer.  Mother still alive and has multiple medical problems. The patient is a widow and has 2 children a son who lives locally and daughter lives in [place].  The patient used to work as a Recruitment consultant.  He has a history for smoking more than 1 pack/day for around 60 years and unfortunately he continues to smoke and trying to quit.  He also drank 1/5 of alcohol every week and no history of drug abuse. HPI  Past Medical History:  Diagnosis Date  . Anxiety   . Dyspnea    with exertion - has inhaler  . Frequent headaches   . GERD (gastroesophageal reflux disease)   . Hypertension   . Prostate cancer [facility])     Past Surgical History:  Procedure Laterality Date  . BRONCHIAL BRUSHINGS  07/09/2020   Procedure: BRONCHIAL BRUSHINGS;  Surgeon: Garner Nash, DO;  Location: [facility] [facility] ENDOSCOPY;  Service: Pulmonary;;  . BRONCHIAL WASHINGS  07/09/2020   Procedure: BRONCHIAL WASHINGS;  Surgeon: Garner Nash, DO;  Location: [place] ENDOSCOPY;  Service: Pulmonary;;  . CRYOTHERAPY  07/09/2020   Procedure: CRYOTHERAPY;  Surgeon: Garner Nash, DO;  Location: [facility] ENDOSCOPY;  Service: Pulmonary;;  . EYE SURGERY    . HEMOSTASIS CONTROL  07/09/2020   Procedure: HEMOSTASIS CONTROL;  Surgeon: Valeta Harms,  Octavio Graves, DO;  Location: [facility] ENDOSCOPY;  Service: Pulmonary;;  . PROSTATE BIOPSY     Prostate surgery 8-9 yrs ago  . TEE WITHOUT CARDIOVERSION N/A 07/12/2020   Procedure: TRANSESOPHAGEAL ECHOCARDIOGRAM (TEE);  Surgeon: Lelon Perla, MD;  Location: [facility] ENDOSCOPY;  Service: Cardiovascular;  Laterality: N/A;  . VIDEO BRONCHOSCOPY WITH ENDOBRONCHIAL ULTRASOUND Bilateral 07/09/2020   Procedure: VIDEO  BRONCHOSCOPY WITH ENDOBRONCHIAL ULTRASOUND;  Surgeon: Garner Nash, DO;  Location: [place];  Service: Pulmonary;  Laterality: Bilateral;    Family History  Problem Relation Age of Onset  . Cancer - Lung Father   . Heart disease Brother   . Diabetes type II Neg Hx   . Stroke Neg Hx     Social History Social History   Tobacco Use  . Smoking status: Current Every Day Smoker    Packs/day: 2.50    Years: 40.00    Pack years: 100.00    Types: Cigarettes  . Smokeless tobacco: Never Used  Vaping Use  . Vaping Use: Never used  Substance Use Topics  . Alcohol use: Yes    Alcohol/week: 1.0 standard drink    Types: 1 Cans of beer per week    Comment: couple of days a week  . Drug use: No    No Known Allergies  Current Outpatient Medications  Medication Sig Dispense Refill  . albuterol (PROVENTIL HFA;VENTOLIN HFA) 108 (90 BASE) MCG/ACT inhaler Inhale 2 puffs into the lungs every 6 (six) hours as needed for wheezing or shortness of breath. 1 Inhaler 0  . ALPRAZolam (XANAX) 0.5 MG tablet Take 1 tablet (0.5 mg total) by mouth 3 (three) times daily. LF on 06-25-20 # 90 DS 30 15 tablet 0  . aspirin 81 MG chewable tablet Chew 1 tablet (81 mg total) by mouth daily. (Patient not taking: Reported on 07/01/2020) 30 tablet 0  . atorvastatin (LIPITOR) 40 MG tablet Take 1 tablet (40 mg total) by mouth daily.    . cyclobenzaprine (FLEXERIL) 5 MG tablet Take 5 mg by mouth 3 (three) times daily as needed.    . folic acid (FOLVITE) 1 MG tablet Take 1 tablet (1 mg total) by mouth daily.    . metoprolol succinate (TOPROL-XL) 25 MG 24 hr tablet Take 25 mg by mouth daily.    . Multiple Vitamins-Minerals (MULTIVITAMIN WITH MINERALS) tablet Take 1 tablet by mouth daily.    Marland Kitchen omeprazole (PRILOSEC) 20 MG capsule Take 20 mg by mouth daily.    Marland Kitchen oxyCODONE-acetaminophen (PERCOCET) 10-325 MG tablet Take 1 tablet by mouth every 8 (eight) hours as needed for pain. 10 tablet 0  . polyethylene glycol (MIRALAX  / GLYCOLAX) 17 g packet Take 17 g by mouth daily.    Marland Kitchen thiamine 100 MG tablet Take 1 tablet (100 mg total) by mouth daily.     No current facility-administered medications for this visit.    Review of Systems  Constitutional: positive for fatigue Eyes: negative Ears, nose, mouth, throat, and face: negative Respiratory: positive for cough Cardiovascular: negative Gastrointestinal: negative Genitourinary:negative Integument/breast: negative Hematologic/lymphatic: negative Musculoskeletal:positive for back pain Neurological: negative Behavioral/Psych: negative Endocrine: negative Allergic/Immunologic: negative  Physical Exam  OYD:XAJOI, healthy, no distress, well nourished, well developed and anxious SKIN: skin color, texture, turgor are normal, no rashes or significant lesions HEAD: Normocephalic, No masses, lesions, tenderness or abnormalities EYES: normal, PERRLA, Conjunctiva are pink and non-injected EARS: External ears normal, Canals clear OROPHARYNX:no exudate, no erythema and lips, buccal mucosa, and tongue normal  NECK:  supple, no adenopathy, no JVD LYMPH:  no palpable lymphadenopathy, no hepatosplenomegaly LUNGS: clear to auscultation , and palpation HEART: regular rate & rhythm, no murmurs and no gallops ABDOMEN:abdomen soft, non-tender, normal bowel sounds and no masses or organomegaly BACK: No CVA tenderness, Range of motion is normal EXTREMITIES:no joint deformities, effusion, or inflammation, no edema  NEURO: alert & oriented x 3 with fluent speech, no focal motor/sensory deficits  PERFORMANCE STATUS: ECOG 1  LABORATORY DATA: Lab Results  Component Value Date   WBC 11.6 (H) 07/21/2020   HGB 13.6 07/21/2020   HCT 41.9 07/21/2020   MCV 93.7 07/21/2020   PLT 331 07/21/2020      Chemistry      Component Value Date/Time   NA 133 (L) 07/14/2020 0410   K 4.0 07/14/2020 0410   CL 103 07/14/2020 0410   CO2 24 07/14/2020 0410   BUN 12 07/14/2020 0410    CREATININE 0.67 07/14/2020 0410      Component Value Date/Time   CALCIUM 9.2 07/14/2020 0410   ALKPHOS 84 07/08/2020 0253   AST 18 07/08/2020 0253   ALT 17 07/08/2020 0253   BILITOT 0.6 07/08/2020 0253       RADIOGRAPHIC STUDIES: DG Chest 1 View  Result Date: 07/04/2020 CLINICAL DATA:  Shortness of breath. EXAM: CHEST  1 VIEW COMPARISON:  September 12, 2019 FINDINGS: Platelike opacity extending from the right hilum peripherally, similar since September 12, 2019 but new compared to 2015. Mild fullness in the right hilar region. Left lung is clear. No pneumothorax. No other acute abnormalities. IMPRESSION: Platelike opacity extending from the right hilum peripherally with some fullness in the right hilar region. Recommend CT imaging for better evaluation. These results will be called to the ordering clinician or representative by the Radiologist Assistant, and communication documented in the PACS or [employer]. Electronically Signed   By: Dorise Bullion III M.D   On: 07/04/2020 15:03   CT CHEST WO CONTRAST  Result Date: 07/05/2020 CLINICAL DATA:  Hemoptysis. EXAM: CT CHEST WITHOUT CONTRAST TECHNIQUE: Multidetector CT imaging of the chest was performed following the standard protocol without IV contrast. COMPARISON:  Plain film of the chest 07/05/2023. CT chest 12/13/2013. FINDINGS: Cardiovascular: No significant vascular findings. Normal heart size. No pericardial effusion. Calcific aortic and coronary atherosclerosis noted. Mediastinum/Nodes: A right hilar mass abutting the superior aspect of the right mainstem bronchus and trachea measures 3.9 cm AP x 4.3 cm transverse on image 67 of series 3 x 5.2 cm craniocaudal on coronal image 45, series 5. Amorphous calcifications are present within the lesion. The thyroid gland and esophagus appear normal. Lungs/Pleura: Reticulonodular opacities are seen in the right upper lobe. A nodule along the major fissure in the right upper lobe measures 1.4 cm transverse  by 0.6 cm AP on image 39 of series 4. An irregularly-shaped right upper lobe nodule measures 2.3 x 2.5 cm on image 42 of series 4. There is also some discoid atelectasis in the anterior right upper lobe which is likely postobstructive. Mild dependent atelectasis is seen in the left lung base. There is also some peribronchial thickening in the lower lobes bilaterally. Upper Abdomen: Negative. Musculoskeletal: No lytic or sclerotic lesion. Convex right scoliosis and multilevel degenerative disease noted. IMPRESSION: Right hilar mass consistent with carcinoma. 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 Reticulonodular opacities in the right upper lobe, a subpleural nodule along the major fissure and nodule in the upper lobe are most consistent with tumor  spread. Aortic Atherosclerosis (ICD10-I70.0). Calcific coronary artery disease also noted. Electronically Signed   By: Inge Rise M.D.   On: 07/05/2020 12:01   MR ANGIO HEAD WO CONTRAST  Result Date: 07/01/2020 CLINICAL DATA:  Stroke EXAM: MRA HEAD WITHOUT CONTRAST TECHNIQUE: Angiographic images of the Circle of Willis were obtained using MRA technique without intravenous contrast. COMPARISON:  MRI head 06/30/2020 FINDINGS: Internal carotid artery widely patent and normal bilaterally. Severe stenosis right M1 segment. There appears to be associated filling defect. This could be atherosclerotic disease and or thrombus. This extends to the right MCA bifurcation. Right M2 branches patent with decreased caliber compared to the left. Fenestrated left M1 segment. Left MCA widely patent. Both anterior cerebral arteries widely patent. Basilar widely patent. PICA patent bilaterally. Both vertebral arteries widely patent. Posterior cerebral arteries widely patent with fetal origin on the right. IMPRESSION: Severe stenosis right M1 segment. Associated filling defect may represent thrombus and or atherosclerotic plaque.  Decreased perfusion right MCA branches which remains patent. No other significant cranial stenosis Code stroke imaging results were communicated on 07/01/2020 at 2:11 pm to provider Erlinda Hong via text page Electronically Signed   By: Franchot Gallo M.D.   On: 07/01/2020 14:13   MR BRAIN WO CONTRAST  Result Date: 06/30/2020 CLINICAL DATA:  Transient ischemic attack. EXAM: MRI HEAD WITHOUT CONTRAST TECHNIQUE: Multiplanar, multiecho pulse sequences of the brain and surrounding structures were obtained without intravenous contrast. COMPARISON:  None. FINDINGS: Brain: Diffusion imaging shows acute infarction within the external capsule and caudate body on the right. No evidence of mass effect or hemorrhage. No other acute insult. Elsewhere, the brainstem is normal. Old small vessel cerebellar infarctions inferior on the left. 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 No sign of mass lesion, hemorrhage, hydrocephalus or extra-axial collection. Vascular: Major vessels at the base of the brain show flow. Skull and upper cervical spine: Negative Sinuses/Orbits: Clear/normal Other: None IMPRESSION: 1. Acute infarction affecting the external capsule and caudate body on the right. No mass effect or hemorrhage. 2. 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 Electronically Signed   By: Nelson Chimes M.D.   On: 06/30/2020 20:30   CT ABDOMEN PELVIS W CONTRAST  Result Date: 07/06/2020 CLINICAL DATA:  Lung cancer.  Novel diagnosis.  Staging examination. EXAM: CT ABDOMEN AND PELVIS WITH CONTRAST TECHNIQUE: Multidetector CT imaging of the abdomen and pelvis was performed using the standard protocol following bolus administration of intravenous contrast. CONTRAST:  162m OMNIPAQUE IOHEXOL 300 MG/ML  SOLN COMPARISON:  06/23/2004 FINDINGS: Lower chest: Minimal left basilar atelectasis. Mild coronary artery calcification. Hepatobiliary: No focal liver abnormality is seen. No gallstones, gallbladder wall thickening, or  biliary dilatation. Pancreas: Unremarkable Spleen: Unremarkable Adrenals/Urinary Tract: The adrenal glands are unremarkable. The kidneys are normal in size and position. Scattered areas of renal cortical atrophy have developed bilaterally,, more prevalent within the lower poles bilaterally. No enhancing intrarenal masses. No intrarenal or ureteral calcifications. No hydronephrosis. The bladder is unremarkable. Stomach/Bowel: Moderate sigmoid diverticulosis. The stomach, small bowel, and large bowel are otherwise unremarkable. Appendix normal. No free intraperitoneal gas or fluid. Vascular/Lymphatic: Extensive aortoiliac atherosclerotic calcification. 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 No pathologic adenopathy within the abdomen and pelvis. Reproductive: Prostate is unremarkable. Other: Tiny fat containing umbilical hernia.  Rectum unremarkable. Musculoskeletal: No lytic or blastic bone lesions are identified. Degenerative changes are noted throughout the visualized thoracolumbar spine. No  acute bone abnormality. IMPRESSION: No evidence of metastatic disease within the abdomen and pelvis. Interval development of 3.2 cm infrarenal abdominal aortic aneurysm Recommend follow-up ultrasound every 3 years. This recommendation follows ACR consensus guidelines: White Paper of the ACR Incidental Findings Committee II on Vascular Findings. J Am Coll Radiol 2013; 10:789-794. Moderate sigmoid diverticulosis. Aortic aneurysm NOS (ICD10-I71.9). Aortic Atherosclerosis (ICD10-I70.0). Electronically Signed   By: Fidela Salisbury MD   On: 07/06/2020 00:43   ECHOCARDIOGRAM COMPLETE  Result Date: 07/01/2020    ECHOCARDIOGRAM REPORT   Patient Name:   Cody Douglas Date of Exam: 07/01/2020 Medical Rec #:  [id number]      Height:       71.0 in Accession #:    [id number]     Weight:       170.0 lb Date of Birth:   [birth year]      BSA:          1.968 m Patient Age:    24 years       BP:           125/79 mmHg Patient Gender: M              HR:           77 bpm. Exam Location:  Inpatient Procedure: 2D Echo, Cardiac Doppler and Color Doppler Indications:    CVA  History:        Patient has no prior history of Echocardiogram examinations.                 Risk Factors:Hypertension.  Sonographer:    Luisa Hart RDCS Referring Phys: [id number] [place] Comments: No cardiac surgery or procedures noted in chart IMPRESSIONS  1. Left ventricular ejection fraction, by estimation, is 55 to 60%. The left ventricle has normal function. The left ventricle has no regional wall motion abnormalities. Left ventricular diastolic parameters are consistent with Grade I diastolic dysfunction (impaired relaxation).  2. Right ventricular systolic function is normal. The right ventricular size is normal. There is normal pulmonary artery systolic pressure.  3. Severe calcified bulky lesions on posterior and anterior leaflets Despite calcification edges/borders seem irregular and favor vegetations rather than MAC Suggest TEE to further evaluate. The mitral valve is abnormal. Mild mitral valve regurgitation.  No evidence of mitral stenosis.  4. Severe bulky calcification/ mass particularly on the Non coronary cusp and commisure between the right and non cusps. Again despite calcification edges irregular and suggest vegetations TEE and cardiology consult suggested . The aortic valve is abnormal. Aortic valve regurgitation is mild. No aortic stenosis is present.  5. The inferior vena cava is normal in size with greater than 50% respiratory variability, suggesting right atrial pressure of 3 mmHg. FINDINGS  Left Ventricle: Left ventricular ejection fraction, by estimation, is 55 to 60%. The left ventricle has normal function. The left ventricle has no regional wall motion abnormalities. The left ventricular internal cavity size was normal  in size. There is  no left ventricular hypertrophy. Left ventricular diastolic parameters are consistent with Grade I diastolic dysfunction (impaired relaxation). Right Ventricle: The right ventricular size is normal. No increase in right ventricular wall thickness. Right ventricular systolic function is normal. There is normal pulmonary artery systolic pressure. The tricuspid regurgitant velocity is 2.56 m/s, and  with an assumed right atrial pressure of 3 mmHg, the estimated right ventricular systolic pressure is 52.7 mmHg. Left Atrium: Left atrial size was normal in size. Right Atrium: Right atrial  size was normal in size. Pericardium: There is no evidence of pericardial effusion. Mitral Valve: Severe calcified bulky lesions on posterior and anterior leaflets Despite calcification edges/borders seem irregular and favor vegetations rather than MAC Suggest TEE to further evaluate. The mitral valve is abnormal. Mild mitral valve regurgitation. No evidence of mitral valve stenosis. MV peak gradient, 8.1 mmHg. The mean mitral valve gradient is 4.0 mmHg. Tricuspid Valve: The tricuspid valve is normal in structure. Tricuspid valve regurgitation is not demonstrated. No evidence of tricuspid stenosis. Aortic Valve: Severe bulky calcification/ mass particularly on the Non coronary cusp and commisure between the right and non cusps. Again despite calcification edges irregular and suggest vegetations TEE and cardiology consult suggested. The aortic valve  is abnormal. Aortic valve regurgitation is mild. Aortic regurgitation PHT measures 385 msec. No aortic stenosis is present. Aortic valve mean gradient measures 9.5 mmHg. Aortic valve peak gradient measures 15.9 mmHg. Aortic valve area, by VTI measures 5.87 cm. Pulmonic Valve: The pulmonic valve was normal in structure. Pulmonic valve regurgitation is not visualized. No evidence of pulmonic stenosis. Aorta: The aortic root is normal in size and structure. Venous: The  inferior vena cava is normal in size with greater than 50% respiratory variability, suggesting right atrial pressure of 3 mmHg. IAS/Shunts: No atrial level shunt detected by color flow Doppler.  LEFT VENTRICLE PLAX 2D LVIDd:         5.20 cm     Diastology LVIDs:         4.00 cm     LV e' medial:    4.35 cm/s LV PW:         0.70 cm     LV E/e' medial:  21.0 LV IVS:        1.00 cm     LV e' lateral:   7.62 cm/s LVOT diam:     2.70 cm     LV E/e' lateral: 12.0 LV SV:         226 LV SV Index:   115 LVOT Area:     5.73 cm  LV Volumes (MOD) LV vol d, MOD A2C: 79.2 ml LV vol d, MOD A4C: 56.4 ml LV vol s, MOD A2C: 29.7 ml LV vol s, MOD A4C: 21.6 ml LV SV MOD A2C:     49.5 ml LV SV MOD A4C:     56.4 ml LV SV MOD BP:      48.8 ml RIGHT VENTRICLE RV S prime:     12.10 cm/s TAPSE (M-mode): 1.5 cm LEFT ATRIUM             Index       RIGHT ATRIUM           Index LA diam:        3.60 cm 1.83 cm/m  RA Area:     13.80 cm LA Vol (A2C):   31.8 ml 16.16 ml/m RA Volume:   32.60 ml  16.57 ml/m LA Vol (A4C):   46.7 ml 23.73 ml/m LA Biplane Vol: 39.5 ml 20.07 ml/m  AORTIC VALVE                    PULMONIC VALVE AV Area (Vmax):    5.85 cm     PV Vmax:       0.66 m/s AV Area (Vmean):   5.39 cm     PV Peak grad:  1.8 mmHg AV Area (VTI):     5.87 cm AV Vmax:  199.50 cm/s AV Vmean:          143.500 cm/s AV VTI:            0.384 m AV Peak Grad:      15.9 mmHg AV Mean Grad:      9.5 mmHg LVOT Vmax:         204.00 cm/s LVOT Vmean:        135.000 cm/s LVOT VTI:          0.394 m LVOT/AV VTI ratio: 1.03 AI PHT:            385 msec  AORTA Ao Root diam: 4.00 cm MITRAL VALVE                TRICUSPID VALVE MV Area (PHT): 2.09 cm     TR Peak grad:   26.2 mmHg MV Area VTI:   5.32 cm     TR Vmax:        256.00 cm/s MV Peak grad:  8.1 mmHg MV Mean grad:  4.0 mmHg     SHUNTS MV Vmax:       1.42 m/s     Systemic VTI:  0.39 m MV Vmean:      90.8 cm/s    Systemic Diam: 2.70 cm MV Decel Time: 363 msec MR Peak grad: 94.5 mmHg MR Vmax:       486.00 cm/s MV E velocity: 91.30 cm/s MV A velocity: 145.00 cm/s MV E/A ratio:  0.63 Jenkins Rouge MD Electronically signed by Jenkins Rouge MD Signature Date/Time: 07/01/2020/10:07:46 AM    Final    ECHO TEE  Result Date: 07/12/2020    TRANSESOPHOGEAL ECHO REPORT   Patient Name:   Cody Douglas Date of Exam: 07/12/2020 Medical Rec #:  [id number]      Height:       71.0 in Accession #:    [id number]     Weight:       160.0 lb Date of Birth:  05-Jun-1950      BSA:          1.918 m Patient Age:    61 years       BP:           132/70 mmHg Patient Gender: M              HR:           71 bpm. Exam Location:  Inpatient Procedure: Transesophageal Echo, Cardiac Doppler and Color Doppler Indications:     CVA  History:         Patient has prior history of Echocardiogram examinations, most                  recent 07/01/2020. Cardiomyopathy, Stroke, Mitral Valve Disease;                  Risk Factors:Hypertension.  Sonographer:     Mikki Santee RDCS (AE) Referring Phys:  [id number] Abigail Butts Diagnosing Phys: Kirk Ruths MD PROCEDURE: The transesophogeal probe was passed without difficulty through the esophogus of the patient. Sedation performed by different physician. The patient was monitored while under deep sedation. Anesthestetic sedation was provided intravenously by Anesthesiology: 182.67m of Propofol, 673mof Lidocaine. The patient developed no complications during the procedure. IMPRESSIONS  1. 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  2. Left ventricular ejection fraction, by  estimation, is 55 to 60%. The left ventricle has normal function.  3. Right ventricular systolic function is normal. The right ventricular size is normal.  4. No left atrial/left atrial appendage thrombus was detected.  5. The mitral valve is abnormal. Mild mitral valve regurgitation. Moderate mitral annular  calcification.  6. The aortic valve is tricuspid. Aortic valve regurgitation is mild to moderate. No aortic stenosis is present.  7. Aortic dilatation noted. There is mild dilatation of the aortic root, measuring 41 mm. There is Moderate (Grade III) plaque involving the descending aorta.  8. Evidence of atrial level shunting detected by color flow Doppler. Agitated saline contrast bubble study was positive with shunting observed after >6 cardiac cycles suggestive of intrapulmonary shunting. FINDINGS  Left Ventricle: Left ventricular ejection fraction, by estimation, is 55 to 60%. The left ventricle has normal function. The left ventricular internal cavity size was normal in size. Right Ventricle: The right ventricular size is normal. Right vetricular wall thickness was not assessed. Right ventricular systolic function is normal. Left Atrium: Left atrial size was normal in size. No left atrial/left atrial appendage thrombus was detected. Right Atrium: Right atrial size was normal in size. Pericardium: There is no evidence of pericardial effusion. Mitral Valve: The mitral valve is abnormal. There is mild calcification of the mitral valve leaflet(s). Moderate mitral annular calcification. Mild mitral valve regurgitation. Tricuspid Valve: The tricuspid valve is normal in structure. Tricuspid valve regurgitation is mild. Aortic Valve: The aortic valve is tricuspid. Aortic valve regurgitation is mild to moderate. Aortic regurgitation PHT measures 965 msec. No aortic stenosis is present. Aortic valve mean gradient measures 5.0 mmHg. Aortic valve peak gradient measures 9.5 mmHg. Pulmonic Valve: The pulmonic valve was normal in structure. Pulmonic valve regurgitation is trivial. Aorta: Aortic dilatation noted. There is mild dilatation of the aortic root, measuring 41 mm. There is moderate (Grade III) plaque involving the descending aorta. IAS/Shunts: Evidence of atrial level shunting detected by color flow Doppler. Agitated  saline contrast bubble study was positive with shunting observed after >6 cardiac cycles suggestive of intrapulmonary shunting. 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  AORTIC VALVE AV Vmax:      154.00 cm/s AV Vmean:     101.000 cm/s AV VTI:       0.303 m AV Peak Grad: 9.5 mmHg AV Mean Grad: 5.0 mmHg AI PHT:       965 msec  AORTA Ao Root diam: 3.90 cm Kirk Ruths MD Electronically signed by Kirk Ruths MD Signature Date/Time: 07/12/2020/2:29:38 PM    Final    VAS US CAROTID (at [facility] and [facility] only)  Result Date: 07/01/2020 Carotid Arterial Duplex Study Indications:       CVA and Slurred speech & facial droop (left). Risk Factors:      Hypertension, current smoker. Other Factors:     ETOH abuse. Comparison Study:  No previous exams Performing Technologist: Rogelia Rohrer  Examination Guidelines: A complete evaluation includes B-mode imaging, spectral Doppler, color Doppler, and power Doppler as needed of all accessible portions of each vessel. Bilateral testing is considered an integral part of a complete examination. Limited examinations for reoccurring indications may be performed as noted.  Right Carotid Findings: +----------+--------+--------+--------+------------------+------------------+           PSV cm/sEDV cm/sStenosisPlaque DescriptionComments           +----------+--------+--------+--------+------------------+------------------+ CCA Prox  83  12                                intimal thickening +----------+--------+--------+--------+------------------+------------------+ CCA Distal86      16                                intimal thickening +----------+--------+--------+--------+------------------+------------------+ ICA Prox  53      10                                                    +----------+--------+--------+--------+------------------+------------------+ ICA Distal58      16                                                   +----------+--------+--------+--------+------------------+------------------+ ECA       125     8                                                    +----------+--------+--------+--------+------------------+------------------+ +----------+--------+-------+----------------+-------------------+           PSV cm/sEDV cmsDescribe        Arm Pressure (mmHG) +----------+--------+-------+----------------+-------------------+ [id number]             Multiphasic, WNL                    +----------+--------+-------+----------------+-------------------+ +---------+--------+--+--------+-+---------+ VertebralPSV cm/s35EDV cm/s7Antegrade +---------+--------+--+--------+-+---------+  Left Carotid Findings: +----------+--------+--------+--------+------------------+------------------+           PSV cm/sEDV cm/sStenosisPlaque DescriptionComments           +----------+--------+--------+--------+------------------+------------------+ CCA Prox  84      15                                intimal thickening +----------+--------+--------+--------+------------------+------------------+ CCA Distal80      19                                intimal thickening +----------+--------+--------+--------+------------------+------------------+ ICA Prox  64      20                                                   +----------+--------+--------+--------+------------------+------------------+ ICA Distal76      26                                                   +----------+--------+--------+--------+------------------+------------------+ ECA       179     19                                                   +----------+--------+--------+--------+------------------+------------------+  +----------+--------+--------+----------------+-------------------+  PSV cm/sEDV cm/sDescribe        Arm Pressure (mmHG) +----------+--------+--------+----------------+-------------------+ [id number]              Multiphasic, WNL                    +----------+--------+--------+----------------+-------------------+ +---------+--------+--------+--------------+ VertebralPSV cm/sEDV cm/sNot identified +---------+--------+--------+--------------+   Summary: Right Carotid: The extracranial vessels were near-normal with only minimal wall                thickening or plaque. Left Carotid: The extracranial vessels were near-normal with only minimal wall               thickening or plaque. Vertebrals:  Right vertebral artery demonstrates antegrade flow. Left vertebral              artery was not visualized. Subclavians: Normal flow hemodynamics were seen in bilateral subclavian              arteries. *See table(s) above for measurements and observations.  Electronically signed by Harold Barban MD on 07/01/2020 at 9:27:56 PM.    Final     ASSESSMENT: This is a very pleasant 70 years old white male recently diagnosed with a stage IIIA (T3, N1, M0) non-small cell lung cancer, squamous cell carcinoma diagnosed in April 2022 and presented with right upper lobe lung nodules in addition to right hilar lymphadenopathy.   PLAN: I had a lengthy discussion with the patient today about his current disease stage, prognosis and treatment options. I personally and independently reviewed his scan images and discussed the result and showed the images to the patient today. I recommended for the Patient to keep the appointment for the PET scan on 07/27/2020. He had MRI of the brain performed previously and it showed the evidence for acute infarction but no evidence of metastatic disease. I discussed with the patient his treatment options and in the absence of any metastatic disease, he may benefit from a  course of concurrent chemoradiation with weekly carboplatin for AUC of 2 and paclitaxel 45 Mg/M2 for 6-7 weeks and this will be followed by consolidation immunotherapy if the patient has no evidence for disease progression after the induction phase. I discussed with the patient the adverse effect of this treatment including but not limited to alopecia, myelosuppression, nausea and vomiting, peripheral neuropathy, liver or renal dysfunction. He is expected to start the first cycle of his treatment on 08/02/2020. I will arrange for the patient to have a chemotherapy education class before the first dose of his treatment. I will call his pharmacy with prescription for Compazine 10 mg p.o. every 6 hours as needed for nausea. The patient will come back for follow-up visit on the first day of his treatment. For the smoking cessation I strongly encouraged the patient to quit smoking. There was advised to call immediately if he has any other concerning symptoms in the interval. The patient voices understanding of current disease status and treatment options and is in agreement with the current care plan.  All questions were answered. The patient knows to call the clinic with any problems, questions or concerns. We can certainly see the patient much sooner if necessary.  Thank you so much for allowing me to participate in the care of Cody Douglas. I will continue to follow up the patient with you and assist in his care.  The total time spent in the appointment was 90 minutes.  Disclaimer: This note was dictated with voice recognition software. Similar  sounding words can inadvertently be transcribed and may not be corrected upon review.   Cody Douglas Jul 21, 2020, 2:09 PM

## 2020-07-21 NOTE — Progress Notes (Signed)
Med Reconciliation-Pt is unable to tell me what meds he is taking at [facility] except stating  " I just take what they give me" Med list reconciled with [facility] printed list.

## 2020-07-22 ENCOUNTER — Other Ambulatory Visit: Payer: Self-pay | Admitting: *Deleted

## 2020-07-22 NOTE — Progress Notes (Signed)
The proposed treatment discussed in cancer conference is for discussion purpose only and is not a binding recommendation. The patient was not physically examined nor present for their treatment options. Therefore, final treatment plans cannot be decided.  ?

## 2020-07-23 ENCOUNTER — Encounter: Payer: Self-pay | Admitting: *Deleted

## 2020-07-23 NOTE — Progress Notes (Signed)
I followed up on Mr. Knope's schedule.  His plan of care is set up with appts. I called [facility] to update. Their transportation coordinator was out but I did leave my name and phone number to call to update them on appts.

## 2020-07-26 ENCOUNTER — Telehealth: Payer: Self-pay | Admitting: *Deleted

## 2020-07-26 ENCOUNTER — Encounter: Payer: Self-pay | Admitting: *Deleted

## 2020-07-26 NOTE — Telephone Encounter (Signed)
I received a call from Mr. [facility]akenbush's facility.  They needed clarification on appts.  I clarified.

## 2020-07-26 NOTE — Progress Notes (Signed)
I spoke to SNF today.  I update them on Cody Douglas's several appts.  They requested a calender of all appts.  I faxed per their request with return fax received.

## 2020-07-27 ENCOUNTER — Encounter (HOSPITAL_COMMUNITY)
Admission: RE | Admit: 2020-07-27 | Discharge: 2020-07-27 | Disposition: A | Discharge status: Home / Self Care | Payer: Medicare Other | Source: Ambulatory Visit | Attending: Radiation Oncology | Admitting: Radiation Oncology

## 2020-07-27 ENCOUNTER — Other Ambulatory Visit: Payer: Self-pay

## 2020-07-27 DIAGNOSIS — C3490 Malignant neoplasm of unspecified part of unspecified bronchus or lung: Secondary | ICD-10-CM | POA: Diagnosis not present

## 2020-07-27 DIAGNOSIS — Z8546 Personal history of malignant neoplasm of prostate: Secondary | ICD-10-CM | POA: Insufficient documentation

## 2020-07-27 DIAGNOSIS — I700 Atherosclerosis of aorta: Secondary | ICD-10-CM | POA: Diagnosis not present

## 2020-07-27 LAB — GLUCOSE, CAPILLARY: Glucose-Capillary: 116 mg/dL — ABNORMAL HIGH (ref 70–99)

## 2020-07-27 IMAGING — CT NM PET TUM IMG INITIAL (PI) SKULL BASE T - THIGH
7 of 8 series · 19 of 25 positions shown · non-contrast
Comparison: Chest abdomen pelvis CT [DATE].

CLINICAL DATA: Initial treatment strategy for 69-year-old male with
history of non-small cell lung cancer, squamous cell carcinoma also
with history of prostate cancer. History of bronchoscopy with biopsy
and cryotherapy on [DATE].

EXAM:
NUCLEAR MEDICINE PET SKULL BASE TO THIGH
TECHNIQUE: 7.26 mCi F-18 FDG was injected intravenously. Full-ring PET imaging
was performed from the skull base to thigh after the radiotracer. CT
data was obtained and used for attenuation correction and anatomic
localization.
Fasting blood glucose: 116 mg/dl

[Series 3: pet sk_thigh ac · axial · 5.0mm · 4.07mm/px · z∈[-1352,-420]mm · 4 of 234 slices shown]
[im 1/234]
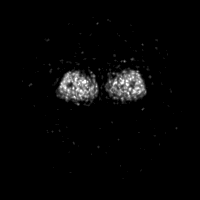
[im 59/234]
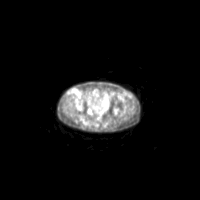
[im 175/234]
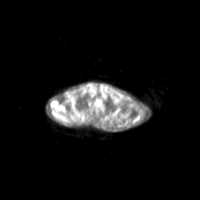
[im 234/234]
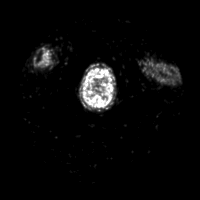

[Series 4: ct sk_thigh 5.0 bf37 · axial · 5.0mm · 0.98mm/px · z∈[-1352,-420]mm · 4 of 234 slices shown]
[im 1/234]
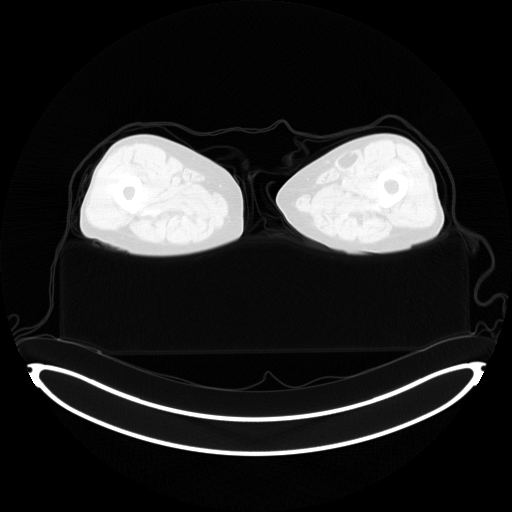
[im 117/234]
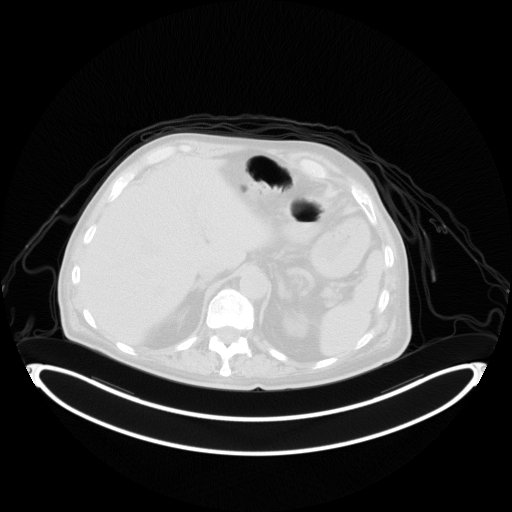
[im 175/234  brain]
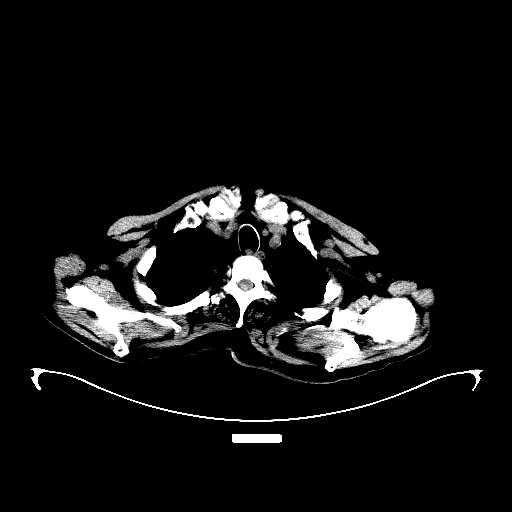
[im 234/234  brain]
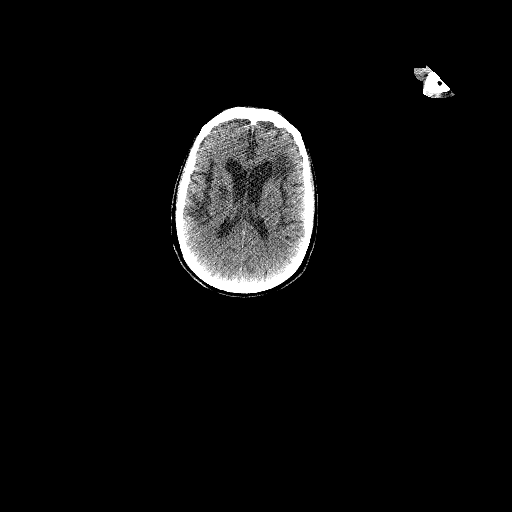

[Series 5: pet sk_thigh nac · axial · 5.0mm · 4.07mm/px · z∈[-1120,-656]mm · 3 of 234 slices shown]
[im 59/234]
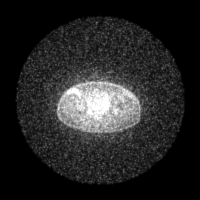
[im 117/234]
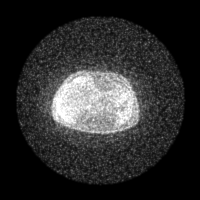
[im 175/234]
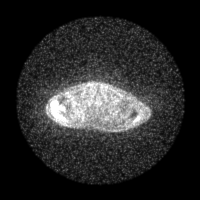

[Series 8: ct sk_thigh 5.0 br59 lung_bone · axial · 5.0mm · 0.68mm/px · z∈[-871,-607]mm · 2 of 67 slices shown]
[im 1/67]
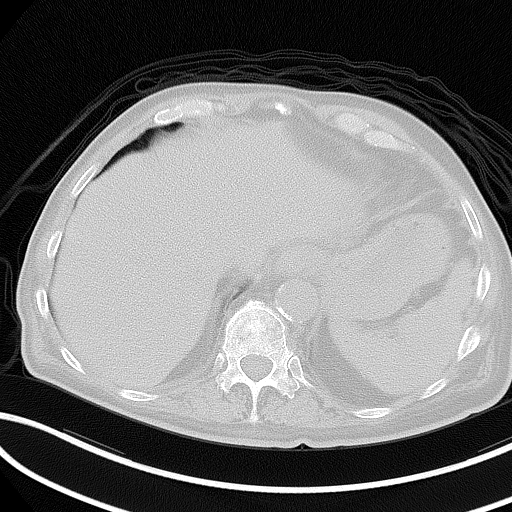
[im 67/67]
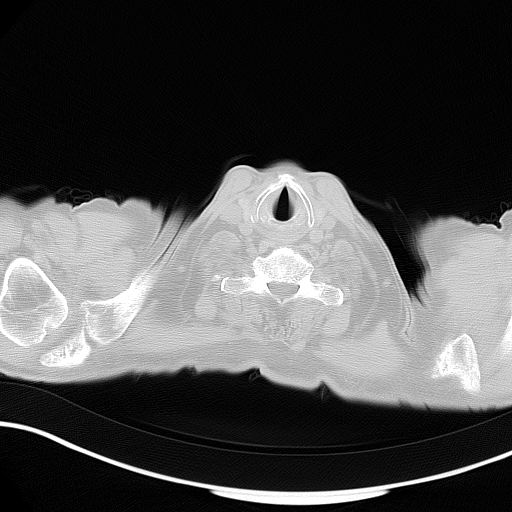

[Series 603: <mip collection> · coronal · 1.93mm/px · 1 of 32 slices shown]
[im 1/32]
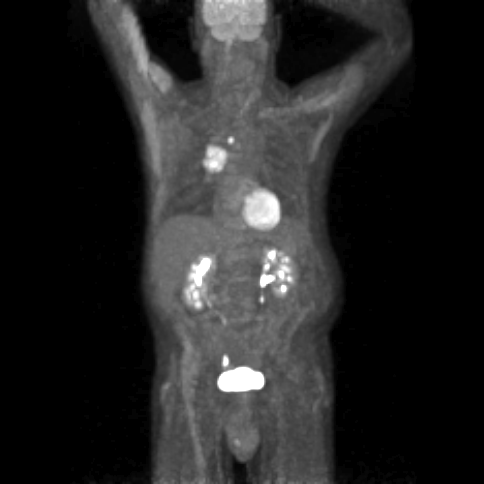

[Series 605: range-ct sk_thigh 5.0 bf37-tra-<alpha range> · 4 of 227 slices shown]
[im 1/227]
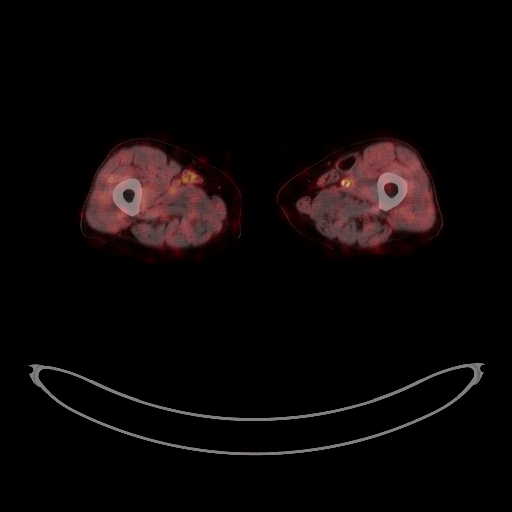
[im 57/227]
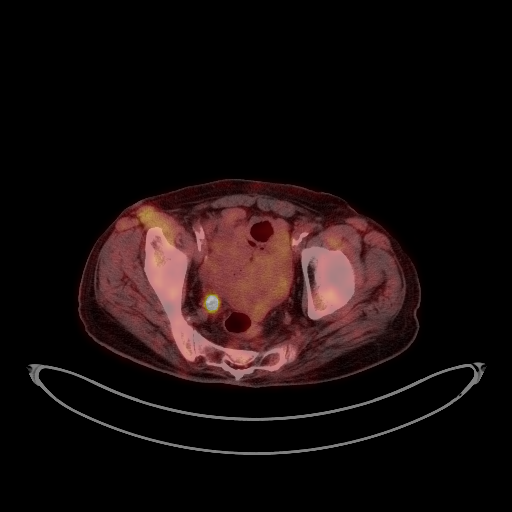
[im 114/227]
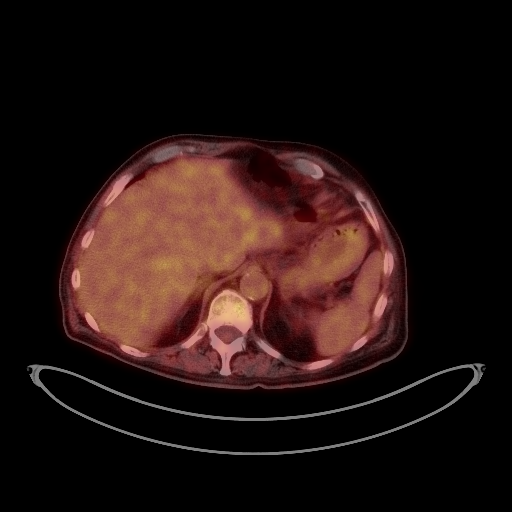
[im 227/227]
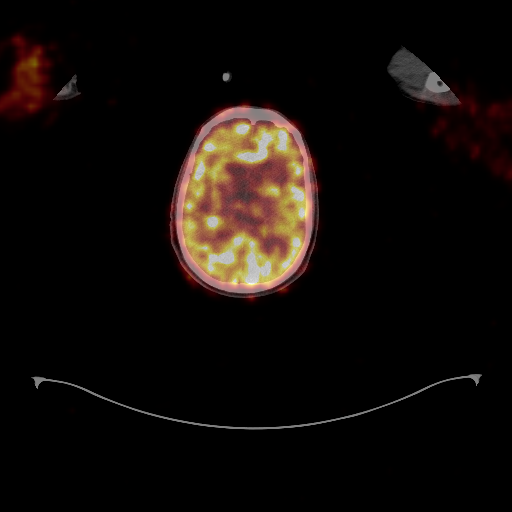

[Series 1063: results mm oncology reading · 1.0mm · 0.50mm/px · 1 of 4 slices shown]
[im 1/4]
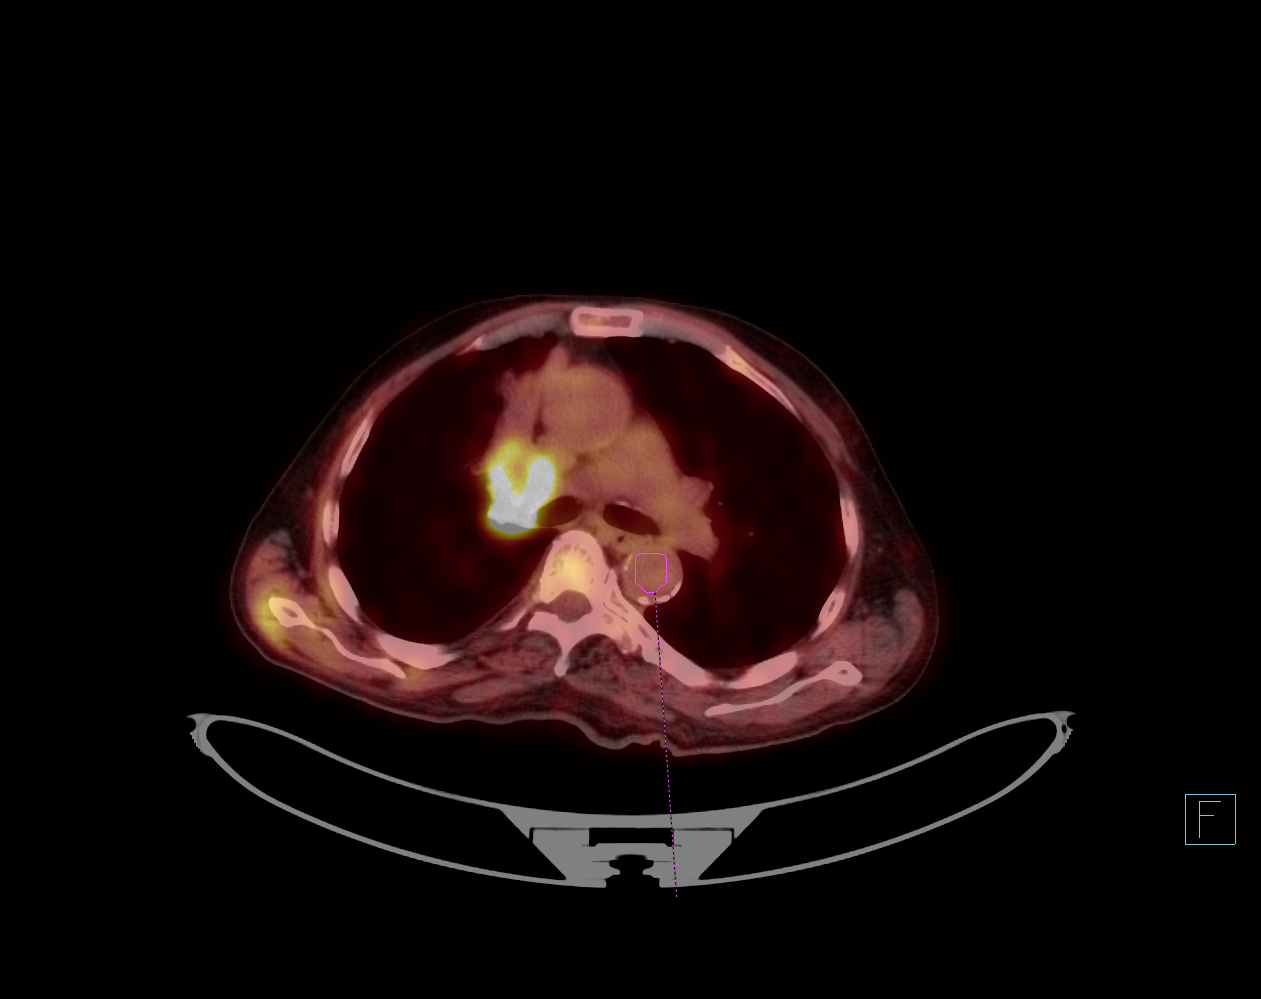

[19 of 25 positions shown; findings below may reference images not displayed]

FINDINGS: Mediastinal blood pool activity: SUV max

Liver activity: SUV max NA

NECK: No hypermetabolic lymph nodes in the neck.

Incidental CT findings: Signs of prior RIGHT MCA infarct
incompletely evaluated.

CHEST: RIGHT hilar mass as on the recent CT of the chest with
associated volume loss in the RIGHT middle lobe and RIGHT upper
lobe. Mass at the level of the hilum measuring approximately 4.3 x
1.5 cm showing intense FDG uptake. Size not substantially changed
compared to recent CT evaluation. Maximum SUV on the current exam of
[DATE].

RIGHT paratracheal lymph node measuring 0.9 cm on image 68 of series
4 with intense FDG uptake. Maximum SUV 13.8. (Image 70/3)

Nodularity and septal thickening at the RIGHT lung apex is similar
to the prior exam. This displays mildly increased metabolic activity
with a maximum SUV of 2.8 (image 68/3) no effusion.

Nodular areas along the pleura, largest in the RIGHT upper lobe is
contiguous with bandlike changes but measures 1 x 0.6 cm also
showing low level FDG uptake as does ground-glass surrounding
bandlike changes that extend into the RIGHT upper lobe with similar
uptake to the dominant area measuring 3.3 x 1.3 cm on image 68 of
series 4. FDG uptake is outlined above a 2.88.

Incidental CT findings: Calcified atheromatous plaque in the
thoracic aorta without aneurysmal dilation. Normal caliber central
pulmonary vasculature. Signs of coronary artery disease as before.
Normal heart size without substantial pericardial effusion.

ABDOMEN/PELVIS: No signs of abnormal uptake in solid organs or nodal
pathways in the abdomen or pelvis to indicate metastatic disease.

Incidental CT findings: No acute findings relative to liver,
gallbladder, pancreas, spleen, adrenal glands and kidneys. Urinary
bladder is decompressed.

No acute findings relative to bowel.  Colonic diverticulosis.

Aortic atherosclerosis with mild aneurysmal caliber of the
infrarenal abdominal aorta approximately 2.9 cm unchanged from
previous imaging.

Mild stranding about the vas deferens in the RIGHT hemipelvis with
expansion of the vas deferens when compared to the previous CT of
the abdomen and pelvis from [DATE]. There is intense
metabolic activity adjacent to the vast difference favored to
represent the RIGHT ureter. However, slightly away from this there
is mildly increased metabolic activity in this location. No
significant FDG uptake in the area of the prostate.

SKELETON: No focal hypermetabolic activity to suggest skeletal
metastasis.

Incidental CT findings: Spinal degenerative changes.
IMPRESSION: Signs of large RIGHT hilar mass with intense metabolic activity and
associated RIGHT paratracheal adenopathy.

Irregular RIGHT hilar mass also with areas of septal thickening and
nodularity in the RIGHT upper lobe raising the question of
lymphangitic carcinomatosis. FDG uptake in this area is lower level.
Morphologic features remain suspicious. An overlay of
postobstructive pneumonitis is also considered.

Above mass extends into mediastinal fat from the RIGHT hilum and
continues to show volume loss in the adjacent RIGHT upper lobe.

No additional signs of metastatic disease.

Stranding around the RIGHT vas deferens (image 181/4 with low level
metabolic activity difficult to separate from adjacent ureter in
terms of metabolic activity. Findings represent a change from the
recent study inter favored to represent an inflammatory process.
Correlate with any pelvic symptoms and consider interval follow-up
contrasted imaging given the patient's history of prostate neoplasm.

3.0 cm dilation of the abdominal aorta. Recommend follow-up every 3
years. This recommendation follows ACR consensus guidelines: White
Paper of the ACR Incidental Findings Committee II on Vascular
Findings. [HOSPITAL] [LV]; [DATE].

Aortic Atherosclerosis ([LV]-[LV]).

## 2020-07-27 MED ORDER — FLUDEOXYGLUCOSE F - 18 (FDG) INJECTION
7.5000 | Freq: Once | INTRAVENOUS | Status: AC
  Administered 2020-07-27: 7.26 via INTRAVENOUS

## 2020-07-28 ENCOUNTER — Encounter: Payer: Self-pay | Admitting: Radiation Oncology

## 2020-07-28 ENCOUNTER — Ambulatory Visit
Admission: RE | Admit: 2020-07-28 | Discharge: 2020-07-28 | Disposition: A | Discharge status: Home / Self Care | Payer: Medicare Other | Source: Ambulatory Visit | Attending: Radiation Oncology | Admitting: Radiation Oncology

## 2020-07-28 ENCOUNTER — Ambulatory Visit: Payer: Medicare Other | Admitting: Radiation Oncology

## 2020-07-28 ENCOUNTER — Ambulatory Visit: Payer: Medicare Other

## 2020-07-28 VITALS — BP 126/69 | HR 79 | Temp 97.7°F | Resp 20 | Ht 71.0 in | Wt 145.6 lb

## 2020-07-28 DIAGNOSIS — C3411 Malignant neoplasm of upper lobe, right bronchus or lung: Secondary | ICD-10-CM | POA: Insufficient documentation

## 2020-07-28 DIAGNOSIS — Z7982 Long term (current) use of aspirin: Secondary | ICD-10-CM | POA: Insufficient documentation

## 2020-07-28 DIAGNOSIS — I10 Essential (primary) hypertension: Secondary | ICD-10-CM | POA: Insufficient documentation

## 2020-07-28 DIAGNOSIS — Z510 Encounter for antineoplastic radiation therapy: Secondary | ICD-10-CM | POA: Insufficient documentation

## 2020-07-28 DIAGNOSIS — Z79899 Other long term (current) drug therapy: Secondary | ICD-10-CM | POA: Diagnosis not present

## 2020-07-28 DIAGNOSIS — K5730 Diverticulosis of large intestine without perforation or abscess without bleeding: Secondary | ICD-10-CM | POA: Insufficient documentation

## 2020-07-28 DIAGNOSIS — R0602 Shortness of breath: Secondary | ICD-10-CM | POA: Insufficient documentation

## 2020-07-28 DIAGNOSIS — Z8673 Personal history of transient ischemic attack (TIA), and cerebral infarction without residual deficits: Secondary | ICD-10-CM | POA: Diagnosis not present

## 2020-07-28 DIAGNOSIS — I714 Abdominal aortic aneurysm, without rupture: Secondary | ICD-10-CM | POA: Diagnosis not present

## 2020-07-28 DIAGNOSIS — I700 Atherosclerosis of aorta: Secondary | ICD-10-CM | POA: Insufficient documentation

## 2020-07-28 DIAGNOSIS — Z801 Family history of malignant neoplasm of trachea, bronchus and lung: Secondary | ICD-10-CM | POA: Insufficient documentation

## 2020-07-28 DIAGNOSIS — F17210 Nicotine dependence, cigarettes, uncomplicated: Secondary | ICD-10-CM | POA: Insufficient documentation

## 2020-07-28 DIAGNOSIS — R319 Hematuria, unspecified: Secondary | ICD-10-CM | POA: Diagnosis not present

## 2020-07-28 DIAGNOSIS — K219 Gastro-esophageal reflux disease without esophagitis: Secondary | ICD-10-CM | POA: Diagnosis not present

## 2020-07-28 DIAGNOSIS — Z8546 Personal history of malignant neoplasm of prostate: Secondary | ICD-10-CM | POA: Diagnosis not present

## 2020-07-28 DIAGNOSIS — I083 Combined rheumatic disorders of mitral, aortic and tricuspid valves: Secondary | ICD-10-CM | POA: Insufficient documentation

## 2020-07-28 DIAGNOSIS — F419 Anxiety disorder, unspecified: Secondary | ICD-10-CM | POA: Insufficient documentation

## 2020-07-28 HISTORY — DX: Cerebral infarction, unspecified: I63.9

## 2020-07-28 NOTE — Progress Notes (Signed)
Radiation Oncology         [phone] ________________________________  Name: Cody Douglas        MRN: [id number]  Date of Service: 07/28/2020 DOB: May 24, 1950  IO:[id number], Jeneen Rinks, MD  Curt Bears, MD     REFERRING PHYSICIAN: Curt Bears, MD   DIAGNOSIS: The encounter diagnosis was Malignant neoplasm of upper lobe of right lung [facility]).   HISTORY OF PRESENT ILLNESS: Cody Douglas is a 70 y.o. male seen at the request of  Dr. Julien Nordmann for a newly diagnosed lung cancer. The patient presented with an acute stroke and during his work up noted hemoptysis. A CT of the chest on 07/05/20 showed a 1.4 cm mass in the RUL, as well as a right hilar mass measuring 2.5 cm in greatest dimension as well as subcarinal adenopathy measuring up to 4.3 cm. He underwent bronchoscopy with EUS with Dr. 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 During the procedure Dr. Valeta Harms noted visible tumor into the right mainstem with 75% occlusion.  I discussed his case with the patient and his daughter while he was hospitalized. He's proceeded with outpatient PET yesterday which shows known disease in the RUL with nodularity of the septum and hilum and postopstructive pneumonitis. He does have right vas deferens activity but will be seeing [facility] as well given recent hematuria; 3 cm AAA was also seen. He's seen today to consider chemoRT.    PREVIOUS RADIATION THERAPY: No   PAST MEDICAL HISTORY:  Past Medical History:  Diagnosis Date  . Anxiety   . Dyspnea    with exertion - has inhaler  . Frequent headaches   . GERD (gastroesophageal reflux disease)   . Hypertension   . Prostate cancer ([place])        PAST SURGICAL HISTORY: Past Surgical History:  Procedure Laterality Date  . BRONCHIAL BRUSHINGS  07/09/2020   Procedure: BRONCHIAL BRUSHINGS;  Surgeon: Garner Nash, DO;  Location: [facility];  Service: Pulmonary;;   . BRONCHIAL WASHINGS  07/09/2020   Procedure: BRONCHIAL WASHINGS;  Surgeon: Garner Nash, DO;  Location: [facility];  Service: Pulmonary;;  . CRYOTHERAPY  07/09/2020   Procedure: CRYOTHERAPY;  Surgeon: Garner Nash, DO;  Location: [facility];  Service: Pulmonary;;  . EYE SURGERY    . HEMOSTASIS CONTROL  07/09/2020   Procedure: HEMOSTASIS CONTROL;  Surgeon: Garner Nash, DO;  Location: [facility];  Service: Pulmonary;;  . PROSTATE BIOPSY     Prostate surgery 8-9 yrs ago  . TEE WITHOUT CARDIOVERSION N/A 07/12/2020   Procedure: TRANSESOPHAGEAL ECHOCARDIOGRAM (TEE);  Surgeon: Lelon Perla, MD;  Location: [facility];  Service: Cardiovascular;  Laterality: N/A;  . VIDEO BRONCHOSCOPY WITH ENDOBRONCHIAL ULTRASOUND Bilateral 07/09/2020   Procedure: VIDEO BRONCHOSCOPY WITH ENDOBRONCHIAL ULTRASOUND;  Surgeon: Garner Nash, DO;  Location: [place];  Service: Pulmonary;  Laterality: Bilateral;     FAMILY HISTORY:  Family History  Problem Relation Age of Onset  . Cancer - Lung Father   . Heart disease Brother   . Diabetes type II Neg Hx   . Stroke Neg Hx      SOCIAL HISTORY:  reports that he has been smoking cigarettes. He has a 100.00 pack-year smoking history. He has never used smokeless tobacco. He reports current alcohol use of about 1.0 standard drink of alcohol per week. He reports that he does not use drugs. The patient is widowed and  lives in [place]. He is retired from working as a Recruitment consultant.    ALLERGIES: Patient has no known allergies.   MEDICATIONS:  Current Outpatient Medications  Medication Sig Dispense Refill  . albuterol (PROVENTIL HFA;VENTOLIN HFA) 108 (90 BASE) MCG/ACT inhaler Inhale 2 puffs into the lungs every 6 (six) hours as needed for wheezing or shortness of breath. 1 Inhaler 0  . ALPRAZolam (XANAX) 0.5 MG tablet Take 1 tablet (0.5 mg total) by mouth 3 (three) times daily. LF on 06-25-20 # 90 DS 30 15 tablet 0  . aspirin 81 MG  chewable tablet Chew 1 tablet (81 mg total) by mouth daily. (Patient not taking: Reported on 07/01/2020) 30 tablet 0  . atorvastatin (LIPITOR) 40 MG tablet Take 1 tablet (40 mg total) by mouth daily.    . cyclobenzaprine (FLEXERIL) 5 MG tablet Take 5 mg by mouth 3 (three) times daily as needed.    . folic acid (FOLVITE) 1 MG tablet Take 1 tablet (1 mg total) by mouth daily.    . metoprolol succinate (TOPROL-XL) 25 MG 24 hr tablet Take 25 mg by mouth daily.    . Multiple Vitamins-Minerals (MULTIVITAMIN WITH MINERALS) tablet Take 1 tablet by mouth daily.    Marland Kitchen omeprazole (PRILOSEC) 20 MG capsule Take 20 mg by mouth daily.    Marland Kitchen oxyCODONE-acetaminophen (PERCOCET) 10-325 MG tablet Take 1 tablet by mouth every 8 (eight) hours as needed for pain. 10 tablet 0  . polyethylene glycol (MIRALAX / GLYCOLAX) 17 g packet Take 17 g by mouth daily.    . prochlorperazine (COMPAZINE) 10 MG tablet Take 1 tablet (10 mg total) by mouth every 6 (six) hours as needed for nausea or vomiting. 30 tablet 0  . thiamine 100 MG tablet Take 1 tablet (100 mg total) by mouth daily.     No current facility-administered medications for this encounter.     REVIEW OF SYSTEMS: On review of systems, the patient reports that he is doing well overall. He continues at [employer] with the PT/OT staff and speech is still an issue as well as movement but he feels like he's doing better. He denies any chest pain,  fevers, chills, night sweats. He has had productive cough, no additional hemoptysis, or hematuria is reported. He denies any bowel or bladder disturbances, and denies abdominal pain, nausea or vomiting. He denies any new musculoskeletal or joint aches or pains. A complete review of systems is obtained and is otherwise negative.     PHYSICAL EXAM:  Wt Readings from Last 3 Encounters:  07/21/20 145 lb 11.2 oz (66.1 kg)  07/05/20 160 lb (72.6 kg)  12/14/13 170 lb (77.1 kg)   Temp Readings from Last 3 Encounters:  07/21/20 (!)  100.7 F (38.2 C) (Tympanic)  07/15/20 97.9 F (36.6 C) (Oral)  09/12/19 98.6 F (37 C) (Oral)   BP Readings from Last 3 Encounters:  07/21/20 127/78  07/15/20 (!) 100/58  09/13/19 (!) 142/80   Pulse Readings from Last 3 Encounters:  07/21/20 (!) 110  07/15/20 81  09/13/19 72    /10  In general this is a thin, frail appearing caucasian male in no acute distress. He's alert and oriented x4 and appropriate throughout the examination. Cardiopulmonary assessment is negative for acute distress and he exhibits normal effort.     ECOG = 1  0 - Asymptomatic (Fully active, able to carry on all predisease activities without restriction)  1 - Symptomatic but completely ambulatory (Restricted in physically strenuous activity but  ambulatory and able to carry out work of a light or sedentary nature. For example, light housework, office work)  2 - Symptomatic, <50% in bed during the day (Ambulatory and capable of all self care but unable to carry out any work activities. Up and about more than 50% of waking hours)  3 - Symptomatic, >50% in bed, but not bedbound (Capable of only limited self-care, confined to bed or chair 50% or more of waking hours)  4 - Bedbound (Completely disabled. Cannot carry on any self-care. Totally confined to bed or chair)  5 - Death   Eustace Pen MM, Creech RH, Tormey DC, et al. [phone]). "Toxicity and response criteria of the [facility]". [place] Oncol. 5 (6): 649-55    LABORATORY DATA:  Lab Results  Component Value Date   WBC 11.6 (H) 07/21/2020   HGB 13.6 07/21/2020   HCT 41.9 07/21/2020   MCV 93.7 07/21/2020   PLT 331 07/21/2020   Lab Results  Component Value Date   NA 141 07/21/2020   K 4.2 07/21/2020   CL 102 07/21/2020   CO2 27 07/21/2020   Lab Results  Component Value Date   ALT 37 07/21/2020   AST 35 07/21/2020   ALKPHOS 166 (H) 07/21/2020   BILITOT 0.4 07/21/2020      RADIOGRAPHY: DG Chest 1 View  Result  Date: 07/04/2020 CLINICAL DATA:  Shortness of breath. EXAM: CHEST  1 VIEW COMPARISON:  September 12, 2019 FINDINGS: Platelike opacity extending from the right hilum peripherally, similar since September 12, 2019 but new compared to 2015. Mild fullness in the right hilar region. Left lung is clear. No pneumothorax. No other acute abnormalities. IMPRESSION: Platelike opacity extending from the right hilum peripherally with some fullness in the right hilar region. Recommend CT imaging for better evaluation. These results will be called to the ordering clinician or representative by the Radiologist Assistant, and communication documented in the PACS or [employer]. Electronically Signed   By: Dorise Bullion III M.D   On: 07/04/2020 15:03   CT CHEST WO CONTRAST  Result Date: 07/05/2020 CLINICAL DATA:  Hemoptysis. EXAM: CT CHEST WITHOUT CONTRAST TECHNIQUE: Multidetector CT imaging of the chest was performed following the standard protocol without IV contrast. COMPARISON:  Plain film of the chest 07/05/2023. CT chest 12/13/2013. FINDINGS: Cardiovascular: No significant vascular findings. Normal heart size. No pericardial effusion. Calcific aortic and coronary atherosclerosis noted. Mediastinum/Nodes: A right hilar mass abutting the superior aspect of the right mainstem bronchus and trachea measures 3.9 cm AP x 4.3 cm transverse on image 67 of series 3 x 5.2 cm craniocaudal on coronal image 45, series 5. Amorphous calcifications are present within the lesion. The thyroid gland and esophagus appear normal. Lungs/Pleura: Reticulonodular opacities are seen in the right upper lobe. A nodule along the major fissure in the right upper lobe measures 1.4 cm transverse by 0.6 cm AP on image 39 of series 4. An irregularly-shaped right upper lobe nodule measures 2.3 x 2.5 cm on image 42 of series 4. There is also some discoid atelectasis in the anterior right upper lobe which is likely postobstructive. Mild dependent atelectasis is seen  in the left lung base. There is also some peribronchial thickening in the lower lobes bilaterally. Upper Abdomen: Negative. Musculoskeletal: No lytic or sclerotic lesion. Convex right scoliosis and multilevel degenerative disease noted. IMPRESSION: Right hilar mass consistent with carcinoma. 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 Reticulonodular opacities in the  right upper lobe, a subpleural nodule along the major fissure and nodule in the upper lobe are most consistent with tumor spread. Aortic Atherosclerosis (ICD10-I70.0). Calcific coronary artery disease also noted. Electronically Signed   By: Inge Rise M.D.   On: 07/05/2020 12:01   MR ANGIO HEAD WO CONTRAST  Result Date: 07/01/2020 CLINICAL DATA:  Stroke EXAM: MRA HEAD WITHOUT CONTRAST TECHNIQUE: Angiographic images of the Circle of Willis were obtained using MRA technique without intravenous contrast. COMPARISON:  MRI head 06/30/2020 FINDINGS: Internal carotid artery widely patent and normal bilaterally. Severe stenosis right M1 segment. There appears to be associated filling defect. This could be atherosclerotic disease and or thrombus. This extends to the right MCA bifurcation. Right M2 branches patent with decreased caliber compared to the left. Fenestrated left M1 segment. Left MCA widely patent. Both anterior cerebral arteries widely patent. Basilar widely patent. PICA patent bilaterally. Both vertebral arteries widely patent. Posterior cerebral arteries widely patent with fetal origin on the right. IMPRESSION: Severe stenosis right M1 segment. Associated filling defect may represent thrombus and or atherosclerotic plaque. Decreased perfusion right MCA branches which remains patent. No other significant cranial stenosis Code stroke imaging results were communicated on 07/01/2020 at 2:11 pm to provider Erlinda Hong via text page Electronically Signed   By: Franchot Gallo M.D.   On: 07/01/2020 14:13   MR  BRAIN WO CONTRAST  Result Date: 06/30/2020 CLINICAL DATA:  Transient ischemic attack. EXAM: MRI HEAD WITHOUT CONTRAST TECHNIQUE: Multiplanar, multiecho pulse sequences of the brain and surrounding structures were obtained without intravenous contrast. COMPARISON:  None. FINDINGS: Brain: Diffusion imaging shows acute infarction within the external capsule and caudate body on the right. No evidence of mass effect or hemorrhage. No other acute insult. Elsewhere, the brainstem is normal. Old small vessel cerebellar infarctions inferior on the left. 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 No sign of mass lesion, hemorrhage, hydrocephalus or extra-axial collection. Vascular: Major vessels at the base of the brain show flow. Skull and upper cervical spine: Negative Sinuses/Orbits: Clear/normal Other: None IMPRESSION: 1. Acute infarction affecting the external capsule and caudate body on the right. No mass effect or hemorrhage. 2. 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 Electronically Signed   By: Nelson Chimes M.D.   On: 06/30/2020 20:30   CT ABDOMEN PELVIS W CONTRAST  Result Date: 07/06/2020 CLINICAL DATA:  Lung cancer.  Novel diagnosis.  Staging examination. EXAM: CT ABDOMEN AND PELVIS WITH CONTRAST TECHNIQUE: Multidetector CT imaging of the abdomen and pelvis was performed using the standard protocol following bolus administration of intravenous contrast. CONTRAST:  170m OMNIPAQUE IOHEXOL 300 MG/ML  SOLN COMPARISON:  06/23/2004 FINDINGS: Lower chest: Minimal left basilar atelectasis. Mild coronary artery calcification. Hepatobiliary: No focal liver abnormality is seen. No gallstones, gallbladder wall thickening, or biliary dilatation. Pancreas: Unremarkable Spleen: Unremarkable Adrenals/Urinary Tract: The adrenal glands are unremarkable. The kidneys are normal in size and position. Scattered areas of renal cortical atrophy have developed bilaterally,, more prevalent within the lower  poles bilaterally. No enhancing intrarenal masses. No intrarenal or ureteral calcifications. No hydronephrosis. The bladder is unremarkable. Stomach/Bowel: Moderate sigmoid diverticulosis. The stomach, small bowel, and large bowel are otherwise unremarkable. Appendix normal. No free intraperitoneal gas or fluid. Vascular/Lymphatic: Extensive aortoiliac atherosclerotic calcification. 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 No pathologic adenopathy within the abdomen and pelvis. Reproductive: Prostate is unremarkable. Other: Tiny fat containing umbilical hernia.  Rectum unremarkable. Musculoskeletal: No lytic or blastic bone lesions are identified. Degenerative changes are noted throughout the visualized thoracolumbar spine. No acute bone abnormality. IMPRESSION: No evidence of metastatic disease within the abdomen and pelvis. Interval development of 3.2 cm infrarenal abdominal aortic aneurysm Recommend follow-up ultrasound every 3 years. This recommendation follows ACR consensus guidelines: White Paper of the ACR Incidental Findings Committee II on Vascular Findings. J Am Coll Radiol 2013; 10:789-794. Moderate sigmoid diverticulosis. Aortic aneurysm NOS (ICD10-I71.9). Aortic Atherosclerosis (ICD10-I70.0). Electronically Signed   By: Fidela Salisbury MD   On: 07/06/2020 00:43   NM PET Image Initial (PI) Skull Base To Thigh  Result Date: 07/27/2020 CLINICAL DATA:  Initial treatment strategy for 70 year old male with history of non-small cell lung cancer, squamous cell carcinoma also with history of prostate cancer. History of bronchoscopy with biopsy and cryotherapy on 07/09/2020. EXAM: NUCLEAR MEDICINE PET SKULL BASE TO THIGH TECHNIQUE: 7.26 mCi F-18 FDG was injected intravenously. Full-ring PET imaging was performed from the skull base to thigh after the radiotracer. CT data was obtained and  used for attenuation correction and anatomic localization. Fasting blood glucose: 116 mg/dl COMPARISON:  Chest abdomen pelvis CT 07/05/2020. FINDINGS: Mediastinal blood pool activity: SUV max 1.85 Liver activity: SUV max NA NECK: No hypermetabolic lymph nodes in the neck. Incidental CT findings: Signs of prior RIGHT MCA infarct incompletely evaluated. CHEST: RIGHT hilar mass as on the recent CT of the chest with associated volume loss in the RIGHT middle lobe and RIGHT upper lobe. Mass at the level of the hilum measuring approximately 4.3 x 1.5 cm showing intense FDG uptake. Size not substantially changed compared to recent CT evaluation. Maximum SUV on the current exam of 14.2. RIGHT paratracheal lymph node measuring 0.9 cm on image 68 of series 4 with intense FDG uptake. Maximum SUV 13.8. (Image 70/3) Nodularity and septal thickening at the RIGHT lung apex is similar to the prior exam. This displays mildly increased metabolic activity with a maximum SUV of 2.8 (image 68/3) no effusion. Nodular areas along the pleura, largest in the RIGHT upper lobe is contiguous with bandlike changes but measures 1 x 0.6 cm also showing low level FDG uptake as does ground-glass surrounding bandlike changes that extend into the RIGHT upper lobe with similar uptake to the dominant area measuring 3.3 x 1.3 cm on image 68 of series 4. FDG uptake is outlined above a 2.88. Incidental CT findings: Calcified atheromatous plaque in the thoracic aorta without aneurysmal dilation. Normal caliber central pulmonary vasculature. Signs of coronary artery disease as before. Normal heart size without substantial pericardial effusion. ABDOMEN/PELVIS: No signs of abnormal uptake in solid organs or nodal pathways in the abdomen or pelvis to indicate metastatic disease. Incidental CT findings: No acute findings relative to liver, gallbladder, pancreas, spleen, adrenal glands and kidneys. Urinary bladder is decompressed. No acute findings relative to  bowel.  Colonic diverticulosis. 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 Mild stranding about the vas deferens in the RIGHT hemipelvis with expansion of the vas deferens when compared to the previous CT of the abdomen and pelvis from April of 2022. There is intense metabolic activity adjacent to the vast difference favored to represent the RIGHT ureter. However, slightly away from this there is mildly increased metabolic activity in this location. No significant FDG uptake in the area of the prostate. SKELETON: No focal hypermetabolic activity to suggest skeletal metastasis. Incidental CT findings: Spinal degenerative changes. IMPRESSION:  Signs of large RIGHT hilar mass with intense metabolic activity and associated RIGHT paratracheal adenopathy. Irregular RIGHT hilar mass also with areas of septal thickening and nodularity in the RIGHT upper lobe raising the question of lymphangitic carcinomatosis. FDG uptake in this area is lower level. Morphologic features remain suspicious. An overlay of postobstructive pneumonitis is also considered. Above mass extends into mediastinal fat from the RIGHT hilum and continues to show volume loss in the adjacent RIGHT upper lobe. No additional signs of metastatic disease. Stranding around the RIGHT vas deferens (image 181/4 with low level metabolic activity difficult to separate from adjacent ureter in terms of metabolic activity. Findings represent a change from the recent study inter favored to represent an inflammatory process. Correlate with any pelvic symptoms and consider interval follow-up contrasted imaging given the patient's history of prostate neoplasm. 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 Recommend follow-up every 3 years. This recommendation follows ACR consensus guidelines: White Paper of the ACR Incidental Findings Committee II on Vascular Findings. J Am Coll Radiol  2013; 10:789-794. Aortic Atherosclerosis (ICD10-I70.0). Electronically Signed   By: Zetta Bills M.D.   On: 07/27/2020 11:53   ECHOCARDIOGRAM COMPLETE  Result Date: 07/01/2020    ECHOCARDIOGRAM REPORT   Patient Name:   Cody Douglas Date of Exam: 07/01/2020 Medical Rec #:  [id number]      Height:       71.0 in Accession #:    [id number]     Weight:       170.0 lb Date of Birth:  [birth year]      BSA:          1.968 m Patient Age:    41 years       BP:           125/79 mmHg Patient Gender: M              HR:           77 bpm. Exam Location:  Inpatient Procedure: 2D Echo, Cardiac Doppler and Color Doppler Indications:    CVA  History:        Patient has no prior history of Echocardiogram examinations.                 Risk Factors:Hypertension.  Sonographer:    Luisa Hart RDCS Referring Phys: [id number] [place] Comments: No cardiac surgery or procedures noted in chart IMPRESSIONS  1. Left ventricular ejection fraction, by estimation, is 55 to 60%. The left ventricle has normal function. The left ventricle has no regional wall motion abnormalities. Left ventricular diastolic parameters are consistent with Grade I diastolic dysfunction (impaired relaxation).  2. Right ventricular systolic function is normal. The right ventricular size is normal. There is normal pulmonary artery systolic pressure.  3. Severe calcified bulky lesions on posterior and anterior leaflets Despite calcification edges/borders seem irregular and favor vegetations rather than MAC Suggest TEE to further evaluate. The mitral valve is abnormal. Mild mitral valve regurgitation.  No evidence of mitral stenosis.  4. Severe bulky calcification/ mass particularly on the Non coronary cusp and commisure between the right and non cusps. Again despite calcification edges irregular and suggest vegetations TEE and cardiology consult suggested . The aortic valve is abnormal. Aortic valve regurgitation is mild. No aortic stenosis is  present.  5. The inferior vena cava is normal in size with greater than 50% respiratory variability, suggesting right atrial pressure of 3 mmHg. FINDINGS  Left Ventricle: Left ventricular ejection fraction,  by estimation, is 55 to 60%. The left ventricle has normal function. The left ventricle has no regional wall motion abnormalities. The left ventricular internal cavity size was normal in size. There is  no left ventricular hypertrophy. Left ventricular diastolic parameters are consistent with Grade I diastolic dysfunction (impaired relaxation). Right Ventricle: The right ventricular size is normal. No increase in right ventricular wall thickness. Right ventricular systolic function is normal. There is normal pulmonary artery systolic pressure. The tricuspid regurgitant velocity is 2.56 m/s, and  with an assumed right atrial pressure of 3 mmHg, the estimated right ventricular systolic pressure is 71.0 mmHg. Left Atrium: Left atrial size was normal in size. Right Atrium: Right atrial size was normal in size. Pericardium: There is no evidence of pericardial effusion. Mitral Valve: Severe calcified bulky lesions on posterior and anterior leaflets Despite calcification edges/borders seem irregular and favor vegetations rather than MAC Suggest TEE to further evaluate. The mitral valve is abnormal. Mild mitral valve regurgitation. No evidence of mitral valve stenosis. MV peak gradient, 8.1 mmHg. The mean mitral valve gradient is 4.0 mmHg. Tricuspid Valve: The tricuspid valve is normal in structure. Tricuspid valve regurgitation is not demonstrated. No evidence of tricuspid stenosis. Aortic Valve: Severe bulky calcification/ mass particularly on the Non coronary cusp and commisure between the right and non cusps. Again despite calcification edges irregular and suggest vegetations TEE and cardiology consult suggested. The aortic valve  is abnormal. Aortic valve regurgitation is mild. Aortic regurgitation PHT measures 385  msec. No aortic stenosis is present. Aortic valve mean gradient measures 9.5 mmHg. Aortic valve peak gradient measures 15.9 mmHg. Aortic valve area, by VTI measures 5.87 cm. Pulmonic Valve: The pulmonic valve was normal in structure. Pulmonic valve regurgitation is not visualized. No evidence of pulmonic stenosis. Aorta: The aortic root is normal in size and structure. Venous: The inferior vena cava is normal in size with greater than 50% respiratory variability, suggesting right atrial pressure of 3 mmHg. IAS/Shunts: No atrial level shunt detected by color flow Doppler.  LEFT VENTRICLE PLAX 2D LVIDd:         5.20 cm     Diastology LVIDs:         4.00 cm     LV e' medial:    4.35 cm/s LV PW:         0.70 cm     LV E/e' medial:  21.0 LV IVS:        1.00 cm     LV e' lateral:   7.62 cm/s LVOT diam:     2.70 cm     LV E/e' lateral: 12.0 LV SV:         226 LV SV Index:   115 LVOT Area:     5.73 cm  LV Volumes (MOD) LV vol d, MOD A2C: 79.2 ml LV vol d, MOD A4C: 56.4 ml LV vol s, MOD A2C: 29.7 ml LV vol s, MOD A4C: 21.6 ml LV SV MOD A2C:     49.5 ml LV SV MOD A4C:     56.4 ml LV SV MOD BP:      48.8 ml RIGHT VENTRICLE RV S prime:     12.10 cm/s TAPSE (M-mode): 1.5 cm LEFT ATRIUM             Index       RIGHT ATRIUM           Index LA diam:        3.60 cm 1.83 cm/m  RA Area:     13.80 cm LA Vol (A2C):   31.8 ml 16.16 ml/m RA Volume:   32.60 ml  16.57 ml/m LA Vol (A4C):   46.7 ml 23.73 ml/m LA Biplane Vol: 39.5 ml 20.07 ml/m  AORTIC VALVE                    PULMONIC VALVE AV Area (Vmax):    5.85 cm     PV Vmax:       0.66 m/s AV Area (Vmean):   5.39 cm     PV Peak grad:  1.8 mmHg AV Area (VTI):     5.87 cm AV Vmax:           199.50 cm/s AV Vmean:          143.500 cm/s AV VTI:            0.384 m AV Peak Grad:      15.9 mmHg AV Mean Grad:      9.5 mmHg LVOT Vmax:         204.00 cm/s LVOT Vmean:        135.000 cm/s LVOT VTI:          0.394 m LVOT/AV VTI ratio: 1.03 AI PHT:            385 msec  AORTA Ao Root diam:  4.00 cm MITRAL VALVE                TRICUSPID VALVE MV Area (PHT): 2.09 cm     TR Peak grad:   26.2 mmHg MV Area VTI:   5.32 cm     TR Vmax:        256.00 cm/s MV Peak grad:  8.1 mmHg MV Mean grad:  4.0 mmHg     SHUNTS MV Vmax:       1.42 m/s     Systemic VTI:  0.39 m MV Vmean:      90.8 cm/s    Systemic Diam: 2.70 cm MV Decel Time: 363 msec MR Peak grad: 94.5 mmHg MR Vmax:      486.00 cm/s MV E velocity: 91.30 cm/s MV A velocity: 145.00 cm/s MV E/A ratio:  0.63 Jenkins Rouge MD Electronically signed by Jenkins Rouge MD Signature Date/Time: 07/01/2020/10:07:46 AM    Final    ECHO TEE  Result Date: 07/12/2020    TRANSESOPHOGEAL ECHO REPORT   Patient Name:   Cody Douglas Date of Exam: 07/12/2020 Medical Rec #:  [id number]      Height:       71.0 in Accession #:    [id number]     Weight:       160.0 lb Date of Birth:  11-Jul-1950      BSA:          1.918 m Patient Age:    76 years       BP:           132/70 mmHg Patient Gender: M              HR:           71 bpm. Exam Location:  Inpatient Procedure: Transesophageal Echo, Cardiac Doppler and Color Doppler Indications:     CVA  History:         Patient has prior history of Echocardiogram examinations, most                  recent 07/01/2020. Cardiomyopathy, Stroke, Mitral  Valve Disease;                  Risk Factors:Hypertension.  Sonographer:     Mikki Santee RDCS (AE) Referring Phys:  [id number] Abigail Butts Diagnosing Phys: Kirk Ruths MD PROCEDURE: The transesophogeal probe was passed without difficulty through the esophogus of the patient. Sedation performed by different physician. The patient was monitored while under deep sedation. Anesthestetic sedation was provided intravenously by Anesthesiology: 182.63m of Propofol, 69mof Lidocaine. The patient developed no complications during the procedure. IMPRESSIONS  1. 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  2. Left ventricular ejection fraction, by estimation, is 55 to 60%. The left ventricle has normal function.  3. Right ventricular systolic function is normal. The right ventricular size is normal.  4. No left atrial/left atrial appendage thrombus was detected.  5. The mitral valve is abnormal. Mild mitral valve regurgitation. Moderate mitral annular calcification.  6. The aortic valve is tricuspid. Aortic valve regurgitation is mild to moderate. No aortic stenosis is present.  7. Aortic dilatation noted. There is mild dilatation of the aortic root, measuring 41 mm. There is Moderate (Grade III) plaque involving the descending aorta.  8. Evidence of atrial level shunting detected by color flow Doppler. Agitated saline contrast bubble study was positive with shunting observed after >6 cardiac cycles suggestive of intrapulmonary shunting. FINDINGS  Left Ventricle: Left ventricular ejection fraction, by estimation, is 55 to 60%. The left ventricle has normal function. The left ventricular internal cavity size was normal in size. Right Ventricle: The right ventricular size is normal. Right vetricular wall thickness was not assessed. Right ventricular systolic function is normal. Left Atrium: Left atrial size was normal in size. No left atrial/left atrial appendage thrombus was detected. Right Atrium: Right atrial size was normal in size. Pericardium: There is no evidence of pericardial effusion. Mitral Valve: The mitral valve is abnormal. There is mild calcification of the mitral valve leaflet(s). Moderate mitral annular calcification. Mild mitral valve regurgitation. Tricuspid Valve: The tricuspid valve is normal in structure. Tricuspid valve regurgitation is mild. Aortic Valve: The aortic valve is tricuspid. Aortic valve regurgitation is mild to moderate. Aortic regurgitation PHT measures 965 msec. No aortic stenosis is present. Aortic valve mean gradient measures 5.0 mmHg. Aortic valve peak  gradient measures 9.5 mmHg. Pulmonic Valve: The pulmonic valve was normal in structure. Pulmonic valve regurgitation is trivial. Aorta: Aortic dilatation noted. There is mild dilatation of the aortic root, measuring 41 mm. There is moderate (Grade III) plaque involving the descending aorta. IAS/Shunts: Evidence of atrial level shunting detected by color flow Doppler. Agitated saline contrast bubble study was positive with shunting observed after >6 cardiac cycles suggestive of intrapulmonary shunting. 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  AORTIC VALVE AV Vmax:      154.00 cm/s AV Vmean:     101.000 cm/s AV VTI:       0.303 m AV Peak Grad: 9.5 mmHg AV Mean Grad: 5.0 mmHg AI PHT:       965 msec  AORTA Ao Root diam: 3.90 cm BrKirk RuthsD Electronically signed by BrKirk RuthsD Signature Date/Time: 07/12/2020/2:29:38 PM    Final    [facility]  Result Date: 07/01/2020 Carotid  Arterial Duplex Study Indications:       CVA and Slurred speech & facial droop (left). Risk Factors:      Hypertension, current smoker. Other Factors:     ETOH abuse. Comparison Study:  No previous exams Performing Technologist: Rogelia Rohrer  Examination Guidelines: A complete evaluation includes B-mode imaging, spectral Doppler, color Doppler, and power Doppler as needed of all accessible portions of each vessel. Bilateral testing is considered an integral part of a complete examination. Limited examinations for reoccurring indications may be performed as noted.  Right Carotid Findings: +----------+--------+--------+--------+------------------+------------------+           PSV cm/sEDV cm/sStenosisPlaque DescriptionComments           +----------+--------+--------+--------+------------------+------------------+ CCA Prox  83      12                                 intimal thickening +----------+--------+--------+--------+------------------+------------------+ CCA Distal86      16                                intimal thickening +----------+--------+--------+--------+------------------+------------------+ ICA Prox  53      10                                                   +----------+--------+--------+--------+------------------+------------------+ ICA Distal58      16                                                   +----------+--------+--------+--------+------------------+------------------+ ECA       125     8                                                    +----------+--------+--------+--------+------------------+------------------+ +----------+--------+-------+----------------+-------------------+           PSV cm/sEDV cmsDescribe        Arm Pressure (mmHG) +----------+--------+-------+----------------+-------------------+ [id number]             Multiphasic, WNL                    +----------+--------+-------+----------------+-------------------+ +---------+--------+--+--------+-+---------+ VertebralPSV cm/s35EDV cm/s7Antegrade +---------+--------+--+--------+-+---------+  Left Carotid Findings: +----------+--------+--------+--------+------------------+------------------+           PSV cm/sEDV cm/sStenosisPlaque DescriptionComments           +----------+--------+--------+--------+------------------+------------------+ CCA Prox  84      15                                intimal thickening +----------+--------+--------+--------+------------------+------------------+ CCA Distal80      19                                intimal thickening +----------+--------+--------+--------+------------------+------------------+ ICA Prox  64      20                                                   +----------+--------+--------+--------+------------------+------------------+  ICA Distal76      26                                                    +----------+--------+--------+--------+------------------+------------------+ ECA       179     19                                                   +----------+--------+--------+--------+------------------+------------------+ +----------+--------+--------+----------------+-------------------+           PSV cm/sEDV cm/sDescribe        Arm Pressure (mmHG) +----------+--------+--------+----------------+-------------------+ [id number]              Multiphasic, WNL                    +----------+--------+--------+----------------+-------------------+ +---------+--------+--------+--------------+ VertebralPSV cm/sEDV cm/sNot identified +---------+--------+--------+--------------+   Summary: Right Carotid: The extracranial vessels were near-normal with only minimal wall                thickening or plaque. Left Carotid: The extracranial vessels were near-normal with only minimal wall               thickening or plaque. Vertebrals:  Right vertebral artery demonstrates antegrade flow. Left vertebral              artery was not visualized. Subclavians: Normal flow hemodynamics were seen in bilateral subclavian              arteries. *See table(s) above for measurements and observations.  Electronically signed by Harold Barban MD on 07/01/2020 at 9:27:56 PM.    Final        IMPRESSION/PLAN: 1. Stage IIIA, cT3N1M0, NSCLC, Squamous Cell Carcinoma of the RUL. Dr. Lisbeth Renshaw discusses the pathology findings and reviews the nature of locally advanced lung disease. We reviewed the rationale for chemoRT.  We discussed the risks, benefits, short, and long term effects of radiotherapy, as well as the curative intent, and the patient is interested in proceeding. Dr. Lisbeth Renshaw discusses the delivery and logistics of radiotherapy and anticipates a course of 6 1/2 weeks of radiotherapy. Written consent is obtained and placed in the chart, a copy was provided to the  patient. He will start treatment next week and simulates today. 2.         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 The patient will follow up with neurology in the stroke clinic as an outpt. He should not be at risk of aspiration from esophagitis from treatment but I made sure his family knew we would not recommend viscous lidocane if he has dysphagia due to risk of aspiration secondary to his swallow reflex and lidocaine in the setting of prior stroke.  3. 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 The patient is to follow up with urology and we will follow with their recommendations as well.   In a visit lasting 60 minutes, greater than 50% of the time was spent face to face discussing the patient's condition, in preparation for the discussion, and coordinating the patient's care.   The above documentation reflects my direct findings during this shared patient visit. Please see the separate note by Dr. Lisbeth Renshaw on this date for  the remainder of the patient's plan of care.    Carola Rhine, [facility]   **Disclaimer: This note was dictated with voice recognition software. Similar sounding words can inadvertently be transcribed and this note may contain transcription errors which may not have been corrected upon publication of note.**

## 2020-07-29 ENCOUNTER — Other Ambulatory Visit: Payer: Self-pay

## 2020-07-29 ENCOUNTER — Inpatient Hospital Stay: Payer: Medicare Other

## 2020-07-30 NOTE — Progress Notes (Signed)
Pharmacist Chemotherapy Monitoring - Initial Assessment    Anticipated start date: 08/03/20   Regimen:  . Are orders appropriate based on the patient's diagnosis, regimen, and cycle? Yes . Does the plan date match the patient's scheduled date? Yes . Is the sequencing of drugs appropriate? Yes . Are the premedications appropriate for the patient's regimen? Yes . Prior Authorization for treatment is: Approved o If applicable, is the correct biosimilar selected based on the patient's insurance? not applicable  Organ Function and Labs: Marland Kitchen Are dose adjustments needed based on the patient's renal function, hepatic function, or hematologic function? No . Are appropriate labs ordered prior to the start of patient's treatment? Yes . Other organ system assessment, if indicated: N/A . The following baseline labs, if indicated, have been ordered: N/A  Dose Assessment: . Are the drug doses appropriate? Yes . Are the following correct: o Drug concentrations Yes o IV fluid compatible with drug Yes o Administration routes Yes o Timing of therapy Yes . If applicable, does the patient have documented access for treatment and/or plans for port-a-cath placement? yes . If applicable, have lifetime cumulative doses been properly documented and assessed? yes Lifetime Dose Tracking  No doses have been documented on this patient for the following tracked chemicals: Doxorubicin, Epirubicin, Idarubicin, Daunorubicin, Mitoxantrone, Bleomycin, Oxaliplatin, Carboplatin, Liposomal Doxorubicin  o   Toxicity Monitoring/Prevention: . The patient has the following take home antiemetics prescribed: Prochlorperazine . The patient has the following take home medications prescribed: N/A . Medication allergies and previous infusion related reactions, if applicable, have been reviewed and addressed. Yes . The patient's current medication list has been assessed for drug-drug interactions with their chemotherapy regimen. no  significant drug-drug interactions were identified on review.  Order Review: . Are the treatment plan orders signed? Yes . Is the patient scheduled to see a provider prior to their treatment? Yes  I verify that I have reviewed each item in the above checklist and answered each question accordingly.   Kennith Center, Pharm.D., CPP 07/30/2020@12 :58 PM

## 2020-08-02 ENCOUNTER — Other Ambulatory Visit: Payer: Self-pay | Admitting: Medical Oncology

## 2020-08-02 DIAGNOSIS — Z510 Encounter for antineoplastic radiation therapy: Secondary | ICD-10-CM | POA: Diagnosis not present

## 2020-08-02 DIAGNOSIS — C3411 Malignant neoplasm of upper lobe, right bronchus or lung: Secondary | ICD-10-CM

## 2020-08-02 NOTE — Progress Notes (Signed)
[facility] OFFICE PROGRESS NOTE  Tamsen Roers, MD [address] [place] [address]  DIAGNOSIS: Stage IIIB (T2b, N2, M0) non-small cell lung cancer, squamous cell carcinoma.  He presented with a right upper lobe lung nodule in addition to right hilar and right paratracheal lymphadenopathy. There is irregular right hilar mass also with areas of septal thickening and nodularity in the right upper lobe raising the question of lymphangitic carcinomatosis. He was diagnosed in April 2022.  PRIOR THERAPY: None  CURRENT THERAPY: Weekly concurrent chemoradiation with carboplatin for AUC of 2 and paclitaxel 45 mg per metered squared.  First dose expected on 08/03/2020.  INTERVAL HISTORY: SCHON ZEIDERS 70 y.o. male returns to the clinic today for a follow-up visit.  The patient was recently diagnosed with lung cancer.  He is scheduled to start his first dose of treatment with concurrent chemoradiation today.  He is going to start radiation treatment today as well and his last treatment is expected on 09/20/2020.  Overall, the patient is doing fine today except for decreased appetite. He had a stroke recently. He has some memory deficits since his stroke. He passed all of his swallow studies but sometimes gets strangulated on water. He is currently in a rehab facility. He has not used his albuterol since being in there to his knowledge. He thinks he breathing is "ok" but has a baseline cough which produces mucus.   He denies any fever, chills, or night sweats.  He denies any chest pain, shortness of breath, or hemoptysis. He denies any nausea, vomiting, diarrhea, or constipation.  He denies any headache or visual changes.  The patient recently had a staging PET scan performed.  The patient is here today for evaluation and repeat blood work before starting his first cycle of treatment.  MEDICAL HISTORY: Past Medical History:  Diagnosis Date  . Anxiety   . Dyspnea    with exertion - has inhaler   . Frequent headaches   . GERD (gastroesophageal reflux disease)   . Hypertension   . Prostate cancer ([place])   . Stroke [facility])    Patient reports having a stroke on 4/13    ALLERGIES:  has No Known Allergies.  MEDICATIONS:  Current Outpatient Medications  Medication Sig Dispense Refill  . fluconazole (DIFLUCAN) 100 MG tablet Take 1 tablet (100 mg total) by mouth daily. 10 tablet 0  . albuterol (PROVENTIL HFA;VENTOLIN HFA) 108 (90 BASE) MCG/ACT inhaler Inhale 2 puffs into the lungs every 6 (six) hours as needed for wheezing or shortness of breath. (Patient not taking: Reported on 07/28/2020) 1 Inhaler 0  . ALPRAZolam (XANAX) 0.5 MG tablet Take 1 tablet (0.5 mg total) by mouth 3 (three) times daily. LF on 06-25-20 # 90 DS 30 15 tablet 0  . aspirin 81 MG chewable tablet Chew 1 tablet (81 mg total) by mouth daily. 30 tablet 0  . atorvastatin (LIPITOR) 40 MG tablet Take 1 tablet (40 mg total) by mouth daily.    . cyclobenzaprine (FLEXERIL) 5 MG tablet Take 5 mg by mouth 3 (three) times daily as needed.    . folic acid (FOLVITE) 1 MG tablet Take 1 tablet (1 mg total) by mouth daily.    . metoprolol succinate (TOPROL-XL) 25 MG 24 hr tablet Take 25 mg by mouth daily.    . Multiple Vitamins-Minerals (MULTIVITAMIN WITH MINERALS) tablet Take 1 tablet by mouth daily.    Marland Kitchen omeprazole (PRILOSEC) 20 MG capsule Take 20 mg by mouth  daily.    . oxyCODONE-acetaminophen (PERCOCET) 10-325 MG tablet Take 1 tablet by mouth every 8 (eight) hours as needed for pain. 10 tablet 0  . polyethylene glycol (MIRALAX / GLYCOLAX) 17 g packet Take 17 g by mouth daily.    . prochlorperazine (COMPAZINE) 10 MG tablet Take 1 tablet (10 mg total) by mouth every 6 (six) hours as needed for nausea or vomiting. 30 tablet 0  . thiamine 100 MG tablet Take 1 tablet (100 mg total) by mouth daily. (Patient not taking: Reported on 07/28/2020)     No current facility-administered medications for this visit.    SURGICAL HISTORY:  Past  Surgical History:  Procedure Laterality Date  . BRONCHIAL BRUSHINGS  07/09/2020   Procedure: BRONCHIAL BRUSHINGS;  Surgeon: Garner Nash, DO;  Location: [facility];  Service: Pulmonary;;  . BRONCHIAL WASHINGS  07/09/2020   Procedure: BRONCHIAL WASHINGS;  Surgeon: Garner Nash, DO;  Location: [facility];  Service: Pulmonary;;  . CRYOTHERAPY  07/09/2020   Procedure: CRYOTHERAPY;  Surgeon: Garner Nash, DO;  Location: [facility];  Service: Pulmonary;;  . EYE SURGERY    . HEMOSTASIS CONTROL  07/09/2020   Procedure: HEMOSTASIS CONTROL;  Surgeon: Garner Nash, DO;  Location: [facility];  Service: Pulmonary;;  . PROSTATE BIOPSY     Prostate surgery 8-9 yrs ago  . TEE WITHOUT CARDIOVERSION N/A 07/12/2020   Procedure: TRANSESOPHAGEAL ECHOCARDIOGRAM (TEE);  Surgeon: Lelon Perla, MD;  Location: [facility];  Service: Cardiovascular;  Laterality: N/A;  . VIDEO BRONCHOSCOPY WITH ENDOBRONCHIAL ULTRASOUND Bilateral 07/09/2020   Procedure: VIDEO BRONCHOSCOPY WITH ENDOBRONCHIAL ULTRASOUND;  Surgeon: Garner Nash, DO;  Location: [facility];  Service: Pulmonary;  Laterality: Bilateral;    REVIEW OF SYSTEMS:   Review of Systems  Constitutional: Positive for decreased appetite and fatigue. Negative for chills, fever and unexpected weight change.  HENT:  Negative for mouth sores, nosebleeds, sore throat and trouble swallowing.   Eyes: Negative for eye problems and icterus.  Respiratory: Positive for cough. Negative for  hemoptysis, shortness of breath and wheezing.   Cardiovascular: Negative for chest pain and leg swelling.  Gastrointestinal: Negative for abdominal pain, constipation, diarrhea, nausea and vomiting.  Genitourinary: Negative for bladder incontinence, difficulty urinating, dysuria, frequency and hematuria.   Musculoskeletal: Negative for back pain, gait problem, neck pain and neck stiffness.  Skin: Negative for itching and rash.  Neurological: Negative for  dizziness, extremity weakness, gait problem, headaches, light-headedness and seizures.  Hematological: Negative for adenopathy. Does not bruise/bleed easily.  Psychiatric/Behavioral: Positive for short term memory deficits. Negative for confusion, depression and sleep disturbance. The patient is not nervous/anxious.     PHYSICAL EXAMINATION:  Blood pressure 123/68, pulse 82, temperature 97.8 F (36.6 C), temperature source Tympanic, resp. rate 18, SpO2 99 %.  ECOG PERFORMANCE STATUS: 2-3  Physical Exam  Constitutional: Oriented to person, place, and time and thin appearing male No distress.  HENT:  Head: Normocephalic and atraumatic.  Mouth/Throat: Oropharynx is clear and moist. Oral thrush noted.  Eyes: Conjunctivae are normal. Right eye exhibits no discharge. Left eye exhibits no discharge. No scleral icterus.  Neck: Normal range of motion. Neck supple.  Cardiovascular: Normal rate, regular rhythm, normal heart sounds and intact distal pulses.   Pulmonary/Chest: Effort normal. Rhonchi noted bilaterally. No respiratory distress. No wheezes. No rales.  Abdominal: Soft. Bowel sounds are normal. Exhibits no distension and no mass. There is no tenderness.  Musculoskeletal: Normal range of motion. Exhibits no edema.  Lymphadenopathy:  No cervical adenopathy.  Neurological:  Exhibits muscle weakness. Examined in the wheelchair.  Skin: Skin is warm and dry. No rash noted. Not diaphoretic. No erythema. No pallor.  Psychiatric: Mood, memory and judgment normal.  Vitals reviewed.  LABORATORY DATA: Lab Results  Component Value Date   WBC 8.4 08/03/2020   HGB 12.0 (L) 08/03/2020   HCT 38.2 (L) 08/03/2020   MCV 93.6 08/03/2020   PLT 438 (H) 08/03/2020      Chemistry      Component Value Date/Time   NA 143 08/03/2020 0752   K 3.9 08/03/2020 0752   CL 107 08/03/2020 0752   CO2 25 08/03/2020 0752   BUN 13 08/03/2020 0752   CREATININE 0.59 (L) 08/03/2020 0752      Component Value  Date/Time   CALCIUM 9.6 08/03/2020 0752   ALKPHOS 132 (H) 08/03/2020 0752   AST 15 08/03/2020 0752   ALT 13 08/03/2020 0752   BILITOT 0.3 08/03/2020 0752       RADIOGRAPHIC STUDIES:  DG Chest 1 View  Result Date: 07/04/2020 CLINICAL DATA:  Shortness of breath. EXAM: CHEST  1 VIEW COMPARISON:  September 12, 2019 FINDINGS: Platelike opacity extending from the right hilum peripherally, similar since September 12, 2019 but new compared to 2015. Mild fullness in the right hilar region. Left lung is clear. No pneumothorax. No other acute abnormalities. IMPRESSION: Platelike opacity extending from the right hilum peripherally with some fullness in the right hilar region. Recommend CT imaging for better evaluation. These results will be called to the ordering clinician or representative by the Radiologist Assistant, and communication documented in the PACS or [employer]. Electronically Signed   By: Dorise Bullion III M.D   On: 07/04/2020 15:03   CT CHEST WO CONTRAST  Result Date: 07/05/2020 CLINICAL DATA:  Hemoptysis. EXAM: CT CHEST WITHOUT CONTRAST TECHNIQUE: Multidetector CT imaging of the chest was performed following the standard protocol without IV contrast. COMPARISON:  Plain film of the chest 07/05/2023. CT chest 12/13/2013. FINDINGS: Cardiovascular: No significant vascular findings. Normal heart size. No pericardial effusion. Calcific aortic and coronary atherosclerosis noted. Mediastinum/Nodes: A right hilar mass abutting the superior aspect of the right mainstem bronchus and trachea measures 3.9 cm AP x 4.3 cm transverse on image 67 of series 3 x 5.2 cm craniocaudal on coronal image 45, series 5. Amorphous calcifications are present within the lesion. The thyroid gland and esophagus appear normal. Lungs/Pleura: Reticulonodular opacities are seen in the right upper lobe. A nodule along the major fissure in the right upper lobe measures 1.4 cm transverse by 0.6 cm AP on image 39 of series 4. An  irregularly-shaped right upper lobe nodule measures 2.3 x 2.5 cm on image 42 of series 4. There is also some discoid atelectasis in the anterior right upper lobe which is likely postobstructive. Mild dependent atelectasis is seen in the left lung base. There is also some peribronchial thickening in the lower lobes bilaterally. Upper Abdomen: Negative. Musculoskeletal: No lytic or sclerotic lesion. Convex right scoliosis and multilevel degenerative disease noted. IMPRESSION: Right hilar mass consistent with carcinoma. 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 Reticulonodular opacities in the right upper lobe, a subpleural nodule along the major fissure and nodule in the upper lobe are most consistent with tumor spread. Aortic Atherosclerosis (ICD10-I70.0). Calcific coronary artery disease also noted. Electronically Signed   By: Inge Rise M.D.   On: 07/05/2020 12:01   CT ABDOMEN PELVIS W CONTRAST  Result  Date: 07/06/2020 CLINICAL DATA:  Lung cancer.  Novel diagnosis.  Staging examination. EXAM: CT ABDOMEN AND PELVIS WITH CONTRAST TECHNIQUE: Multidetector CT imaging of the abdomen and pelvis was performed using the standard protocol following bolus administration of intravenous contrast. CONTRAST:  120mL OMNIPAQUE IOHEXOL 300 MG/ML  SOLN COMPARISON:  06/23/2004 FINDINGS: Lower chest: Minimal left basilar atelectasis. Mild coronary artery calcification. Hepatobiliary: No focal liver abnormality is seen. No gallstones, gallbladder wall thickening, or biliary dilatation. Pancreas: Unremarkable Spleen: Unremarkable Adrenals/Urinary Tract: The adrenal glands are unremarkable. The kidneys are normal in size and position. Scattered areas of renal cortical atrophy have developed bilaterally,, more prevalent within the lower poles bilaterally. No enhancing intrarenal masses. No intrarenal or ureteral calcifications. No hydronephrosis. The bladder is unremarkable.  Stomach/Bowel: Moderate sigmoid diverticulosis. The stomach, small bowel, and large bowel are otherwise unremarkable. Appendix normal. No free intraperitoneal gas or fluid. Vascular/Lymphatic: Extensive aortoiliac atherosclerotic calcification. 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 No pathologic adenopathy within the abdomen and pelvis. Reproductive: Prostate is unremarkable. Other: Tiny fat containing umbilical hernia.  Rectum unremarkable. Musculoskeletal: No lytic or blastic bone lesions are identified. Degenerative changes are noted throughout the visualized thoracolumbar spine. No acute bone abnormality. IMPRESSION: No evidence of metastatic disease within the abdomen and pelvis. Interval development of 3.2 cm infrarenal abdominal aortic aneurysm Recommend follow-up ultrasound every 3 years. This recommendation follows ACR consensus guidelines: White Paper of the ACR Incidental Findings Committee II on Vascular Findings. J Am Coll Radiol 2013; 10:789-794. Moderate sigmoid diverticulosis. Aortic aneurysm NOS (ICD10-I71.9). Aortic Atherosclerosis (ICD10-I70.0). Electronically Signed   By: Fidela Salisbury MD   On: 07/06/2020 00:43   NM PET Image Initial (PI) Skull Base To Thigh  Result Date: 07/27/2020 CLINICAL DATA:  Initial treatment strategy for 70 year old male with history of non-small cell lung cancer, squamous cell carcinoma also with history of prostate cancer. History of bronchoscopy with biopsy and cryotherapy on 07/09/2020. EXAM: NUCLEAR MEDICINE PET SKULL BASE TO THIGH TECHNIQUE: 7.26 mCi F-18 FDG was injected intravenously. Full-ring PET imaging was performed from the skull base to thigh after the radiotracer. CT data was obtained and used for attenuation correction and anatomic localization. Fasting blood glucose: 116 mg/dl COMPARISON:  Chest abdomen pelvis CT 07/05/2020.  FINDINGS: Mediastinal blood pool activity: SUV max 1.85 Liver activity: SUV max NA NECK: No hypermetabolic lymph nodes in the neck. Incidental CT findings: Signs of prior RIGHT MCA infarct incompletely evaluated. CHEST: RIGHT hilar mass as on the recent CT of the chest with associated volume loss in the RIGHT middle lobe and RIGHT upper lobe. Mass at the level of the hilum measuring approximately 4.3 x 1.5 cm showing intense FDG uptake. Size not substantially changed compared to recent CT evaluation. Maximum SUV on the current exam of 14.2. RIGHT paratracheal lymph node measuring 0.9 cm on image 68 of series 4 with intense FDG uptake. Maximum SUV 13.8. (Image 70/3) Nodularity and septal thickening at the RIGHT lung apex is similar to the prior exam. This displays mildly increased metabolic activity with a maximum SUV of 2.8 (image 68/3) no effusion. Nodular areas along the pleura, largest in the RIGHT upper lobe is contiguous with bandlike changes but measures 1 x 0.6 cm also showing low level FDG uptake as does ground-glass surrounding bandlike changes that extend into the RIGHT upper lobe with similar uptake to the dominant area measuring 3.3 x 1.3 cm on  image 68 of series 4. FDG uptake is outlined above a 2.88. Incidental CT findings: Calcified atheromatous plaque in the thoracic aorta without aneurysmal dilation. Normal caliber central pulmonary vasculature. Signs of coronary artery disease as before. Normal heart size without substantial pericardial effusion. ABDOMEN/PELVIS: No signs of abnormal uptake in solid organs or nodal pathways in the abdomen or pelvis to indicate metastatic disease. Incidental CT findings: No acute findings relative to liver, gallbladder, pancreas, spleen, adrenal glands and kidneys. Urinary bladder is decompressed. No acute findings relative to bowel.  Colonic diverticulosis. Aortic atherosclerosis with mild aneurysmal caliber of the infrarenal abdominal aorta approximately 2.9 cm  unchanged from previous imaging. Mild stranding about the vas deferens in the RIGHT hemipelvis with expansion of the vas deferens when compared to the previous CT of the abdomen and pelvis from April of 2022. There is intense metabolic activity adjacent to the vast difference favored to represent the RIGHT ureter. However, slightly away from this there is mildly increased metabolic activity in this location. No significant FDG uptake in the area of the prostate. SKELETON: No focal hypermetabolic activity to suggest skeletal metastasis. Incidental CT findings: Spinal degenerative changes. IMPRESSION: Signs of large RIGHT hilar mass with intense metabolic activity and associated RIGHT paratracheal adenopathy. Irregular RIGHT hilar mass also with areas of septal thickening and nodularity in the RIGHT upper lobe raising the question of lymphangitic carcinomatosis. FDG uptake in this area is lower level. Morphologic features remain suspicious. An overlay of postobstructive pneumonitis is also considered. Above mass extends into mediastinal fat from the RIGHT hilum and continues to show volume loss in the adjacent RIGHT upper lobe. No additional signs of metastatic disease. Stranding around the RIGHT vas deferens (image 181/4 with low level metabolic activity difficult to separate from adjacent ureter in terms of metabolic activity. Findings represent a change from the recent study inter favored to represent an inflammatory process. Correlate with any pelvic symptoms and consider interval follow-up contrasted imaging given the patient's history of prostate neoplasm. 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 Recommend follow-up every 3 years. This recommendation follows ACR consensus guidelines: White Paper of the ACR Incidental Findings Committee II on Vascular Findings. J Am Coll Radiol 2013; 10:789-794. Aortic Atherosclerosis (ICD10-I70.0). Electronically Signed   By: Zetta Bills M.D.   On: 07/27/2020 11:53   ECHO  TEE  Result Date: 07/12/2020    TRANSESOPHOGEAL ECHO REPORT   Patient Name:   KORDEL LEAVY Date of Exam: 07/12/2020 Medical Rec #:  [id number]      Height:       71.0 in Accession #:    [id number]     Weight:       160.0 lb Date of Birth:  May 17, 1950      BSA:          1.918 m Patient Age:    70 years       BP:           132/70 mmHg Patient Gender: M              HR:           71 bpm. Exam Location:  Inpatient Procedure: Transesophageal Echo, Cardiac Doppler and Color Doppler Indications:     CVA  History:         Patient has prior history of Echocardiogram examinations, most                  recent 07/01/2020. Cardiomyopathy, Stroke, Mitral Valve Disease;  Risk Factors:Hypertension.  Sonographer:     Mikki Santee RDCS (AE) Referring Phys:  [id number] Abigail Butts Diagnosing Phys: Kirk Ruths MD PROCEDURE: The transesophogeal probe was passed without difficulty through the esophogus of the patient. Sedation performed by different physician. The patient was monitored while under deep sedation. Anesthestetic sedation was provided intravenously by Anesthesiology: 182.89mg  of Propofol, 60mg  of Lidocaine. The patient developed no complications during the procedure. IMPRESSIONS  1. 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  2. Left ventricular ejection fraction, by estimation, is 55 to 60%. The left ventricle has normal function.  3. Right ventricular systolic function is normal. The right ventricular size is normal.  4. No left atrial/left atrial appendage thrombus was detected.  5. The mitral valve is abnormal. Mild mitral valve regurgitation. Moderate mitral annular calcification.  6. The aortic valve is tricuspid. Aortic valve regurgitation is mild to moderate. No aortic stenosis is present.  7. Aortic dilatation noted. There is mild dilatation of the aortic root, measuring 41  mm. There is Moderate (Grade III) plaque involving the descending aorta.  8. Evidence of atrial level shunting detected by color flow Doppler. Agitated saline contrast bubble study was positive with shunting observed after >6 cardiac cycles suggestive of intrapulmonary shunting. FINDINGS  Left Ventricle: Left ventricular ejection fraction, by estimation, is 55 to 60%. The left ventricle has normal function. The left ventricular internal cavity size was normal in size. Right Ventricle: The right ventricular size is normal. Right vetricular wall thickness was not assessed. Right ventricular systolic function is normal. Left Atrium: Left atrial size was normal in size. No left atrial/left atrial appendage thrombus was detected. Right Atrium: Right atrial size was normal in size. Pericardium: There is no evidence of pericardial effusion. Mitral Valve: The mitral valve is abnormal. There is mild calcification of the mitral valve leaflet(s). Moderate mitral annular calcification. Mild mitral valve regurgitation. Tricuspid Valve: The tricuspid valve is normal in structure. Tricuspid valve regurgitation is mild. Aortic Valve: The aortic valve is tricuspid. Aortic valve regurgitation is mild to moderate. Aortic regurgitation PHT measures 965 msec. No aortic stenosis is present. Aortic valve mean gradient measures 5.0 mmHg. Aortic valve peak gradient measures 9.5 mmHg. Pulmonic Valve: The pulmonic valve was normal in structure. Pulmonic valve regurgitation is trivial. Aorta: Aortic dilatation noted. There is mild dilatation of the aortic root, measuring 41 mm. There is moderate (Grade III) plaque involving the descending aorta. IAS/Shunts: Evidence of atrial level shunting detected by color flow Doppler. Agitated saline contrast bubble study was positive with shunting observed after >6 cardiac cycles suggestive of intrapulmonary shunting. 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  AORTIC VALVE AV Vmax:      154.00 cm/s AV Vmean:     101.000 cm/s AV VTI:       0.303 m AV Peak Grad: 9.5 mmHg AV Mean Grad: 5.0 mmHg AI PHT:       965 msec  AORTA Ao Root diam: 3.90 cm Kirk Ruths MD Electronically signed by Kirk Ruths MD Signature Date/Time: 07/12/2020/2:29:38 PM    Final      ASSESSMENT/PLAN:  This is a very pleasant 70 year old Caucasian male diagnosed with stage III (T2B, N2, M0) non-small cell lung cancer, squamous cell carcinoma.  The patient presented with a  right upper lobe lung nodule in addition to right hilar and right paratracheal lymphadenopathy.  There is irregular right hilar mass also with areas of septal thickening and nodularity in the right upper lobe raising the question of lymphangitic carcinomatosis.   Patient was diagnosed in April 2022.   The patient is currently undergoing concurrent chemoradiation with carboplatin for AUC of 2 and paclitaxel 45 mg per metered square.  The patient's first dose of treatment is expected today.  The patient is undergoing radiation under the care of Dr. Lisbeth Renshaw.  The patient had a staging PET scan performed.  Dr. Julien Nordmann personally and independently reviewed the scan and discussed the results with the patient today.   Labs were reviewed. Recommend he proceed with cycle #1 today as scheduled.   We will see him back for a follow up visit in 2 weeks for evaluation before starting cycle #3.   I will place a referral to the nutritionist since he is at risk for weight loss with starting his treatment. His daughter is going to stock a refrigerator in his rehab facility with ensure/boost. He also has thrush, I will send a prescription for 1 tablet to be taken daily for 10 days.   He has some rhonchi on exam. He is not using albuterol. Reviewed he should use this if he feels like he has increased shortness of breath/chest  congestion.   The patient was advised to call immediately if he has any concerning symptoms in the interval. The patient voices understanding of current disease status and treatment options and is in agreement with the current care plan. All questions were answered. The patient knows to call the clinic with any problems, questions or concerns. We can certainly see the patient much sooner if necessary      Orders Placed This Encounter  Procedures  . CBC with Differential (Cancer Center Only)    Standing Status:   Standing    Number of Occurrences:   7    Standing Expiration Date:   08/03/2021  . CMP ([facility] only)    Standing Status:   Standing    Number of Occurrences:   6    Standing Expiration Date:   08/03/2021  . Ambulatory Referral to [facility]    Referral Priority:   Routine    Referral Type:   Consultation    Referral Reason:   Specialty Services Required    Number of Visits Requested:   [place], PA-C 08/03/20  ADDENDUM: Hematology/Oncology Attending: I had a face-to-face encounter with the patient today.  I reviewed his record, scans and recommended his care plan.  This is a very pleasant 70 years old white male recently diagnosed with stage IIIb (T2b, N2, M0) non-small cell lung cancer, squamous cell carcinoma presented with right upper lobe lung nodule in addition to right hilar and right paratracheal lymphadenopathy diagnosed in April 2022. The patient had a PET scan performed recently.  I personally and independently reviewed the PET scan images and discussed the result and showed the images to the patient and his daughter. His PET scan showed no concerning findings for disease progression. I recommended for the patient to proceed with a course of concurrent chemoradiation with weekly carboplatin for AUC of 2 and paclitaxel 45 Mg/M2 for 6-7 weeks and this will be followed by consolidation immunotherapy if he has no evidence for disease  progression after the induction phase taken into consideration any other factors. He  will proceed with the first cycle of his treatment today. We will see him back for follow-up visit in 2 weeks for evaluation and management of any adverse effect of his treatment. The patient was advised to call immediately if he has any concerning symptoms in the interval.  Disclaimer: This note was dictated with voice recognition software. Similar sounding words can inadvertently be transcribed and may be missed upon review. Eilleen Kempf, MD 08/03/20

## 2020-08-03 ENCOUNTER — Ambulatory Visit
Admission: RE | Admit: 2020-08-03 | Discharge: 2020-08-03 | Disposition: A | Discharge status: Home / Self Care | Payer: Medicare Other | Source: Ambulatory Visit | Attending: Radiation Oncology | Admitting: Radiation Oncology

## 2020-08-03 ENCOUNTER — Inpatient Hospital Stay: Payer: Medicare Other

## 2020-08-03 ENCOUNTER — Inpatient Hospital Stay (HOSPITAL_BASED_OUTPATIENT_CLINIC_OR_DEPARTMENT_OTHER): Payer: Medicare Other | Admitting: Physician Assistant

## 2020-08-03 ENCOUNTER — Other Ambulatory Visit: Payer: Self-pay

## 2020-08-03 ENCOUNTER — Telehealth: Payer: Self-pay | Admitting: Internal Medicine

## 2020-08-03 VITALS — BP 138/72 | HR 83 | Temp 98.4°F | Resp 18 | Wt 141.0 lb

## 2020-08-03 VITALS — BP 123/68 | HR 82 | Temp 97.8°F | Resp 18

## 2020-08-03 DIAGNOSIS — C3411 Malignant neoplasm of upper lobe, right bronchus or lung: Secondary | ICD-10-CM

## 2020-08-03 DIAGNOSIS — Z5111 Encounter for antineoplastic chemotherapy: Secondary | ICD-10-CM | POA: Diagnosis not present

## 2020-08-03 DIAGNOSIS — B370 Candidal stomatitis: Secondary | ICD-10-CM | POA: Diagnosis not present

## 2020-08-03 DIAGNOSIS — Z510 Encounter for antineoplastic radiation therapy: Secondary | ICD-10-CM | POA: Diagnosis not present

## 2020-08-03 LAB — CMP (CANCER CENTER ONLY)
ALT: 13 U/L (ref 0–44)
AST: 15 U/L (ref 15–41)
Albumin: 2.9 g/dL — ABNORMAL LOW (ref 3.5–5.0)
Alkaline Phosphatase: 132 U/L — ABNORMAL HIGH (ref 38–126)
Anion gap: 11 (ref 5–15)
BUN: 13 mg/dL (ref 8–23)
CO2: 25 mmol/L (ref 22–32)
Calcium: 9.6 mg/dL (ref 8.9–10.3)
Chloride: 107 mmol/L (ref 98–111)
Creatinine: 0.59 mg/dL — ABNORMAL LOW (ref 0.61–1.24)
GFR, Estimated: >60 mL/min (ref >60)
Glucose, Bld: 110 mg/dL — ABNORMAL HIGH (ref 70–99)
Potassium: 3.9 mmol/L (ref 3.5–5.1)
Sodium: 143 mmol/L (ref 135–145)
Total Bilirubin: 0.3 mg/dL (ref 0.3–1.2)
Total Protein: 6.9 g/dL (ref 6.5–8.1)

## 2020-08-03 LAB — CBC WITH DIFFERENTIAL (CANCER CENTER ONLY)
Abs Immature Granulocytes: 0.03 10*3/uL (ref 0.00–0.07)
Basophils Absolute: 0 10*3/uL (ref 0.0–0.1)
Basophils Relative: 0 %
Eosinophils Absolute: 0.1 10*3/uL (ref 0.0–0.5)
Eosinophils Relative: 1 %
HCT: 38.2 % — ABNORMAL LOW (ref 39.0–52.0)
Hemoglobin: 12 g/dL — ABNORMAL LOW (ref 13.0–17.0)
Immature Granulocytes: 0 %
Lymphocytes Relative: 21 %
Lymphs Abs: 1.8 10*3/uL (ref 0.7–4.0)
MCH: 29.4 pg (ref 26.0–34.0)
MCHC: 31.4 g/dL (ref 30.0–36.0)
MCV: 93.6 fL (ref 80.0–100.0)
Monocytes Absolute: 0.5 10*3/uL (ref 0.1–1.0)
Monocytes Relative: 5 %
Neutro Abs: 6 10*3/uL (ref 1.7–7.7)
Neutrophils Relative %: 73 %
Platelet Count: 438 10*3/uL — ABNORMAL HIGH (ref 150–400)
RBC: 4.08 MIL/uL — ABNORMAL LOW (ref 4.22–5.81)
RDW: 12.8 % (ref 11.5–15.5)
WBC Count: 8.4 10*3/uL (ref 4.0–10.5)
nRBC: 0 % (ref 0.0–0.2)

## 2020-08-03 MED ORDER — ACETAMINOPHEN 325 MG PO TABS
ORAL_TABLET | ORAL | Status: AC
  Filled 2020-08-03: qty 2

## 2020-08-03 MED ORDER — PALONOSETRON HCL INJECTION 0.25 MG/5ML
0.2500 mg | Freq: Once | INTRAVENOUS | Status: AC
Start: 2020-08-03 — End: 2020-08-03
  Administered 2020-08-03: 0.25 mg via INTRAVENOUS

## 2020-08-03 MED ORDER — SODIUM CHLORIDE 0.9 % IV SOLN
Freq: Once | INTRAVENOUS | Status: AC
  Filled 2020-08-03: qty 250

## 2020-08-03 MED ORDER — FAMOTIDINE 20 MG IN NS 100 ML IVPB
INTRAVENOUS | Status: AC
  Filled 2020-08-03: qty 100

## 2020-08-03 MED ORDER — SODIUM CHLORIDE 0.9 % IV SOLN
45.0000 mg/m2 | Freq: Once | INTRAVENOUS | Status: AC
  Administered 2020-08-03: 84 mg via INTRAVENOUS
  Filled 2020-08-03: qty 14

## 2020-08-03 MED ORDER — SODIUM CHLORIDE 0.9 % IV SOLN
180.0000 mg | Freq: Once | INTRAVENOUS | Status: AC
  Administered 2020-08-03: 180 mg via INTRAVENOUS
  Filled 2020-08-03: qty 18

## 2020-08-03 MED ORDER — FLUCONAZOLE 100 MG PO TABS: 100.0000 mg | ORAL_TABLET | Freq: Every day | ORAL | 0 refills | Status: DC

## 2020-08-03 MED ORDER — DIPHENHYDRAMINE HCL 50 MG/ML IJ SOLN
50.0000 mg | Freq: Once | INTRAMUSCULAR | Status: AC
  Administered 2020-08-03: 50 mg via INTRAVENOUS

## 2020-08-03 MED ORDER — DEXAMETHASONE SODIUM PHOSPHATE 100 MG/10ML IJ SOLN
20.0000 mg | Freq: Once | INTRAMUSCULAR | Status: AC
  Administered 2020-08-03: 20 mg via INTRAVENOUS
  Filled 2020-08-03: qty 20

## 2020-08-03 MED ORDER — FAMOTIDINE 20 MG IN NS 100 ML IVPB
20.0000 mg | Freq: Once | INTRAVENOUS | Status: AC
Start: 2020-08-03 — End: 2020-08-03
  Administered 2020-08-03: 20 mg via INTRAVENOUS

## 2020-08-03 MED ORDER — PALONOSETRON HCL INJECTION 0.25 MG/5ML
INTRAVENOUS | Status: AC
  Filled 2020-08-03: qty 5

## 2020-08-03 MED ORDER — DIPHENHYDRAMINE HCL 50 MG/ML IJ SOLN
INTRAMUSCULAR | Status: AC
  Filled 2020-08-03: qty 1

## 2020-08-03 MED ORDER — ACETAMINOPHEN 325 MG PO TABS
650.0000 mg | ORAL_TABLET | Freq: Once | ORAL | Status: AC
  Administered 2020-08-03: 650 mg via ORAL

## 2020-08-03 NOTE — Patient Instructions (Addendum)
[facility]  Discharge Instructions: Thank you for choosing [facility] to provide your oncology and hematology care.   If you have a lab appointment with the [facility], please go directly to the [facility] and check in at the registration area.   Wear comfortable clothing and clothing appropriate for easy access to any Portacath or PICC line.   We strive to give you quality time with your provider. You may need to reschedule your appointment if you arrive late (15 or more minutes).  Arriving late affects you and other patients whose appointments are after yours.  Also, if you miss three or more appointments without notifying the office, you may be dismissed from the clinic at the provider's discretion.      For prescription refill requests, have your pharmacy contact our office and allow 72 hours for refills to be completed.    Today you received the following chemotherapy and/or immunotherapy agents: Taxol/Carboplatin.      To help prevent nausea and vomiting after your treatment, we encourage you to take your nausea medication as directed.  BELOW ARE SYMPTOMS THAT SHOULD BE REPORTED IMMEDIATELY: . *FEVER GREATER THAN 100.4 F (38 C) OR HIGHER . *CHILLS OR SWEATING . *NAUSEA AND VOMITING THAT IS NOT CONTROLLED WITH YOUR NAUSEA MEDICATION . *UNUSUAL SHORTNESS OF BREATH . *UNUSUAL BRUISING OR BLEEDING . *URINARY PROBLEMS (pain or burning when urinating, or frequent urination) . *BOWEL PROBLEMS (unusual diarrhea, constipation, pain near the anus) . TENDERNESS IN MOUTH AND THROAT WITH OR WITHOUT PRESENCE OF ULCERS (sore throat, sores in mouth, or a toothache) . UNUSUAL RASH, SWELLING OR PAIN  . UNUSUAL VAGINAL DISCHARGE OR ITCHING   Items with * indicate a potential emergency and should be followed up as soon as possible or go to the Emergency Department if any problems should occur.  Please show the CHEMOTHERAPY ALERT CARD or  IMMUNOTHERAPY ALERT CARD at check-in to the Emergency Department and triage nurse.  Should you have questions after your visit or need to cancel or reschedule your appointment, please contact [facility]  Dept: [phone]  and follow the prompts.  Office hours are 8:00 a.m. to 4:30 p.m. Monday - Friday. Please note that voicemails left after 4:00 p.m. may not be returned until the following business day.  We are closed weekends and major holidays. You have access to a nurse at all times for urgent questions. Please call the main number to the clinic Dept: [phone] and follow the prompts.   For any non-urgent questions, you may also contact your provider using MyChart. We now offer e-Visits for anyone 24 and older to request care online for non-urgent symptoms. For details visit [email or web address].   Also download the MyChart app! Go to the app store, search "MyChart", open the app, select [facility], and log in with your MyChart username and password.  Due to Covid, a mask is required upon entering the hospital/clinic. If you do not have a mask, one will be given to you upon arrival. For doctor visits, patients may have 1 support person aged 53 or older with them. For treatment visits, patients cannot have anyone with them due to current Covid guidelines and our immunocompromised population.   Paclitaxel injection What is this medicine? PACLITAXEL (PAK li TAX el) is a chemotherapy drug. It targets fast dividing cells, like cancer cells, and causes these cells to die. This medicine is used to treat ovarian cancer, breast cancer,  lung cancer, Kaposi's sarcoma, and other cancers. This medicine may be used for other purposes; ask your health care provider or pharmacist if you have questions. COMMON BRAND NAME(S): Onxol, Taxol What should I tell my health care provider before I take this medicine? They need to know if you have any of these conditions:  history  of irregular heartbeat  liver disease  low blood counts, like low white cell, platelet, or red cell counts  lung or breathing disease, like asthma  tingling of the fingers or toes, or other nerve disorder  an unusual or allergic reaction to paclitaxel, alcohol, polyoxyethylated castor oil, other chemotherapy, other medicines, foods, dyes, or preservatives  pregnant or trying to get pregnant  breast-feeding How should I use this medicine? This drug is given as an infusion into a vein. It is administered in a hospital or clinic by a specially trained health care professional. Talk to your pediatrician regarding the use of this medicine in children. Special care may be needed. Overdosage: If you think you have taken too much of this medicine contact a poison control center or emergency room at once. NOTE: This medicine is only for you. Do not share this medicine with others. What if I miss a dose? It is important not to miss your dose. Call your doctor or health care professional if you are unable to keep an appointment. What may interact with this medicine? Do not take this medicine with any of the following medications:  live virus vaccines This medicine may also interact with the following medications:  antiviral medicines for hepatitis, HIV or AIDS  certain antibiotics like erythromycin and clarithromycin  certain medicines for fungal infections like ketoconazole and itraconazole  certain medicines for seizures like carbamazepine, phenobarbital, phenytoin  gemfibrozil  nefazodone  rifampin  St. John's wort This list may not describe all possible interactions. Give your health care provider a list of all the medicines, herbs, non-prescription drugs, or dietary supplements you use. Also tell them if you smoke, drink alcohol, or use illegal drugs. Some items may interact with your medicine. What should I watch for while using this medicine? Your condition will be monitored  carefully while you are receiving this medicine. You will need important blood work done while you are taking this medicine. This medicine can cause serious allergic reactions. To reduce your risk you will need to take other medicine(s) before treatment with this medicine. If you experience allergic reactions like skin rash, itching or hives, swelling of the face, lips, or tongue, tell your doctor or health care professional right away. In some cases, you may be given additional medicines to help with side effects. Follow all directions for their use. This drug may make you feel generally unwell. This is not uncommon, as chemotherapy can affect healthy cells as well as cancer cells. Report any side effects. Continue your course of treatment even though you feel ill unless your doctor tells you to stop. Call your doctor or health care professional for advice if you get a fever, chills or sore throat, or other symptoms of a cold or flu. Do not treat yourself. This drug decreases your body's ability to fight infections. Try to avoid being around people who are sick. This medicine may increase your risk to bruise or bleed. Call your doctor or health care professional if you notice any unusual bleeding. Be careful brushing and flossing your teeth or using a toothpick because you may get an infection or bleed more easily. If you  have any dental work done, tell your dentist you are receiving this medicine. Avoid taking products that contain aspirin, acetaminophen, ibuprofen, naproxen, or ketoprofen unless instructed by your doctor. These medicines may hide a fever. Do not become pregnant while taking this medicine. Women should inform their doctor if they wish to become pregnant or think they might be pregnant. There is a potential for serious side effects to an unborn child. Talk to your health care professional or pharmacist for more information. Do not breast-feed an infant while taking this medicine. Men are  advised not to father a child while receiving this medicine. This product may contain alcohol. Ask your pharmacist or healthcare provider if this medicine contains alcohol. Be sure to tell all healthcare providers you are taking this medicine. Certain medicines, like metronidazole and disulfiram, can cause an unpleasant reaction when taken with alcohol. The reaction includes flushing, headache, nausea, vomiting, sweating, and increased thirst. The reaction can last from 30 minutes to several hours. What side effects may I notice from receiving this medicine? Side effects that you should report to your doctor or health care professional as soon as possible:  allergic reactions like skin rash, itching or hives, swelling of the face, lips, or tongue  breathing problems  changes in vision  fast, irregular heartbeat  high or low blood pressure  mouth sores  pain, tingling, numbness in the hands or feet  signs of decreased platelets or bleeding - bruising, pinpoint red spots on the skin, black, tarry stools, blood in the urine  signs of decreased red blood cells - unusually weak or tired, feeling faint or lightheaded, falls  signs of infection - fever or chills, cough, sore throat, pain or difficulty passing urine  signs and symptoms of liver injury like dark yellow or brown urine; general ill feeling or flu-like symptoms; light-colored stools; loss of appetite; nausea; right upper belly pain; unusually weak or tired; yellowing of the eyes or skin  swelling of the ankles, feet, hands  unusually slow heartbeat Side effects that usually do not require medical attention (report to your doctor or health care professional if they continue or are bothersome):  diarrhea  hair loss  loss of appetite  muscle or joint pain  nausea, vomiting  pain, redness, or irritation at site where injected  tiredness This list may not describe all possible side effects. Call your doctor for medical  advice about side effects. You may report side effects to FDA at 1-800-FDA-1088. Where should I keep my medicine? This drug is given in a hospital or clinic and will not be stored at home. NOTE: This sheet is a summary. It may not cover all possible information. If you have questions about this medicine, talk to your doctor, pharmacist, or health care provider.  2021 Elsevier/Gold Standard (2019-01-29 13:37:23)  Carboplatin injection What is this medicine? CARBOPLATIN (KAR boe pla tin) is a chemotherapy drug. It targets fast dividing cells, like cancer cells, and causes these cells to die. This medicine is used to treat ovarian cancer and many other cancers. This medicine may be used for other purposes; ask your health care provider or pharmacist if you have questions. COMMON BRAND NAME(S): Paraplatin What should I tell my health care provider before I take this medicine? They need to know if you have any of these conditions:  blood disorders  hearing problems  kidney disease  recent or ongoing radiation therapy  an unusual or allergic reaction to carboplatin, cisplatin, other chemotherapy, other medicines, foods,  dyes, or preservatives  pregnant or trying to get pregnant  breast-feeding How should I use this medicine? This drug is usually given as an infusion into a vein. It is administered in a hospital or clinic by a specially trained health care professional. Talk to your pediatrician regarding the use of this medicine in children. Special care may be needed. Overdosage: If you think you have taken too much of this medicine contact a poison control center or emergency room at once. NOTE: This medicine is only for you. Do not share this medicine with others. What if I miss a dose? It is important not to miss a dose. Call your doctor or health care professional if you are unable to keep an appointment. What may interact with this medicine?  medicines for seizures  medicines  to increase blood counts like filgrastim, pegfilgrastim, sargramostim  some antibiotics like amikacin, gentamicin, neomycin, streptomycin, tobramycin  vaccines Talk to your doctor or health care professional before taking any of these medicines:  acetaminophen  aspirin  ibuprofen  ketoprofen  naproxen This list may not describe all possible interactions. Give your health care provider a list of all the medicines, herbs, non-prescription drugs, or dietary supplements you use. Also tell them if you smoke, drink alcohol, or use illegal drugs. Some items may interact with your medicine. What should I watch for while using this medicine? Your condition will be monitored carefully while you are receiving this medicine. You will need important blood work done while you are taking this medicine. This drug may make you feel generally unwell. This is not uncommon, as chemotherapy can affect healthy cells as well as cancer cells. Report any side effects. Continue your course of treatment even though you feel ill unless your doctor tells you to stop. In some cases, you may be given additional medicines to help with side effects. Follow all directions for their use. Call your doctor or health care professional for advice if you get a fever, chills or sore throat, or other symptoms of a cold or flu. Do not treat yourself. This drug decreases your body's ability to fight infections. Try to avoid being around people who are sick. This medicine may increase your risk to bruise or bleed. Call your doctor or health care professional if you notice any unusual bleeding. Be careful brushing and flossing your teeth or using a toothpick because you may get an infection or bleed more easily. If you have any dental work done, tell your dentist you are receiving this medicine. Avoid taking products that contain aspirin, acetaminophen, ibuprofen, naproxen, or ketoprofen unless instructed by your doctor. These medicines  may hide a fever. Do not become pregnant while taking this medicine. Women should inform their doctor if they wish to become pregnant or think they might be pregnant. There is a potential for serious side effects to an unborn child. Talk to your health care professional or pharmacist for more information. Do not breast-feed an infant while taking this medicine. What side effects may I notice from receiving this medicine? Side effects that you should report to your doctor or health care professional as soon as possible:  allergic reactions like skin rash, itching or hives, swelling of the face, lips, or tongue  signs of infection - fever or chills, cough, sore throat, pain or difficulty passing urine  signs of decreased platelets or bleeding - bruising, pinpoint red spots on the skin, black, tarry stools, nosebleeds  signs of decreased red blood cells - unusually  weak or tired, fainting spells, lightheadedness  breathing problems  changes in hearing  changes in vision  chest pain  high blood pressure  low blood counts - This drug may decrease the number of white blood cells, red blood cells and platelets. You may be at increased risk for infections and bleeding.  nausea and vomiting  pain, swelling, redness or irritation at the injection site  pain, tingling, numbness in the hands or feet  problems with balance, talking, walking  trouble passing urine or change in the amount of urine Side effects that usually do not require medical attention (report to your doctor or health care professional if they continue or are bothersome):  hair loss  loss of appetite  metallic taste in the mouth or changes in taste This list may not describe all possible side effects. Call your doctor for medical advice about side effects. You may report side effects to FDA at 1-800-FDA-1088. Where should I keep my medicine? This drug is given in a hospital or clinic and will not be stored at  home. NOTE: This sheet is a summary. It may not cover all possible information. If you have questions about this medicine, talk to your doctor, pharmacist, or health care provider.  2021 Elsevier/Gold Standard (2007-06-04 14:38:05)

## 2020-08-03 NOTE — Progress Notes (Signed)
RN arranged transportation with Suanne Marker from [facility] facility.  Pickup time scheduled for 1515, after radiation.

## 2020-08-03 NOTE — Telephone Encounter (Signed)
Scheduled appt per 5/24 sch msg. Will have updated calendar printed for pt while he's in infusion today

## 2020-08-04 ENCOUNTER — Ambulatory Visit
Admission: RE | Admit: 2020-08-04 | Discharge: 2020-08-04 | Disposition: A | Discharge status: Home / Self Care | Payer: Medicare Other | Source: Ambulatory Visit | Attending: Radiation Oncology | Admitting: Radiation Oncology

## 2020-08-04 DIAGNOSIS — Z510 Encounter for antineoplastic radiation therapy: Secondary | ICD-10-CM | POA: Diagnosis not present

## 2020-08-05 ENCOUNTER — Other Ambulatory Visit: Payer: Self-pay

## 2020-08-05 ENCOUNTER — Ambulatory Visit
Admission: RE | Admit: 2020-08-05 | Discharge: 2020-08-05 | Disposition: A | Discharge status: Home / Self Care | Payer: Medicare Other | Source: Ambulatory Visit | Attending: Radiation Oncology | Admitting: Radiation Oncology

## 2020-08-05 DIAGNOSIS — Z510 Encounter for antineoplastic radiation therapy: Secondary | ICD-10-CM | POA: Diagnosis not present

## 2020-08-05 NOTE — Progress Notes (Signed)
Pt here for patient teaching.  Pt given Radiation and You booklet, skin care instructions and Sonafine.  Reviewed areas of pertinence such as fatigue, hair loss, skin changes, throat changes, cough and shortness of breath . Pt able to give teach back of to pat skin and use unscented/gentle soap,apply Sonafine bid and avoid applying anything to skin within 4 hours of treatment. Pt verbalizes understanding of information given and will contact nursing with any questions or concerns.     [email or web address]

## 2020-08-06 ENCOUNTER — Ambulatory Visit
Admission: RE | Admit: 2020-08-06 | Discharge: 2020-08-06 | Disposition: A | Discharge status: Home / Self Care | Payer: Medicare Other | Source: Ambulatory Visit | Attending: Radiation Oncology | Admitting: Radiation Oncology

## 2020-08-06 DIAGNOSIS — Z510 Encounter for antineoplastic radiation therapy: Secondary | ICD-10-CM | POA: Diagnosis not present

## 2020-08-06 DIAGNOSIS — C3411 Malignant neoplasm of upper lobe, right bronchus or lung: Secondary | ICD-10-CM

## 2020-08-06 MED ORDER — SONAFINE EX EMUL
1.0000 "application " | Freq: Two times a day (BID) | CUTANEOUS | Status: DC
  Administered 2020-08-06 (×2): 1 via TOPICAL

## 2020-08-10 ENCOUNTER — Other Ambulatory Visit: Payer: Self-pay | Admitting: Medical Oncology

## 2020-08-10 ENCOUNTER — Inpatient Hospital Stay: Payer: Medicare Other

## 2020-08-10 ENCOUNTER — Ambulatory Visit
Admission: RE | Admit: 2020-08-10 | Discharge: 2020-08-10 | Disposition: A | Discharge status: Home / Self Care | Payer: Medicare Other | Source: Ambulatory Visit | Attending: Radiation Oncology | Admitting: Radiation Oncology

## 2020-08-10 ENCOUNTER — Other Ambulatory Visit: Payer: Self-pay

## 2020-08-10 VITALS — BP 114/64 | HR 91 | Temp 97.8°F | Resp 16

## 2020-08-10 DIAGNOSIS — Z5111 Encounter for antineoplastic chemotherapy: Secondary | ICD-10-CM | POA: Diagnosis not present

## 2020-08-10 DIAGNOSIS — C3411 Malignant neoplasm of upper lobe, right bronchus or lung: Secondary | ICD-10-CM

## 2020-08-10 DIAGNOSIS — Z510 Encounter for antineoplastic radiation therapy: Secondary | ICD-10-CM | POA: Diagnosis not present

## 2020-08-10 LAB — CBC WITH DIFFERENTIAL (CANCER CENTER ONLY)
Abs Immature Granulocytes: 0.07 10*3/uL (ref 0.00–0.07)
Basophils Absolute: 0 10*3/uL (ref 0.0–0.1)
Basophils Relative: 0 %
Eosinophils Absolute: 0.1 10*3/uL (ref 0.0–0.5)
Eosinophils Relative: 2 %
HCT: 34.7 % — ABNORMAL LOW (ref 39.0–52.0)
Hemoglobin: 11 g/dL — ABNORMAL LOW (ref 13.0–17.0)
Immature Granulocytes: 1 %
Lymphocytes Relative: 12 %
Lymphs Abs: 1.1 10*3/uL (ref 0.7–4.0)
MCH: 28.9 pg (ref 26.0–34.0)
MCHC: 31.7 g/dL (ref 30.0–36.0)
MCV: 91.1 fL (ref 80.0–100.0)
Monocytes Absolute: 0.5 10*3/uL (ref 0.1–1.0)
Monocytes Relative: 6 %
Neutro Abs: 7.1 10*3/uL (ref 1.7–7.7)
Neutrophils Relative %: 79 %
Platelet Count: 439 10*3/uL — ABNORMAL HIGH (ref 150–400)
RBC: 3.81 MIL/uL — ABNORMAL LOW (ref 4.22–5.81)
RDW: 13.3 % (ref 11.5–15.5)
WBC Count: 8.9 10*3/uL (ref 4.0–10.5)
nRBC: 0 % (ref 0.0–0.2)

## 2020-08-10 LAB — CMP (CANCER CENTER ONLY)
ALT: 10 U/L (ref 0–44)
AST: 13 U/L — ABNORMAL LOW (ref 15–41)
Albumin: 3 g/dL — ABNORMAL LOW (ref 3.5–5.0)
Alkaline Phosphatase: 109 U/L (ref 38–126)
Anion gap: 13 (ref 5–15)
BUN: 10 mg/dL (ref 8–23)
CO2: 26 mmol/L (ref 22–32)
Calcium: 9.8 mg/dL (ref 8.9–10.3)
Chloride: 105 mmol/L (ref 98–111)
Creatinine: 0.61 mg/dL (ref 0.61–1.24)
GFR, Estimated: >60 mL/min (ref >60)
Glucose, Bld: 102 mg/dL — ABNORMAL HIGH (ref 70–99)
Potassium: 4.4 mmol/L (ref 3.5–5.1)
Sodium: 144 mmol/L (ref 135–145)
Total Bilirubin: 0.3 mg/dL (ref 0.3–1.2)
Total Protein: 7 g/dL (ref 6.5–8.1)

## 2020-08-10 MED ORDER — ACETAMINOPHEN 325 MG PO TABS
650.0000 mg | ORAL_TABLET | Freq: Once | ORAL | Status: AC
  Administered 2020-08-10: 650 mg via ORAL

## 2020-08-10 MED ORDER — DIPHENHYDRAMINE HCL 50 MG/ML IJ SOLN
INTRAMUSCULAR | Status: AC
  Filled 2020-08-10: qty 1

## 2020-08-10 MED ORDER — SODIUM CHLORIDE 0.9 % IV SOLN
45.0000 mg/m2 | Freq: Once | INTRAVENOUS | Status: AC
  Administered 2020-08-10: 84 mg via INTRAVENOUS
  Filled 2020-08-10: qty 14

## 2020-08-10 MED ORDER — PALONOSETRON HCL INJECTION 0.25 MG/5ML
INTRAVENOUS | Status: AC
  Filled 2020-08-10: qty 5

## 2020-08-10 MED ORDER — FAMOTIDINE 20 MG IN NS 100 ML IVPB
20.0000 mg | Freq: Once | INTRAVENOUS | Status: AC
  Administered 2020-08-10: 20 mg via INTRAVENOUS

## 2020-08-10 MED ORDER — DIPHENHYDRAMINE HCL 50 MG/ML IJ SOLN
50.0000 mg | Freq: Once | INTRAMUSCULAR | Status: AC
  Administered 2020-08-10: 50 mg via INTRAVENOUS

## 2020-08-10 MED ORDER — DEXAMETHASONE SODIUM PHOSPHATE 100 MG/10ML IJ SOLN
20.0000 mg | Freq: Once | INTRAMUSCULAR | Status: AC
  Administered 2020-08-10: 20 mg via INTRAVENOUS
  Filled 2020-08-10: qty 20

## 2020-08-10 MED ORDER — ACETAMINOPHEN 325 MG PO TABS
ORAL_TABLET | ORAL | Status: AC
  Filled 2020-08-10: qty 2

## 2020-08-10 MED ORDER — FAMOTIDINE 20 MG IN NS 100 ML IVPB
INTRAVENOUS | Status: AC
  Filled 2020-08-10: qty 100

## 2020-08-10 MED ORDER — SODIUM CHLORIDE 0.9 % IV SOLN
180.0000 mg | Freq: Once | INTRAVENOUS | Status: AC
  Administered 2020-08-10: 180 mg via INTRAVENOUS
  Filled 2020-08-10: qty 18

## 2020-08-10 MED ORDER — PALONOSETRON HCL INJECTION 0.25 MG/5ML
0.2500 mg | Freq: Once | INTRAVENOUS | Status: AC
  Administered 2020-08-10: 0.25 mg via INTRAVENOUS

## 2020-08-10 MED ORDER — SODIUM CHLORIDE 0.9 % IV SOLN
Freq: Once | INTRAVENOUS | Status: AC
  Filled 2020-08-10: qty 250

## 2020-08-10 NOTE — Patient Instructions (Signed)
[facility]   Discharge Instructions: Thank you for choosing [facility] to provide your oncology and hematology care.   If you have a lab appointment with the Cancer Center, please go directly to the Cancer Center and check in at the registration area.   Wear comfortable clothing and clothing appropriate for easy access to any Portacath or PICC line.   We strive to give you quality time with your provider. You may need to reschedule your appointment if you arrive late (15 or more minutes).  Arriving late affects you and other patients whose appointments are after yours.  Also, if you miss three or more appointments without notifying the office, you may be dismissed from the clinic at the provider's discretion.      For prescription refill requests, have your pharmacy contact our office and allow 72 hours for refills to be completed.    Today you received the following chemotherapy and/or immunotherapy agents: paclitaxel and carboplatin.      To help prevent nausea and vomiting after your treatment, we encourage you to take your nausea medication as directed.  BELOW ARE SYMPTOMS THAT SHOULD BE REPORTED IMMEDIATELY: *FEVER GREATER THAN 100.4 F (38 C) OR HIGHER *CHILLS OR SWEATING *NAUSEA AND VOMITING THAT IS NOT CONTROLLED WITH YOUR NAUSEA MEDICATION *UNUSUAL SHORTNESS OF BREATH *UNUSUAL BRUISING OR BLEEDING *URINARY PROBLEMS (pain or burning when urinating, or frequent urination) *BOWEL PROBLEMS (unusual diarrhea, constipation, pain near the anus) TENDERNESS IN MOUTH AND THROAT WITH OR WITHOUT PRESENCE OF ULCERS (sore throat, sores in mouth, or a toothache) UNUSUAL RASH, SWELLING OR PAIN  UNUSUAL VAGINAL DISCHARGE OR ITCHING   Items with * indicate a potential emergency and should be followed up as soon as possible or go to the Emergency Department if any problems should occur.  Please show the CHEMOTHERAPY ALERT CARD or IMMUNOTHERAPY ALERT  CARD at check-in to the Emergency Department and triage nurse.  Should you have questions after your visit or need to cancel or reschedule your appointment, please contact [facility]  Dept: [phone]  and follow the prompts.  Office hours are 8:00 a.m. to 4:30 p.m. Monday - Friday. Please note that voicemails left after 4:00 p.m. may not be returned until the following business day.  We are closed weekends and major holidays. You have access to a nurse at all times for urgent questions. Please call the main number to the clinic Dept: [phone] and follow the prompts.   For any non-urgent questions, you may also contact your provider using MyChart. We now offer e-Visits for anyone 18 and older to request care online for non-urgent symptoms. For details visit [email or web address].   Also download the MyChart app! Go to the app store, search "MyChart", open the app, select [facility], and log in with your MyChart username and password.  Due to Covid, a mask is required upon entering the hospital/clinic. If you do not have a mask, one will be given to you upon arrival. For doctor visits, patients may have 1 support person aged 18 or older with them. For treatment visits, patients cannot have anyone with them due to current Covid guidelines and our immunocompromised population.   

## 2020-08-11 ENCOUNTER — Ambulatory Visit
Admission: RE | Admit: 2020-08-11 | Discharge: 2020-08-11 | Disposition: A | Discharge status: Home / Self Care | Payer: Medicare Other | Source: Ambulatory Visit | Attending: Radiation Oncology | Admitting: Radiation Oncology

## 2020-08-11 DIAGNOSIS — Z510 Encounter for antineoplastic radiation therapy: Secondary | ICD-10-CM | POA: Diagnosis not present

## 2020-08-11 DIAGNOSIS — C3411 Malignant neoplasm of upper lobe, right bronchus or lung: Secondary | ICD-10-CM | POA: Insufficient documentation

## 2020-08-12 ENCOUNTER — Other Ambulatory Visit: Payer: Self-pay

## 2020-08-12 ENCOUNTER — Ambulatory Visit
Admission: RE | Admit: 2020-08-12 | Discharge: 2020-08-12 | Disposition: A | Discharge status: Home / Self Care | Payer: Medicare Other | Source: Ambulatory Visit | Attending: Radiation Oncology | Admitting: Radiation Oncology

## 2020-08-12 DIAGNOSIS — Z510 Encounter for antineoplastic radiation therapy: Secondary | ICD-10-CM | POA: Diagnosis not present

## 2020-08-13 ENCOUNTER — Ambulatory Visit
Admission: RE | Admit: 2020-08-13 | Discharge: 2020-08-13 | Disposition: A | Discharge status: Home / Self Care | Payer: Medicare Other | Source: Ambulatory Visit | Attending: Radiation Oncology | Admitting: Radiation Oncology

## 2020-08-13 ENCOUNTER — Inpatient Hospital Stay: Payer: Medicare Other | Attending: Internal Medicine | Admitting: Dietician

## 2020-08-13 DIAGNOSIS — Z510 Encounter for antineoplastic radiation therapy: Secondary | ICD-10-CM | POA: Diagnosis not present

## 2020-08-13 NOTE — Progress Notes (Signed)
Nutrition  Patient did not show for scheduled nutrition appointment.

## 2020-08-16 ENCOUNTER — Other Ambulatory Visit: Payer: Self-pay

## 2020-08-16 ENCOUNTER — Ambulatory Visit
Admission: RE | Admit: 2020-08-16 | Discharge: 2020-08-16 | Disposition: A | Discharge status: Home / Self Care | Payer: Medicare Other | Source: Ambulatory Visit | Attending: Radiation Oncology | Admitting: Radiation Oncology

## 2020-08-16 DIAGNOSIS — Z510 Encounter for antineoplastic radiation therapy: Secondary | ICD-10-CM | POA: Diagnosis not present

## 2020-08-17 ENCOUNTER — Inpatient Hospital Stay: Payer: Medicare Other

## 2020-08-17 ENCOUNTER — Inpatient Hospital Stay (HOSPITAL_BASED_OUTPATIENT_CLINIC_OR_DEPARTMENT_OTHER): Payer: Medicare Other | Admitting: Internal Medicine

## 2020-08-17 ENCOUNTER — Ambulatory Visit
Admission: RE | Admit: 2020-08-17 | Discharge: 2020-08-17 | Disposition: A | Discharge status: Home / Self Care | Payer: Medicare Other | Source: Ambulatory Visit | Attending: Radiation Oncology | Admitting: Radiation Oncology

## 2020-08-17 VITALS — BP 117/67 | HR 91 | Temp 96.8°F | Resp 19 | Ht 71.0 in | Wt 142.1 lb

## 2020-08-17 DIAGNOSIS — C3411 Malignant neoplasm of upper lobe, right bronchus or lung: Secondary | ICD-10-CM

## 2020-08-17 DIAGNOSIS — I639 Cerebral infarction, unspecified: Secondary | ICD-10-CM | POA: Diagnosis not present

## 2020-08-17 DIAGNOSIS — Z5111 Encounter for antineoplastic chemotherapy: Secondary | ICD-10-CM

## 2020-08-17 DIAGNOSIS — I10 Essential (primary) hypertension: Secondary | ICD-10-CM | POA: Diagnosis not present

## 2020-08-17 DIAGNOSIS — I63511 Cerebral infarction due to unspecified occlusion or stenosis of right middle cerebral artery: Secondary | ICD-10-CM | POA: Diagnosis not present

## 2020-08-17 LAB — CBC WITH DIFFERENTIAL (CANCER CENTER ONLY)
Abs Immature Granulocytes: 0.02 10*3/uL (ref 0.00–0.07)
Basophils Absolute: 0 10*3/uL (ref 0.0–0.1)
Basophils Relative: 0 %
Eosinophils Absolute: 0.1 10*3/uL (ref 0.0–0.5)
Eosinophils Relative: 1 %
HCT: 37 % — ABNORMAL LOW (ref 39.0–52.0)
Hemoglobin: 11.4 g/dL — ABNORMAL LOW (ref 13.0–17.0)
Immature Granulocytes: 1 %
Lymphocytes Relative: 17 %
Lymphs Abs: 0.7 10*3/uL (ref 0.7–4.0)
MCH: 28.7 pg (ref 26.0–34.0)
MCHC: 30.8 g/dL (ref 30.0–36.0)
MCV: 93.2 fL (ref 80.0–100.0)
Monocytes Absolute: 0.3 10*3/uL (ref 0.1–1.0)
Monocytes Relative: 8 %
Neutro Abs: 3 10*3/uL (ref 1.7–7.7)
Neutrophils Relative %: 73 %
Platelet Count: 302 10*3/uL (ref 150–400)
RBC: 3.97 MIL/uL — ABNORMAL LOW (ref 4.22–5.81)
RDW: 14 % (ref 11.5–15.5)
WBC Count: 4.2 10*3/uL (ref 4.0–10.5)
nRBC: 0 % (ref 0.0–0.2)

## 2020-08-17 LAB — CMP (CANCER CENTER ONLY)
ALT: 12 U/L (ref 0–44)
AST: 13 U/L — ABNORMAL LOW (ref 15–41)
Albumin: 3.1 g/dL — ABNORMAL LOW (ref 3.5–5.0)
Alkaline Phosphatase: 106 U/L (ref 38–126)
Anion gap: 12 (ref 5–15)
BUN: 13 mg/dL (ref 8–23)
CO2: 24 mmol/L (ref 22–32)
Calcium: 9.5 mg/dL (ref 8.9–10.3)
Chloride: 106 mmol/L (ref 98–111)
Creatinine: 0.6 mg/dL — ABNORMAL LOW (ref 0.61–1.24)
GFR, Estimated: >60 mL/min (ref >60)
Glucose, Bld: 109 mg/dL — ABNORMAL HIGH (ref 70–99)
Potassium: 4 mmol/L (ref 3.5–5.1)
Sodium: 142 mmol/L (ref 135–145)
Total Bilirubin: 0.4 mg/dL (ref 0.3–1.2)
Total Protein: 6.8 g/dL (ref 6.5–8.1)

## 2020-08-17 MED ORDER — SODIUM CHLORIDE 0.9 % IV SOLN
20.0000 mg | Freq: Once | INTRAVENOUS | Status: AC
  Administered 2020-08-17: 20 mg via INTRAVENOUS
  Filled 2020-08-17: qty 20

## 2020-08-17 MED ORDER — SODIUM CHLORIDE 0.9 % IV SOLN
45.0000 mg/m2 | Freq: Once | INTRAVENOUS | Status: AC
  Administered 2020-08-17: 84 mg via INTRAVENOUS
  Filled 2020-08-17 (×3): qty 14

## 2020-08-17 MED ORDER — DIPHENHYDRAMINE HCL 50 MG/ML IJ SOLN
50.0000 mg | Freq: Once | INTRAMUSCULAR | Status: AC
  Administered 2020-08-17: 50 mg via INTRAVENOUS

## 2020-08-17 MED ORDER — SODIUM CHLORIDE 0.9 % IV SOLN
176.6000 mg | Freq: Once | INTRAVENOUS | Status: AC
  Administered 2020-08-17: 180 mg via INTRAVENOUS
  Filled 2020-08-17: qty 18

## 2020-08-17 MED ORDER — DIPHENHYDRAMINE HCL 50 MG/ML IJ SOLN
INTRAMUSCULAR | Status: AC
  Filled 2020-08-17: qty 1

## 2020-08-17 MED ORDER — SODIUM CHLORIDE 0.9 % IV SOLN
Freq: Once | INTRAVENOUS | Status: AC
Start: 2020-08-17 — End: 2020-08-17
  Filled 2020-08-17: qty 250

## 2020-08-17 MED ORDER — FAMOTIDINE 20 MG IN NS 100 ML IVPB
20.0000 mg | Freq: Once | INTRAVENOUS | Status: AC
  Administered 2020-08-17: 20 mg via INTRAVENOUS

## 2020-08-17 MED ORDER — PALONOSETRON HCL INJECTION 0.25 MG/5ML
INTRAVENOUS | Status: AC
  Filled 2020-08-17: qty 5

## 2020-08-17 MED ORDER — FAMOTIDINE 20 MG IN NS 100 ML IVPB
INTRAVENOUS | Status: AC
  Filled 2020-08-17: qty 100

## 2020-08-17 MED ORDER — PALONOSETRON HCL INJECTION 0.25 MG/5ML
0.2500 mg | Freq: Once | INTRAVENOUS | Status: AC
  Administered 2020-08-17: 0.25 mg via INTRAVENOUS

## 2020-08-17 NOTE — Patient Instructions (Signed)
[facility]   Discharge Instructions: Thank you for choosing [facility] to provide your oncology and hematology care.   If you have a lab appointment with the Cancer Center, please go directly to the Cancer Center and check in at the registration area.   Wear comfortable clothing and clothing appropriate for easy access to any Portacath or PICC line.   We strive to give you quality time with your provider. You may need to reschedule your appointment if you arrive late (15 or more minutes).  Arriving late affects you and other patients whose appointments are after yours.  Also, if you miss three or more appointments without notifying the office, you may be dismissed from the clinic at the provider's discretion.      For prescription refill requests, have your pharmacy contact our office and allow 72 hours for refills to be completed.    Today you received the following chemotherapy and/or immunotherapy agents: paclitaxel and carboplatin.      To help prevent nausea and vomiting after your treatment, we encourage you to take your nausea medication as directed.  BELOW ARE SYMPTOMS THAT SHOULD BE REPORTED IMMEDIATELY: *FEVER GREATER THAN 100.4 F (38 C) OR HIGHER *CHILLS OR SWEATING *NAUSEA AND VOMITING THAT IS NOT CONTROLLED WITH YOUR NAUSEA MEDICATION *UNUSUAL SHORTNESS OF BREATH *UNUSUAL BRUISING OR BLEEDING *URINARY PROBLEMS (pain or burning when urinating, or frequent urination) *BOWEL PROBLEMS (unusual diarrhea, constipation, pain near the anus) TENDERNESS IN MOUTH AND THROAT WITH OR WITHOUT PRESENCE OF ULCERS (sore throat, sores in mouth, or a toothache) UNUSUAL RASH, SWELLING OR PAIN  UNUSUAL VAGINAL DISCHARGE OR ITCHING   Items with * indicate a potential emergency and should be followed up as soon as possible or go to the Emergency Department if any problems should occur.  Please show the CHEMOTHERAPY ALERT CARD or IMMUNOTHERAPY ALERT  CARD at check-in to the Emergency Department and triage nurse.  Should you have questions after your visit or need to cancel or reschedule your appointment, please contact [facility]  Dept: [phone]  and follow the prompts.  Office hours are 8:00 a.m. to 4:30 p.m. Monday - Friday. Please note that voicemails left after 4:00 p.m. may not be returned until the following business day.  We are closed weekends and major holidays. You have access to a nurse at all times for urgent questions. Please call the main number to the clinic Dept: [phone] and follow the prompts.   For any non-urgent questions, you may also contact your provider using MyChart. We now offer e-Visits for anyone 18 and older to request care online for non-urgent symptoms. For details visit [email or web address].   Also download the MyChart app! Go to the app store, search "MyChart", open the app, select [facility], and log in with your MyChart username and password.  Due to Covid, a mask is required upon entering the hospital/clinic. If you do not have a mask, one will be given to you upon arrival. For doctor visits, patients may have 1 support person aged 18 or older with them. For treatment visits, patients cannot have anyone with them due to current Covid guidelines and our immunocompromised population.   

## 2020-08-17 NOTE — Progress Notes (Signed)
[facility] [phone]   Fax:([phone]  OFFICE PROGRESS NOTE  Tamsen Roers, MD [address] [place] [address]  DIAGNOSIS: Stage IIIB (T2b, N2, M0) non-small cell lung cancer, squamous cell carcinoma.  He presented with a right upper lobe lung nodule in addition to right hilar and right paratracheal lymphadenopathy. There is irregular right hilar mass also with areas of septal thickening and nodularity in the right upper lobe raising the question of lymphangitic carcinomatosis. He was diagnosed in April 2022.  PRIOR THERAPY: None  CURRENT THERAPY: Weekly concurrent chemoradiation with carboplatin for AUC of 2 and paclitaxel 45 mg/m2.  First dose expected on 08/03/2020.  Status post 2 cycles  INTERVAL HISTORY: Cody Douglas 70 y.o. male returns to the clinic today for follow-up visit.  The patient is feeling fine today with no concerning complaints.  He continues to tolerate his treatment with concurrent chemoradiation fairly well.  He has no current nausea, vomiting, diarrhea or constipation.  He denied having any fever or chills.  He has no chest pain, shortness of breath but has mild cough with no hemoptysis.  He is here today for evaluation before starting cycle #3.  MEDICAL HISTORY: Past Medical History:  Diagnosis Date  . Anxiety   . Dyspnea    with exertion - has inhaler  . Frequent headaches   . GERD (gastroesophageal reflux disease)   . Hypertension   . Prostate cancer ([place])   . Stroke [facility])    Patient reports having a stroke on 4/13    ALLERGIES:  has No Known Allergies.  MEDICATIONS:  Current Outpatient Medications  Medication Sig Dispense Refill  . albuterol (PROVENTIL HFA;VENTOLIN HFA) 108 (90 BASE) MCG/ACT inhaler Inhale 2 puffs into the lungs every 6 (six) hours as needed for wheezing or shortness of breath. 1 Inhaler 0  . ALPRAZolam (XANAX) 0.5 MG tablet Take 1 tablet (0.5 mg total) by mouth 3 (three) times daily. LF on  06-25-20 # 90 DS 30 15 tablet 0  . aspirin 81 MG chewable tablet Chew 1 tablet (81 mg total) by mouth daily. 30 tablet 0  . atorvastatin (LIPITOR) 40 MG tablet Take 1 tablet (40 mg total) by mouth daily.    . cyclobenzaprine (FLEXERIL) 5 MG tablet Take 5 mg by mouth 3 (three) times daily as needed.    . fluconazole (DIFLUCAN) 100 MG tablet Take 1 tablet (100 mg total) by mouth daily. (Patient not taking: Reported on 08/10/2020) 10 tablet 0  . folic acid (FOLVITE) 1 MG tablet Take 1 tablet (1 mg total) by mouth daily.    . metoprolol succinate (TOPROL-XL) 25 MG 24 hr tablet Take 25 mg by mouth daily.    . Multiple Vitamins-Minerals (MULTIVITAMIN WITH MINERALS) tablet Take 1 tablet by mouth daily.    Marland Kitchen omeprazole (PRILOSEC) 20 MG capsule Take 20 mg by mouth daily.    Marland Kitchen oxyCODONE-acetaminophen (PERCOCET) 10-325 MG tablet Take 1 tablet by mouth every 8 (eight) hours as needed for pain. 10 tablet 0  . polyethylene glycol (MIRALAX / GLYCOLAX) 17 g packet Take 17 g by mouth daily.    . prochlorperazine (COMPAZINE) 10 MG tablet Take 1 tablet (10 mg total) by mouth every 6 (six) hours as needed for nausea or vomiting. 30 tablet 0  . thiamine 100 MG tablet Take 1 tablet (100 mg total) by mouth daily.     No current facility-administered medications for this visit.  SURGICAL HISTORY:  Past Surgical History:  Procedure Laterality Date  . BRONCHIAL BRUSHINGS  07/09/2020   Procedure: BRONCHIAL BRUSHINGS;  Surgeon: Garner Nash, DO;  Location: [facility];  Service: Pulmonary;;  . BRONCHIAL WASHINGS  07/09/2020   Procedure: BRONCHIAL WASHINGS;  Surgeon: Garner Nash, DO;  Location: [facility];  Service: Pulmonary;;  . CRYOTHERAPY  07/09/2020   Procedure: CRYOTHERAPY;  Surgeon: Garner Nash, DO;  Location: [facility];  Service: Pulmonary;;  . EYE SURGERY    . HEMOSTASIS CONTROL  07/09/2020   Procedure: HEMOSTASIS CONTROL;  Surgeon: Garner Nash, DO;  Location: [facility];  Service:  Pulmonary;;  . PROSTATE BIOPSY     Prostate surgery 8-9 yrs ago  . TEE WITHOUT CARDIOVERSION N/A 07/12/2020   Procedure: TRANSESOPHAGEAL ECHOCARDIOGRAM (TEE);  Surgeon: Lelon Perla, MD;  Location: [facility];  Service: Cardiovascular;  Laterality: N/A;  . VIDEO BRONCHOSCOPY WITH ENDOBRONCHIAL ULTRASOUND Bilateral 07/09/2020   Procedure: VIDEO BRONCHOSCOPY WITH ENDOBRONCHIAL ULTRASOUND;  Surgeon: Garner Nash, DO;  Location: [facility];  Service: Pulmonary;  Laterality: Bilateral;    REVIEW OF SYSTEMS:  A comprehensive review of systems was negative except for: Respiratory: positive for dyspnea on exertion   PHYSICAL EXAMINATION: General appearance: alert, cooperative, fatigued and no distress Head: Normocephalic, without obvious abnormality, atraumatic Neck: no adenopathy, no JVD, supple, symmetrical, trachea midline and thyroid not enlarged, symmetric, no tenderness/mass/nodules Lymph nodes: Cervical, supraclavicular, and axillary nodes normal. Resp: clear to auscultation bilaterally Back: symmetric, no curvature. ROM normal. No CVA tenderness. Cardio: regular rate and rhythm, S1, S2 normal, no murmur, click, rub or gallop GI: soft, non-tender; bowel sounds normal; no masses,  no organomegaly Extremities: extremities normal, atraumatic, no cyanosis or edema  ECOG PERFORMANCE STATUS: 1 - Symptomatic but completely ambulatory  Blood pressure 117/67, pulse 91, temperature (!) 96.8 F (36 C), temperature source Tympanic, resp. rate 19, height 5\' 11"  (1.803 m), weight 142 lb 1.6 oz (64.5 kg), SpO2 99 %.  LABORATORY DATA: Lab Results  Component Value Date   WBC 4.2 08/17/2020   HGB 11.4 (L) 08/17/2020   HCT 37.0 (L) 08/17/2020   MCV 93.2 08/17/2020   PLT 302 08/17/2020      Chemistry      Component Value Date/Time   NA 144 08/10/2020 0731   K 4.4 08/10/2020 0731   CL 105 08/10/2020 0731   CO2 26 08/10/2020 0731   BUN 10 08/10/2020 0731   CREATININE 0.61 08/10/2020  0731      Component Value Date/Time   CALCIUM 9.8 08/10/2020 0731   ALKPHOS 109 08/10/2020 0731   AST 13 (L) 08/10/2020 0731   ALT 10 08/10/2020 0731   BILITOT 0.3 08/10/2020 0731       RADIOGRAPHIC STUDIES: NM PET Image Initial (PI) Skull Base To Thigh  Result Date: 07/27/2020 CLINICAL DATA:  Initial treatment strategy for 70 year old male with history of non-small cell lung cancer, squamous cell carcinoma also with history of prostate cancer. History of bronchoscopy with biopsy and cryotherapy on 07/09/2020. EXAM: NUCLEAR MEDICINE PET SKULL BASE TO THIGH TECHNIQUE: 7.26 mCi F-18 FDG was injected intravenously. Full-ring PET imaging was performed from the skull base to thigh after the radiotracer. CT data was obtained and used for attenuation correction and anatomic localization. Fasting blood glucose: 116 mg/dl COMPARISON:  Chest abdomen pelvis CT 07/05/2020. FINDINGS: Mediastinal blood pool activity: SUV max 1.85 Liver activity: SUV max NA NECK: No hypermetabolic lymph nodes in the neck. Incidental CT findings: Signs  of prior RIGHT MCA infarct incompletely evaluated. CHEST: RIGHT hilar mass as on the recent CT of the chest with associated volume loss in the RIGHT middle lobe and RIGHT upper lobe. Mass at the level of the hilum measuring approximately 4.3 x 1.5 cm showing intense FDG uptake. Size not substantially changed compared to recent CT evaluation. Maximum SUV on the current exam of 14.2. RIGHT paratracheal lymph node measuring 0.9 cm on image 68 of series 4 with intense FDG uptake. Maximum SUV 13.8. (Image 70/3) Nodularity and septal thickening at the RIGHT lung apex is similar to the prior exam. This displays mildly increased metabolic activity with a maximum SUV of 2.8 (image 68/3) no effusion. Nodular areas along the pleura, largest in the RIGHT upper lobe is contiguous with bandlike changes but measures 1 x 0.6 cm also showing low level FDG uptake as does ground-glass surrounding  bandlike changes that extend into the RIGHT upper lobe with similar uptake to the dominant area measuring 3.3 x 1.3 cm on image 68 of series 4. FDG uptake is outlined above a 2.88. Incidental CT findings: Calcified atheromatous plaque in the thoracic aorta without aneurysmal dilation. Normal caliber central pulmonary vasculature. Signs of coronary artery disease as before. Normal heart size without substantial pericardial effusion. ABDOMEN/PELVIS: No signs of abnormal uptake in solid organs or nodal pathways in the abdomen or pelvis to indicate metastatic disease. Incidental CT findings: No acute findings relative to liver, gallbladder, pancreas, spleen, adrenal glands and kidneys. Urinary bladder is decompressed. No acute findings relative to bowel.  Colonic diverticulosis. 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 Mild stranding about the vas deferens in the RIGHT hemipelvis with expansion of the vas deferens when compared to the previous CT of the abdomen and pelvis from April of 2022. There is intense metabolic activity adjacent to the vast difference favored to represent the RIGHT ureter. However, slightly away from this there is mildly increased metabolic activity in this location. No significant FDG uptake in the area of the prostate. SKELETON: No focal hypermetabolic activity to suggest skeletal metastasis. Incidental CT findings: Spinal degenerative changes. IMPRESSION: Signs of large RIGHT hilar mass with intense metabolic activity and associated RIGHT paratracheal adenopathy. Irregular RIGHT hilar mass also with areas of septal thickening and nodularity in the RIGHT upper lobe raising the question of lymphangitic carcinomatosis. FDG uptake in this area is lower level. Morphologic features remain suspicious. An overlay of postobstructive pneumonitis is also considered. Above mass extends into mediastinal fat  from the RIGHT hilum and continues to show volume loss in the adjacent RIGHT upper lobe. No additional signs of metastatic disease. Stranding around the RIGHT vas deferens (image 181/4 with low level metabolic activity difficult to separate from adjacent ureter in terms of metabolic activity. Findings represent a change from the recent study inter favored to represent an inflammatory process. Correlate with any pelvic symptoms and consider interval follow-up contrasted imaging given the patient's history of prostate neoplasm. 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 Recommend follow-up every 3 years. This recommendation follows ACR consensus guidelines: White Paper of the ACR Incidental Findings Committee II on Vascular Findings. J Am Coll Radiol 2013; 10:789-794. Aortic Atherosclerosis (ICD10-I70.0). Electronically Signed   By: Zetta Bills M.D.   On: 07/27/2020 11:53    ASSESSMENT AND PLAN: This is a very pleasant 70 years old white male recently diagnosed with a stage IIIb (T2b, N2, M0) non-small cell lung cancer, squamous  cell carcinoma diagnosed in April 2022.  The patient is currently undergoing a course of concurrent chemoradiation with weekly carboplatin for AUC of 2 and paclitaxel 45 Mg/M2 status post 2 cycles. The patient continues to tolerate his treatment well with no concerning adverse effects. I recommended for him to proceed with cycle #3 today as planned. I will see him back for follow-up visit in 2 weeks for evaluation before starting cycle #5. The patient was advised to call immediately if he has any other concerning symptoms in the interval. The patient voices understanding of current disease status and treatment options and is in agreement with the current care plan.  All questions were answered. The patient knows to call the clinic with any problems, questions or concerns. We can certainly see the patient much sooner if necessary.  The total time spent in the appointment was 20  minutes.  Disclaimer: This note was dictated with voice recognition software. Similar sounding words can inadvertently be transcribed and may not be corrected upon review.

## 2020-08-18 ENCOUNTER — Ambulatory Visit
Admission: RE | Admit: 2020-08-18 | Discharge: 2020-08-18 | Disposition: A | Discharge status: Home / Self Care | Payer: Medicare Other | Source: Ambulatory Visit | Attending: Radiation Oncology | Admitting: Radiation Oncology

## 2020-08-19 ENCOUNTER — Ambulatory Visit
Admission: RE | Admit: 2020-08-19 | Discharge: 2020-08-19 | Disposition: A | Discharge status: Home / Self Care | Payer: Medicare Other | Source: Ambulatory Visit | Attending: Radiation Oncology | Admitting: Radiation Oncology

## 2020-08-19 ENCOUNTER — Other Ambulatory Visit: Payer: Self-pay

## 2020-08-20 ENCOUNTER — Emergency Department (HOSPITAL_COMMUNITY): Payer: Medicare Other

## 2020-08-20 ENCOUNTER — Telehealth: Payer: Self-pay | Admitting: *Deleted

## 2020-08-20 ENCOUNTER — Other Ambulatory Visit: Payer: Self-pay

## 2020-08-20 ENCOUNTER — Ambulatory Visit
Admission: RE | Admit: 2020-08-20 | Discharge: 2020-08-20 | Disposition: A | Discharge status: Home / Self Care | Payer: Medicare Other | Source: Ambulatory Visit | Attending: Radiation Oncology | Admitting: Radiation Oncology

## 2020-08-20 ENCOUNTER — Encounter (HOSPITAL_COMMUNITY): Payer: Self-pay | Admitting: Internal Medicine

## 2020-08-20 ENCOUNTER — Inpatient Hospital Stay (HOSPITAL_COMMUNITY)
Admission: EM | Admit: 2020-08-20 | Discharge: 2020-08-30 | DRG: 065 | Disposition: A | Discharge status: Hospice | Payer: Medicare Other | Source: Skilled Nursing Facility | Attending: Internal Medicine | Admitting: Internal Medicine

## 2020-08-20 ENCOUNTER — Inpatient Hospital Stay (HOSPITAL_COMMUNITY): Payer: Medicare Other

## 2020-08-20 DIAGNOSIS — I110 Hypertensive heart disease with heart failure: Secondary | ICD-10-CM | POA: Diagnosis present

## 2020-08-20 DIAGNOSIS — I058 Other rheumatic mitral valve diseases: Secondary | ICD-10-CM | POA: Diagnosis present

## 2020-08-20 DIAGNOSIS — R29810 Facial weakness: Secondary | ICD-10-CM | POA: Diagnosis present

## 2020-08-20 DIAGNOSIS — I6389 Other cerebral infarction: Secondary | ICD-10-CM | POA: Diagnosis present

## 2020-08-20 DIAGNOSIS — Z79899 Other long term (current) drug therapy: Secondary | ICD-10-CM

## 2020-08-20 DIAGNOSIS — D6832 Hemorrhagic disorder due to extrinsic circulating anticoagulants: Secondary | ICD-10-CM | POA: Diagnosis present

## 2020-08-20 DIAGNOSIS — I69354 Hemiplegia and hemiparesis following cerebral infarction affecting left non-dominant side: Secondary | ICD-10-CM | POA: Diagnosis not present

## 2020-08-20 DIAGNOSIS — R4781 Slurred speech: Secondary | ICD-10-CM | POA: Diagnosis present

## 2020-08-20 DIAGNOSIS — C3411 Malignant neoplasm of upper lobe, right bronchus or lung: Secondary | ICD-10-CM | POA: Diagnosis present

## 2020-08-20 DIAGNOSIS — R471 Dysarthria and anarthria: Secondary | ICD-10-CM | POA: Diagnosis present

## 2020-08-20 DIAGNOSIS — Z7289 Other problems related to lifestyle: Secondary | ICD-10-CM | POA: Diagnosis not present

## 2020-08-20 DIAGNOSIS — Z7901 Long term (current) use of anticoagulants: Secondary | ICD-10-CM | POA: Diagnosis not present

## 2020-08-20 DIAGNOSIS — I77810 Thoracic aortic ectasia: Secondary | ICD-10-CM | POA: Diagnosis present

## 2020-08-20 DIAGNOSIS — I340 Nonrheumatic mitral (valve) insufficiency: Secondary | ICD-10-CM | POA: Diagnosis not present

## 2020-08-20 DIAGNOSIS — Z801 Family history of malignant neoplasm of trachea, bronchus and lung: Secondary | ICD-10-CM

## 2020-08-20 DIAGNOSIS — I472 Ventricular tachycardia: Secondary | ICD-10-CM | POA: Diagnosis present

## 2020-08-20 DIAGNOSIS — M62838 Other muscle spasm: Secondary | ICD-10-CM | POA: Diagnosis present

## 2020-08-20 DIAGNOSIS — R042 Hemoptysis: Secondary | ICD-10-CM | POA: Diagnosis present

## 2020-08-20 DIAGNOSIS — Z66 Do not resuscitate: Secondary | ICD-10-CM | POA: Diagnosis present

## 2020-08-20 DIAGNOSIS — I63511 Cerebral infarction due to unspecified occlusion or stenosis of right middle cerebral artery: Secondary | ICD-10-CM | POA: Diagnosis present

## 2020-08-20 DIAGNOSIS — I5030 Unspecified diastolic (congestive) heart failure: Secondary | ICD-10-CM | POA: Diagnosis not present

## 2020-08-20 DIAGNOSIS — I639 Cerebral infarction, unspecified: Secondary | ICD-10-CM | POA: Diagnosis present

## 2020-08-20 DIAGNOSIS — I080 Rheumatic disorders of both mitral and aortic valves: Secondary | ICD-10-CM | POA: Diagnosis present

## 2020-08-20 DIAGNOSIS — F17210 Nicotine dependence, cigarettes, uncomplicated: Secondary | ICD-10-CM | POA: Diagnosis present

## 2020-08-20 DIAGNOSIS — Z681 Body mass index (BMI) 19 or less, adult: Secondary | ICD-10-CM | POA: Diagnosis not present

## 2020-08-20 DIAGNOSIS — Z923 Personal history of irradiation: Secondary | ICD-10-CM | POA: Diagnosis not present

## 2020-08-20 DIAGNOSIS — I358 Other nonrheumatic aortic valve disorders: Secondary | ICD-10-CM | POA: Diagnosis not present

## 2020-08-20 DIAGNOSIS — I10 Essential (primary) hypertension: Secondary | ICD-10-CM | POA: Diagnosis present

## 2020-08-20 DIAGNOSIS — I422 Other hypertrophic cardiomyopathy: Secondary | ICD-10-CM | POA: Diagnosis present

## 2020-08-20 DIAGNOSIS — B370 Candidal stomatitis: Secondary | ICD-10-CM | POA: Diagnosis present

## 2020-08-20 DIAGNOSIS — I38 Endocarditis, valve unspecified: Secondary | ICD-10-CM | POA: Diagnosis not present

## 2020-08-20 DIAGNOSIS — T451X5A Adverse effect of antineoplastic and immunosuppressive drugs, initial encounter: Secondary | ICD-10-CM | POA: Diagnosis present

## 2020-08-20 DIAGNOSIS — R29706 NIHSS score 6: Secondary | ICD-10-CM | POA: Diagnosis present

## 2020-08-20 DIAGNOSIS — Z515 Encounter for palliative care: Secondary | ICD-10-CM

## 2020-08-20 DIAGNOSIS — F419 Anxiety disorder, unspecified: Secondary | ICD-10-CM | POA: Diagnosis present

## 2020-08-20 DIAGNOSIS — K219 Gastro-esophageal reflux disease without esophagitis: Secondary | ICD-10-CM | POA: Diagnosis present

## 2020-08-20 DIAGNOSIS — R0609 Other forms of dyspnea: Secondary | ICD-10-CM | POA: Diagnosis present

## 2020-08-20 DIAGNOSIS — Z7189 Other specified counseling: Secondary | ICD-10-CM | POA: Diagnosis not present

## 2020-08-20 DIAGNOSIS — R636 Underweight: Secondary | ICD-10-CM | POA: Diagnosis present

## 2020-08-20 DIAGNOSIS — Z8546 Personal history of malignant neoplasm of prostate: Secondary | ICD-10-CM

## 2020-08-20 DIAGNOSIS — Z20822 Contact with and (suspected) exposure to covid-19: Secondary | ICD-10-CM | POA: Diagnosis present

## 2020-08-20 DIAGNOSIS — G839 Paralytic syndrome, unspecified: Secondary | ICD-10-CM | POA: Diagnosis present

## 2020-08-20 DIAGNOSIS — Z789 Other specified health status: Secondary | ICD-10-CM

## 2020-08-20 DIAGNOSIS — Z7982 Long term (current) use of aspirin: Secondary | ICD-10-CM

## 2020-08-20 DIAGNOSIS — Z8249 Family history of ischemic heart disease and other diseases of the circulatory system: Secondary | ICD-10-CM

## 2020-08-20 DIAGNOSIS — I351 Nonrheumatic aortic (valve) insufficiency: Secondary | ICD-10-CM | POA: Diagnosis not present

## 2020-08-20 DIAGNOSIS — G8929 Other chronic pain: Secondary | ICD-10-CM | POA: Diagnosis present

## 2020-08-20 DIAGNOSIS — F1010 Alcohol abuse, uncomplicated: Secondary | ICD-10-CM | POA: Diagnosis present

## 2020-08-20 DIAGNOSIS — R54 Age-related physical debility: Secondary | ICD-10-CM | POA: Diagnosis present

## 2020-08-20 DIAGNOSIS — G43909 Migraine, unspecified, not intractable, without status migrainosus: Secondary | ICD-10-CM | POA: Diagnosis present

## 2020-08-20 DIAGNOSIS — R233 Spontaneous ecchymoses: Secondary | ICD-10-CM | POA: Diagnosis not present

## 2020-08-20 LAB — DIFFERENTIAL
Abs Immature Granulocytes: 0.03 10*3/uL (ref 0.00–0.07)
Basophils Absolute: 0 10*3/uL (ref 0.0–0.1)
Basophils Relative: 0 %
Eosinophils Absolute: 0 10*3/uL (ref 0.0–0.5)
Eosinophils Relative: 1 %
Immature Granulocytes: 1 %
Lymphocytes Relative: 10 %
Lymphs Abs: 0.7 10*3/uL (ref 0.7–4.0)
Monocytes Absolute: 0.4 10*3/uL (ref 0.1–1.0)
Monocytes Relative: 6 %
Neutro Abs: 5.4 10*3/uL (ref 1.7–7.7)
Neutrophils Relative %: 82 %

## 2020-08-20 LAB — CBC
HCT: 36.8 % — ABNORMAL LOW (ref 39.0–52.0)
Hemoglobin: 11.5 g/dL — ABNORMAL LOW (ref 13.0–17.0)
MCH: 29 pg (ref 26.0–34.0)
MCHC: 31.3 g/dL (ref 30.0–36.0)
MCV: 92.7 fL (ref 80.0–100.0)
Platelets: 316 10*3/uL (ref 150–400)
RBC: 3.97 MIL/uL — ABNORMAL LOW (ref 4.22–5.81)
RDW: 14.1 % (ref 11.5–15.5)
WBC: 6.6 10*3/uL (ref 4.0–10.5)
nRBC: 0 % (ref 0.0–0.2)

## 2020-08-20 LAB — CBG MONITORING, ED: Glucose-Capillary: 106 mg/dL — ABNORMAL HIGH (ref 70–99)

## 2020-08-20 LAB — APTT: aPTT: 30 seconds (ref 24–36)

## 2020-08-20 LAB — COMPREHENSIVE METABOLIC PANEL
ALT: 15 U/L (ref 0–44)
AST: 18 U/L (ref 15–41)
Albumin: 3.3 g/dL — ABNORMAL LOW (ref 3.5–5.0)
Alkaline Phosphatase: 93 U/L (ref 38–126)
Anion gap: 12 (ref 5–15)
BUN: 9 mg/dL (ref 8–23)
CO2: 25 mmol/L (ref 22–32)
Calcium: 9.1 mg/dL (ref 8.9–10.3)
Chloride: 101 mmol/L (ref 98–111)
Creatinine, Ser: 0.51 mg/dL — ABNORMAL LOW (ref 0.61–1.24)
GFR, Estimated: >60 mL/min (ref >60)
Glucose, Bld: 118 mg/dL — ABNORMAL HIGH (ref 70–99)
Potassium: 3.8 mmol/L (ref 3.5–5.1)
Sodium: 138 mmol/L (ref 135–145)
Total Bilirubin: 0.8 mg/dL (ref 0.3–1.2)
Total Protein: 6.5 g/dL (ref 6.5–8.1)

## 2020-08-20 LAB — PROTIME-INR
INR: 1 (ref 0.8–1.2)
Prothrombin Time: 13.2 seconds (ref 11.4–15.2)

## 2020-08-20 LAB — I-STAT CHEM 8, ED
BUN: 11 mg/dL (ref 8–23)
Calcium, Ion: 1.1 mmol/L — ABNORMAL LOW (ref 1.15–1.40)
Chloride: 103 mmol/L (ref 98–111)
Creatinine, Ser: 0.4 mg/dL — ABNORMAL LOW (ref 0.61–1.24)
Glucose, Bld: 109 mg/dL — ABNORMAL HIGH (ref 70–99)
HCT: 41 % (ref 39.0–52.0)
Hemoglobin: 13.9 g/dL (ref 13.0–17.0)
Potassium: 3.6 mmol/L (ref 3.5–5.1)
Sodium: 140 mmol/L (ref 135–145)
TCO2: 24 mmol/L (ref 22–32)

## 2020-08-20 IMAGING — CT CT HEAD CODE STROKE
3 series · 14 of 47 positions shown, 16 images · non-contrast
Comparison: MRI [DATE]

CLINICAL DATA: Code stroke.  Weakness and facial droop

EXAM:
CT HEAD WITHOUT CONTRAST
TECHNIQUE: Contiguous axial images were obtained from the base of the skull
through the vertex without intravenous contrast.

[Series 3: head 5.0 st · axial · 0.42mm/px · z∈[-152,-12]mm · 8 of 34 slices shown, 10 images]
[im 3/34  brain]
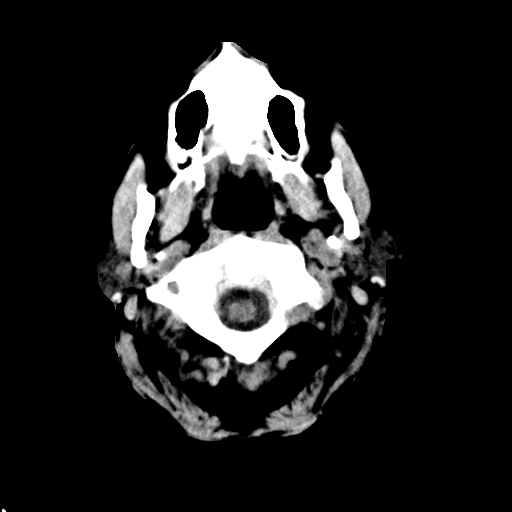
[im 3/34  bone]
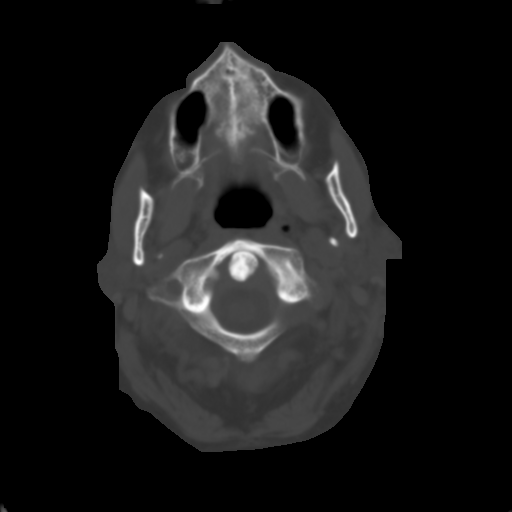
[im 7/34  brain]
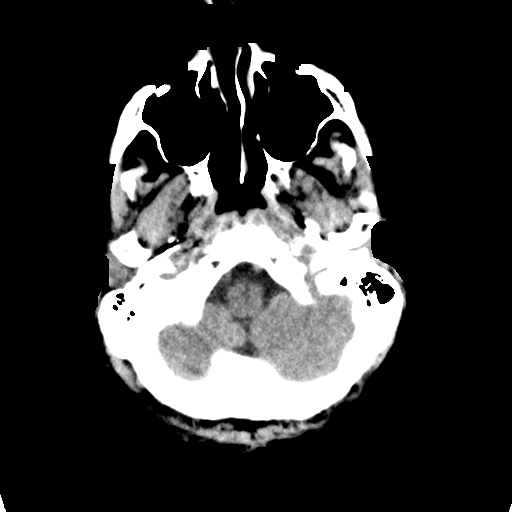
[im 11/34  brain]
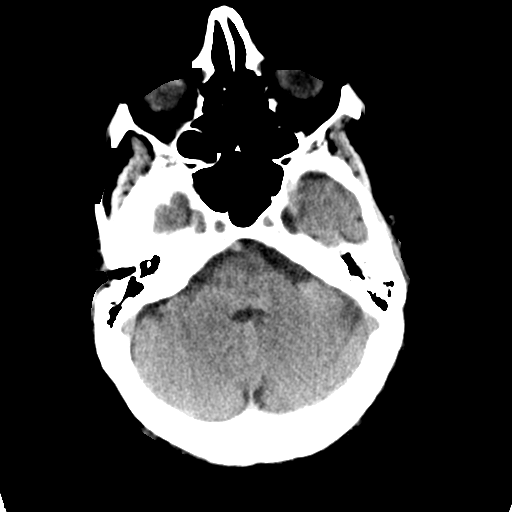
[im 15/34  brain]
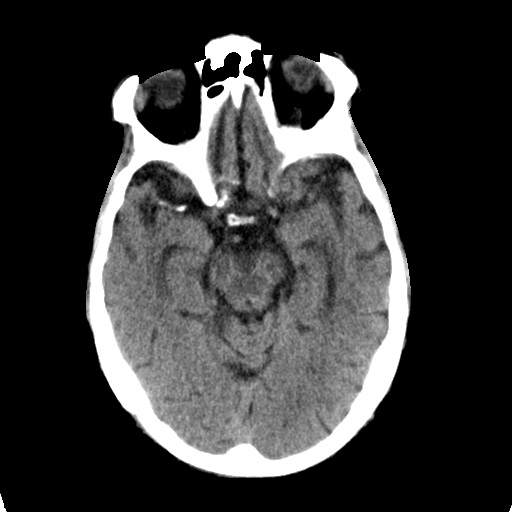
[im 19/34  brain]
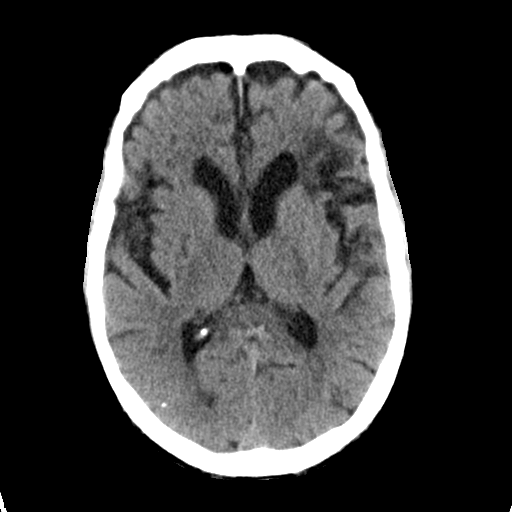
[im 19/34  bone]
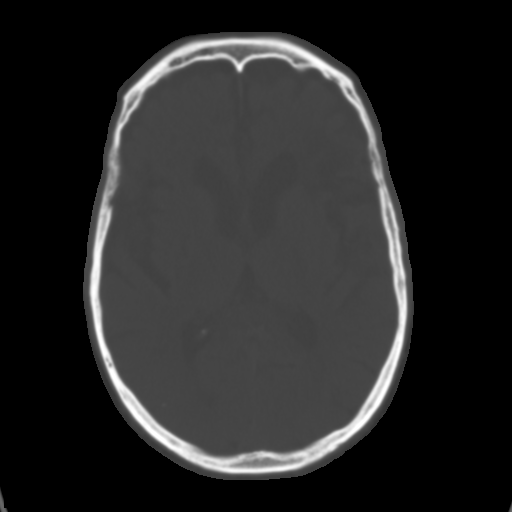
[im 23/34  brain]
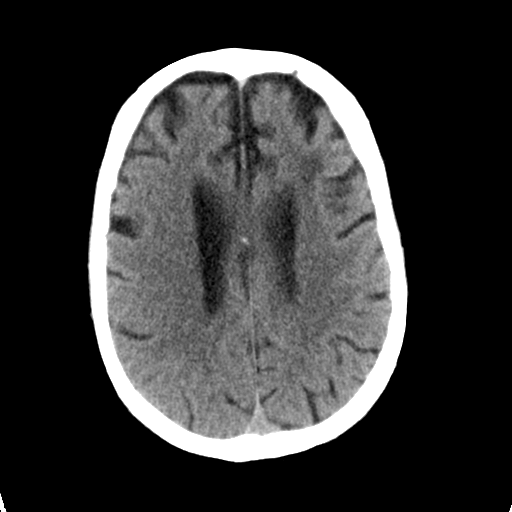
[im 27/34  brain]
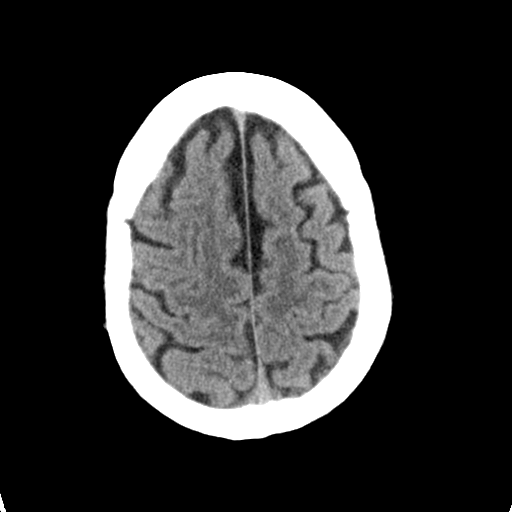
[im 31/34  brain]
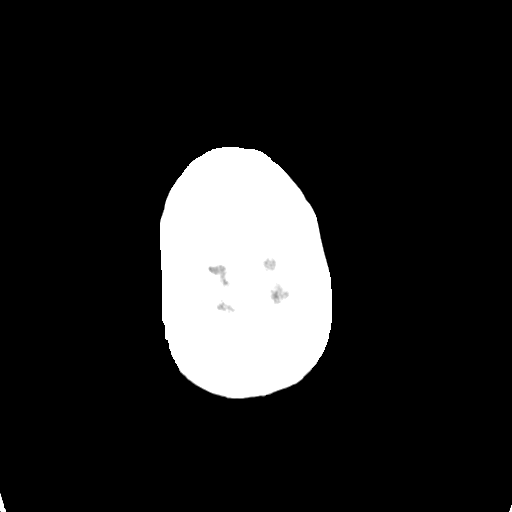

[Series 5: head 3.0 cor st · coronal · 0.33mm/px · 3 of 69 slices shown]
[im 23/69  brain]
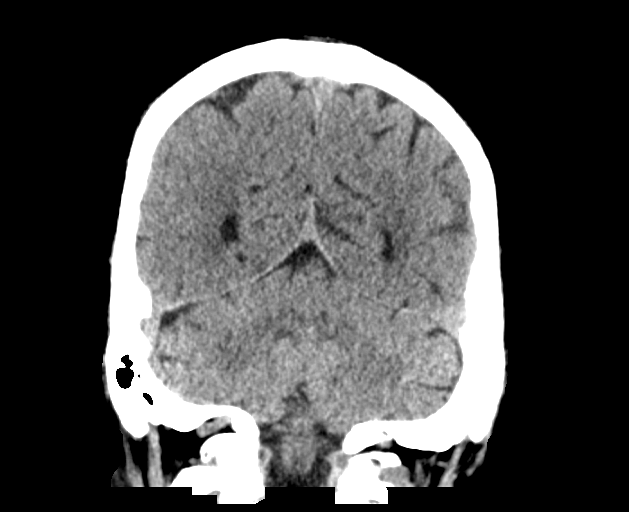
[im 31/69  brain]
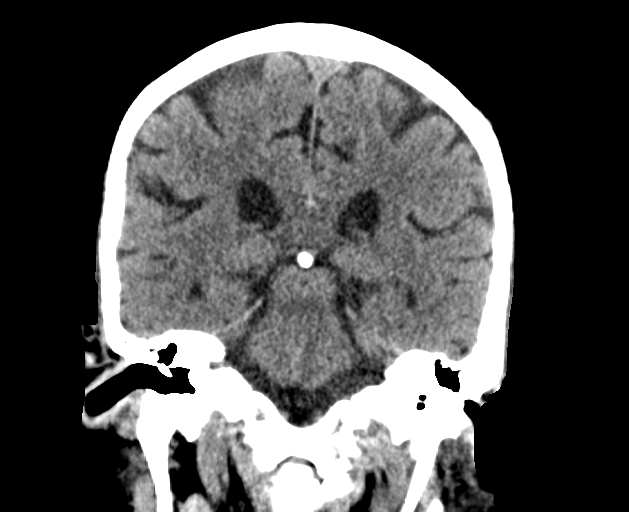
[im 38/69  brain]
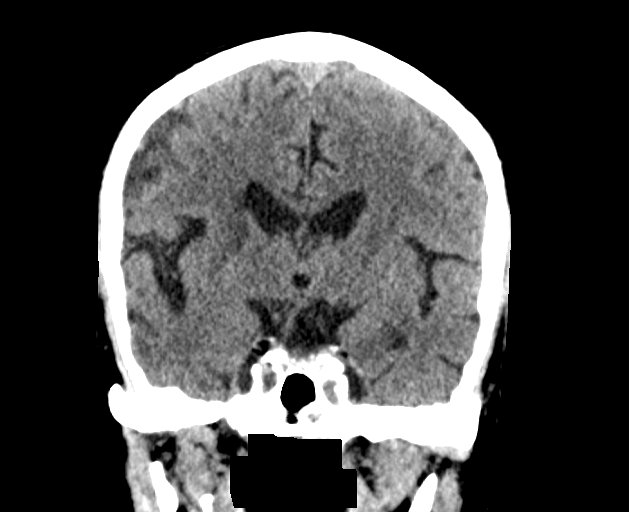

[Series 6: head 3.0 sag st · sagittal · 0.33mm/px · 3 of 66 slices shown]
[im 22/66  brain]
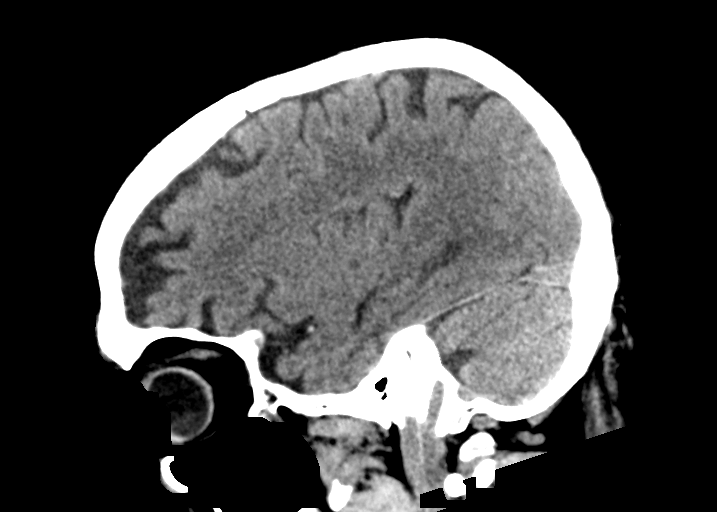
[im 33/66  brain]
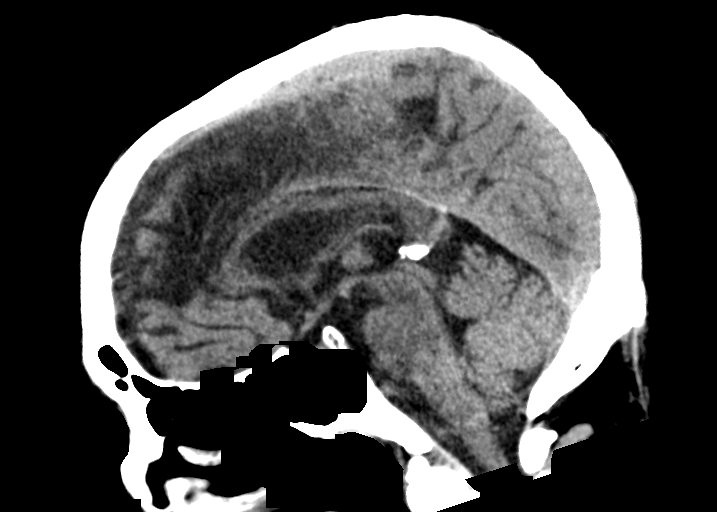
[im 44/66  brain]
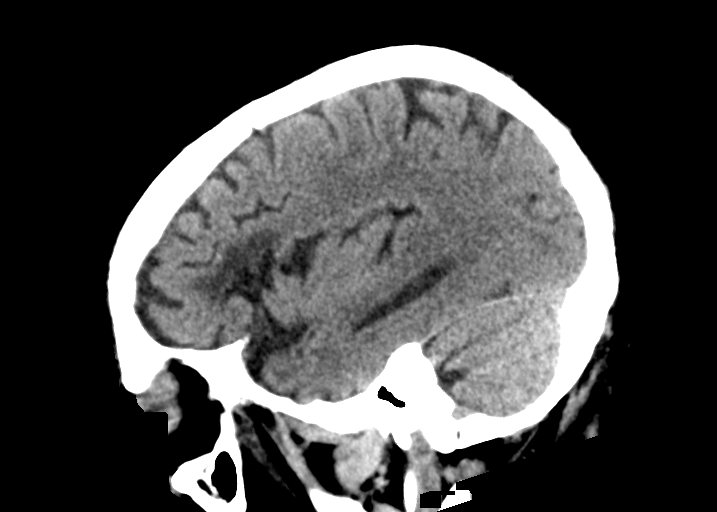

[14 of 47 positions shown; findings below may reference images not displayed]

FINDINGS: Brain: There is no acute intracranial hemorrhage, mass effect, or
edema. No acute appearing loss of gray-white differentiation.
Infarction of the right corona radiata and basal ganglia seen on
prior MRI is identified. There is a chronic left frontal infarction
involving the operculum. There may be a chronic cortical infarct of
the posterior right opercular region. Additional patchy
hypoattenuation in the supratentorial white matter is nonspecific
but probably reflects chronic microvascular ischemic changes.
Prominence of the ventricles and sulci reflects generalized
parenchymal volume loss. No extra-axial collection.

Vascular: No hyperdense vessel. There are foci of calcification
along the right M1 and M2 MCA.

Skull: Unremarkable.

Sinuses/Orbits: No acute finding.

Other: Mastoid air cells are clear.

ASPECTS ([place]ta Stroke Program Early CT Score)

- Ganglionic level infarction (caudate, lentiform nuclei, internal
capsule, insula, M1-M3 cortex): 7

- Supraganglionic infarction (M4-M6 cortex): 3

Total score (0-10 with 10 being normal): 10
IMPRESSION: There is no acute intracranial hemorrhage or evidence of acute
infarction. ASPECT score is 10.

Chronic infarcts as described.

Foci of calcification along the right M1 and M2 MCA. Could reflect
calcified emboli. Right M1 MCA irregularity on the prior MRA is
likely related.

These results were communicated to Dr. KAYAKKODI at [DATE] on
[DATE] by text page via the [employer] messaging system.

## 2020-08-20 IMAGING — MR MR HEAD WO/W CM
12 of 17 series · 29 of 48 positions shown · IV contrast (gadavist)
Comparison: Prior CT from [DATE] and MRI from [DATE].

CLINICAL DATA: Initial evaluation for acute left-sided weakness,
stroke.

EXAM:
MRI HEAD WITHOUT AND WITH CONTRAST
MRA HEAD WITHOUT CONTRAST
MRA NECK WITHOUT AND WITH CONTRAST
TECHNIQUE: Multiplanar, multi-echo pulse sequences of the brain and surrounding
structures were acquired without intravenous contrast. Angiographic
images of the Circle of Willis were acquired using MRA technique
without intravenous contrast. Angiographic images of the neck were
acquired using MRA technique without and with intravenous contrast.
Carotid stenosis measurements (when applicable) are obtained
utilizing NASCET criteria, using the distal internal carotid
diameter as the denominator.
CONTRAST:  6.5mL GADAVIST GADOBUTROL 1 MMOL/ML IV SOLN

[Series 5: DWI · axial · 3.0mm · 0.88mm/px · z∈[-78,+69]mm · 5 of 100 slices shown (1 of 4)]
[im 1/100]
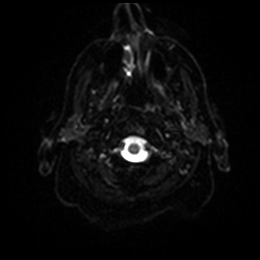
[im 25/100]
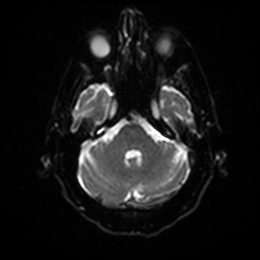
[im 50/100]
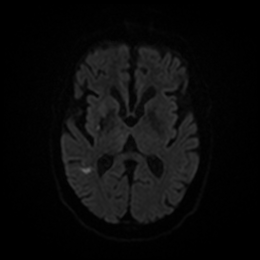
[im 75/100]
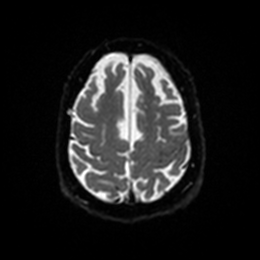
[im 100/100]
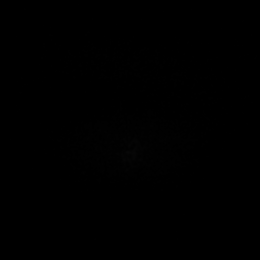

[Series 6: DWI · axial · 3.0mm · 0.88mm/px · z∈[-78,+69]mm · 3 of 50 slices shown (2 of 4)]
[im 1/50]
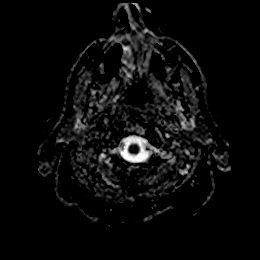
[im 25/50]
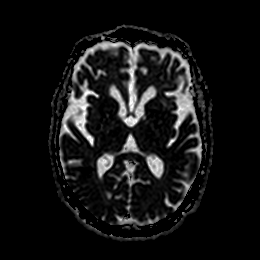
[im 50/50]
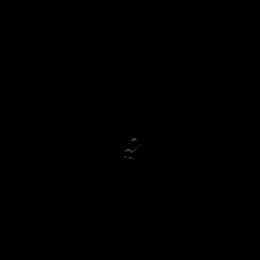

[Series 7: DWI · coronal · 4.0mm · 0.88mm/px · 4 of 66 slices shown (3 of 4)]
[im 1/66]
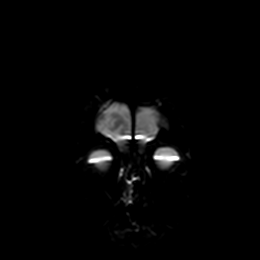
[im 22/66]
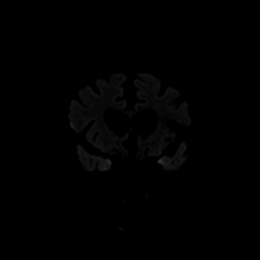
[im 44/66]
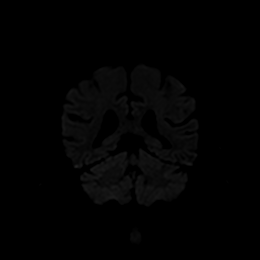
[im 66/66]
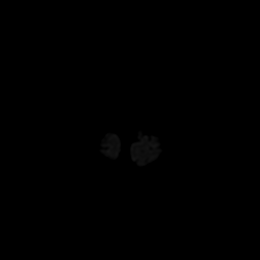

[Series 8: DWI · coronal · 4.0mm · 0.88mm/px · 2 of 33 slices shown (4 of 4)]
[im 1/33]
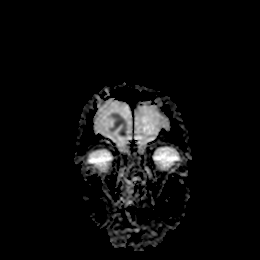
[im 33/33]
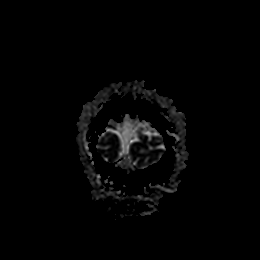

[Series 13: T1 · sagittal · 5.0mm · 0.75mm/px · 1 of 23 slices shown]
[im 1/23]
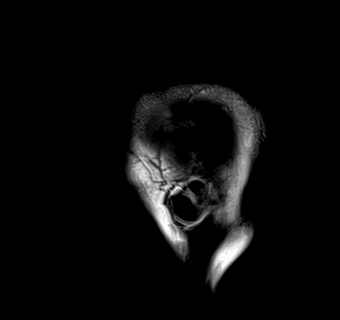

[Series 14: T2 · axial · 5.0mm · 0.72mm/px · z∈[-77,+67]mm · 2 of 25 slices shown (1 of 2)]
[im 1/25]
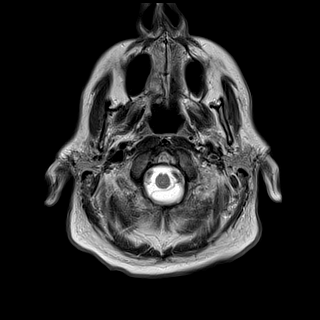
[im 25/25]
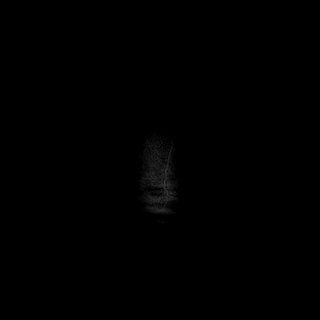

[Series 15: FLAIR · axial · 5.0mm · 0.45mm/px · z∈[-77,+67]mm · 2 of 25 slices shown]
[im 1/25]
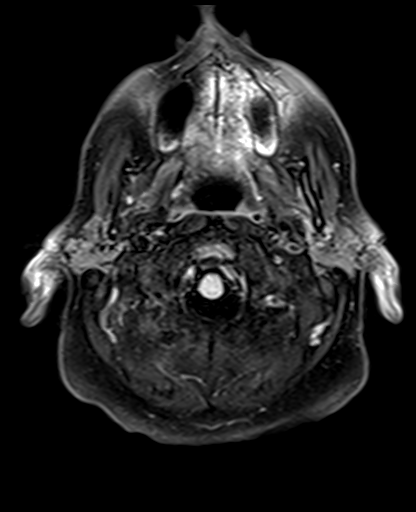
[im 25/25]
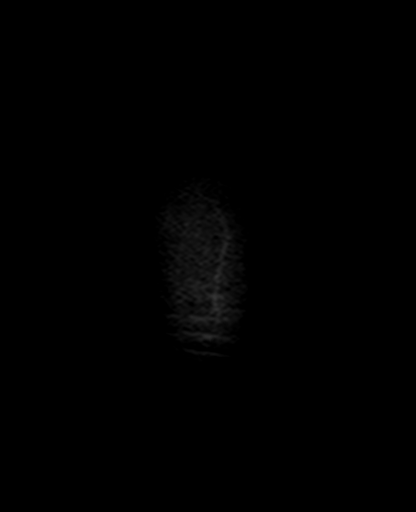

[Series 16: mag_images · axial · 3.0mm · 0.90mm/px · z∈[-93,+24]mm · 3 of 60 slices shown]
[im 1/60]
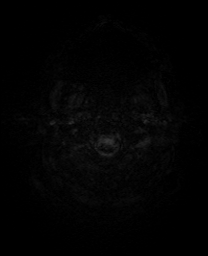
[im 20/60]
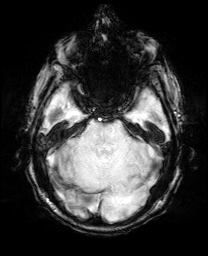
[im 40/60]
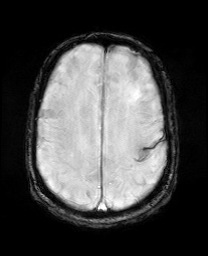

[Series 21: T2 · coronal · 5.0mm · 0.34mm/px · 2 of 29 slices shown (2 of 2)]
[im 1/29]
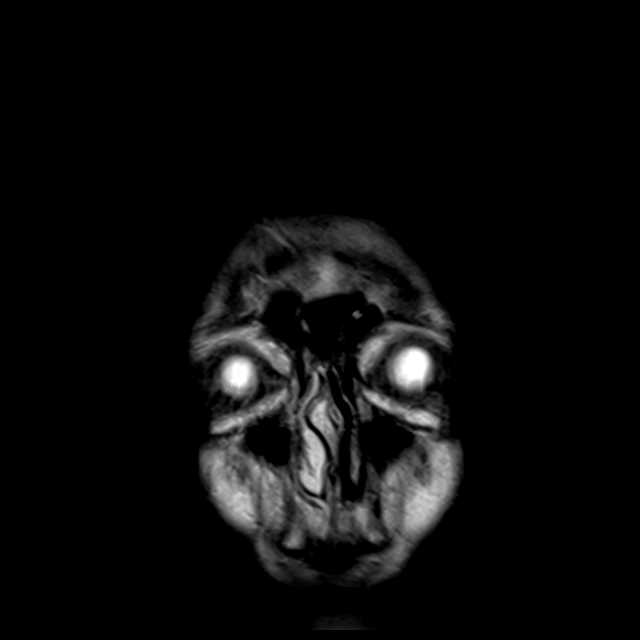
[im 29/29]
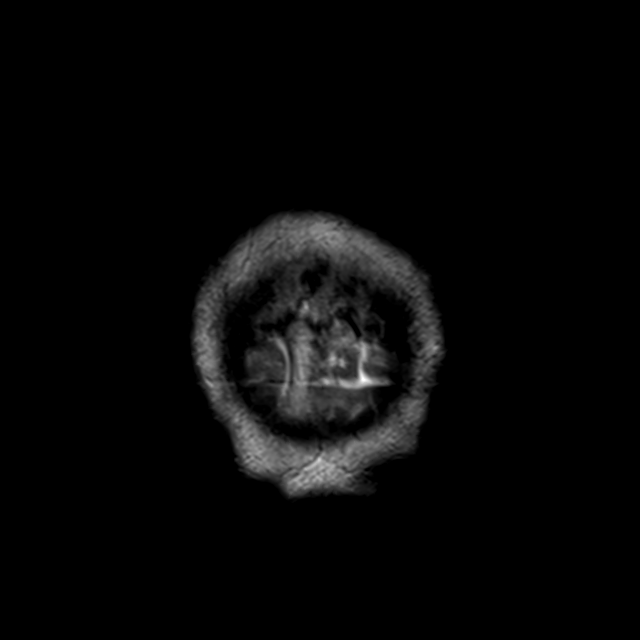

[Series 26: T2 post-contrast · coronal · 5.0mm · 0.72mm/px · 2 of 28 slices shown]
[im 1/28]
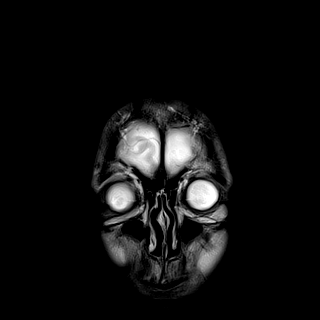
[im 28/28]
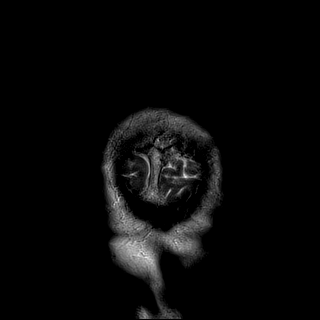

[Series 28: T1 post-contrast · coronal · 5.0mm · 0.34mm/px · 2 of 28 slices shown (1 of 2)]
[im 1/28]
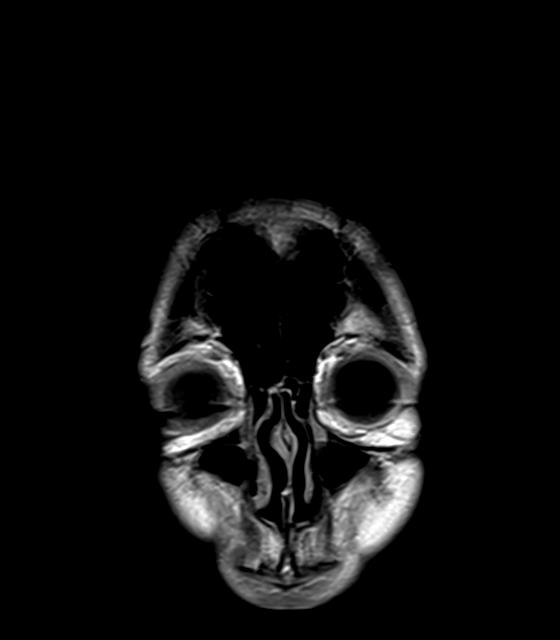
[im 28/28]
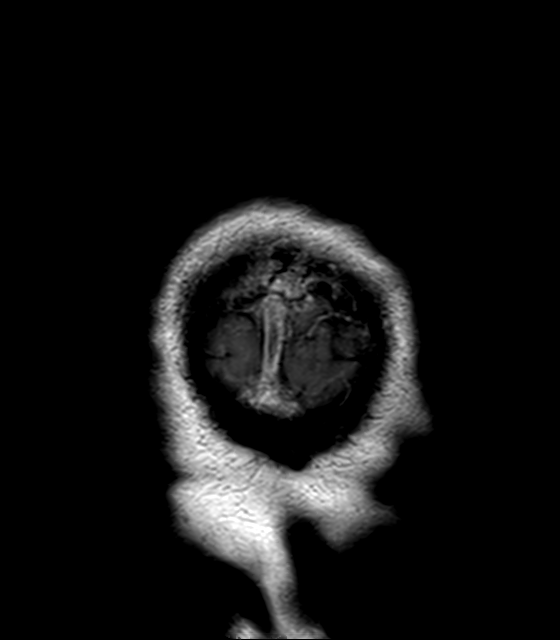

[Series 29: T1 post-contrast · sagittal · 5.0mm · 0.72mm/px · 1 of 23 slices shown (2 of 2)]
[im 1/23]
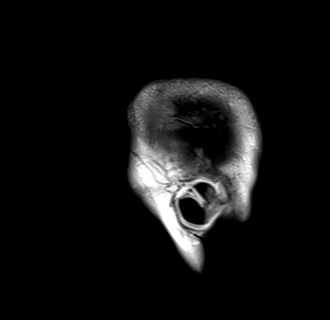

[29 of 48 positions shown; findings below may reference images not displayed]

FINDINGS: MRI HEAD FINDINGS

Brain: Diffuse prominence of the CSF containing spaces compatible
with generalized cerebral atrophy. Encephalomalacia and gliosis
involving the cortical and subcortical aspect of the anterior left
frontal lobe consistent with a chronic ischemic infarct, stable.
There has been interval evolution of previously identified infarct
at the right basal ganglia, now largely chronic in appearance.
Associated susceptibility artifact now seen at this location,
consistent with petechial hemorrhage. Intrinsic T1 hyperintensity
likely reflects associated mineralization. Few scattered remote
bilateral cerebellar infarcts noted as well.

Patchy foci of restricted diffusion are seen involving the posterior
right basal ganglia, immediately adjacent to the area prior
infarction (series 5, image 79). Mild extension into the adjacent
right external capsule. Additional patchy involvement of the right
periatrial white matter (series 5, image 75) as well as the right
occipital cortex (series 5, image 74). No significant mass effect.
Scattered foci of susceptibility artifact at the right temporal
occipital region is new from previous, and could reflect associated
petechial hemorrhage (series 18, image 27). Subtle patchy cortical
involvement of the right frontal operculum noted as well (a series
5, image 77).

No other evidence for acute or subacute ischemia. Gray-white matter
differentiation otherwise maintained. No other acute intracranial
hemorrhage. Chronic superficial siderosis along the cortical sulci
at the left parietal lobe noted (series 18, image 45), stable from
previous, and suggesting prior subarachnoid hemorrhage at this
location.

No mass lesion, midline shift or mass effect. No hydrocephalus or
extra-axial fluid collection. Pituitary gland suprasellar region
normal. Midline structures intact.

No abnormal enhancement.

Pituitary gland and suprasellar region are normal. Midline
structures intact and normal.

Vascular: Major intracranial vascular flow voids are maintained.

Skull and upper cervical spine: Craniocervical junction within
normal limits. Bone marrow signal intensity normal. No focal marrow
replacing lesion. No scalp soft tissue abnormality.

Sinuses/Orbits: Patient status post bilateral ocular lens
replacement. Scattered mucosal thickening noted within the ethmoidal
air cells. Paranasal sinuses are otherwise clear. No significant
mastoid effusion. Inner ear structures grossly normal.

Other: None.

MRA HEAD FINDINGS

ANTERIOR CIRCULATION:

Visualized distal cervical segments of the internal carotid arteries
are patent with antegrade flow. Petrous, cavernous, and supraclinoid
segments patent without stenosis or other abnormality. A1 segments
widely patent. Normal anterior communicating artery complex.
Anterior cerebral arteries patent to their distal aspects without
stenosis.

Left M1 segment widely patent, with short-segment fenestration
proximally. Normal left MCA bifurcation. Distal left MCA branches
well perfused.

Right M1 segment patent proximally. Severe stenosis involving the
distal right M1 segment to the level of the bifurcation, stable from
previous (series [IM], image 12). Right MCA branches demonstrate
decreased perfusion but remain patent distally. Diffuse small vessel
atheromatous irregularity seen about the MCA branches bilaterally.

POSTERIOR CIRCULATION:

Both V4 segments patent to the vertebrobasilar junction without
stenosis. Left vertebral artery slightly dominant. Both PICA origins
patent and normal. Basilar patent to its distal aspect without
stenosis. Superior cerebellar arteries patent bilaterally. Left PCA
supplied via the basilar. Fetal type origin of the right PCA. Both
PCAs well perfused to their distal aspects without stenosis.

No intracranial aneurysm.

MRA NECK FINDINGS

AORTIC ARCH: Examination technically limited by timing of the
contrast bolus.

Partially visualized aortic arch grossly within normal limits for
caliber with normal 3 vessel morphology. No hemodynamically
significant stenosis seen about the origin of the great vessels.

RIGHT CAROTID SYSTEM: Visualized right CCA patent from its origin to
the bifurcation without stenosis. Mild atheromatous irregularity
about the right carotid bulb/proximal right ICA with associated
estimated stenosis of up to 30-40% stenosis by NASCET criteria.
Right ICA patent distally without stenosis, evidence for dissection
or occlusion.

LEFT CAROTID SYSTEM: Left CCA patent from its origin to the
bifurcation without stenosis. Mild atheromatous irregularity about
the left carotid bulb/proximal left ICA without hemodynamically
significant stenosis. Left ICA patent distally without stenosis,
evidence for dissection or occlusion.

VERTEBRAL ARTERIES: Both vertebral arteries arise from the
subclavian arteries. Left vertebral artery dominant. Origins of the
vertebral arteries not well evaluated on this technically limited
exam. Visualized portions of the vertebral arteries patent without
stenosis, evidence for dissection or occlusion.
IMPRESSION: MRI HEAD:

1. Patchy small volume acute ischemic infarcts involving the right
basal ganglia, right periatrial white matter, and overlying right
frontal and occipital cortices as above. Probable mild associated
petechial hemorrhage at the right occipital region. No frank
hemorrhagic transformation or mass effect.
2. Interval evolution of previously identified right basal ganglia
infarct, now largely chronic in appearance.
3. Additional chronic left frontal infarct, with a few additional
scattered remote bilateral cerebellar infarcts.
4. Superficial siderosis at the left parietal region, suggesting
prior subarachnoid hemorrhage, stable.

MRA HEAD:

1. Severe distal right M1 stenosis with diminished perfusion
distally, stable from previous.
2. No other proximal high-grade or correctable stenosis about the
major intracranial arterial circulation.
3. Diffuse small vessel atheromatous irregularity.

MRA NECK:

1. Mild atheromatous irregularity about the carotid
bifurcations/proximal ICAs bilaterally with estimated stenosis of up
to 30-40% on the right. No significant stenosis about the left
carotid artery system.
2. Widely patent vertebral arteries within the neck. Left vertebral
artery dominant.

## 2020-08-20 IMAGING — MR MR MRA NECK WO/W CM
4 of 6 series · 22 of 48 positions shown · IV contrast (Gadavist)
Comparison: Prior CT from [DATE] and MRI from [DATE].

CLINICAL DATA: Initial evaluation for acute left-sided weakness,
stroke.

EXAM:
MRI HEAD WITHOUT AND WITH CONTRAST
MRA HEAD WITHOUT CONTRAST
MRA NECK WITHOUT AND WITH CONTRAST
TECHNIQUE: Multiplanar, multi-echo pulse sequences of the brain and surrounding
structures were acquired without intravenous contrast. Angiographic
images of the Circle of Willis were acquired using MRA technique
without intravenous contrast. Angiographic images of the neck were
acquired using MRA technique without and with intravenous contrast.
Carotid stenosis measurements (when applicable) are obtained
utilizing NASCET criteria, using the distal internal carotid
diameter as the denominator.
CONTRAST:  6.5mL GADAVIST GADOBUTROL 1 MMOL/ML IV SOLN

[Series 8: tof_fl3d_tra_iso · axial · B · 0.6mm · 0.52mm/px · z∈[-85,-6]mm · 8 of 133 slices shown]
[im 1/133]
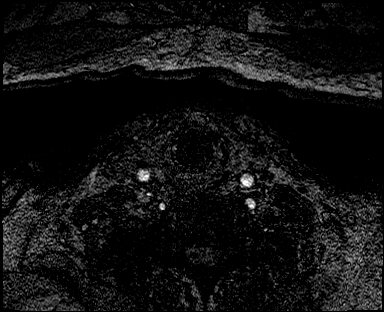
[im 21/133]
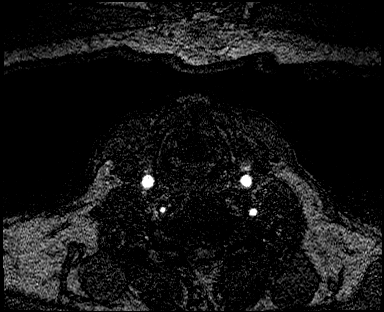
[im 41/133]
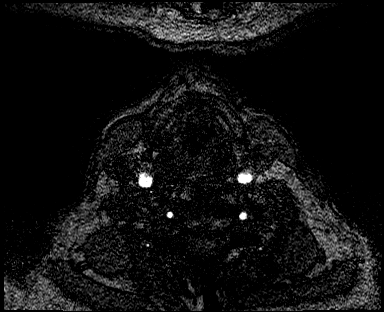
[im 61/133]
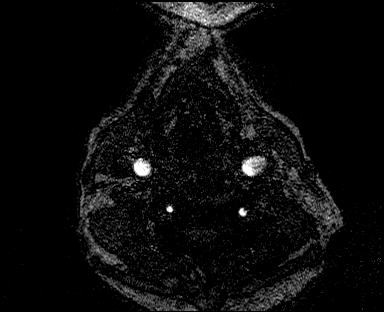
[im 72/133]
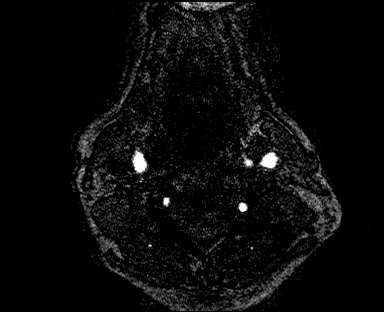
[im 92/133]
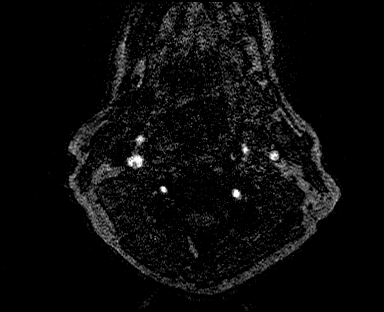
[im 112/133]
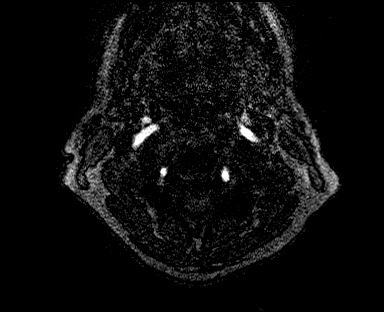
[im 133/133]
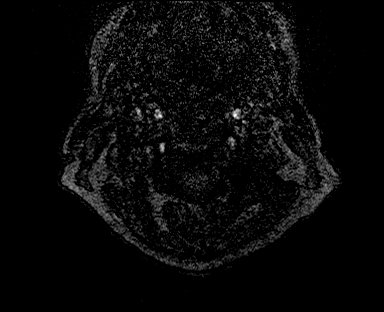

[Series 11: angio_fl3d_cor_highres_pre_ttc=3.0s · coronal · B · 0.9mm · 0.62mm/px · 8 of 80 slices shown]
[im 1/80]
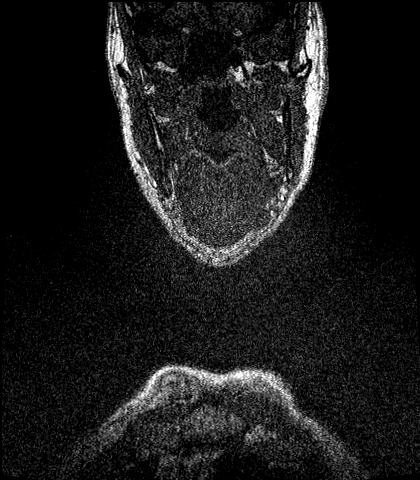
[im 12/80]
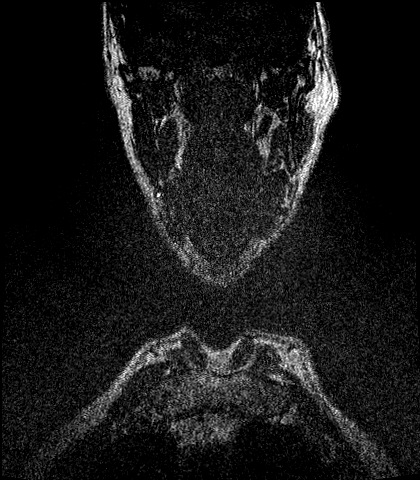
[im 23/80]
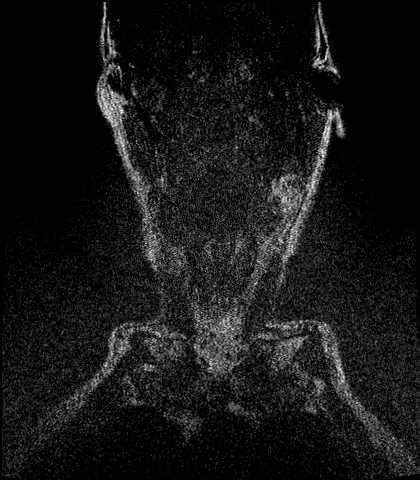
[im 34/80]
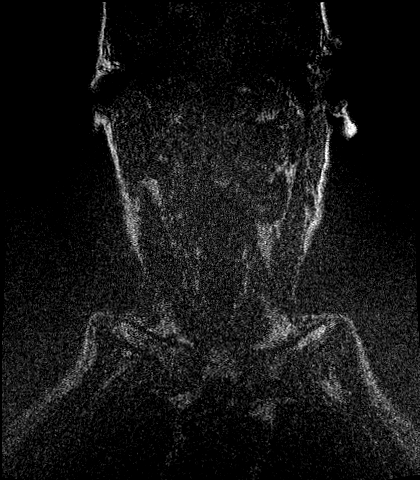
[im 46/80]
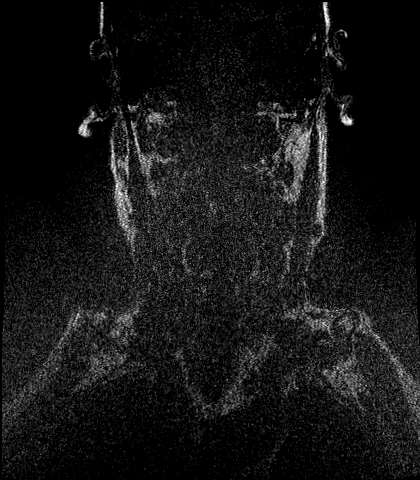
[im 57/80]
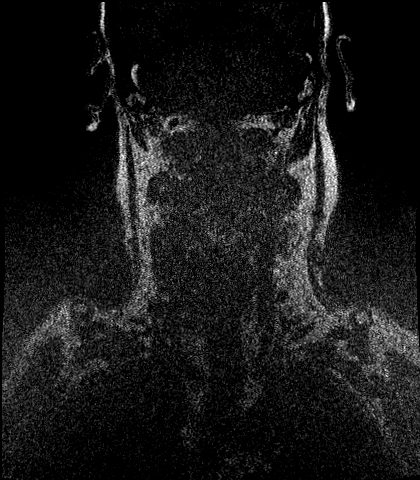
[im 68/80]
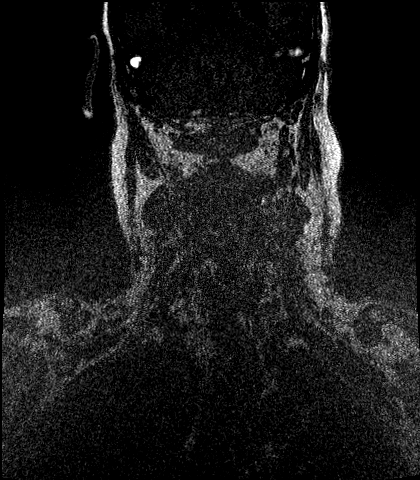
[im 80/80]
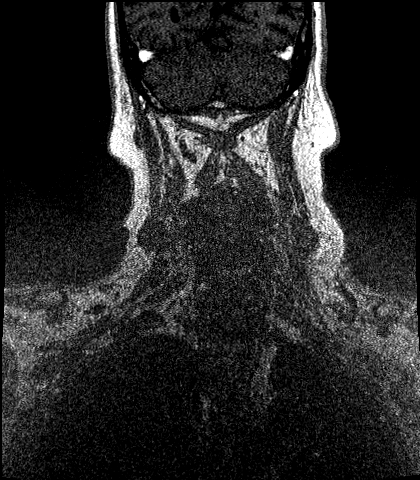

[Series 17: angio_fl3d_cor_highres_post_ttc=3.0s · coronal · B · 0.9mm · 0.62mm/px · 3 of 80 slices shown]
[im 12/80]
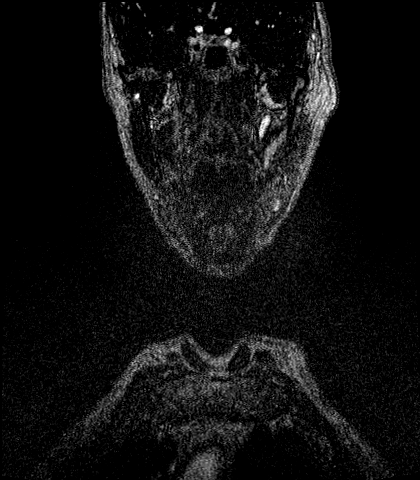
[im 46/80]
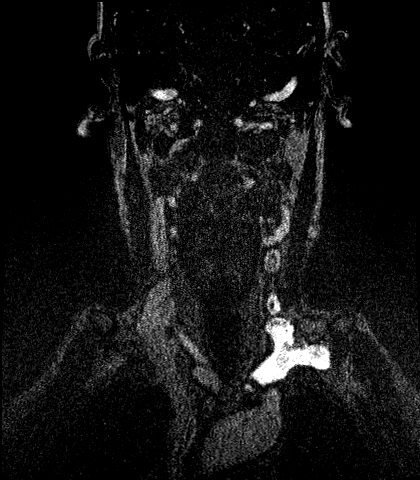
[im 68/80]
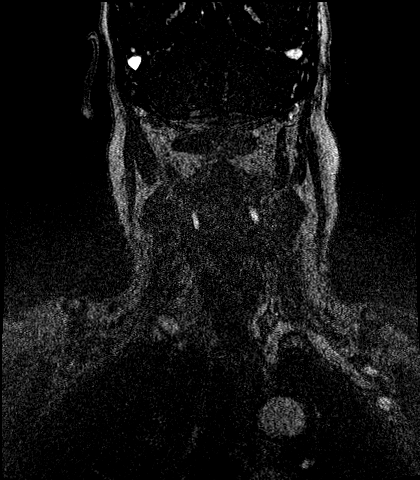

[Series 18: angio_fl3d_cor_highres_post_ttc=3.0s_moco-adv · coronal · B · 0.9mm · 0.62mm/px · 3 of 80 slices shown]
[im 12/80]
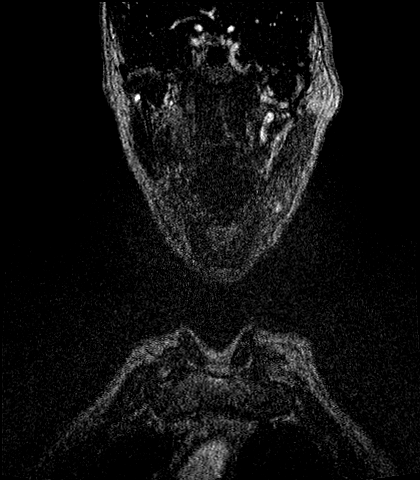
[im 46/80]
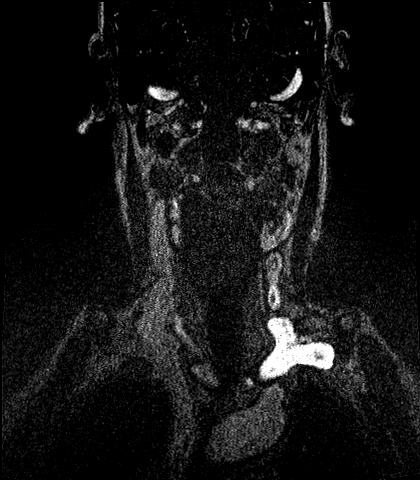
[im 68/80]
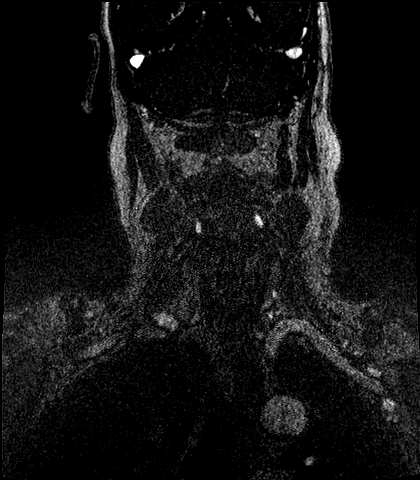

[22 of 48 positions shown; findings below may reference images not displayed]

FINDINGS: MRI HEAD FINDINGS

Brain: Diffuse prominence of the CSF containing spaces compatible
with generalized cerebral atrophy. Encephalomalacia and gliosis
involving the cortical and subcortical aspect of the anterior left
frontal lobe consistent with a chronic ischemic infarct, stable.
There has been interval evolution of previously identified infarct
at the right basal ganglia, now largely chronic in appearance.
Associated susceptibility artifact now seen at this location,
consistent with petechial hemorrhage. Intrinsic T1 hyperintensity
likely reflects associated mineralization. Few scattered remote
bilateral cerebellar infarcts noted as well.

Patchy foci of restricted diffusion are seen involving the posterior
right basal ganglia, immediately adjacent to the area prior
infarction (series 5, image 79). Mild extension into the adjacent
right external capsule. Additional patchy involvement of the right
periatrial white matter (series 5, image 75) as well as the right
occipital cortex (series 5, image 74). No significant mass effect.
Scattered foci of susceptibility artifact at the right temporal
occipital region is new from previous, and could reflect associated
petechial hemorrhage (series 18, image 27). Subtle patchy cortical
involvement of the right frontal operculum noted as well (a series
5, image 77).

No other evidence for acute or subacute ischemia. Gray-white matter
differentiation otherwise maintained. No other acute intracranial
hemorrhage. Chronic superficial siderosis along the cortical sulci
at the left parietal lobe noted (series 18, image 45), stable from
previous, and suggesting prior subarachnoid hemorrhage at this
location.

No mass lesion, midline shift or mass effect. No hydrocephalus or
extra-axial fluid collection. Pituitary gland suprasellar region
normal. Midline structures intact.

No abnormal enhancement.

Pituitary gland and suprasellar region are normal. Midline
structures intact and normal.

Vascular: Major intracranial vascular flow voids are maintained.

Skull and upper cervical spine: Craniocervical junction within
normal limits. Bone marrow signal intensity normal. No focal marrow
replacing lesion. No scalp soft tissue abnormality.

Sinuses/Orbits: Patient status post bilateral ocular lens
replacement. Scattered mucosal thickening noted within the ethmoidal
air cells. Paranasal sinuses are otherwise clear. No significant
mastoid effusion. Inner ear structures grossly normal.

Other: None.

MRA HEAD FINDINGS

ANTERIOR CIRCULATION:

Visualized distal cervical segments of the internal carotid arteries
are patent with antegrade flow. Petrous, cavernous, and supraclinoid
segments patent without stenosis or other abnormality. A1 segments
widely patent. Normal anterior communicating artery complex.
Anterior cerebral arteries patent to their distal aspects without
stenosis.

Left M1 segment widely patent, with short-segment fenestration
proximally. Normal left MCA bifurcation. Distal left MCA branches
well perfused.

Right M1 segment patent proximally. Severe stenosis involving the
distal right M1 segment to the level of the bifurcation, stable from
previous (series [IM], image 12). Right MCA branches demonstrate
decreased perfusion but remain patent distally. Diffuse small vessel
atheromatous irregularity seen about the MCA branches bilaterally.

POSTERIOR CIRCULATION:

Both V4 segments patent to the vertebrobasilar junction without
stenosis. Left vertebral artery slightly dominant. Both PICA origins
patent and normal. Basilar patent to its distal aspect without
stenosis. Superior cerebellar arteries patent bilaterally. Left PCA
supplied via the basilar. Fetal type origin of the right PCA. Both
PCAs well perfused to their distal aspects without stenosis.

No intracranial aneurysm.

MRA NECK FINDINGS

AORTIC ARCH: Examination technically limited by timing of the
contrast bolus.

Partially visualized aortic arch grossly within normal limits for
caliber with normal 3 vessel morphology. No hemodynamically
significant stenosis seen about the origin of the great vessels.

RIGHT CAROTID SYSTEM: Visualized right CCA patent from its origin to
the bifurcation without stenosis. Mild atheromatous irregularity
about the right carotid bulb/proximal right ICA with associated
estimated stenosis of up to 30-40% stenosis by NASCET criteria.
Right ICA patent distally without stenosis, evidence for dissection
or occlusion.

LEFT CAROTID SYSTEM: Left CCA patent from its origin to the
bifurcation without stenosis. Mild atheromatous irregularity about
the left carotid bulb/proximal left ICA without hemodynamically
significant stenosis. Left ICA patent distally without stenosis,
evidence for dissection or occlusion.

VERTEBRAL ARTERIES: Both vertebral arteries arise from the
subclavian arteries. Left vertebral artery dominant. Origins of the
vertebral arteries not well evaluated on this technically limited
exam. Visualized portions of the vertebral arteries patent without
stenosis, evidence for dissection or occlusion.
IMPRESSION: MRI HEAD:

1. Patchy small volume acute ischemic infarcts involving the right
basal ganglia, right periatrial white matter, and overlying right
frontal and occipital cortices as above. Probable mild associated
petechial hemorrhage at the right occipital region. No frank
hemorrhagic transformation or mass effect.
2. Interval evolution of previously identified right basal ganglia
infarct, now largely chronic in appearance.
3. Additional chronic left frontal infarct, with a few additional
scattered remote bilateral cerebellar infarcts.
4. Superficial siderosis at the left parietal region, suggesting
prior subarachnoid hemorrhage, stable.

MRA HEAD:

1. Severe distal right M1 stenosis with diminished perfusion
distally, stable from previous.
2. No other proximal high-grade or correctable stenosis about the
major intracranial arterial circulation.
3. Diffuse small vessel atheromatous irregularity.

MRA NECK:

1. Mild atheromatous irregularity about the carotid
bifurcations/proximal ICAs bilaterally with estimated stenosis of up
to 30-40% on the right. No significant stenosis about the left
carotid artery system.
2. Widely patent vertebral arteries within the neck. Left vertebral
artery dominant.

## 2020-08-20 MED ORDER — FOLIC ACID 1 MG PO TABS
1.0000 mg | ORAL_TABLET | Freq: Every day | ORAL | Status: DC
  Administered 2020-08-20 – 2020-08-27 (×8): 1 mg via ORAL
  Filled 2020-08-20 (×8): qty 1

## 2020-08-20 MED ORDER — ACETAMINOPHEN 160 MG/5ML PO SOLN: 650.0000 mg | ORAL | Status: DC | PRN

## 2020-08-20 MED ORDER — OXYCODONE-ACETAMINOPHEN 5-325 MG PO TABS
1.0000 | ORAL_TABLET | Freq: Three times a day (TID) | ORAL | Status: DC | PRN
Start: 2020-08-20 — End: 2020-08-28
  Administered 2020-08-20 – 2020-08-27 (×16): 1 via ORAL
  Filled 2020-08-20 (×17): qty 1

## 2020-08-20 MED ORDER — LORAZEPAM 2 MG/ML IJ SOLN: 1.0000 mg | INTRAMUSCULAR | Status: DC | PRN

## 2020-08-20 MED ORDER — SODIUM CHLORIDE 0.9% FLUSH
3.0000 mL | Freq: Once | INTRAVENOUS | Status: AC
  Administered 2020-08-20: 3 mL via INTRAVENOUS

## 2020-08-20 MED ORDER — ALPRAZOLAM 0.5 MG PO TABS
0.5000 mg | ORAL_TABLET | Freq: Three times a day (TID) | ORAL | Status: DC
  Administered 2020-08-20 – 2020-08-30 (×29): 0.5 mg via ORAL
  Filled 2020-08-20 (×30): qty 1

## 2020-08-20 MED ORDER — ADULT MULTIVITAMIN W/MINERALS CH
1.0000 | ORAL_TABLET | Freq: Every day | ORAL | Status: DC
  Administered 2020-08-20 – 2020-08-27 (×8): 1 via ORAL
  Filled 2020-08-20 (×8): qty 1

## 2020-08-20 MED ORDER — ACETAMINOPHEN 650 MG RE SUPP: 650.0000 mg | RECTAL | Status: DC | PRN

## 2020-08-20 MED ORDER — ENOXAPARIN SODIUM 40 MG/0.4ML IJ SOSY
40.0000 mg | PREFILLED_SYRINGE | INTRAMUSCULAR | Status: DC
  Administered 2020-08-20 – 2020-08-23 (×4): 40 mg via SUBCUTANEOUS
  Filled 2020-08-20 (×4): qty 0.4

## 2020-08-20 MED ORDER — ASPIRIN 81 MG PO CHEW
81.0000 mg | CHEWABLE_TABLET | Freq: Every day | ORAL | Status: DC
  Administered 2020-08-20 – 2020-08-21 (×2): 81 mg via ORAL
  Filled 2020-08-20 (×2): qty 1

## 2020-08-20 MED ORDER — LORAZEPAM 1 MG PO TABS: 1.0000 mg | ORAL_TABLET | ORAL | Status: DC | PRN

## 2020-08-20 MED ORDER — ACETAMINOPHEN 325 MG PO TABS
650.0000 mg | ORAL_TABLET | ORAL | Status: DC | PRN
Start: 2020-08-20 — End: 2020-08-31
  Administered 2020-08-22 – 2020-08-27 (×5): 650 mg via ORAL
  Filled 2020-08-20 (×5): qty 2

## 2020-08-20 MED ORDER — THIAMINE HCL 100 MG PO TABS
100.0000 mg | ORAL_TABLET | Freq: Every day | ORAL | Status: DC
  Administered 2020-08-21 – 2020-08-27 (×7): 100 mg via ORAL
  Filled 2020-08-20 (×7): qty 1

## 2020-08-20 MED ORDER — ATORVASTATIN CALCIUM 40 MG PO TABS
40.0000 mg | ORAL_TABLET | Freq: Every day | ORAL | Status: DC
  Administered 2020-08-20 – 2020-08-28 (×9): 40 mg via ORAL
  Filled 2020-08-20 (×9): qty 1

## 2020-08-20 MED ORDER — STROKE: EARLY STAGES OF RECOVERY BOOK
Freq: Once | Status: AC
  Filled 2020-08-20: qty 1

## 2020-08-20 MED ORDER — THIAMINE HCL 100 MG/ML IJ SOLN
100.0000 mg | Freq: Every day | INTRAMUSCULAR | Status: DC
  Administered 2020-08-20: 100 mg via INTRAVENOUS
  Filled 2020-08-20 (×3): qty 2

## 2020-08-20 MED FILL — Dexamethasone Sodium Phosphate Inj 100 MG/10ML: INTRAMUSCULAR | Qty: 2 | Status: AC

## 2020-08-20 NOTE — ED Provider Notes (Addendum)
[place] EMERGENCY DEPARTMENT Provider Note   CSN: [id number] Arrival date & time:     An emergency department physician performed an initial assessment on this suspected stroke patient at 17.  History Chief Complaint  Patient presents with   L side weakness   Code Stroke    Cody Douglas is a 70 y.o. male.  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 Patient with left arm weakness, and facial droop/dysarthria - symptoms initially began yesterday w LKW ~ 30 hrs ago. Symptoms acute onset, moderate, persistent, worse this AM. Denies headache. No change in vision. Denies trauma/fall. No syncope. No anticoag use.  The history is provided by the patient and the EMS personnel.      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   Thrush 08/03/2020   Encounter for antineoplastic chemotherapy 07/21/2020   Endobronchial mass    Malignant neoplasm of upper lobe of right lung ([place]) 07/06/2020   Acute ischemic stroke ([place]) 07/01/2020   Acute CVA (cerebrovascular accident) ([place]) 06/30/2020   Essential hypertension 06/30/2020   Alcohol use 06/30/2020   Leukocytosis 06/30/2020   Hypertrophic cardiomyopathy ([place]) 12/14/2013   Abnormal ECG 12/13/2013   CAP (community acquired pneumonia) 12/13/2013   Chest pain 12/12/2013   Hyponatremia 12/12/2013   Hypokalemia 12/12/2013   Tobacco abuse 12/12/2013    Past Surgical History:  Procedure Laterality Date   BRONCHIAL BRUSHINGS  07/09/2020   Procedure: BRONCHIAL BRUSHINGS;  Surgeon: Garner Nash, DO;  Location: [place];  Service: Pulmonary;;   BRONCHIAL WASHINGS   07/09/2020   Procedure: BRONCHIAL WASHINGS;  Surgeon: Garner Nash, DO;  Location: [place] [facility];  Service: Pulmonary;;   CRYOTHERAPY  07/09/2020   Procedure: CRYOTHERAPY;  Surgeon: Garner Nash, DO;  Location: [place] [facility];  Service: Pulmonary;;   EYE SURGERY     HEMOSTASIS CONTROL  07/09/2020   Procedure: HEMOSTASIS CONTROL;  Surgeon: Garner Nash, DO;  Location: [place] [facility];  Service: Pulmonary;;   PROSTATE BIOPSY     Prostate surgery 8-9 yrs ago   TEE WITHOUT CARDIOVERSION N/A 07/12/2020   Procedure: TRANSESOPHAGEAL ECHOCARDIOGRAM (TEE);  Surgeon: Lelon Perla, MD;  Location: [facility] ENDOSCOPY;  Service: Cardiovascular;  Laterality: N/A;   VIDEO BRONCHOSCOPY WITH ENDOBRONCHIAL ULTRASOUND Bilateral 07/09/2020   Procedure: VIDEO BRONCHOSCOPY WITH ENDOBRONCHIAL ULTRASOUND;  Surgeon: Garner Nash, DO;  Location: [place];  Service: Pulmonary;  Laterality: Bilateral;       Family History  Problem Relation Age of Onset   Cancer - Lung Father    Heart disease Brother    Diabetes type II Neg Hx    Stroke Neg Hx     Social History   Tobacco Use   Smoking status: Every Day    Packs/day: 2.50    Years: 40.00    Pack years: 100.00    Types: Cigarettes   Smokeless tobacco: Never  Vaping Use   Vaping Use: Never used  Substance Use Topics   Alcohol use: Yes    Alcohol/week: 1.0 standard drink  Types: 1 Cans of beer per week    Comment: couple of days a week   Drug use: No    Home Medications Prior to Admission medications   Medication Sig Start Date End Date Taking? Authorizing Provider  albuterol (PROVENTIL HFA;VENTOLIN HFA) 108 (90 BASE) MCG/ACT inhaler Inhale 2 puffs into the lungs every 6 (six) hours as needed for wheezing or shortness of breath. 12/14/13   Ghimire, Henreitta Leber, MD  ALPRAZolam Duanne Moron) 0.5 MG tablet Take 1 tablet (0.5 mg total) by mouth 3 (three) times daily. LF on 06-25-20 # 90 DS 30 07/15/20   Hongalgi, Lenis Dickinson, MD  aspirin 81 MG chewable tablet  Chew 1 tablet (81 mg total) by mouth daily. 12/14/13   Ghimire, Henreitta Leber, MD  atorvastatin (LIPITOR) 40 MG tablet Take 1 tablet (40 mg total) by mouth daily. 07/16/20   Hongalgi, Lenis Dickinson, MD  cyclobenzaprine (FLEXERIL) 5 MG tablet Take 5 mg by mouth 3 (three) times daily as needed. 07/15/20   [provider]  fluconazole (DIFLUCAN) 100 MG tablet Take 1 tablet (100 mg total) by mouth daily. Patient not taking: Reported on 08/10/2020 08/03/20   Heilingoetter, Cassandra L, PA-C  folic acid (FOLVITE) 1 MG tablet Take 1 tablet (1 mg total) by mouth daily. 07/16/20   Hongalgi, Lenis Dickinson, MD  metoprolol succinate (TOPROL-XL) 25 MG 24 hr tablet Take 25 mg by mouth daily. 04/29/20   [provider]  Multiple Vitamins-Minerals (MULTIVITAMIN WITH MINERALS) tablet Take 1 tablet by mouth daily.    [provider]  omeprazole (PRILOSEC) 20 MG capsule Take 20 mg by mouth daily.    [provider]  oxyCODONE-acetaminophen (PERCOCET) 10-325 MG tablet Take 1 tablet by mouth every 8 (eight) hours as needed for pain. 07/15/20   Hongalgi, Lenis Dickinson, MD  polyethylene glycol (MIRALAX / GLYCOLAX) 17 g packet Take 17 g by mouth daily.    [provider]  prochlorperazine (COMPAZINE) 10 MG tablet Take 1 tablet (10 mg total) by mouth every 6 (six) hours as needed for nausea or vomiting. 07/21/20   Curt Bears, MD  thiamine 100 MG tablet Take 1 tablet (100 mg total) by mouth daily. 07/16/20   Hongalgi, Lenis Dickinson, MD    Allergies    Patient has no known allergies.  Review of Systems   Review of Systems  Constitutional:  Negative for fever.  HENT:  Negative for trouble swallowing.   Eyes:  Negative for visual disturbance.  Respiratory:  Negative for shortness of breath.   Cardiovascular:  Negative for chest pain.  Gastrointestinal:  Negative for abdominal pain.  Genitourinary:  Negative for flank pain.  Musculoskeletal:  Negative for back pain and neck pain.  Skin:  Negative for rash.   Neurological:  Positive for speech difficulty and weakness. Negative for headaches.  Hematological:  Does not bruise/bleed easily.  Psychiatric/Behavioral:  Negative for confusion.    Physical Exam Updated Vital Signs BP 113/76 (BP Location: Left Arm)   Pulse 82   Resp 19   Ht 1.803 m (5\' 11" )   Wt 64.5 kg   SpO2 95%   BMI 19.82 kg/m   Physical Exam Vitals and nursing note reviewed.  Constitutional:      Appearance: Normal appearance. He is well-developed.  HENT:     Head: Atraumatic.     Nose: Nose normal.     Mouth/Throat:     Mouth: Mucous membranes are moist.     Pharynx: Oropharynx is clear.  Eyes:     General: No scleral icterus.    Conjunctiva/sclera: Conjunctivae normal.     Pupils: Pupils are equal, round, and reactive to light.  Neck:     Vascular: No carotid bruit.     Trachea: No tracheal deviation.     Comments: C spine non tender.  Cardiovascular:     Rate and Rhythm: Normal rate and regular rhythm.     Pulses: Normal pulses.     Heart sounds: Normal heart sounds. No murmur heard.   No friction rub. No gallop.  Pulmonary:     Effort: Pulmonary effort is normal. No accessory muscle usage or respiratory distress.     Breath sounds: Normal breath sounds.  Abdominal:     General: Bowel sounds are normal. There is no distension.     Palpations: Abdomen is soft.     Tenderness: There is no abdominal tenderness. There is no guarding.  Genitourinary:    Comments: No cva tenderness. Musculoskeletal:        General: No swelling.     Cervical back: Normal range of motion and neck supple. No rigidity or tenderness.  Skin:    General: Skin is warm and dry.     Findings: No rash.  Neurological:     Mental Status: He is alert.     Comments: Alert, speech clear. LUE weakness, strength 1+/5. ?slight dysarthria.   Psychiatric:        Mood and Affect: Mood normal.    ED Results / Procedures / Treatments   Labs (all labs ordered are listed, but only abnormal  results are displayed) Results for orders placed or performed during the hospital encounter of 08/20/20  Protime-INR  Result Value Ref Range   Prothrombin Time 13.2 11.4 - 15.2 seconds   INR 1.0 0.8 - 1.2  APTT  Result Value Ref Range   aPTT 30 24 - 36 seconds  CBC  Result Value Ref Range   WBC 6.6 4.0 - 10.5 K/uL   RBC 3.97 (L) 4.22 - 5.81 MIL/uL   Hemoglobin 11.5 (L) 13.0 - 17.0 g/dL   HCT 36.8 (L) 39.0 - 52.0 %   MCV 92.7 80.0 - 100.0 fL   MCH 29.0 26.0 - 34.0 pg   MCHC 31.3 30.0 - 36.0 g/dL   RDW 14.1 11.5 - 15.5 %   Platelets 316 150 - 400 K/uL   nRBC 0.0 0.0 - 0.2 %  Differential  Result Value Ref Range   Neutrophils Relative % 82 %   Neutro Abs 5.4 1.7 - 7.7 K/uL   Lymphocytes Relative 10 %   Lymphs Abs 0.7 0.7 - 4.0 K/uL   Monocytes Relative 6 %   Monocytes Absolute 0.4 0.1 - 1.0 K/uL   Eosinophils Relative 1 %   Eosinophils Absolute 0.0 0.0 - 0.5 K/uL   Basophils Relative 0 %   Basophils Absolute 0.0 0.0 - 0.1 K/uL   Immature Granulocytes 1 %   Abs Immature Granulocytes 0.03 0.00 - 0.07 K/uL  Comprehensive metabolic panel  Result Value Ref Range   Sodium 138 135 - 145 mmol/L   Potassium 3.8 3.5 - 5.1 mmol/L   Chloride 101 98 - 111 mmol/L   CO2 25 22 - 32 mmol/L   Glucose, Bld 118 (H) 70 - 99 mg/dL   BUN 9 8 - 23 mg/dL   Creatinine, Ser 0.51 (L) 0.61 - 1.24 mg/dL   Calcium 9.1 8.9 - 10.3 mg/dL   Total Protein 6.5 6.5 -  8.1 g/dL   Albumin 3.3 (L) 3.5 - 5.0 g/dL   AST 18 15 - 41 U/L   ALT 15 0 - 44 U/L   Alkaline Phosphatase 93 38 - 126 U/L   Total Bilirubin 0.8 0.3 - 1.2 mg/dL   GFR, Estimated >60 >60 mL/min   Anion gap 12 5 - 15  I-stat chem 8, ED  Result Value Ref Range   Sodium 140 135 - 145 mmol/L   Potassium 3.6 3.5 - 5.1 mmol/L   Chloride 103 98 - 111 mmol/L   BUN 11 8 - 23 mg/dL   Creatinine, Ser 0.40 (L) 0.61 - 1.24 mg/dL   Glucose, Bld 109 (H) 70 - 99 mg/dL   Calcium, Ion 1.10 (L) 1.15 - 1.40 mmol/L   TCO2 24 22 - 32 mmol/L   Hemoglobin  13.9 13.0 - 17.0 g/dL   HCT 41.0 39.0 - 52.0 %  CBG monitoring, ED  Result Value Ref Range   Glucose-Capillary 106 (H) 70 - 99 mg/dL   NM PET Image Initial (PI) Skull Base To Thigh  Result Date: 07/27/2020 CLINICAL DATA:  Initial treatment strategy for 70 year old male with history of non-small cell lung cancer, squamous cell carcinoma also with history of prostate cancer. History of bronchoscopy with biopsy and cryotherapy on 07/09/2020. EXAM: NUCLEAR MEDICINE PET SKULL BASE TO THIGH TECHNIQUE: 7.26 mCi F-18 FDG was injected intravenously. Full-ring PET imaging was performed from the skull base to thigh after the radiotracer. CT data was obtained and used for attenuation correction and anatomic localization. Fasting blood glucose: 116 mg/dl COMPARISON:  Chest abdomen pelvis CT 07/05/2020. FINDINGS: Mediastinal blood pool activity: SUV max 1.85 Liver activity: SUV max NA NECK: No hypermetabolic lymph nodes in the neck. Incidental CT findings: Signs of prior RIGHT MCA infarct incompletely evaluated. CHEST: RIGHT hilar mass as on the recent CT of the chest with associated volume loss in the RIGHT middle lobe and RIGHT upper lobe. Mass at the level of the hilum measuring approximately 4.3 x 1.5 cm showing intense FDG uptake. Size not substantially changed compared to recent CT evaluation. Maximum SUV on the current exam of 14.2. RIGHT paratracheal lymph node measuring 0.9 cm on image 68 of series 4 with intense FDG uptake. Maximum SUV 13.8. (Image 70/3) Nodularity and septal thickening at the RIGHT lung apex is similar to the prior exam. This displays mildly increased metabolic activity with a maximum SUV of 2.8 (image 68/3) no effusion. Nodular areas along the pleura, largest in the RIGHT upper lobe is contiguous with bandlike changes but measures 1 x 0.6 cm also showing low level FDG uptake as does ground-glass surrounding bandlike changes that extend into the RIGHT upper lobe with similar uptake to the  dominant area measuring 3.3 x 1.3 cm on image 68 of series 4. FDG uptake is outlined above a 2.88. Incidental CT findings: Calcified atheromatous plaque in the thoracic aorta without aneurysmal dilation. Normal caliber central pulmonary vasculature. Signs of coronary artery disease as before. Normal heart size without substantial pericardial effusion. ABDOMEN/PELVIS: No signs of abnormal uptake in solid organs or nodal pathways in the abdomen or pelvis to indicate metastatic disease. Incidental CT findings: No acute findings relative to liver, gallbladder, pancreas, spleen, adrenal glands and kidneys. Urinary bladder is decompressed. No acute findings relative to bowel.  Colonic diverticulosis. 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 Mild stranding about the vas deferens in the RIGHT hemipelvis with  expansion of the vas deferens when compared to the previous CT of the abdomen and pelvis from April of 2022. There is intense metabolic activity adjacent to the vast difference favored to represent the RIGHT ureter. However, slightly away from this there is mildly increased metabolic activity in this location. No significant FDG uptake in the area of the prostate. SKELETON: No focal hypermetabolic activity to suggest skeletal metastasis. Incidental CT findings: Spinal degenerative changes. IMPRESSION: Signs of large RIGHT hilar mass with intense metabolic activity and associated RIGHT paratracheal adenopathy. Irregular RIGHT hilar mass also with areas of septal thickening and nodularity in the RIGHT upper lobe raising the question of lymphangitic carcinomatosis. FDG uptake in this area is lower level. Morphologic features remain suspicious. An overlay of postobstructive pneumonitis is also considered. Above mass extends into mediastinal fat from the RIGHT hilum and continues to show volume loss in the adjacent RIGHT upper  lobe. No additional signs of metastatic disease. Stranding around the RIGHT vas deferens (image 181/4 with low level metabolic activity difficult to separate from adjacent ureter in terms of metabolic activity. Findings represent a change from the recent study inter favored to represent an inflammatory process. Correlate with any pelvic symptoms and consider interval follow-up contrasted imaging given the patient's history of prostate neoplasm. 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 Recommend follow-up every 3 years. This recommendation follows ACR consensus guidelines: White Paper of the ACR Incidental Findings Committee II on Vascular Findings. J Am Coll Radiol 2013; 10:789-794. Aortic Atherosclerosis (ICD10-I70.0). Electronically Signed   By: Zetta Bills M.D.   On: 07/27/2020 11:53   CT HEAD CODE STROKE WO CONTRAST  Result Date: 08/20/2020 CLINICAL DATA:  Code stroke.  Weakness and facial droop EXAM: CT HEAD WITHOUT CONTRAST TECHNIQUE: Contiguous axial images were obtained from the base of the skull through the vertex without intravenous contrast. COMPARISON:  MRI 06/30/2020 FINDINGS: Brain: There is no acute intracranial hemorrhage, mass effect, or edema. No acute appearing loss of gray-white differentiation. Infarction of the right corona radiata and basal ganglia seen on prior MRI is identified. There is a chronic left frontal infarction involving the operculum. There may be a chronic cortical infarct of the posterior right opercular region. Additional patchy hypoattenuation in the supratentorial white matter is nonspecific but probably reflects chronic microvascular ischemic changes. Prominence of the ventricles and sulci reflects generalized parenchymal volume loss. No extra-axial collection. Vascular: No hyperdense vessel. There are foci of calcification along the right M1 and M2 MCA. Skull: Unremarkable. Sinuses/Orbits: No acute finding. Other: Mastoid air cells are clear. ASPECTS ([place] Stroke  Program Early CT Score) - Ganglionic level infarction (caudate, lentiform nuclei, internal capsule, insula, M1-M3 cortex): 7 - Supraganglionic infarction (M4-M6 cortex): 3 Total score (0-10 with 10 being normal): 10 IMPRESSION: There is no acute intracranial hemorrhage or evidence of acute infarction. ASPECT score is 10. Chronic infarcts as described. Foci of calcification along the right M1 and M2 MCA. Could reflect calcified emboli. Right M1 MCA irregularity on the prior MRA is likely related. These results were communicated to Dr. Lorrin Goodell at 7:18 pm on 08/20/2020 by text page via the [facility] messaging system. Electronically Signed   By: Macy Mis M.D.   On: 08/20/2020 19:22    ED ECG REPORT   Date: 08/20/2020  Rate: 83  Rhythm: normal sinus rhythm  QRS Axis: normal  Intervals: normal  ST/T Wave abnormalities: nonspecific ST/T changes  Conduction Disutrbances:right bundle branch block  Narrative Interpretation:   Old EKG Reviewed: changes noted  I have personally reviewed the EKG tracing    Radiology CT HEAD CODE STROKE WO CONTRAST  Result Date: 08/20/2020 CLINICAL DATA:  Code stroke.  Weakness and facial droop EXAM: CT HEAD WITHOUT CONTRAST TECHNIQUE: Contiguous axial images were obtained from the base of the skull through the vertex without intravenous contrast. COMPARISON:  MRI 06/30/2020 FINDINGS: Brain: There is no acute intracranial hemorrhage, mass effect, or edema. No acute appearing loss of gray-white differentiation. Infarction of the right corona radiata and basal ganglia seen on prior MRI is identified. There is a chronic left frontal infarction involving the operculum. There may be a chronic cortical infarct of the posterior right opercular region. Additional patchy hypoattenuation in the supratentorial white matter is nonspecific but probably reflects chronic microvascular ischemic changes. Prominence of the ventricles and sulci reflects generalized parenchymal volume loss.  No extra-axial collection. Vascular: No hyperdense vessel. There are foci of calcification along the right M1 and M2 MCA. Skull: Unremarkable. Sinuses/Orbits: No acute finding. Other: Mastoid air cells are clear. ASPECTS ([place] Stroke Program Early CT Score) - Ganglionic level infarction (caudate, lentiform nuclei, internal capsule, insula, M1-M3 cortex): 7 - Supraganglionic infarction (M4-M6 cortex): 3 Total score (0-10 with 10 being normal): 10 IMPRESSION: There is no acute intracranial hemorrhage or evidence of acute infarction. ASPECT score is 10. Chronic infarcts as described. Foci of calcification along the right M1 and M2 MCA. Could reflect calcified emboli. Right M1 MCA irregularity on the prior MRA is likely related. These results were communicated to Dr. Lorrin Goodell at 7:18 pm on 08/20/2020 by text page via the [facility] messaging system. Electronically Signed   By: Macy Mis M.D.   On: 08/20/2020 19:22    Procedures Procedures   Medications Ordered in ED Medications  oxyCODONE-acetaminophen (PERCOCET/ROXICET) 5-325 MG per tablet 1 tablet (has no administration in time range)  ALPRAZolam (XANAX) tablet 0.5 mg (has no administration in time range)  LORazepam (ATIVAN) tablet 1-4 mg (has no administration in time range)    Or  LORazepam (ATIVAN) injection 1-4 mg (has no administration in time range)  thiamine tablet 100 mg (has no administration in time range)    Or  thiamine (B-1) injection 100 mg (has no administration in time range)  folic acid (FOLVITE) tablet 1 mg (has no administration in time range)  multivitamin with minerals tablet 1 tablet (has no administration in time range)  aspirin chewable tablet 81 mg (has no administration in time range)  atorvastatin (LIPITOR) tablet 40 mg (has no administration in time range)   stroke: mapping our early stages of recovery book (has no administration in time range)  acetaminophen (TYLENOL) tablet 650 mg (has no administration in time  range)    Or  acetaminophen (TYLENOL) 160 MG/5ML solution 650 mg (has no administration in time range)    Or  acetaminophen (TYLENOL) suppository 650 mg (has no administration in time range)  enoxaparin (LOVENOX) injection 40 mg (has no administration in time range)  sodium chloride flush (NS) 0.9 % injection 3 mL (3 mLs Intravenous Given 08/20/20 2006)    ED Course  I have reviewed the triage vital signs and the nursing notes.  Pertinent labs & imaging results that were available during my care of the patient were reviewed by me and considered in my medical decision making (see chart for details).    MDM Rules/Calculators/A&P                         Iv  ns. Continuous pulse ox and cardiac monitoring. Stat ct. Labs. Ecg.   Reviewed nursing notes and prior charts for additional history.   Pt arrives as possible code stroke activation. Neurology indicates as last at baseline, known well, ~ 30 hrs ago, not candidate for tpa, and recommends admit to medicine for cva workup.   CT reviewed/interpreted by me - no hem.  Labs reviewed/interpreted by me - glucose normal.   Medicine service consulted for admission- unassigned medicine consulted for admission.   Neurochecks - no change in exam from prior earlier this evening.        Final Clinical Impression(s) / ED Diagnoses Final diagnoses:  Acute CVA (cerebrovascular accident) [facility])    Rx / [place] Orders ED Discharge Orders     None            Lajean Saver, MD 08/20/20 2049

## 2020-08-20 NOTE — Telephone Encounter (Addendum)
August 19, 2020, 2:30 pm  Treatment staff called to nursing clinic with concerns for patient being weaker today on his left side than he was the previous day.  Patient was evaluated in the dressing room and was noted to have left side weakness.  Patient with recent history of CVA with left sided deficits.  He is currently residing at [facility].  A call was placed to his nurse at the facility and she voiced that PT made them aware of his left side weakness and they will get their physician to evaluate him.  I let them know that the provider here would like for them to also reach out to his neurologist to make them aware of the changes he is experiencing.  She verbalized understanding.  Gloriajean Dell. Leonie Green, BSN

## 2020-08-20 NOTE — H&P (Signed)
History and Physical    Cody Douglas QIH:[id number] DOB: 02/07/1951 DOA: 08/20/2020  PCP: Tamsen Roers, MD  Patient coming from: Home  I have personally briefly reviewed patient's old medical records in [place]  Chief Complaint: L sided weakness  HPI: Cody Douglas is a 70 y.o. male with medical history significant of HTN, prostate CA, smoking and EtOH abuse.  Pt had ischemic stroke in April 2022, during that admission pt also found to have NSCLC stage 3, ? Of MV vegetation, BCx were negative so ultimately ? 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  Pt initially put on ASA + Plavix however developed hemoptysis.  So this was held then just ASA 325 restarted on 5/2 resulting in just hematuria.  Pt ultimately discharged on ASA 81.  He has spent past 3 weeks in rehab.  Pt presents to ED with c/o L arm weakness and L facial droop / dyarthria.  These similar to prior stroke symptoms but worse.  Onset was yesterday, acute.  Symptoms are moderate, and persistent.  Somewhat worse this AM.  LKW ~30h ago.  No syncope.  No trauma nor fall.   ED Course: CT head = foci of calcification along R M1 and M2 MCA, could reflect calcificed emboli.   Review of Systems: As per HPI, otherwise all review of systems negative.  Past Medical History:  Diagnosis Date   Anxiety    Dyspnea    with exertion - has inhaler   Frequent headaches    GERD (gastroesophageal reflux disease)    Hypertension    Prostate cancer ([place])    Stroke [facility])    Patient reports having a stroke on 4/13    Past Surgical History:  Procedure Laterality Date   BRONCHIAL BRUSHINGS  07/09/2020   Procedure: BRONCHIAL BRUSHINGS;  Surgeon: Garner Nash, DO;  Location: [place] ENDOSCOPY;  Service: Pulmonary;;   BRONCHIAL WASHINGS  07/09/2020   Procedure: BRONCHIAL WASHINGS;  Surgeon: Garner Nash, DO;  Location: [place] [facility]COPY;  Service: Pulmonary;;   CRYOTHERAPY  07/09/2020   Procedure: CRYOTHERAPY;   Surgeon: Garner Nash, DO;  Location: [place] [facility];  Service: Pulmonary;;   EYE SURGERY     HEMOSTASIS CONTROL  07/09/2020   Procedure: HEMOSTASIS CONTROL;  Surgeon: Garner Nash, DO;  Location: [place] ENDOSCOPY;  Service: Pulmonary;;   PROSTATE BIOPSY     Prostate surgery 8-9 yrs ago   TEE WITHOUT CARDIOVERSION N/A 07/12/2020   Procedure: TRANSESOPHAGEAL ECHOCARDIOGRAM (TEE);  Surgeon: Lelon Perla, MD;  Location: [facility];  Service: Cardiovascular;  Laterality: N/A;   VIDEO BRONCHOSCOPY WITH ENDOBRONCHIAL ULTRASOUND Bilateral 07/09/2020   Procedure: VIDEO BRONCHOSCOPY WITH ENDOBRONCHIAL ULTRASOUND;  Surgeon: Garner Nash, DO;  Location: [place];  Service: Pulmonary;  Laterality: Bilateral;     reports that he has been smoking cigarettes. He has a 100.00 pack-year smoking history. He has never used smokeless tobacco. He reports current alcohol use of about 1.0 standard drink of alcohol per week. He reports that he does not use drugs.  No Known Allergies  Family History  Problem Relation Age of Onset   Cancer - Lung Father    Heart disease Brother    Diabetes type II Neg Hx    Stroke Neg Hx      Prior to Admission medications   Medication Sig Start Date End Date Taking? Authorizing Provider  albuterol (PROVENTIL HFA;VENTOLIN HFA) 108 (90 BASE) MCG/ACT inhaler Inhale 2 puffs into the lungs every 6 (  six) hours as needed for wheezing or shortness of breath. 12/14/13   Ghimire, Henreitta Leber, MD  ALPRAZolam Duanne Moron) 0.5 MG tablet Take 1 tablet (0.5 mg total) by mouth 3 (three) times daily. LF on 06-25-20 # 90 DS 30 07/15/20   Hongalgi, Lenis Dickinson, MD  aspirin 81 MG chewable tablet Chew 1 tablet (81 mg total) by mouth daily. 12/14/13   Ghimire, Henreitta Leber, MD  atorvastatin (LIPITOR) 40 MG tablet Take 1 tablet (40 mg total) by mouth daily. 07/16/20   Hongalgi, Lenis Dickinson, MD  cyclobenzaprine (FLEXERIL) 5 MG tablet Take 5 mg by mouth 3 (three) times daily as needed. 07/15/20   [provider]  fluconazole (DIFLUCAN) 100 MG tablet Take 1 tablet (100 mg total) by mouth daily. Patient not taking: Reported on 08/10/2020 08/03/20   Heilingoetter, Cassandra L, PA-C  folic acid (FOLVITE) 1 MG tablet Take 1 tablet (1 mg total) by mouth daily. 07/16/20   Hongalgi, Lenis Dickinson, MD  metoprolol succinate (TOPROL-XL) 25 MG 24 hr tablet Take 25 mg by mouth daily. 04/29/20   [provider]  Multiple Vitamins-Minerals (MULTIVITAMIN WITH MINERALS) tablet Take 1 tablet by mouth daily.    [provider]  omeprazole (PRILOSEC) 20 MG capsule Take 20 mg by mouth daily.    [provider]  oxyCODONE-acetaminophen (PERCOCET) 10-325 MG tablet Take 1 tablet by mouth every 8 (eight) hours as needed for pain. 07/15/20   Hongalgi, Lenis Dickinson, MD  polyethylene glycol (MIRALAX / GLYCOLAX) 17 g packet Take 17 g by mouth daily.    [provider]  prochlorperazine (COMPAZINE) 10 MG tablet Take 1 tablet (10 mg total) by mouth every 6 (six) hours as needed for nausea or vomiting. 07/21/20   Curt Bears, MD  thiamine 100 MG tablet Take 1 tablet (100 mg total) by mouth daily. 07/16/20   Modena Jansky, MD    Physical Exam: Vitals:   08/20/20 1859 08/20/20 1920 08/20/20 1951  BP: 113/76    Pulse: 79  82  Resp: 16  19  SpO2: 99% 99% 95%  Weight: 64.5 kg    Height: 5\' 11"  (1.803 m)      Constitutional: NAD, calm, comfortable Eyes: PERRL, lids and conjunctivae normal ENMT: Mucous membranes are moist. Posterior pharynx clear of any exudate or lesions.Normal dentition.  Neck: normal, supple, no masses, no thyromegaly Respiratory: clear to auscultation bilaterally, no wheezing, no crackles. Normal respiratory effort. No accessory muscle use.  Cardiovascular: Regular rate and rhythm, no murmurs / rubs / gallops. No extremity edema. 2+ pedal pulses. No carotid bruits.  Abdomen: no tenderness, no masses palpated. No hepatosplenomegaly. Bowel sounds positive.  Musculoskeletal:  no clubbing / cyanosis. No joint deformity upper and lower extremities. Good ROM, no contractures. Normal muscle tone.  Skin: no rashes, lesions, ulcers. No induration Neurologic: LUE 1+/5 strength.  L facial droop. Psychiatric: Normal judgment and insight. Alert and oriented x 3. Normal mood.    Labs on Admission: I have personally reviewed following labs and imaging studies  CBC: Recent Labs  Lab 08/17/20 0735 08/20/20 1901 08/20/20 2016  WBC 4.2 6.6  --   NEUTROABS 3.0 5.4  --   HGB 11.4* 11.5* 13.9  HCT 37.0* 36.8* 41.0  MCV 93.2 92.7  --   PLT 302 316  --    Basic Metabolic Panel: Recent Labs  Lab 08/17/20 0735 08/20/20 1901 08/20/20 2016  NA 142 138 140  K 4.0 3.8 3.6  CL 106 101 103  CO2 24 25  --   GLUCOSE 109* 118* 109*  BUN 13 9 11   CREATININE 0.60* 0.51* 0.40*  CALCIUM 9.5 9.1  --    GFR: Estimated Creatinine Clearance: 77.3 mL/min (A) (by C-G formula based on SCr of 0.4 mg/dL (L)). Liver Function Tests: Recent Labs  Lab 08/17/20 0735 08/20/20 1901  AST 13* 18  ALT 12 15  ALKPHOS 106 93  BILITOT 0.4 0.8  PROT 6.8 6.5  ALBUMIN 3.1* 3.3*   No results for input(s): LIPASE, AMYLASE in the last 168 hours. No results for input(s): AMMONIA in the last 168 hours. Coagulation Profile: Recent Labs  Lab 08/20/20 1901  INR 1.0   Cardiac Enzymes: No results for input(s): CKTOTAL, CKMB, CKMBINDEX, TROPONINI in the last 168 hours. BNP (last 3 results) No results for input(s): PROBNP in the last 8760 hours. HbA1C: No results for input(s): HGBA1C in the last 72 hours. CBG: Recent Labs  Lab 08/20/20 1902  GLUCAP 106*   Lipid Profile: No results for input(s): CHOL, HDL, LDLCALC, TRIG, CHOLHDL, LDLDIRECT in the last 72 hours. Thyroid Function Tests: No results for input(s): TSH, T4TOTAL, FREET4, T3FREE, THYROIDAB in the last 72 hours. Anemia Panel: No results for input(s): VITAMINB12, FOLATE, FERRITIN, TIBC, IRON, RETICCTPCT in the last 72  hours. Urine analysis:    Component Value Date/Time   COLORURINE RED (A) 07/13/2020 1901   APPEARANCEUR TURBID (A) 07/13/2020 1901   LABSPEC  07/13/2020 1901    TEST NOT REPORTED DUE TO COLOR INTERFERENCE OF URINE PIGMENT   PHURINE  07/13/2020 1901    TEST NOT REPORTED DUE TO COLOR INTERFERENCE OF URINE PIGMENT   GLUCOSEU (A) 07/13/2020 1901    TEST NOT REPORTED DUE TO COLOR INTERFERENCE OF URINE PIGMENT   HGBUR (A) 07/13/2020 1901    TEST NOT REPORTED DUE TO COLOR INTERFERENCE OF URINE PIGMENT   BILIRUBINUR (A) 07/13/2020 1901    TEST NOT REPORTED DUE TO COLOR INTERFERENCE OF URINE PIGMENT   KETONESUR (A) 07/13/2020 1901    TEST NOT REPORTED DUE TO COLOR INTERFERENCE OF URINE PIGMENT   PROTEINUR (A) 07/13/2020 1901    TEST NOT REPORTED DUE TO COLOR INTERFERENCE OF URINE PIGMENT   UROBILINOGEN 2.0 (H) 12/13/2013 0738   NITRITE (A) 07/13/2020 1901    TEST NOT REPORTED DUE TO COLOR INTERFERENCE OF URINE PIGMENT   LEUKOCYTESUR (A) 07/13/2020 1901    TEST NOT REPORTED DUE TO COLOR INTERFERENCE OF URINE PIGMENT    Radiological Exams on Admission: CT HEAD CODE STROKE WO CONTRAST  Result Date: 08/20/2020 CLINICAL DATA:  Code stroke.  Weakness and facial droop EXAM: CT HEAD WITHOUT CONTRAST TECHNIQUE: Contiguous axial images were obtained from the base of the skull through the vertex without intravenous contrast. COMPARISON:  MRI 06/30/2020 FINDINGS: Brain: There is no acute intracranial hemorrhage, mass effect, or edema. No acute appearing loss of gray-white differentiation. Infarction of the right corona radiata and basal ganglia seen on prior MRI is identified. There is a chronic left frontal infarction involving the operculum. There may be a chronic cortical infarct of the posterior right opercular region. Additional patchy hypoattenuation in the supratentorial white matter is nonspecific but probably reflects chronic microvascular ischemic changes. Prominence of the ventricles and sulci  reflects generalized parenchymal volume loss. No extra-axial collection. Vascular: No hyperdense vessel. There are foci of calcification along the right M1 and M2 MCA. Skull: Unremarkable. Sinuses/Orbits: No acute finding. Other: Mastoid air cells are clear. ASPECTS [facility] Stroke Program Early CT Score) -  Ganglionic level infarction (caudate, lentiform nuclei, internal capsule, insula, M1-M3 cortex): 7 - Supraganglionic infarction (M4-M6 cortex): 3 Total score (0-10 with 10 being normal): 10 IMPRESSION: There is no acute intracranial hemorrhage or evidence of acute infarction. ASPECT score is 10. Chronic infarcts as described. Foci of calcification along the right M1 and M2 MCA. Could reflect calcified emboli. Right M1 MCA irregularity on the prior MRA is likely related. These results were communicated to Dr. Lorrin Goodell at 7:18 pm on 08/20/2020 by text page via the [facility] messaging system. Electronically Signed   By: Macy Mis M.D.   On: 08/20/2020 19:22    EKG: Independently reviewed.  Assessment/Plan Principal Problem:   Acute ischemic stroke [facility]) Active Problems:   Essential hypertension   Alcohol use   Malignant neoplasm of upper lobe of right lung (HCC)   Mitral valve mass    Acute ischemic stroke - Neuro consult Stroke pathway Tele monitor Repeat 2d echo MRI brain w/wo contrast MRA head and neck PT/OT/SLP ASA 81 for the moment MV mass - ? 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 Looks like next scheduled is 6/13  DVT prophylaxis: Lovenox Code Status: DNR - yellow form at bedside Family Communication: No family in room Disposition Plan: Likely back to SNF after admit Consults called: Dr. Lorrin Goodell Admission status: Admit to inpatient  Severity of Illness: The  appropriate patient status for this patient is INPATIENT. Inpatient status is judged to be reasonable and necessary in order to provide the required intensity of service to ensure the patient's safety. The patient's presenting symptoms, physical exam findings, and initial radiographic and laboratory data in the context of their chronic comorbidities is felt to place them at high risk for further clinical deterioration. Furthermore, it is not anticipated that the patient will be medically stable for discharge from the hospital within 2 midnights of admission. The following factors support the patient status of inpatient.   IP status due to recurrent acute ischemic stroke.   * I certify that at the point of admission it is my clinical judgment that the patient will require inpatient hospital care spanning beyond 2 midnights from the point of admission due to high intensity of service, high risk for further deterioration and high frequency of surveillance required.*   Lanett Lasorsa M. DO [facility]  How to contact the [facility] Attending or Consulting provider [place] or covering provider during after hours [place], for this patient?  Check the care team in [facility] and look for a) attending/consulting [facility] provider listed and b) the [facility] team listed Log into [email or web address]  [employer] Physician Scheduling and messaging for groups and whole hospitals  On call and physician scheduling software for group practices, residents, hospitalists and other medical providers for call, clinic, rotation and shift schedules. On[employer] is a hospital-wide system for scheduling doctors and paging doctors on call. EasyPlot is for scientific plotting and data analysis.  [email or web address]  and use [place]'s universal password to access. If you do not have the password, please contact the hospital operator.  Locate the [facility] provider you are looking for under [facility] and page to a number that you can be directly  reached. If you still have difficulty reaching the provider, please page the [facility] (Director on Call) for the Hospitalists  listed on amion for assistance.  08/20/2020, 8:39 PM

## 2020-08-20 NOTE — ED Notes (Signed)
Tried to give report. Nurse said will call back

## 2020-08-20 NOTE — ED Triage Notes (Signed)
Pt has L side weakness. The L arm is significantly weaker than the L leg. Pt stated last known time WNL was approximately 30 hours prior to arrival. Pt's brother confirmed the time.  Code stroke was cancelled at [facility]

## 2020-08-20 NOTE — Code Documentation (Addendum)
Responded to Code Stroke called at [facility] for L arm/leg weakness and facial droop, LSN-yesterday after therapy(30 hours ago). Pt arrived at [place], CBG-106, NIH-8, CT-areas of calcification but no acute changes. Code Stroke cancelled at [place].

## 2020-08-20 NOTE — Consult Note (Signed)
NEUROLOGY CONSULTATION NOTE   Date of service: August 20, 2020 Patient Name: Cody Douglas MRN:  [id number] DOB:  06-15-1950 Reason for consult: "Stroke code" Requesting Provider: No att. providers found _ _ _   _ __   _ __ _ _  __ __   _ __   __ _  History of Present Illness  Cody Douglas is a 70 y.o. male with PMH significant for HTN, eye surgery, prostrate cancer, stage 3 lung cancer on chemo and radiation, prior smoker, prior external capsule and caudate infarct who presents with left sided weakness.  Initially presented as a stroke code with LKW of 1130 on 08/20/20. On speaking with patient's daughter over phone, she reports that her uncle noticed that patient was weak on the left when he saw him yesterday. Upon questioning patient, he feels he has been weak for about 30 hours now. Does endorse that weakness started yesterday. It got worse this morning with L facial droop and dysarthria.  He was seen by radiation oncologist yesterday afternoon who noticed the left sided weakness and alerted the faciliy that he is in. He was noted to be much more weak today and sent over to the ED.  He is 3 weeks into his first cycle of chemotherapy and radiation per daughter.  mRS: 3 tPA/thrombectomy: outside window LKW: 1200 on 08/19/20. NIHSS components Score: Comment  1a Level of Conscious 0$RemoveBefor'[x]'xcQFCBbwvsEJ$  '[]'$  2$R'[]'cN$  '[]'$      1b LOC Questions 0$RemoveBefore'[x]'yXFLphvtPCiqX$  '[]'$  2$R'[]'mB$       1c LOC Commands 0$RemoveBefor'[x]'qeeFYLUGiKZt$  '[]'$  2$R'[]'mX$       2 Best Gaze 0$RemoveBe'[x]'rKoShPUTN$  '[]'$  2$R'[]'fW$       3 Visual 0$RemoveB'[x]'GxNdOQve$  '[]'$  2$R'[]'Vv$  '[]'$      4 Facial Palsy 0$RemoveBefor'[]'kynYanRxVpNG$  '[]'$  2$R'[x]'vq$  '[]'$      5a Motor Arm - left 0$Remov'[]'fJCnIz$  '[]'$  2$R'[]'DN$  '[x]'$  4$Re'[]'aOR$  U'[]'$    5b Motor Arm - Right 0$Remove'[x]'xPgvDoZ$  '[]'$  2$R'[]'Pc$  '[]'$  4$R'[]'sW$  U'[]'$    6a Motor Leg - Left 0$Remov'[x]'GSooHf$  '[]'$  2$R'[]'Hl$  '[]'$  4$R'[]'sL$  U'[]'$    6b Motor Leg - Right 0$Remove'[x]'BwwFsrl$  '[]'$  2$R'[]'Bz$  '[]'$  4$R'[]'Rc$  U'[]'$    7 Limb Ataxia 0$RemoveBefo'[x]'glfzEpAforl$  '[]'$  2$R'[]'iW$  '[]'$  UN$R'[]'gc$     8 Sensory 0$RemoveBe'[x]'cDXdKjTpP$  '[]'$  2$R'[]'oz$  U'[]'$      9 Best Language 0$RemoveBefore'[x]'yKToryEdSJznM$  '[]'$  2$R'[]'qp$  '[]'$      10 Dysarthria 0$RemoveBefore'[]'OAarjTznQLHSK$  '[x]'$  2$Re'[]'wYb$  U'[]'$      11 Extinct. and Inattention 0$RemoveBeforeDE'[x]'nqFlGgaIFOlzLHp$  '[]'$  2$R'[]'Im$       TOTAL: 6        ROS   Constitutional  Denies weight loss, fever and chills.  HEENT Denies changes in vision and hearing.  Respiratory Denies SOB and cough.   CV Denies palpitations and CP   GI Denies abdominal pain, nausea, vomiting and diarrhea.   GU Denies dysuria and urinary frequency.   MSK Denies myalgia and joint pain.   Skin Denies rash and pruritus.   Neurological Denies headache and syncope.   Psychiatric Denies recent changes in mood. Denies anxiety and depression.    Past History   Past Medical History:  Diagnosis Date  . Anxiety   . Dyspnea    with exertion - has inhaler  . Frequent headaches   . GERD (gastroesophageal reflux disease)   . Hypertension   . Prostate cancer ([place])   . Stroke [facility])    Patient reports having a stroke on 4/13   Past Surgical History:  Procedure Laterality Date  . BRONCHIAL BRUSHINGS  07/09/2020   Procedure: BRONCHIAL BRUSHINGS;  Surgeon: Garner Nash, DO;  Location: [place];  Service:  Pulmonary;;  . BRONCHIAL WASHINGS  07/09/2020   Procedure: BRONCHIAL WASHINGS;  Surgeon: Garner Nash, DO;  Location: [place] [facility];  Service: Pulmonary;;  . CRYOTHERAPY  07/09/2020   Procedure: CRYOTHERAPY;  Surgeon: Garner Nash, DO;  Location: [place] [facility];  Service: Pulmonary;;  . EYE SURGERY    . HEMOSTASIS CONTROL  07/09/2020   Procedure: HEMOSTASIS CONTROL;  Surgeon: Garner Nash, DO;  Location: [place] [facility];  Service: Pulmonary;;  . PROSTATE BIOPSY     Prostate surgery 8-9 yrs ago  . TEE WITHOUT CARDIOVERSION N/A 07/12/2020   Procedure: TRANSESOPHAGEAL ECHOCARDIOGRAM (TEE);  Surgeon: Lelon Perla, MD;  Location: [facility] ENDOSCOPY;  Service: Cardiovascular;  Laterality: N/A;  . VIDEO BRONCHOSCOPY WITH ENDOBRONCHIAL ULTRASOUND Bilateral 07/09/2020   Procedure: VIDEO BRONCHOSCOPY WITH ENDOBRONCHIAL ULTRASOUND;  Surgeon: Garner Nash, DO;  Location: [place];  Service: Pulmonary;  Laterality: Bilateral;   Family History  Problem Relation Age of Onset  . Cancer -  Lung Father   . Heart disease Brother   . Diabetes type II Neg Hx   . Stroke Neg Hx    Social History   Socioeconomic History  . Marital status: Widowed    Spouse name: Not on file  . Number of children: Not on file  . Years of education: Not on file  . Highest education level: Not on file  Occupational History  . Not on file  Tobacco Use  . Smoking status: Every Day    Packs/day: 2.50    Years: 40.00    Pack years: 100.00    Types: Cigarettes  . Smokeless tobacco: Never  Vaping Use  . Vaping Use: Never used  Substance and Sexual Activity  . Alcohol use: Yes    Alcohol/week: 1.0 standard drink    Types: 1 Cans of beer per week    Comment: couple of days a week  . Drug use: No  . Sexual activity: Not Currently  Other Topics Concern  . Not on file  Social History Narrative  . Not on file   Social Determinants of Health   Financial Resource Strain: Not on file  Food Insecurity: Not on file  Transportation Needs: Not on file  Physical Activity: Not on file  Stress: Not on file  Social Connections: Not on file   No Known Allergies  Medications  (Not in a hospital admission)    Vitals   There were no vitals filed for this visit.   There is no height or weight on file to calculate BMI.  Physical Exam   General: Laying comfortably in bed; in no acute distress.  HENT: Normal oropharynx and mucosa. Normal external appearance of ears and nose.  Neck: Supple, no pain or tenderness  CV: No JVD. No peripheral edema.  Pulmonary: Symmetric Chest rise. Normal respiratory effort.  Abdomen: Soft to touch, non-tender.  Ext: No cyanosis, edema, or deformity  Skin: No rash. Normal palpation of skin.   Musculoskeletal: Normal digits and nails by inspection. No clubbing.   Neurologic Examination  Mental status/Cognition: Alert, oriented to self, place, month and year, good attention. Speech/language: mildly dysarthric speech, fluent, comprehension intact, object naming  intact, repetition intact. Cranial nerves:   CN II Pupils equal and reactive to light, no VF deficits    CN III,IV,VI EOM intact, no gaze preference or deviation, no nystagmus    CN V normal sensation in V1, V2, and V3 segments bilaterally    CN VII L facial droop   CN  VIII normal hearing to speech    CN IX & X normal palatal elevation, no uvular deviation    CN XI 5/5 head turn and 5/5 shoulder shrug bilaterally    CN XII midline tongue protrusion    Motor:  Muscle bulk: poor, tone flaccid in LUE, tremor none Mvmt Root Nerve  Muscle Right Left Comments  SA C5/6 Ax Deltoid 5 0   EF C5/6 Mc Biceps 5 0   EE C6/7/8 Rad Triceps 5 0   WF C6/7 Med FCR     WE C7/8 PIN ECU     F Ab C8/T1 U ADM/FDI 5 1   HF L1/2/3 Fem Illopsoas 5 5   KE L2/3/4 Fem Quad 5 5   DF L4/5 D Peron Tib Ant 5 5   PF S1/2 Tibial Grc/Sol 5 5    Reflexes:  Right Left Comments  Pectoralis      Biceps (C5/6) 1 3   Brachioradialis (C5/6) 1 3    Triceps (C6/7) 1 3    Patellar (L3/4) 2 2    Achilles (S1) 1 1    Hoffman      Plantar     Jaw jerk    Sensation:  Light touch intact   Pin prick    Temperature    Vibration   Proprioception    Coordination/Complex Motor:  - Finger to Nose intact on right - Heel to shin intact BL - Rapid alternating movement are slowed. - Gait: Unable to assess 2/2 weakness.  Labs   CBC:  Recent Labs  Lab 08/17/20 0735  WBC 4.2  NEUTROABS 3.0  HGB 11.4*  HCT 37.0*  MCV 93.2  PLT 568    Basic Metabolic Panel:  Lab Results  Component Value Date   NA 142 08/17/2020   K 4.0 08/17/2020   CO2 24 08/17/2020   GLUCOSE 109 (H) 08/17/2020   BUN 13 08/17/2020   CREATININE 0.60 (L) 08/17/2020   CALCIUM 9.5 08/17/2020   GFRNONAA >60 08/17/2020   GFRAA >60 09/12/2019   Lipid Panel:  Lab Results  Component Value Date   LDLCALC 115 (H) 07/01/2020   HgbA1c:  Lab Results  Component Value Date   HGBA1C 6.3 (H) 07/01/2020   Urine Drug Screen:     Component Value  Date/Time   LABOPIA NONE DETECTED 07/01/2020 1633   COCAINSCRNUR NONE DETECTED 07/01/2020 1633   LABBENZ POSITIVE (A) 07/01/2020 1633   AMPHETMU NONE DETECTED 07/01/2020 1633   THCU NONE DETECTED 07/01/2020 1633   LABBARB NONE DETECTED 07/01/2020 1633    Alcohol Level No results found for: [place]  CT Head without contrast: Personally reviewed and CTH was negative for a large hypodensity concerning for a large territory infarct or hyperdensity concerning for an ICH. Notable for prior R Corona radiata + R Basal Ganglia and Left frontal infarcts  MRI Brain: pending Impression   Cody Douglas is a 70 y.o. male with PMH significant for HTN, eye surgery, prostrate cancer, stage 3 lung cancer on chemo and radiation, prior smoker, prior external capsule and caudate infarct who presents with left sided weakness that developed over the last 30 hours. His neurologic examination is notable for L facial droop with dysarthric speech, LUE flaccid paralysis.  Likely this is a stroke in the setting of potential hypercoagulability, he might have a brain met too given recent stage 3 lung cancer, could also be recrudescence given prior R BG and corona radiata stroke.  Recommendations  - Recommend starting  with MRI Brain with and without stroke. If it shows a new stroke, recommend full stroke workup. - aspirin $RemoveB'81mg'SQuHBteH$  daily. ______________________________________________________________________   Thank you for the opportunity to take part in the care of this patient. If you have any further questions, please contact the neurology consultation attending.  Signed,  Greene Pager Number [phone] _ _ _   _ __   _ __ _ _  __ __   _ __   __ _

## 2020-08-20 NOTE — ED Provider Notes (Signed)
Patient presenting with EMS as a code stroke.  Symptoms started at 1130 today when he started having heaviness in his left arm.  He is at a rehab facility and they reported he was able to move his arm all day but 45 minutes prior to arrival he was no longer able to move his arm.  Patient takes no anticoagulation.  On exam patient has left-sided facial droop, mild slurred speech and 1 out of 5 strength in the left upper extremity.  No pronator drift or weakness in the left lower extremity.  Airway is clear.  Patient sent to CT.   Blanchie Dessert, MD 08/20/20 1904

## 2020-08-21 ENCOUNTER — Other Ambulatory Visit (HOSPITAL_COMMUNITY): Payer: Medicare Other

## 2020-08-21 DIAGNOSIS — I77810 Thoracic aortic ectasia: Secondary | ICD-10-CM | POA: Diagnosis not present

## 2020-08-21 DIAGNOSIS — C3411 Malignant neoplasm of upper lobe, right bronchus or lung: Secondary | ICD-10-CM | POA: Diagnosis not present

## 2020-08-21 DIAGNOSIS — I10 Essential (primary) hypertension: Secondary | ICD-10-CM | POA: Diagnosis not present

## 2020-08-21 LAB — LIPID PANEL
Cholesterol: 141 mg/dL (ref 0–200)
HDL: 27 mg/dL — ABNORMAL LOW (ref >40)
LDL Cholesterol: 96 mg/dL (ref 0–99)
Total CHOL/HDL Ratio: 5.2 RATIO
Triglycerides: 88 mg/dL (ref <150)
VLDL: 18 mg/dL (ref 0–40)

## 2020-08-21 LAB — MAGNESIUM: Magnesium: 1.9 mg/dL (ref 1.7–2.4)

## 2020-08-21 LAB — SARS CORONAVIRUS 2 (TAT 6-24 HRS): SARS Coronavirus 2: NEGATIVE

## 2020-08-21 LAB — PHOSPHORUS: Phosphorus: 3 mg/dL (ref 2.5–4.6)

## 2020-08-21 MED ORDER — NYSTATIN 100000 UNIT/ML MT SUSP
5.0000 mL | Freq: Four times a day (QID) | OROMUCOSAL | Status: DC
  Administered 2020-08-21 – 2020-08-27 (×25): 500000 [IU] via OROMUCOSAL
  Filled 2020-08-21 (×25): qty 5

## 2020-08-21 MED ORDER — GADOBUTROL 1 MMOL/ML IV SOLN
6.5000 mL | Freq: Once | INTRAVENOUS | Status: AC | PRN
  Administered 2020-08-21: 6.5 mL via INTRAVENOUS

## 2020-08-21 MED ORDER — ENSURE ENLIVE PO LIQD
237.0000 mL | Freq: Two times a day (BID) | ORAL | Status: DC
  Administered 2020-08-21 – 2020-08-27 (×8): 237 mL via ORAL
  Filled 2020-08-21 (×2): qty 237

## 2020-08-21 MED ORDER — ASPIRIN 81 MG PO CHEW
162.0000 mg | CHEWABLE_TABLET | Freq: Every day | ORAL | Status: DC
  Administered 2020-08-22 – 2020-08-24 (×3): 162 mg via ORAL
  Filled 2020-08-21 (×3): qty 2

## 2020-08-21 NOTE — Progress Notes (Addendum)
PROGRESS NOTE    Cody Douglas   XAJ:[id number]  DOB: 10/11/1950  DOA: 08/20/2020 PCP: Tamsen Roers, MD   Brief Narrative:  Cody Douglas is a 70 year old male who presents from [facility] [place] home who has non-small cell lung cancer and is on chemo and radiation,  hypertension, hypertrophic cardiomyopathy, nicotine abuse, alcohol abuse, prostate cancer who presents for worsening left-sided weakness. He was last hospitalized from 06/30/2020 through 07/15/2020 with an acute right-sided CVA with left-sided weakness and transferred to SNF for rehab. The head in the ED showed no acute infarcts  Subjective: He states his left-sided weakness has improved, specifically in his leg.  He has no other complaints.    Assessment & Plan:   Principal Problem:   Acute ischemic stroke, multifocal  -Worsening weakness of his left arm and leg-there appears to be a left facial droop on exam but when asked to smile, his smile is equal -6/11 > MRI reveals patchy acute ischemic infarcts in the right basal ganglia, right parietal white matter, right frontal and occipital lobes and probable mild associated petechial hemorrhage in the right occipital region.  He has chronic infarcts in the right basal ganglia, left frontal and some scattered remote bilateral cerebral infarcts.  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 - MRA head again reveals severe distal right M1 stenosis -MRA neck does not show any significant stenosis -He was initially placed on aspirin and Plavix when he was last here with the CVA but due to hemoptysis, Plavix was held and he was continued on aspirin 325 mg.  On 5/2 due to hematuria, aspirin was reduced to 81 mg daily.  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   Difficult to place patient No      Consultants:  Neurology Procedures:  None Antimicrobials:  Anti-infectives (From admission, onward)    None        Objective: Vitals:   08/21/20 0300 08/21/20 0500 08/21/20 0645 08/21/20 0733  BP: 106/67 116/75 108/68 112/68  Pulse: 84 88 81 79  Resp: 16 18 20  18  Temp: 98 F (36.7 C) 97.7 F (36.5 C) 97.8 F (36.6 C) 97.8 F (36.6 C)  TempSrc: Oral Oral  Oral  SpO2: 96% 96% 97% 96%  Weight:      Height:       No intake or output data in the 24 hours ending 08/21/20 1119 Filed Weights   08/20/20 1859  Weight: 64.5 kg    Examination: General exam: Appears comfortable  HEENT: PERRLA, oral mucosa moist, no sclera icterus or thrush Respiratory system: Clear to auscultation. Respiratory effort normal. Cardiovascular system: S1 & S2 heard,  RRR.   Gastrointestinal system: Abdomen soft, non-tender, nondistended. Normal bowel sounds. Central nervous system: Alert and oriented. Left arm 3/5, left left 4/5 strength Extremities: No cyanosis, clubbing or edema Skin: No rashes or ulcers Psychiatry:  Mood & affect appropriate.     Data Reviewed: I have personally reviewed following labs and imaging studies  CBC: Recent Labs  Lab 08/17/20 0735 08/20/20 1901 08/20/20 2016  WBC 4.2 6.6  --   NEUTROABS 3.0 5.4  --   HGB 11.4* 11.5* 13.9  HCT 37.0* 36.8* 41.0  MCV 93.2 92.7  --   PLT 302 316  --    Basic Metabolic Panel: Recent Labs  Lab 08/17/20 0735 08/20/20 1901 08/20/20 2016 08/21/20 0141  NA 142 138 140  --   K 4.0 3.8 3.6  --   CL 106 101 103  --   CO2 24 25  --   --   GLUCOSE 109* 118* 109*  --   BUN 13 9 11   --   CREATININE 0.60* 0.51* 0.40*  --   CALCIUM 9.5 9.1  --   --   MG  --   --   --  1.9  PHOS  --   --   --  3.0   GFR: Estimated Creatinine Clearance: 77.3 mL/min (A) (by C-G formula based on SCr of 0.4 mg/dL (L)). Liver Function Tests: Recent Labs  Lab 08/17/20 0735 08/20/20 1901  AST 13* 18  ALT 12 15  ALKPHOS 106 93  BILITOT 0.4 0.8  PROT 6.8 6.5  ALBUMIN 3.1* 3.3*   No results for input(s): LIPASE, AMYLASE in the last 168 hours. No results for input(s): AMMONIA in the last 168 hours. Coagulation Profile: Recent Labs  Lab 08/20/20 1901  INR 1.0   Cardiac Enzymes: No results for input(s): CKTOTAL, CKMB, CKMBINDEX, TROPONINI in the last 168 hours. BNP (last 3 results) No results for input(s): PROBNP in the last 8760 hours. HbA1C: No results for input(s): HGBA1C in the last 72 hours. CBG: Recent Labs  Lab 08/20/20 1902  GLUCAP 106*   Lipid Profile: Recent Labs    08/21/20 0141  CHOL 141  HDL 27*  LDLCALC 96  TRIG 88  CHOLHDL 5.2   Thyroid Function Tests: No results for input(s): TSH, T4TOTAL, FREET4, T3FREE, THYROIDAB in the last 72 hours. Anemia Panel: No results  for input(s): VITAMINB12, FOLATE, FERRITIN, TIBC, IRON, RETICCTPCT in the last 72 hours. Urine analysis:    Component Value Date/Time   COLORURINE RED (A) 07/13/2020 1901   APPEARANCEUR TURBID (A) 07/13/2020 1901   LABSPEC  07/13/2020 1901    TEST NOT REPORTED DUE TO COLOR INTERFERENCE OF URINE PIGMENT   PHURINE  07/13/2020 1901    TEST NOT REPORTED DUE TO COLOR INTERFERENCE OF URINE PIGMENT   GLUCOSEU (A) 07/13/2020 1901    TEST NOT REPORTED DUE TO COLOR INTERFERENCE OF URINE PIGMENT   HGBUR (  A) 07/13/2020 1901    TEST NOT REPORTED DUE TO COLOR INTERFERENCE OF URINE PIGMENT   BILIRUBINUR (A) 07/13/2020 1901    TEST NOT REPORTED DUE TO COLOR INTERFERENCE OF URINE PIGMENT   KETONESUR (A) 07/13/2020 1901    TEST NOT REPORTED DUE TO COLOR INTERFERENCE OF URINE PIGMENT   PROTEINUR (A) 07/13/2020 1901    TEST NOT REPORTED DUE TO COLOR INTERFERENCE OF URINE PIGMENT   UROBILINOGEN 2.0 (H) 12/13/2013 0738   NITRITE (A) 07/13/2020 1901    TEST NOT REPORTED DUE TO COLOR INTERFERENCE OF URINE PIGMENT   LEUKOCYTESUR (A) 07/13/2020 1901    TEST NOT REPORTED DUE TO COLOR INTERFERENCE OF URINE PIGMENT   Sepsis Labs: @LABRCNTIP (procalcitonin:4,lacticidven:4) ) Recent Results (from the past 240 hour(s))  SARS CORONAVIRUS 2 (TAT 6-24 HRS) Nasopharyngeal Nasopharyngeal Swab     Status: None   Collection Time: 08/20/20  9:36 PM   Specimen: Nasopharyngeal Swab  Result Value Ref Range Status   SARS Coronavirus 2 NEGATIVE NEGATIVE Final    Comment: (NOTE) SARS-CoV-2 target nucleic acids are NOT DETECTED.  The SARS-CoV-2 RNA is generally detectable in upper and lower respiratory specimens during the acute phase of infection. Negative results do not preclude SARS-CoV-2 infection, do not rule out co-infections with other pathogens, and should not be used as the sole basis for treatment or other patient management decisions. Negative results must be combined with clinical observations, patient  history, and epidemiological information. The expected result is Negative.  Fact Sheet for Patients: SugarRoll.be  Fact Sheet for Healthcare Providers: https://www.woods-mathews.com/  This test is not yet approved or cleared by the [place] FDA and  has been authorized for detection and/or diagnosis of SARS-CoV-2 by FDA under an Emergency Use Authorization (EUA). This EUA will remain  in effect (meaning this test can be used) for the duration of the COVID-19 declaration under Se ction 564(b)(1) of the Act, 21 U.S.C. section 360bbb-3(b)(1), unless the authorization is terminated or revoked sooner.  Performed at [facility], [place] [address] [place], [place] [address]          Radiology Studies: MR ANGIO HEAD WO CONTRAST  Result Date: 08/21/2020 CLINICAL DATA:  Initial evaluation for acute left-sided weakness, stroke. EXAM: MRI HEAD WITHOUT AND WITH CONTRAST MRA HEAD WITHOUT CONTRAST MRA NECK WITHOUT AND WITH CONTRAST TECHNIQUE: Multiplanar, multi-echo pulse sequences of the brain and surrounding structures were acquired without intravenous contrast. Angiographic images of the Circle of Willis were acquired using MRA technique without intravenous contrast. Angiographic images of the neck were acquired using MRA technique without and with intravenous contrast. Carotid stenosis measurements (when applicable) are obtained utilizing NASCET criteria, using the distal internal carotid diameter as the denominator. CONTRAST:  6.41mL GADAVIST GADOBUTROL 1 MMOL/ML IV SOLN COMPARISON:  Prior CT from 08/20/2020 and MRI from 06/30/2020. FINDINGS: MRI HEAD FINDINGS Brain: Diffuse prominence of the CSF containing spaces compatible with generalized cerebral atrophy. Encephalomalacia and gliosis involving the cortical and subcortical aspect of the anterior left frontal lobe consistent with a chronic ischemic infarct, stable. There has been interval  evolution of previously identified infarct at the right basal ganglia, now largely chronic in appearance. Associated susceptibility artifact now seen at this location, consistent with petechial hemorrhage. Intrinsic T1 hyperintensity likely reflects associated mineralization. Few scattered remote bilateral cerebellar infarcts noted as well. Patchy foci of restricted diffusion are seen involving the posterior right basal ganglia, immediately adjacent to the area prior infarction (series 5, image 79). Mild extension into the  adjacent right external capsule. Additional patchy involvement of the right periatrial white matter (series 5, image 75) as well as the right occipital cortex (series 5, image 74). No significant mass effect. Scattered foci of susceptibility artifact at the right temporal occipital region is new from previous, and could reflect associated petechial hemorrhage (series 18, image 27). Subtle patchy cortical involvement of the right frontal operculum noted as well (a series 5, image 77). No other evidence for acute or subacute ischemia. Gray-white matter differentiation otherwise maintained. No other acute intracranial hemorrhage. 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 No mass lesion, midline shift or mass effect. No hydrocephalus or extra-axial fluid collection. Pituitary gland suprasellar region normal. Midline structures intact. No abnormal enhancement. Pituitary gland and suprasellar region are normal. Midline structures intact and normal. Vascular: Major intracranial vascular flow voids are maintained. Skull and upper cervical spine: Craniocervical junction within normal limits. Bone marrow signal intensity normal. No focal marrow replacing lesion. No scalp soft tissue abnormality. Sinuses/Orbits: Patient status post bilateral ocular lens  replacement. Scattered mucosal thickening noted within the ethmoidal air cells. Paranasal sinuses are otherwise clear. No significant mastoid effusion. Inner ear structures grossly normal. Other: None. MRA HEAD FINDINGS ANTERIOR CIRCULATION: Visualized distal cervical segments of the internal carotid arteries are patent with antegrade flow. Petrous, cavernous, and supraclinoid segments patent without stenosis or other abnormality. A1 segments widely patent. Normal anterior communicating artery complex. Anterior cerebral arteries patent to their distal aspects without stenosis. Left M1 segment widely patent, with short-segment fenestration proximally. Normal left MCA bifurcation. Distal left MCA branches well perfused. Right M1 segment patent proximally. Severe stenosis involving the distal right M1 segment to the level of the bifurcation, stable from previous (series 1053, image 12). Right MCA branches demonstrate decreased perfusion but remain patent distally. Diffuse small vessel atheromatous irregularity seen about the MCA branches bilaterally. POSTERIOR CIRCULATION: Both V4 segments patent to the vertebrobasilar junction without stenosis. Left vertebral artery slightly dominant. Both PICA origins patent and normal. Basilar patent to its distal aspect without stenosis. Superior cerebellar arteries patent bilaterally. Left PCA supplied via the basilar. Fetal type origin of the right PCA. Both PCAs well perfused to their distal aspects without stenosis. No intracranial aneurysm. MRA NECK FINDINGS AORTIC ARCH: Examination technically limited by timing of the contrast bolus. Partially visualized aortic arch grossly within normal limits for caliber with normal 3 vessel morphology. No hemodynamically significant stenosis seen about the origin of the great vessels. RIGHT CAROTID SYSTEM: Visualized right CCA patent from its origin to the bifurcation without stenosis. Mild atheromatous irregularity about the right carotid  bulb/proximal right ICA with associated estimated stenosis of up to 30-40% stenosis by NASCET criteria. Right ICA patent distally without stenosis, evidence for dissection or occlusion. LEFT CAROTID SYSTEM: Left CCA patent from its origin to the bifurcation without stenosis. Mild atheromatous irregularity about the left carotid bulb/proximal left ICA without hemodynamically significant stenosis. Left ICA patent distally without stenosis, evidence for dissection or occlusion. VERTEBRAL ARTERIES: Both vertebral arteries arise from the subclavian arteries. Left vertebral artery dominant. Origins of the vertebral arteries not well evaluated on this technically limited exam. Visualized portions of the vertebral arteries patent without stenosis, evidence for dissection or occlusion. IMPRESSION: MRI HEAD: 1. Patchy small volume acute ischemic infarcts involving the right basal ganglia, right periatrial white matter, and overlying right frontal and occipital cortices as above. Probable  mild associated petechial hemorrhage at the right occipital region. No frank hemorrhagic transformation or mass effect. 2. Interval evolution of previously identified right basal ganglia infarct, now largely chronic in appearance. 3. Additional chronic left frontal infarct, with a few additional scattered remote bilateral cerebellar infarcts. 4. 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 MRA HEAD: 1. 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 2. No other proximal high-grade or correctable stenosis about the major intracranial arterial circulation. 3. Diffuse small vessel atheromatous irregularity. MRA NECK: 1. Mild atheromatous irregularity about the carotid bifurcations/proximal ICAs bilaterally with estimated stenosis of up to 30-40% on the right. No significant stenosis about the left carotid artery system. 2. Widely patent vertebral  arteries within the neck. Left vertebral artery dominant. Electronically Signed   By: Jeannine Boga M.D.   On: 08/21/2020 02:40   MR ANGIO NECK W WO CONTRAST  Result Date: 08/21/2020 CLINICAL DATA:  Initial evaluation for acute left-sided weakness, stroke. EXAM: MRI HEAD WITHOUT AND WITH CONTRAST MRA HEAD WITHOUT CONTRAST MRA NECK WITHOUT AND WITH CONTRAST TECHNIQUE: Multiplanar, multi-echo pulse sequences of the brain and surrounding structures were acquired without intravenous contrast. Angiographic images of the Circle of Willis were acquired using MRA technique without intravenous contrast. Angiographic images of the neck were acquired using MRA technique without and with intravenous contrast. Carotid stenosis measurements (when applicable) are obtained utilizing NASCET criteria, using the distal internal carotid diameter as the denominator. CONTRAST:  6.33mL GADAVIST GADOBUTROL 1 MMOL/ML IV SOLN COMPARISON:  Prior CT from 08/20/2020 and MRI from 06/30/2020. FINDINGS: MRI HEAD FINDINGS Brain: Diffuse prominence of the CSF containing spaces compatible with generalized cerebral atrophy. Encephalomalacia and gliosis involving the cortical and subcortical aspect of the anterior left frontal lobe consistent with a chronic ischemic infarct, stable. There has been interval evolution of previously identified infarct at the right basal ganglia, now largely chronic in appearance. Associated susceptibility artifact now seen at this location, consistent with petechial hemorrhage. Intrinsic T1 hyperintensity likely reflects associated mineralization. Few scattered remote bilateral cerebellar infarcts noted as well. Patchy foci of restricted diffusion are seen involving the posterior right basal ganglia, immediately adjacent to the area prior infarction (series 5, image 79). Mild extension into the adjacent right external capsule. Additional patchy involvement of the right periatrial white matter (series 5, image  75) as well as the right occipital cortex (series 5, image 74). No significant mass effect. Scattered foci of susceptibility artifact at the right temporal occipital region is new from previous, and could reflect associated petechial hemorrhage (series 18, image 27). Subtle patchy cortical involvement of the right frontal operculum noted as well (a series 5, image 77). No other evidence for acute or subacute ischemia. Gray-white matter differentiation otherwise maintained. No other acute intracranial hemorrhage. 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 No mass lesion, midline shift or mass effect. No hydrocephalus or extra-axial fluid collection. Pituitary gland suprasellar region normal. Midline structures intact. No abnormal enhancement. Pituitary gland and suprasellar region are normal. Midline structures intact and normal. Vascular: Major intracranial vascular flow voids are maintained. Skull and upper cervical spine: Craniocervical junction within normal limits. Bone marrow signal intensity normal. No focal marrow replacing lesion. No scalp soft tissue abnormality. Sinuses/Orbits: Patient status post bilateral ocular lens replacement. Scattered mucosal thickening noted within the ethmoidal air cells. Paranasal sinuses are  otherwise clear. No significant mastoid effusion. Inner ear structures grossly normal. Other: None. MRA HEAD FINDINGS ANTERIOR CIRCULATION: Visualized distal cervical segments of the internal carotid arteries are patent with antegrade flow. Petrous, cavernous, and supraclinoid segments patent without stenosis or other abnormality. A1 segments widely patent. Normal anterior communicating artery complex. Anterior cerebral arteries patent to their distal aspects without stenosis. Left M1 segment widely patent, with short-segment fenestration  proximally. Normal left MCA bifurcation. Distal left MCA branches well perfused. Right M1 segment patent proximally. Severe stenosis involving the distal right M1 segment to the level of the bifurcation, stable from previous (series 1053, image 12). Right MCA branches demonstrate decreased perfusion but remain patent distally. Diffuse small vessel atheromatous irregularity seen about the MCA branches bilaterally. POSTERIOR CIRCULATION: Both V4 segments patent to the vertebrobasilar junction without stenosis. Left vertebral artery slightly dominant. Both PICA origins patent and normal. Basilar patent to its distal aspect without stenosis. Superior cerebellar arteries patent bilaterally. Left PCA supplied via the basilar. Fetal type origin of the right PCA. Both PCAs well perfused to their distal aspects without stenosis. No intracranial aneurysm. MRA NECK FINDINGS AORTIC ARCH: Examination technically limited by timing of the contrast bolus. Partially visualized aortic arch grossly within normal limits for caliber with normal 3 vessel morphology. No hemodynamically significant stenosis seen about the origin of the great vessels. RIGHT CAROTID SYSTEM: Visualized right CCA patent from its origin to the bifurcation without stenosis. Mild atheromatous irregularity about the right carotid bulb/proximal right ICA with associated estimated stenosis of up to 30-40% stenosis by NASCET criteria. Right ICA patent distally without stenosis, evidence for dissection or occlusion. LEFT CAROTID SYSTEM: Left CCA patent from its origin to the bifurcation without stenosis. Mild atheromatous irregularity about the left carotid bulb/proximal left ICA without hemodynamically significant stenosis. Left ICA patent distally without stenosis, evidence for dissection or occlusion. VERTEBRAL ARTERIES: Both vertebral arteries arise from the subclavian arteries. Left vertebral artery dominant. Origins of the vertebral arteries not well evaluated  on this technically limited exam. Visualized portions of the vertebral arteries patent without stenosis, evidence for dissection or occlusion. IMPRESSION: MRI HEAD: 1. Patchy small volume acute ischemic infarcts involving the right basal ganglia, right periatrial white matter, and overlying right frontal and occipital cortices as above. Probable mild associated petechial hemorrhage at the right occipital region. No frank hemorrhagic transformation or mass effect. 2. Interval evolution of previously identified right basal ganglia infarct, now largely chronic in appearance. 3. Additional chronic left frontal infarct, with a few additional scattered remote bilateral cerebellar infarcts. 4. 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 MRA HEAD: 1. 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 2. No other proximal high-grade or correctable stenosis about the major intracranial arterial circulation. 3. Diffuse small vessel atheromatous irregularity. MRA NECK: 1. Mild atheromatous irregularity about the carotid bifurcations/proximal ICAs bilaterally with estimated stenosis of up to 30-40% on the right. No significant stenosis about the left carotid artery system. 2. Widely patent vertebral arteries within the neck. Left vertebral artery dominant. Electronically Signed   By: Jeannine Boga M.D.   On: 08/21/2020 02:40   MR BRAIN W WO CONTRAST  Result Date: 08/21/2020 CLINICAL DATA:  Initial evaluation for acute left-sided weakness, stroke. EXAM: MRI HEAD WITHOUT AND WITH CONTRAST MRA HEAD WITHOUT CONTRAST MRA NECK WITHOUT AND WITH CONTRAST TECHNIQUE: Multiplanar, multi-echo pulse sequences of the brain and surrounding structures were acquired without intravenous contrast. Angiographic images of the Circle  of Willis were acquired using MRA technique without intravenous contrast. Angiographic images of the neck were  acquired using MRA technique without and with intravenous contrast. Carotid stenosis measurements (when applicable) are obtained utilizing NASCET criteria, using the distal internal carotid diameter as the denominator. CONTRAST:  6.59mL GADAVIST GADOBUTROL 1 MMOL/ML IV SOLN COMPARISON:  Prior CT from 08/20/2020 and MRI from 06/30/2020. FINDINGS: MRI HEAD FINDINGS Brain: Diffuse prominence of the CSF containing spaces compatible with generalized cerebral atrophy. Encephalomalacia and gliosis involving the cortical and subcortical aspect of the anterior left frontal lobe consistent with a chronic ischemic infarct, stable. There has been interval evolution of previously identified infarct at the right basal ganglia, now largely chronic in appearance. Associated susceptibility artifact now seen at this location, consistent with petechial hemorrhage. Intrinsic T1 hyperintensity likely reflects associated mineralization. Few scattered remote bilateral cerebellar infarcts noted as well. Patchy foci of restricted diffusion are seen involving the posterior right basal ganglia, immediately adjacent to the area prior infarction (series 5, image 79). Mild extension into the adjacent right external capsule. Additional patchy involvement of the right periatrial white matter (series 5, image 75) as well as the right occipital cortex (series 5, image 74). No significant mass effect. Scattered foci of susceptibility artifact at the right temporal occipital region is new from previous, and could reflect associated petechial hemorrhage (series 18, image 27). Subtle patchy cortical involvement of the right frontal operculum noted as well (a series 5, image 77). No other evidence for acute or subacute ischemia. Gray-white matter differentiation otherwise maintained. No other acute intracranial hemorrhage. 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 No mass lesion, midline shift or mass effect. No hydrocephalus or extra-axial fluid collection. Pituitary gland suprasellar region normal. Midline structures intact. No abnormal enhancement. Pituitary gland and suprasellar region are normal. Midline structures intact and normal. Vascular: Major intracranial vascular flow voids are maintained. Skull and upper cervical spine: Craniocervical junction within normal limits. Bone marrow signal intensity normal. No focal marrow replacing lesion. No scalp soft tissue abnormality. Sinuses/Orbits: Patient status post bilateral ocular lens replacement. Scattered mucosal thickening noted within the ethmoidal air cells. Paranasal sinuses are otherwise clear. No significant mastoid effusion. Inner ear structures grossly normal. Other: None. MRA HEAD FINDINGS ANTERIOR CIRCULATION: Visualized distal cervical segments of the internal carotid arteries are patent with antegrade flow. Petrous, cavernous, and supraclinoid segments patent without stenosis or other abnormality. A1 segments widely patent. Normal anterior communicating artery complex. Anterior cerebral arteries patent to their distal aspects without stenosis. Left M1 segment widely patent, with short-segment fenestration proximally. Normal left MCA bifurcation. Distal left MCA branches well perfused. Right M1 segment patent proximally. Severe stenosis involving the distal right M1 segment to the level of the bifurcation, stable from previous (series 1053, image 12). Right MCA branches demonstrate decreased perfusion but remain patent distally. Diffuse small vessel atheromatous irregularity seen about the MCA branches bilaterally. POSTERIOR CIRCULATION: Both V4 segments patent to the vertebrobasilar junction without stenosis. Left vertebral artery slightly dominant. Both PICA origins patent and normal. Basilar patent to its distal aspect without stenosis.  Superior cerebellar arteries patent bilaterally. Left PCA supplied via the basilar. Fetal type origin of the right PCA. Both PCAs well perfused to their distal aspects without stenosis. No intracranial aneurysm. MRA NECK FINDINGS AORTIC ARCH: Examination technically limited by timing of the contrast bolus. Partially visualized aortic arch grossly within  normal limits for caliber with normal 3 vessel morphology. No hemodynamically significant stenosis seen about the origin of the great vessels. RIGHT CAROTID SYSTEM: Visualized right CCA patent from its origin to the bifurcation without stenosis. Mild atheromatous irregularity about the right carotid bulb/proximal right ICA with associated estimated stenosis of up to 30-40% stenosis by NASCET criteria. Right ICA patent distally without stenosis, evidence for dissection or occlusion. LEFT CAROTID SYSTEM: Left CCA patent from its origin to the bifurcation without stenosis. Mild atheromatous irregularity about the left carotid bulb/proximal left ICA without hemodynamically significant stenosis. Left ICA patent distally without stenosis, evidence for dissection or occlusion. VERTEBRAL ARTERIES: Both vertebral arteries arise from the subclavian arteries. Left vertebral artery dominant. Origins of the vertebral arteries not well evaluated on this technically limited exam. Visualized portions of the vertebral arteries patent without stenosis, evidence for dissection or occlusion. IMPRESSION: MRI HEAD: 1. Patchy small volume acute ischemic infarcts involving the right basal ganglia, right periatrial white matter, and overlying right frontal and occipital cortices as above. Probable mild associated petechial hemorrhage at the right occipital region. No frank hemorrhagic transformation or mass effect. 2. Interval evolution of previously identified right basal ganglia infarct, now largely chronic in appearance. 3. Additional chronic left frontal infarct, with a few additional  scattered remote bilateral cerebellar infarcts. 4. 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 MRA HEAD: 1. 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 2. No other proximal high-grade or correctable stenosis about the major intracranial arterial circulation. 3. Diffuse small vessel atheromatous irregularity. MRA NECK: 1. Mild atheromatous irregularity about the carotid bifurcations/proximal ICAs bilaterally with estimated stenosis of up to 30-40% on the right. No significant stenosis about the left carotid artery system. 2. Widely patent vertebral arteries within the neck. Left vertebral artery dominant. Electronically Signed   By: Jeannine Boga M.D.   On: 08/21/2020 02:40   CT HEAD CODE STROKE WO CONTRAST  Result Date: 08/20/2020 CLINICAL DATA:  Code stroke.  Weakness and facial droop EXAM: CT HEAD WITHOUT CONTRAST TECHNIQUE: Contiguous axial images were obtained from the base of the skull through the vertex without intravenous contrast. COMPARISON:  MRI 06/30/2020 FINDINGS: Brain: There is no acute intracranial hemorrhage, mass effect, or edema. No acute appearing loss of gray-white differentiation. Infarction of the right corona radiata and basal ganglia seen on prior MRI is identified. There is a chronic left frontal infarction involving the operculum. There may be a chronic cortical infarct of the posterior right opercular region. Additional patchy hypoattenuation in the supratentorial white matter is nonspecific but probably reflects chronic microvascular ischemic changes. Prominence of the ventricles and sulci reflects generalized parenchymal volume loss. No extra-axial collection. Vascular: No hyperdense vessel. There are foci of calcification along the right M1 and M2 MCA. Skull: Unremarkable. Sinuses/Orbits: No acute finding. Other: Mastoid air cells are clear. ASPECTS ([place] Stroke Program  Early CT Score) - Ganglionic level infarction (caudate, lentiform nuclei, internal capsule, insula, M1-M3 cortex): 7 - Supraganglionic infarction (M4-M6 cortex): 3 Total score (0-10 with 10 being normal): 10 IMPRESSION: There is no acute intracranial hemorrhage or evidence of acute infarction. ASPECT score is 10. Chronic infarcts as described. Foci of calcification along the right M1 and M2 MCA. Could reflect calcified emboli. Right M1 MCA irregularity on the prior MRA is likely related. These results were communicated to Dr. Lorrin Goodell at 7:18 pm on 08/20/2020 by text page via the [facility] messaging system. Electronically Signed   By:  Macy Mis M.D.   On: 08/20/2020 19:22      Scheduled Meds:  ALPRAZolam  0.5 mg Oral TID   aspirin  81 mg Oral Daily   atorvastatin  40 mg Oral Daily   enoxaparin (LOVENOX) injection  40 mg Subcutaneous V78H   folic acid  1 mg Oral Daily   multivitamin with minerals  1 tablet Oral Daily   thiamine  100 mg Oral Daily   Or   thiamine  100 mg Intravenous Daily   Continuous Infusions:   LOS: 1 day      Debbe Odea, MD [facility] Pager: [email or web address] 08/21/2020, 11:19 AM

## 2020-08-21 NOTE — Evaluation (Signed)
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   Thrush 08/03/2020   Encounter for antineoplastic chemotherapy 07/21/2020   Endobronchial mass    Malignant neoplasm of upper lobe of right lung ([place]) 07/06/2020   Acute ischemic stroke ([place]) 07/01/2020   Acute CVA (cerebrovascular accident) ([place]) 06/30/2020   Essential hypertension 06/30/2020   Alcohol use 06/30/2020   Leukocytosis 06/30/2020   Hypertrophic cardiomyopathy ([place]) 12/14/2013   Abnormal ECG 12/13/2013   CAP (community acquired pneumonia) 12/13/2013   Chest pain 12/12/2013   Hyponatremia 12/12/2013   Hypokalemia 12/12/2013   Tobacco abuse 12/12/2013   Past Medical History:  Past Medical History:  Diagnosis Date   Anxiety    Dyspnea    with exertion - has inhaler   Frequent headaches    GERD (gastroesophageal reflux disease)    Hypertension    Prostate cancer ([place])    Stroke [facility])    Patient reports having a stroke on 4/13   Past Surgical History:  Past Surgical History:  Procedure Laterality Date   BRONCHIAL BRUSHINGS  07/09/2020   Procedure: BRONCHIAL BRUSHINGS;  Surgeon: Garner Nash, DO;  Location: [place];  Service: Pulmonary;;   BRONCHIAL WASHINGS  07/09/2020   Procedure: BRONCHIAL WASHINGS;  Surgeon: Garner Nash, DO;  Location: [place] [facility];  Service: Pulmonary;;   CRYOTHERAPY  07/09/2020   Procedure: CRYOTHERAPY;  Surgeon: Garner Nash, DO;  Location: [place] [facility];  Service: Pulmonary;;   EYE SURGERY     HEMOSTASIS CONTROL  07/09/2020   Procedure: HEMOSTASIS CONTROL;  Surgeon: Garner Nash, DO;  Location: [place];  Service: Pulmonary;;   PROSTATE BIOPSY     Prostate surgery 8-9 yrs ago   TEE WITHOUT CARDIOVERSION N/A 07/12/2020    Procedure: TRANSESOPHAGEAL ECHOCARDIOGRAM (TEE);  Surgeon: Lelon Perla, MD;  [facility];  Service: Cardiovascular;  Laterality: N/A;   VIDEO BRONCHOSCOPY WITH ENDOBRONCHIAL ULTRASOUND Bilateral 07/09/2020   Procedure: VIDEO BRONCHOSCOPY WITH ENDOBRONCHIAL ULTRASOUND;  Surgeon: Garner Nash, DO;  Location: [place];  Service: Pulmonary;  Laterality: Bilateral;   HPI:  Cody Douglas is a 70 y.o. male with medical history significant of HTN, prostate CA, smoking and EtOH abuse. He has spent past 3 weeks in rehab. Pt presents to ED 08/20/20 with c/o L arm weakness and L facial droop / dyarthria.  These are similar to prior stroke symptoms but worse. MRI Head reveals Patchy small volume acute ischemic infarcts involving the right  basal ganglia, right periatrial white matter, and overlying right  frontal and occipital cortices as above. Probable mild associated petechial hemorrhage at the right occipital region. Additional chronic left frontal infarct, with a few additional  scattered remote bilateral cerebellar infarcts.   Assessment / Plan / Recommendation Clinical Impression  Cody Douglas is known to [place] service from prior hospitalization last month with CVA, now in with new ischemic infarcts in basal ganglia, right frontal and occipital cortices. He endorses cognitive changes. Initially presenting with dysarthria, speech appears improved and was fully intelligible without obvious slurring. He demonstrates a moderate-severe cognitive impairment, worsened compared to May 2022. Deficits are now in orientation, attention, memory, problem solving, and thought organization. He has good insight into deficits, stating, "my head just ain't right" and "I'm not good at this  stuff." Safety/judgment is impaired as well, as he states he eventually plans to return to his mother's house (who is 65 years old and in poor health). He believes they could "take care of each other." He was unable to state  any possible problems with this as a discharge plan. See scores below.  [facility] Mental Status Examination: Orientation: 2/3 (one day off on day of week) Coding 5 words: required three trials Calculations: 1/3 Divergent naming: 1/3 5 word recall: 3/5 Mental Manipulation/Attention: 1/2 Visual Processing: 4/6 Paragraph Recall: 2/8 Total: 14/30 consistent with mod-severe cognitive impairment  Recommend: continued SLP at this level of care and SNF; full assist for all IADL tasks    SLP Assessment  SLP Recommendation/Assessment: Patient needs continued Speech Lanaguage Pathology Services SLP Visit Diagnosis: Cognitive communication deficit (R41.841)    Follow Up Recommendations  Skilled Nursing facility    Frequency and Duration   1x/wk min        SLP Evaluation Cognition  Overall Cognitive Status: Impaired/Different from baseline Arousal/Alertness: Awake/alert Orientation Level: Oriented to person;Oriented to place;Oriented to situation;Disoriented to time Attention: Focused Focused Attention: Impaired Memory: Impaired Memory Impairment: Storage deficit;Retrieval deficit;Decreased short term memory;Decreased recall of new information Problem Solving: Impaired Executive Function: Reasoning;Organizing Reasoning: Impaired Organizing: Impaired Self Monitoring: Appears intact Safety/Judgment: Appears intact       Comprehension  Auditory Comprehension Overall Auditory Comprehension: Appears within functional limits for tasks assessed    Expression Expression Primary Mode of Expression: Verbal Verbal Expression Overall Verbal Expression: Appears within functional limits for tasks assessed Written Expression Dominant Hand: Right   Oral / Motor  Oral Motor/Sensory Function Overall Oral Motor/Sensory Function: Within functional limits Motor Speech Overall Motor Speech: Appears within functional limits for tasks assessed Respiration: Within functional  limits Phonation: Normal Resonance: Within functional limits Articulation: Within functional limitis Intelligibility: Intelligible Motor Planning: Witnin functional limits Motor Speech Errors: Not applicable     Nikole Swartzentruber P. Red Mandt, M.S., CCC-SLP Speech-Language Pathologist Acute Rehabilitation Services Pager: [place] 08/21/2020, 2:57 PM

## 2020-08-21 NOTE — Plan of Care (Signed)
  Problem: Education: Goal: Knowledge of General Education information will improve Description: Including pain rating scale, medication(s)/side effects and non-pharmacologic comfort measures Outcome: Progressing   Problem: Health Behavior/Discharge Planning: Goal: Ability to manage health-related needs will improve Outcome: Progressing   Problem: Clinical Measurements: Goal: Ability to maintain clinical measurements within normal limits will improve Outcome: Progressing Goal: Will remain free from infection Outcome: Progressing Goal: Diagnostic test results will improve Outcome: Progressing Goal: Respiratory complications will improve Outcome: Progressing Goal: Cardiovascular complication will be avoided Outcome: Progressing   Problem: Activity: Goal: Risk for activity intolerance will decrease Outcome: Progressing   Problem: Nutrition: Goal: Adequate nutrition will be maintained Outcome: Progressing   Problem: Coping: Goal: Level of anxiety will decrease Outcome: Progressing   Problem: Elimination: Goal: Will not experience complications related to bowel motility Outcome: Progressing Goal: Will not experience complications related to urinary retention Outcome: Progressing   Problem: Pain Managment: Goal: General experience of comfort will improve Outcome: Progressing   Problem: Safety: Goal: Ability to remain free from injury will improve Outcome: Progressing   Problem: Skin Integrity: Goal: Risk for impaired skin integrity will decrease Outcome: Progressing   Problem: Education: Goal: Knowledge of disease or condition will improve Outcome: Progressing Goal: Knowledge of secondary prevention will improve Outcome: Progressing Goal: Knowledge of patient specific risk factors addressed and post discharge goals established will improve Outcome: Progressing Goal: Individualized Educational Video(s) Outcome: Progressing   Problem: Coping: Goal: Will verbalize  positive feelings about self Outcome: Progressing   Problem: Health Behavior/Discharge Planning: Goal: Ability to manage health-related needs will improve Outcome: Progressing   Problem: Self-Care: Goal: Ability to participate in self-care as condition permits will improve Outcome: Progressing Goal: Verbalization of feelings and concerns over difficulty with self-care will improve Outcome: Progressing Goal: Ability to communicate needs accurately will improve Outcome: Progressing   Problem: Nutrition: Goal: Risk of aspiration will decrease Outcome: Progressing   Problem: Ischemic Stroke/TIA Tissue Perfusion: Goal: Complications of ischemic stroke/TIA will be minimized Outcome: Progressing   

## 2020-08-21 NOTE — Evaluation (Signed)
Occupational Therapy Evaluation Patient Details Name: Cody Douglas MRN: [id number] DOB: [birth year] Today's Date: 08/21/2020    History of Present Illness 70 y/o male presented to ED on 6/10 with L sided weakness, L facial droop, and dysarthria. MRI small acute ischemic infarcts involving R basal ganglia, R periartial white matter, and overlying R frontal and occipital cortices. PMH: HTN, prostate cancer, lung cancer, CVA 06/2020, GERD   Clinical Impression   PTA, pt was residing at [facility] requiring assistance with ADL, unsure how much assistance pt was receiving. Pt admitted due to status above. He currently required modA for bed mobility, minA +2 for stand-pivot transfer to recliner. He demonstrates decreased independence with ADL/IADL and functional mobility secondary to cognitive limitations, decreased functional use of LUE, generalized weakness, LUE/LLE weakness, and decreased activity tolerance. Due to decline in current level of function, pt would benefit from acute OT to address established goals to facilitate safe D/C to venue listed below. At this time, recommend SNF follow-up. Will continue to follow acutely.     Follow Up Recommendations  SNF;Supervision/Assistance - 24 hour    Equipment Recommendations  3 in 1 bedside commode    Recommendations for Other Services       Precautions / Restrictions Precautions Precautions: Fall Precaution Comments: Reports hx of falls Restrictions Weight Bearing Restrictions: No      Mobility Bed Mobility Overal bed mobility: Needs Assistance Bed Mobility: Supine to Sit     Supine to sit: Mod assist     General bed mobility comments: modA for trunk progression, pt moving BLE    Transfers Overall transfer level: Needs assistance Equipment used: Rolling walker (2 wheeled) Transfers: Sit to/from Stand Sit to Stand: Min assist;+2 physical assistance;+2 safety/equipment Stand pivot transfers: Min assist;+2 physical assistance;+2  safety/equipment       General transfer comment: sit<>stand from EOB, standpivot to recliner, cues to extend knees for more upright supported posture. Pt with tendency to keep bilateral knees in slight flexion, no buckling noted. Pt with small steps, minimal L foot clearance with progression of steps, cues for bigger steps. pt sat prematurely in recliner    Balance Overall balance assessment: Needs assistance Sitting-balance support: Bilateral upper extremity supported;Feet supported Sitting balance-Leahy Scale: Fair Sitting balance - Comments: able to sit EOB with supervision Postural control: Left lateral lean Standing balance support: Bilateral upper extremity supported Standing balance-Leahy Scale: Poor Standing balance comment: relies on UE support and external assist                           ADL either performed or assessed with clinical judgement   ADL Overall ADL's : Needs assistance/impaired Eating/Feeding: Set up;Sitting   Grooming: Set up;Sitting   Upper Body Bathing: Set up;Sitting   Lower Body Bathing: Moderate assistance   Upper Body Dressing : Minimal assistance;Sitting   Lower Body Dressing: Moderate assistance;Sit to/from stand   Toilet Transfer: Minimal assistance;Ambulation;RW (simulated to recliner) Toilet Transfer Details (indicate cue type and reason): Min A for balance and safety Toileting- Clothing Manipulation and Hygiene: Moderate assistance;Sit to/from stand Toileting - Clothing Manipulation Details (indicate cue type and reason): sit<>stand, heavy reliance on RW     Functional mobility during ADLs: Minimal assistance;+2 for physical assistance;Rolling walker;+2 for safety/equipment General ADL Comments: Pt presenting with decreased balance, strength, cognition, and activity tolerance     Vision Patient Visual Report: No change from baseline Vision Assessment?: Yes Eye Alignment: Within Functional Limits Ocular Range of  Motion:  Within Functional Limits Alignment/Gaze Preference: Within Defined Limits Tracking/Visual Pursuits: Able to track stimulus in all quads without difficulty Saccades: Within functional limits Convergence: Within functional limits Visual Fields: No apparent deficits     Perception     Praxis Praxis Praxis tested?: Within functional limits    Pertinent Vitals/Pain Pain Assessment: Faces Faces Pain Scale: Hurts little more Pain Location: chronic back pain Pain Descriptors / Indicators: Aching;Grimacing;Discomfort Pain Intervention(s): Monitored during session;Limited activity within patient's tolerance     Hand Dominance Right   Extremity/Trunk Assessment Upper Extremity Assessment Upper Extremity Assessment: Generalized weakness;LUE deficits/detail LUE Deficits / Details: trace activation grossly;Brunstrom hand III;Brunstrom arm I;sensation intact LUE Sensation: WNL LUE Coordination: decreased fine motor;decreased gross motor   Lower Extremity Assessment Lower Extremity Assessment: Defer to PT evaluation   Cervical / Trunk Assessment Cervical / Trunk Assessment: Kyphotic   Communication Communication Communication: No difficulties   Cognition Arousal/Alertness: Awake/alert Behavior During Therapy: Flat affect Overall Cognitive Status: No family/caregiver present to determine baseline cognitive functioning Area of Impairment: Awareness;Safety/judgement;Problem solving                   Current Attention Level: Selective Memory: Decreased short-term memory   Safety/Judgement: Decreased awareness of safety;Decreased awareness of deficits Awareness: Emergent Problem Solving: Slow processing;Requires verbal cues General Comments: pt oriented x4, slow processing, able to recall 3/3 previous presidents;decreased safety awareness   General Comments       Exercises     Shoulder Instructions      Home Living Family/patient expects to be discharged to:: Skilled  nursing facility                                        Prior Functioning/Environment Level of Independence: Needs assistance  Gait / Transfers Assistance Needed: use of RW ADL's / Homemaking Assistance Needed: pt required assistance from staff   Comments: pt was previously at [facility]        OT Problem List: Decreased strength;Decreased range of motion;Decreased activity tolerance;Impaired balance (sitting and/or standing);Decreased cognition;Decreased safety awareness;Decreased knowledge of use of DME or AE;Decreased knowledge of precautions;Cardiopulmonary status limiting activity;Impaired UE functional use;Pain      OT Treatment/Interventions: Self-care/ADL training;Therapeutic exercise;Neuromuscular education;Energy conservation;DME and/or AE instruction;Therapeutic activities;Cognitive remediation/compensation;Patient/family education;Balance training    OT Goals(Current goals can be found in the care plan section) Acute Rehab OT Goals Patient Stated Goal: Go home OT Goal Formulation: With patient/family Time For Goal Achievement: 09/04/20 Potential to Achieve Goals: Good ADL Goals Pt Will Perform Grooming: with set-up;sitting Pt Will Perform Lower Body Dressing: with supervision;sit to/from stand Pt Will Transfer to Toilet: with min guard assist;ambulating Additional ADL Goal #1: Pt will initiate use of LUE in ADL and HEP independently.  OT Frequency: Min 2X/week   Barriers to D/C:            Co-evaluation PT/OT/SLP Co-Evaluation/Treatment: Yes Reason for Co-Treatment: Complexity of the patient's impairments (multi-system involvement);For patient/therapist safety;To address functional/ADL transfers   OT goals addressed during session: ADL's and self-care      AM-PAC OT "6 Clicks" Daily Activity     Outcome Measure Help from another person eating meals?: None Help from another person taking care of personal grooming?: A Little Help from another  person toileting, which includes using toliet, bedpan, or urinal?: A Lot Help from another person bathing (including washing, rinsing, drying)?: A Lot Help from another  person to put on and taking off regular upper body clothing?: A Little Help from another person to put on and taking off regular lower body clothing?: A Lot 6 Click Score: 16   End of Session Equipment Utilized During Treatment: Gait belt;Rolling walker Nurse Communication: Mobility status  Activity Tolerance: Patient tolerated treatment well Patient left: in bed;with call bell/phone within reach;with bed alarm set;with nursing/sitter in room  OT Visit Diagnosis: Other abnormalities of gait and mobility (R26.89);Unsteadiness on feet (R26.81);Muscle weakness (generalized) (M62.81);Other symptoms and signs involving cognitive function;Pain Pain - part of body:  (Back)                Time: 6237-6283 OT Time Calculation (min): 24 min Charges:  OT General Charges $OT Visit: 1 Visit OT Evaluation $OT Eval Moderate Complexity: [place] OTR/L Acute Rehabilitation Services Office: [place] 08/21/2020, 11:51 AM

## 2020-08-21 NOTE — Progress Notes (Signed)
Pt noted to have no teeth, no dentures, ordered soft diet per food service instructions to make sure pt can eat full meals.

## 2020-08-21 NOTE — Evaluation (Signed)
Physical Therapy Evaluation Patient Details Name: Cody Douglas MRN: [id number] DOB: [birth year] Today's Date: 08/21/2020   History of Present Illness  70 y/o male presented to ED on 6/10 with L sided weakness, L facial droop, and dysarthria. MRI small acute ischemic infarcts involving R basal ganglia, R periartial white matter, and overlying R frontal and occipital cortices. PMH: HTN, prostate cancer, lung cancer, CVA 06/2020, GERD  Clinical Impression  PTA, patient was at [facility] requiring assistance for ADLs but unsure of mobility assistance. Patient currently requires modA for bed mobility and minA+2 for transfers with RW (would benefit from [facility]). Patient presents with generalized weakness, impaired balance, decreased activity tolerance, impaired cognition, and impaired functional mobility. Patient will benefit from skilled PT services during acute stay to address listed deficits. Recommend SNF at discharge to continue maximizing functional mobility and safety.     Follow Up Recommendations SNF (return to SNF)    Equipment Recommendations  Other (comment) (defer to post acute rehab)    Recommendations for Other Services       Precautions / Restrictions Precautions Precautions: Fall Precaution Comments: Reports hx of falls Restrictions Weight Bearing Restrictions: No      Mobility  Bed Mobility Overal bed mobility: Needs Assistance Bed Mobility: Supine to Sit     Supine to sit: Mod assist     General bed mobility comments: modA for trunk progression, pt moving BLE    Transfers Overall transfer level: Needs assistance Equipment used: Rolling Lillia Lengel (2 wheeled) Transfers: Sit to/from [facility] Sit to Stand: Min assist;+2 physical assistance;+2 safety/equipment Stand pivot transfers: Min assist;+2 physical assistance;+2 safety/equipment       General transfer comment: sit<>stand from EOB, standpivot to recliner, cues to extend knees for more upright  supported posture. Pt with tendency to keep bilateral knees in slight flexion, no buckling noted. Pt with small steps, minimal L foot clearance with progression of steps, cues for bigger steps. pt sat prematurely in recliner. Would benefit from [facility] rather than RW  Ambulation/Gait                Stairs            Wheelchair Mobility    Modified Rankin (Stroke Patients Only) Modified Rankin (Stroke Patients Only) Pre-Morbid Rankin Score: Moderately severe disability Modified Rankin: Moderately severe disability     Balance Overall balance assessment: Needs assistance Sitting-balance support: Bilateral upper extremity supported;Feet supported Sitting balance-Leahy Scale: Fair Sitting balance - Comments: able to sit EOB with supervision Postural control: Left lateral lean Standing balance support: Bilateral upper extremity supported Standing balance-Leahy Scale: Poor Standing balance comment: relies on UE support and external assist                             Pertinent Vitals/Pain Pain Assessment: Faces Faces Pain Scale: Hurts little more Pain Location: chronic back pain Pain Descriptors / Indicators: Aching;Grimacing;Discomfort Pain Intervention(s): Monitored during session;Repositioned    Home Living Family/patient expects to be discharged to:: Skilled nursing facility                      Prior Function Level of Independence: Needs assistance   Gait / Transfers Assistance Needed: use of RW  ADL's / Homemaking Assistance Needed: pt required assistance from staff  Comments: pt was previously at [place]: Right    Extremity/Trunk Assessment   Upper  Extremity Assessment Upper Extremity Assessment: Defer to OT evaluation LUE Deficits / Details: trace activation grossly;Brunstrom hand III;Brunstrom arm I;sensation intact LUE Sensation: WNL LUE Coordination: decreased fine motor;decreased gross  motor    Lower Extremity Assessment Lower Extremity Assessment: LLE deficits/detail LLE Deficits / Details: grossly 3/5 LLE Coordination: decreased gross motor    Cervical / Trunk Assessment Cervical / Trunk Assessment: Kyphotic  Communication   Communication: No difficulties  Cognition Arousal/Alertness: Awake/alert Behavior During Therapy: Flat affect Overall Cognitive Status: No family/caregiver present to determine baseline cognitive functioning Area of Impairment: Awareness;Safety/judgement;Problem solving;Attention;Memory                   Current Attention Level: Selective Memory: Decreased short-term memory   Safety/Judgement: Decreased awareness of safety;Decreased awareness of deficits Awareness: Emergent Problem Solving: Slow processing;Requires verbal cues General Comments: pt oriented x4, slow processing, able to recall 3/3 previous presidents;decreased safety awareness      General Comments      Exercises     Assessment/Plan    PT Assessment Patient needs continued PT services  PT Problem List Decreased strength;Decreased range of motion;Decreased activity tolerance;Decreased balance;Decreased mobility;Decreased cognition;Decreased coordination;Decreased safety awareness       PT Treatment Interventions Gait training;DME instruction;Stair training;Functional mobility training;Therapeutic activities;Therapeutic exercise;Balance training;Patient/family education    PT Goals (Current goals can be found in the Care Plan section)  Acute Rehab PT Goals Patient Stated Goal: go home PT Goal Formulation: With patient Time For Goal Achievement: 09/04/20 Potential to Achieve Goals: Fair    Frequency Min 3X/week   Barriers to discharge        Co-evaluation PT/OT/SLP Co-Evaluation/Treatment: Yes Reason for Co-Treatment: Complexity of the patient's impairments (multi-system involvement);For patient/therapist safety;To address functional/ADL  transfers PT goals addressed during session: Mobility/safety with mobility;Balance;Proper use of DME OT goals addressed during session: ADL's and self-care       AM-PAC PT "6 Clicks" Mobility  Outcome Measure Help needed turning from your back to your side while in a flat bed without using bedrails?: A Little Help needed moving from lying on your back to sitting on the side of a flat bed without using bedrails?: A Lot Help needed moving to and from a bed to a chair (including a wheelchair)?: Total Help needed standing up from a chair using your arms (e.g., wheelchair or bedside chair)?: Total Help needed to walk in hospital room?: Total Help needed climbing 3-5 steps with a railing? : Total 6 Click Score: 9    End of Session Equipment Utilized During Treatment: Gait belt Activity Tolerance: Patient tolerated treatment well Patient left: in chair;with call bell/phone within reach;with chair alarm set Nurse Communication: Mobility status PT Visit Diagnosis: Unsteadiness on feet (R26.81);Muscle weakness (generalized) (M62.81);History of falling (Z91.81);Difficulty in walking, not elsewhere classified (R26.2);Other symptoms and signs involving the nervous system (R29.898);Hemiplegia and hemiparesis Hemiplegia - Right/Left: Left Hemiplegia - dominant/non-dominant: Non-dominant Hemiplegia - caused by: Cerebral infarction    Time: 9147-8295 PT Time Calculation (min) (ACUTE ONLY): 19 min   Charges:   PT Evaluation $PT Eval Moderate Complexity: 1 Mod          Jamall Strohmeier A. Gilford Rile PT, DPT Acute Rehabilitation Services Pager [phone] Office [phone]   Linna Hoff 08/21/2020, 11:56 AM

## 2020-08-21 NOTE — TOC Initial Note (Signed)
Transition of Care [facility]) - Initial/Assessment Note    Patient Details  Name: Cody Douglas MRN: [id number] Date of Birth: [birth year]  Transition of Care [facility] CM/SW Contact:    Coralee Pesa, [place] Phone Number: 08/21/2020, 1:52 PM  Clinical Narrative:                  CSW was notified by MD that pt may be able to DC tomorrow. CSW followed up with pt and he is agreeable to returning to [facility], facility stated they would be able to take him with a new insurance auth, this was started. FL2 completed as well. Will follow up with facility when insurance approved. SW  will continue to follow for DC needs. Expected Discharge Plan: Skilled Nursing Facility Barriers to Discharge: Continued Medical Work up, Ship broker   Patient Goals and CMS Choice Patient states their goals for this hospitalization and ongoing recovery are:: Pt agreeable to returning to [facility]. CMS Medicare.gov Compare Post Acute Care list provided to:: Patient Choice offered to / list presented to : Patient  Expected Discharge Plan and Services Expected Discharge Plan: [place] Choice: [place] Living arrangements for the past 2 months: [place]                                      Prior Living Arrangements/Services Living arrangements for the past 2 months: [place] Lives with:: Facility Resident Patient language and need for interpreter reviewed:: Yes Do you feel safe going back to the place where you live?: Yes      Need for Family Participation in Patient Care: No (Comment) Care giver support system in place?: No (comment)   Criminal Activity/Legal Involvement Pertinent to Current Situation/Hospitalization: No - Comment as needed  Activities of Daily Living      Permission Sought/Granted Permission sought to share information with : Facility Art therapist granted to  share information with : Yes, Verbal Permission Granted     Permission granted to share info w AGENCY: [facility]        Emotional Assessment Appearance:: Appears stated age Attitude/Demeanor/Rapport: Engaged Affect (typically observed): Appropriate Orientation: : Oriented to Self, Oriented to Place, Oriented to  Time, Oriented to Situation Alcohol / Substance Use: Not Applicable Psych Involvement: No (comment)  Admission diagnosis:  Acute ischemic stroke ([place]) [I63.9] Acute CVA (cerebrovascular accident) [facility]) [I63.9] Patient Active Problem List   Diagnosis Date Noted   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   Thrush 08/03/2020   Encounter for antineoplastic chemotherapy 07/21/2020   Endobronchial mass    Malignant neoplasm of upper lobe of right lung ([place]) 07/06/2020   Acute ischemic stroke ([place]) 07/01/2020   Acute CVA (cerebrovascular accident) ([place]) 06/30/2020   Essential hypertension 06/30/2020   Alcohol use 06/30/2020   Leukocytosis 06/30/2020   Hypertrophic cardiomyopathy ([place]) 12/14/2013   Abnormal ECG 12/13/2013   CAP (community acquired pneumonia) 12/13/2013   Chest pain 12/12/2013   Hyponatremia 12/12/2013   Hypokalemia 12/12/2013   Tobacco abuse 12/12/2013   PCP:  Tamsen Roers, MD Pharmacy:   [facility] ([place]), [place] [facility] [address] [place] ([place])  [address] Phone: [phone] Fax: [phone]     Social Determinants of Health (SDOH) Interventions    Readmission Risk Interventions No flowsheet data found.

## 2020-08-21 NOTE — Progress Notes (Signed)
STROKE TEAM PROGRESS NOTE   INTERVAL HISTORY  70 year old male who presents from [place] home with a history of NSCLC stage 3 on chemo and radiation,  hypertension, hypertrophic cardiomyopathy, nicotine abuse, alcohol abuse, prostate cancer who presents for worsening left-sided weakness.  He was last hospitalized from 06/30/2020 through 07/15/2020 with an acute right-sided CVA with left-sided weakness and transferred to SNF for rehab.  He had worsening left sided weakness and left facial droop. MRI revealed patchy acute ischemic infarcts in the right basal ganglia, right parietal white matter, right frontal and occipital lobes and probable mild associated petechial hemorrhage in the right occipital region.   Vitals:   08/21/20 0300 08/21/20 0500 08/21/20 0645 08/21/20 0733  BP: 106/67 116/75 108/68 112/68  Pulse: 84 88 81 79  Resp: _0 Temp: 98 F (36.7 C) 97.7 F (36.5 C) 97.8 F (36.6 C) 97.8 F (36.6 C)  TempSrc: Oral Oral  Oral  SpO2: 96% 96% 97% 96%  Weight:      Height:       CBC:  Recent Labs  Lab 08/17/20 0735 08/20/20 1901 08/20/20 2016  WBC 4.2 6.6  --   NEUTROABS 3.0 5.4  --   HGB 11.4* 11.5* 13.9  HCT 37.0* 36.8* 41.0  MCV 93.2 92.7  --   PLT 302 316  --    Basic Metabolic Panel:  Recent Labs  Lab 08/17/20 0735 08/20/20 1901 08/20/20 2016 08/21/20 0141  NA 142 138 140  --   K 4.0 3.8 3.6  --   CL 106 101 103  --   CO2 24 25  --   --   GLUCOSE 109* 118* 109*  --   BUN _1 --   CREATININE 0.60* 0.51* 0.40*  --   CALCIUM 9.5 9.1  --   --   MG  --   --   --  1.9  PHOS  --   --   --  3.0   Lipid Panel:  Recent Labs  Lab 08/21/20 0141  CHOL 141  TRIG 88  HDL 27*  CHOLHDL 5.2  VLDL 18  LDLCALC 96   HgbA1c: No results for input(s): HGBA1C in the last 168 hours. Urine Drug Screen: No results for input(s): LABOPIA, COCAINSCRNUR, LABBENZ, AMPHETMU, THCU, LABBARB in the last 168 hours.    IMAGING past 24 hours  CT  head Result read: 08/20/2020 Impression: There is no acute intracranial hemorrhage or evidence of acute infarction. ASPECT score is 10.   Chronic infarcts as described.  Foci of calcification along the right M1 and M2 MCA. Could reflect calcified emboli. Right M1 MCA irregularity on the prior MRA is likely related.   MRI HEAD: Result read: 08/21/2020 Impression: 1. Patchy small volume acute ischemic infarcts involving the right basal ganglia, right periatrial white matter, and overlying right frontal and occipital cortices. Probable mild associated petechial hemorrhage at the right occipital region. No frank hemorrhagic transformation or mass effect. 2. Interval evolution of previously identified right basal ganglia infarct, now largely chronic in appearance. 3. Additional chronic left frontal infarct, with a few additional scattered remote bilateral cerebellar infarcts. 4. 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   MRA HEAD  Result read: 08/21/2020 Impression: 1. 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 2. No other proximal high-grade or correctable stenosis about the major intracranial arterial circulation. 3. Diffuse small vessel atheromatous irregularity.  MRA NECK: Result read: 08/21/2020 Impression: 1. Mild atheromatous irregularity about the carotid bifurcations/proximal ICAs bilaterally with estimated stenosis of up to 30-40% on the right. No significant stenosis about the left carotid artery system. 2. Widely patent vertebral arteries within the neck. Left vertebral artery dominant.   PHYSICAL EXAM General: Laying comfortably in bed; in no acute distress. HENT: Normal oropharynx and mucosa. Normal external appearance of ears and nose. Neck: Supple, no pain or tenderness CV: No JVD. No peripheral edema. Pulmonary: Symmetric Chest rise. Normal respiratory  effort. Abdomen: Soft to touch, non-tender. Ext: No cyanosis, edema, or deformity  Skin: No rash. Normal palpation of skin.    Mental status/Cognition: Alert, oriented to self, place, month and year, good attention. Speech/language: mildly dysarthric speech, fluent, comprehension intact, object naming intact, repetition intact. Cranial nerves:   CN II Pupils equal and reactive to light, no VF deficits   CN III,IV,VI EOM intact, no gaze preference or deviation, no nystagmus   CN V normal sensation in V1, V2, and V3 segments bilaterally   CN VII Mild L facial droop   CN VIII normal hearing to speech   CN IX & X normal palatal elevation, no uvular deviation   CN XI 5/5 head turn and 5/5 shoulder shrug bilaterally   CN XII midline tongue protrusion  Coordination/Complex Motor: - Finger to Nose intact on right - Heel to shin intact BL - Rapid alternating movement are slowed. - Gait: deferred  ASSESSMENT/PLAN Cody Douglas is a 70 y.o. male with history of HTN, prostate cancer, stage 3 lung cancer on chemo and radiation, prior smoker, prior external capsule and caudate infarct who presents with left sided weakness, left facial droop, and dysarthria.  He did not receive tPA because he was outside the window.   Stroke:  right basal ganglia, right parietal white matter and frontal and occipital cortices infarcts likely due to severe distal right M1 stenosis and/or potential hypercoagulable state given recent stage 3 lung cancer.  Code Stroke: no acute intracranial hemorrhage or evidence of acute       infarction. ASPECT score is 10. MRI  Patchy small volume acute ischemic infarcts involving the right       basal ganglia, right periatrial white matter, and overlying right frontal and occipital cortices MRA head/neck severe distal right M1 stenosis with diminished perfusion      distally, stable from previous. 2D Echo pending LDL 96 HgbA1c 6.3 VTE prophylaxis - lovenox Diet: heart  healthy aspirin 81 mg daily prior to admission, now on aspirin 81 mg daily. Previously on DAPT with ASA 370m and Plavix 786mbut he developed hemoptysis and hematuria.  Recommend increase aspirin to 162 mg daily and monitor signs of bleeding  Therapy recommendations:  SNF Disposition:  pending  Hx stroke:  07/02/20 he was found to have right BG infarct with high grade stenosis of right M1. Recommended ASA 325 and plavix 75 DAPT for 3 months given the intracranial stenosis. However, pt was found to have hemoptysis, DAPT was on hold. Placed on ASA 32553mnly for lower bleeding risk.  On 5/2 he developed hematuria, aspirin was then reduced to 5m54mypertension Home meds:  Toprol XL 25mg57mly stable Avoid low BP Long term BP goal 130-150 given high-grade right MCA stenosis  Mitral valve abnormality 07/01/2020: 2D Echo EF 55 to 60%.  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  Echo pending  Hyperlipidemia Home meds:  none LDL 96, goal < 70 Atorvastatin 85m daily Continue statin at discharge  Diabetes type II controlled Home meds:  none HgbA1c 6.3, goal < 7.0 CBGs Recent Labs    08/20/20 1902  GLUCAP 106*    SSI   Tobacco abuse Current smoker Smoking cessation counseling provided Pt is willing to quit   Alcohol abuse Heavy alcohol in the past, 1 bottle of vodka once a week Alcohol limitation education provided Recommend CIWA protocol Add B1/MVI/FA  Other Stroke Risk Factors Advanced Age >/= 652 Cigarette smoker advised to stop smoking Migraines  Other Active Problems GERD Prostate Cancer NSCLC stage 3: currently receiving chemo and radiation therapy Anxiety: xanax 0.556mTID  Hospital day # 1  Cody  Douglas, ACNP-BC Stroke NP  To contact Stroke Continuity provider, please refer to [email or web address] hours, contact General Neurology

## 2020-08-21 NOTE — NC FL2 (Signed)
[place] LEVEL OF CARE SCREENING TOOL     IDENTIFICATION  Patient Name: Cody Douglas Birthdate: [birth year] Sex: male Admission Date (Current Location): 08/20/2020  [facility] and [place] Number:  Herbalist and Address:  [facility] [place] [address] [place], [place] [address]      Provider Number: [id number]  Attending Physician Name and Address:  Debbe Odea, MD  Relative Name and Phone Number:  Shelbie Proctor, [phone]    Current Level of Care: Hospital Recommended Level of Care: [place] Prior Approval Number:    Date Approved/Denied:   PASRR Number: [id number]  Discharge Plan: SNF    Current Diagnoses: Patient Active Problem List   Diagnosis Date Noted   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   Thrush 08/03/2020   Encounter for antineoplastic chemotherapy 07/21/2020   Endobronchial mass    Malignant neoplasm of upper lobe of right lung ([place]) 07/06/2020   Acute ischemic stroke ([place]) 07/01/2020   Acute CVA (cerebrovascular accident) ([place]) 06/30/2020   Essential hypertension 06/30/2020   Alcohol use 06/30/2020   Leukocytosis 06/30/2020   Hypertrophic cardiomyopathy ([place]) 12/14/2013   Abnormal ECG 12/13/2013   CAP (community acquired pneumonia) 12/13/2013   Chest pain 12/12/2013   Hyponatremia 12/12/2013   Hypokalemia 12/12/2013   Tobacco abuse 12/12/2013    Orientation RESPIRATION BLADDER Height & Weight     Self, Time, Situation, Place  Normal Incontinent, External catheter Weight: 142 lb 1.6 oz (64.5 kg) Height:  5\' 11"  (180.3 cm)  BEHAVIORAL SYMPTOMS/MOOD NEUROLOGICAL BOWEL NUTRITION STATUS      Continent Diet (heart healthy)  AMBULATORY STATUS COMMUNICATION OF NEEDS Skin   Extensive Assist Verbally Normal                       Personal Care Assistance Level of Assistance  Bathing, Feeding, Dressing Bathing Assistance: Maximum assistance Feeding assistance: Limited assistance Dressing  Assistance: Maximum assistance     Functional Limitations Info  Speech, Sight, Hearing Sight Info: Adequate Hearing Info: Impaired Speech Info: Impaired (dysarthria)    SPECIAL CARE FACTORS FREQUENCY  PT (By licensed PT), OT (By licensed OT), Speech therapy     PT Frequency: 5x week OT Frequency: 5x week     Speech Therapy Frequency: 5x week      Contractures Contractures Info: Not present    Additional Factors Info  Code Status, Allergies, Psychotropic Code Status Info: DNR Allergies Info: NKA Psychotropic Info: Alprozolam (Xanax)         Current Medications (08/21/2020):  This is the current hospital active medication list Current Facility-Administered Medications  Medication Dose Route Frequency Provider Last Rate Last Admin   acetaminophen (TYLENOL) tablet 650 mg  650 mg Oral Q4H PRN Etta Quill, DO       Or   acetaminophen (TYLENOL) 160 MG/5ML solution 650 mg  650 mg Per Tube Q4H PRN Etta Quill, DO       Or   acetaminophen (TYLENOL) suppository 650 mg  650 mg Rectal Q4H PRN Etta Quill, DO       ALPRAZolam Duanne Moron) tablet 0.5 mg  0.5 mg Oral TID Etta Quill, DO   0.5 mg at 08/21/20 0908   aspirin chewable tablet 81 mg  81 mg Oral Daily Etta Quill, DO   81 mg at 08/21/20 0908   atorvastatin (LIPITOR) tablet 40 mg  40 mg Oral Daily Etta Quill, DO  40 mg at 08/21/20 0908   enoxaparin (LOVENOX) injection 40 mg  40 mg Subcutaneous Q24H Jennette Kettle M, DO   40 mg at 29/92/42 6834   folic acid (FOLVITE) tablet 1 mg  1 mg Oral Daily Jennette Kettle M, DO   1 mg at 08/21/20 0908   LORazepam (ATIVAN) tablet 1-4 mg  1-4 mg Oral Q1H PRN Etta Quill, DO       Or   LORazepam (ATIVAN) injection 1-4 mg  1-4 mg Intravenous Q1H PRN Etta Quill, DO       multivitamin with minerals tablet 1 tablet  1 tablet Oral Daily Jennette Kettle M, DO   1 tablet at 08/21/20 0908   oxyCODONE-acetaminophen (PERCOCET/ROXICET) 5-325 MG per tablet 1 tablet  1  tablet Oral Q8H PRN Etta Quill, DO   1 tablet at 08/21/20 0757   thiamine tablet 100 mg  100 mg Oral Daily Jennette Kettle M, DO   100 mg at 08/21/20 1962   Or   thiamine (B-1) injection 100 mg  100 mg Intravenous Daily Jennette Kettle M, DO   100 mg at 08/20/20 2359     Discharge Medications: Please see discharge summary for a list of discharge medications.  Relevant Imaging Results:  Relevant Lab Results:   Additional Information SS#: [id number]  Coralee Pesa, LCSWA

## 2020-08-22 ENCOUNTER — Other Ambulatory Visit (HOSPITAL_COMMUNITY): Payer: Medicare Other

## 2020-08-22 ENCOUNTER — Inpatient Hospital Stay (HOSPITAL_COMMUNITY): Payer: Medicare Other

## 2020-08-22 DIAGNOSIS — I6389 Other cerebral infarction: Secondary | ICD-10-CM

## 2020-08-22 DIAGNOSIS — B370 Candidal stomatitis: Secondary | ICD-10-CM | POA: Diagnosis not present

## 2020-08-22 DIAGNOSIS — C3411 Malignant neoplasm of upper lobe, right bronchus or lung: Secondary | ICD-10-CM | POA: Diagnosis not present

## 2020-08-22 DIAGNOSIS — I058 Other rheumatic mitral valve diseases: Secondary | ICD-10-CM | POA: Diagnosis not present

## 2020-08-22 DIAGNOSIS — I639 Cerebral infarction, unspecified: Secondary | ICD-10-CM | POA: Diagnosis not present

## 2020-08-22 DIAGNOSIS — I10 Essential (primary) hypertension: Secondary | ICD-10-CM | POA: Diagnosis not present

## 2020-08-22 LAB — COMPREHENSIVE METABOLIC PANEL
ALT: 14 U/L (ref 0–44)
AST: 16 U/L (ref 15–41)
Albumin: 3 g/dL — ABNORMAL LOW (ref 3.5–5.0)
Alkaline Phosphatase: 99 U/L (ref 38–126)
Anion gap: 7 (ref 5–15)
BUN: 13 mg/dL (ref 8–23)
CO2: 26 mmol/L (ref 22–32)
Calcium: 9 mg/dL (ref 8.9–10.3)
Chloride: 104 mmol/L (ref 98–111)
Creatinine, Ser: 0.45 mg/dL — ABNORMAL LOW (ref 0.61–1.24)
GFR, Estimated: >60 mL/min (ref >60)
Glucose, Bld: 108 mg/dL — ABNORMAL HIGH (ref 70–99)
Potassium: 3.6 mmol/L (ref 3.5–5.1)
Sodium: 137 mmol/L (ref 135–145)
Total Bilirubin: 0.7 mg/dL (ref 0.3–1.2)
Total Protein: 6 g/dL — ABNORMAL LOW (ref 6.5–8.1)

## 2020-08-22 LAB — ECHOCARDIOGRAM COMPLETE
AR max vel: 3.43 cm2
AV Area VTI: 3.77 cm2
AV Area mean vel: 3.2 cm2
AV Mean grad: 5 mmHg
AV Peak grad: 8.9 mmHg
Ao pk vel: 1.49 m/s
Area-P 1/2: 4.29 cm2
Height: 71 in
P 1/2 time: 359 msec
S' Lateral: 3.1 cm
Weight: 2273.6 oz

## 2020-08-22 LAB — CBC
HCT: 36.3 % — ABNORMAL LOW (ref 39.0–52.0)
Hemoglobin: 11.5 g/dL — ABNORMAL LOW (ref 13.0–17.0)
MCH: 29.5 pg (ref 26.0–34.0)
MCHC: 31.7 g/dL (ref 30.0–36.0)
MCV: 93.1 fL (ref 80.0–100.0)
Platelets: 318 10*3/uL (ref 150–400)
RBC: 3.9 MIL/uL — ABNORMAL LOW (ref 4.22–5.81)
RDW: 14.4 % (ref 11.5–15.5)
WBC: 5.3 10*3/uL (ref 4.0–10.5)
nRBC: 0 % (ref 0.0–0.2)

## 2020-08-22 MED ORDER — WHITE PETROLATUM EX OINT
TOPICAL_OINTMENT | CUTANEOUS | Status: AC
  Filled 2020-08-22: qty 28.35

## 2020-08-22 NOTE — Progress Notes (Addendum)
PROGRESS NOTE    DRAGON THRUSH   YTK:[id number]  DOB: 03/02/1951  DOA: 08/20/2020 PCP: Tamsen Roers, MD   Brief Narrative:  Cody Douglas is a 70 year old male who presents from [facility] who has non-small cell lung cancer and is on chemo and radiation,  hypertension,  nicotine abuse, alcohol abuse, prostate cancer who presents for worsening left-sided weakness. He was last hospitalized from 06/30/2020 through 07/15/2020 with an acute right-sided CVA with left-sided weakness and transferred to SNF for rehab. The head in the ED showed no acute infarcts  Subjective: His left sided weakness is the same as yesterday. No hemoptysis.    Assessment & Plan:   Principal Problem:   Acute ischemic stroke, multifocal  -Worsening weakness of his left arm and leg-there appears to be a left facial droop on exam but when asked to smile, his smile is equal -6/11 > MRI reveals patchy acute ischemic infarcts in the right basal ganglia, right parietal white matter, right frontal and occipital lobes and probable mild associated petechial hemorrhage in the right occipital region.  He has chronic infarcts in the right basal ganglia, left frontal and some scattered remote bilateral cerebral infarcts.  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 - MRA head again reveals severe distal right M1 stenosis -MRA neck does not show any significant stenosis -He was initially placed on aspirin and Plavix when he was last here with the CVA but due to hemoptysis, Plavix was held and he was continued on aspirin 325 mg.  On 5/2 due to hematuria, aspirin was reduced to 81 mg daily.  He is currently having no hemoptysis or hematuria -Also noted on TEE on 5/2 was a small mass on the aortic valve (cannot rule out vegetation) and the right cusp appeared to be perforated with mild to moderate AI - repeat ECHO pending -Continue stroke work-up and  follow-up on stroke team recommendations -continue aspirin 162 mg per neuro - LDL 96- cont atorvastatin  Active Problems:  Cognitive imparment - 14/30- needs formal cognitive testing as outpt  Aortic dilatation Aortic root dilatation of 41 mm with moderate plaque on TEE on 5/2    Essential hypertension- grade 1 d CHF - Have been holding Toprol to allow for permissive hypertension   Thrush - likely related to chemo - started Nystatin on 6/11    Alcohol use -DC CIWA scale - no signs of withdrawal    Malignant neoplasm of upper lobe of right lung  -Regarding radiation and chemo with next treatment scheduled on 6/13  Underweight Body mass index is 19.82 kg/m.    Time spent in minutes: 35 DVT prophylaxis: enoxaparin (LOVENOX) injection 40 mg Start: 08/20/20 2045  Code Status: DO NOT RESUSCITATE Family Communication:  Level of Care: Level of care: Telemetry Medical Disposition Plan:  Status is: Inpatient  Remains inpatient appropriate because:Inpatient level of care appropriate due to severity of illness  Dispo: The patient is from: SNF              Anticipated d/c is to: SNF              Patient currently is not medically stable to d/c.   Difficult to place patient No      Consultants:  Neurology Procedures:  None Antimicrobials:  Anti-infectives (From admission, onward)    None        Objective: Vitals:   08/21/20 2003 08/22/20 0025 08/22/20  0350 08/22/20 0749  BP: 114/69 109/69 112/64 111/68  Pulse: 96 86 87 91  Resp: 20 19 20 16   Temp: 98.5 F (36.9 C) 98.3 F (36.8 C) 98.7 F (37.1 C) (!) 97.5 F (36.4 C)  TempSrc: Oral Oral  Oral  SpO2: 99% 94% 93% 100%  Weight:      Height:        Intake/Output Summary (Last 24 hours) at 08/22/2020 1000 Last data filed at 08/22/2020 0700 Gross per 24 hour  Intake --  Output 725 ml  Net -725 ml   Filed Weights   08/20/20 1859  Weight: 64.5 kg    Examination: General exam: Appears comfortable   HEENT: PERRLA, oral mucosa moist, no sclera icterus or thrush Respiratory system: Clear to auscultation. Respiratory effort normal. Cardiovascular system: S1 & S2 heard, regular rate and rhythm Gastrointestinal system: Abdomen soft, non-tender, nondistended. Normal bowel sounds   Central nervous system: Alert and oriented. Left arm 0/5. Left leg 4-5 /5 Extremities: No cyanosis, clubbing or edema Skin: No rashes or ulcers Psychiatry:  Mood & affect appropriate.      Data Reviewed: I have personally reviewed following labs and imaging studies  CBC: Recent Labs  Lab 08/17/20 0735 08/20/20 1901 08/20/20 2016 08/22/20 0843  WBC 4.2 6.6  --  5.3  NEUTROABS 3.0 5.4  --   --   HGB 11.4* 11.5* 13.9 11.5*  HCT 37.0* 36.8* 41.0 36.3*  MCV 93.2 92.7  --  93.1  PLT 302 316  --  323    Basic Metabolic Panel: Recent Labs  Lab 08/17/20 0735 08/20/20 1901 08/20/20 2016 08/21/20 0141 08/22/20 0843  NA 142 138 140  --  137  K 4.0 3.8 3.6  --  3.6  CL 106 101 103  --  104  CO2 24 25  --   --  26  GLUCOSE 109* 118* 109*  --  108*  BUN 13 9 11   --  13  CREATININE 0.60* 0.51* 0.40*  --  0.45*  CALCIUM 9.5 9.1  --   --  9.0  MG  --   --   --  1.9  --   PHOS  --   --   --  3.0  --     GFR: Estimated Creatinine Clearance: 77.3 mL/min (A) (by C-G formula based on SCr of 0.45 mg/dL (L)). Liver Function Tests: Recent Labs  Lab 08/17/20 0735 08/20/20 1901 08/22/20 0843  AST 13* 18 16  ALT 12 15 14   ALKPHOS 106 93 99  BILITOT 0.4 0.8 0.7  PROT 6.8 6.5 6.0*  ALBUMIN 3.1* 3.3* 3.0*    No results for input(s): LIPASE, AMYLASE in the last 168 hours. No results for input(s): AMMONIA in the last 168 hours. Coagulation Profile: Recent Labs  Lab 08/20/20 1901  INR 1.0    Cardiac Enzymes: No results for input(s): CKTOTAL, CKMB, CKMBINDEX, TROPONINI in the last 168 hours. BNP (last 3 results) No results for input(s): PROBNP in the last 8760 hours. HbA1C: No results for  input(s): HGBA1C in the last 72 hours. CBG: Recent Labs  Lab 08/20/20 1902  GLUCAP 106*    Lipid Profile: Recent Labs    08/21/20 0141  CHOL 141  HDL 27*  LDLCALC 96  TRIG 88  CHOLHDL 5.2    Thyroid Function Tests: No results for input(s): TSH, T4TOTAL, FREET4, T3FREE, THYROIDAB in the last 72 hours. Anemia Panel: No results for input(s): VITAMINB12, FOLATE, FERRITIN, TIBC, IRON, RETICCTPCT  in the last 72 hours. Urine analysis:    Component Value Date/Time   COLORURINE RED (A) 07/13/2020 1901   APPEARANCEUR TURBID (A) 07/13/2020 1901   LABSPEC  07/13/2020 1901    TEST NOT REPORTED DUE TO COLOR INTERFERENCE OF URINE PIGMENT   PHURINE  07/13/2020 1901    TEST NOT REPORTED DUE TO COLOR INTERFERENCE OF URINE PIGMENT   GLUCOSEU (A) 07/13/2020 1901    TEST NOT REPORTED DUE TO COLOR INTERFERENCE OF URINE PIGMENT   HGBUR (A) 07/13/2020 1901    TEST NOT REPORTED DUE TO COLOR INTERFERENCE OF URINE PIGMENT   BILIRUBINUR (A) 07/13/2020 1901    TEST NOT REPORTED DUE TO COLOR INTERFERENCE OF URINE PIGMENT   KETONESUR (A) 07/13/2020 1901    TEST NOT REPORTED DUE TO COLOR INTERFERENCE OF URINE PIGMENT   PROTEINUR (A) 07/13/2020 1901    TEST NOT REPORTED DUE TO COLOR INTERFERENCE OF URINE PIGMENT   UROBILINOGEN 2.0 (H) 12/13/2013 0738   NITRITE (A) 07/13/2020 1901    TEST NOT REPORTED DUE TO COLOR INTERFERENCE OF URINE PIGMENT   LEUKOCYTESUR (A) 07/13/2020 1901    TEST NOT REPORTED DUE TO COLOR INTERFERENCE OF URINE PIGMENT   Sepsis Labs: @LABRCNTIP (procalcitonin:4,lacticidven:4) ) Recent Results (from the past 240 hour(s))  SARS CORONAVIRUS 2 (TAT 6-24 HRS) Nasopharyngeal Nasopharyngeal Swab     Status: None   Collection Time: 08/20/20  9:36 PM   Specimen: Nasopharyngeal Swab  Result Value Ref Range Status   SARS Coronavirus 2 NEGATIVE NEGATIVE Final    Comment: (NOTE) SARS-CoV-2 target nucleic acids are NOT DETECTED.  The SARS-CoV-2 RNA is generally detectable in upper  and lower respiratory specimens during the acute phase of infection. Negative results do not preclude SARS-CoV-2 infection, do not rule out co-infections with other pathogens, and should not be used as the sole basis for treatment or other patient management decisions. Negative results must be combined with clinical observations, patient history, and epidemiological information. The expected result is Negative.  Fact Sheet for Patients: SugarRoll.be  Fact Sheet for Healthcare Providers: https://www.woods-mathews.com/  This test is not yet approved or cleared by the [place] FDA and  has been authorized for detection and/or diagnosis of SARS-CoV-2 by FDA under an Emergency Use Authorization (EUA). This EUA will remain  in effect (meaning this test can be used) for the duration of the COVID-19 declaration under Se ction 564(b)(1) of the Act, 21 U.S.C. section 360bbb-3(b)(1), unless the authorization is terminated or revoked sooner.  Performed at [facility], [place] [address] [place], [place] [address]          Radiology Studies: MR ANGIO HEAD WO CONTRAST  Result Date: 08/21/2020 CLINICAL DATA:  Initial evaluation for acute left-sided weakness, stroke. EXAM: MRI HEAD WITHOUT AND WITH CONTRAST MRA HEAD WITHOUT CONTRAST MRA NECK WITHOUT AND WITH CONTRAST TECHNIQUE: Multiplanar, multi-echo pulse sequences of the brain and surrounding structures were acquired without intravenous contrast. Angiographic images of the Circle of Willis were acquired using MRA technique without intravenous contrast. Angiographic images of the neck were acquired using MRA technique without and with intravenous contrast. Carotid stenosis measurements (when applicable) are obtained utilizing NASCET criteria, using the distal internal carotid diameter as the denominator. CONTRAST:  6.59mL GADAVIST GADOBUTROL 1 MMOL/ML IV SOLN COMPARISON:  Prior CT from 08/20/2020  and MRI from 06/30/2020. FINDINGS: MRI HEAD FINDINGS Brain: Diffuse prominence of the CSF containing spaces compatible with generalized cerebral atrophy. Encephalomalacia and gliosis involving the cortical and subcortical aspect of  the anterior left frontal lobe consistent with a chronic ischemic infarct, stable. There has been interval evolution of previously identified infarct at the right basal ganglia, now largely chronic in appearance. Associated susceptibility artifact now seen at this location, consistent with petechial hemorrhage. Intrinsic T1 hyperintensity likely reflects associated mineralization. Few scattered remote bilateral cerebellar infarcts noted as well. Patchy foci of restricted diffusion are seen involving the posterior right basal ganglia, immediately adjacent to the area prior infarction (series 5, image 79). Mild extension into the adjacent right external capsule. Additional patchy involvement of the right periatrial white matter (series 5, image 75) as well as the right occipital cortex (series 5, image 74). No significant mass effect. Scattered foci of susceptibility artifact at the right temporal occipital region is new from previous, and could reflect associated petechial hemorrhage (series 18, image 27). Subtle patchy cortical involvement of the right frontal operculum noted as well (a series 5, image 77). No other evidence for acute or subacute ischemia. Gray-white matter differentiation otherwise maintained. No other acute intracranial hemorrhage. 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 No mass lesion, midline shift or mass effect. No hydrocephalus or extra-axial fluid collection. Pituitary gland suprasellar region normal. Midline structures intact. No abnormal enhancement. Pituitary gland and suprasellar region are normal. Midline  structures intact and normal. Vascular: Major intracranial vascular flow voids are maintained. Skull and upper cervical spine: Craniocervical junction within normal limits. Bone marrow signal intensity normal. No focal marrow replacing lesion. No scalp soft tissue abnormality. Sinuses/Orbits: Patient status post bilateral ocular lens replacement. Scattered mucosal thickening noted within the ethmoidal air cells. Paranasal sinuses are otherwise clear. No significant mastoid effusion. Inner ear structures grossly normal. Other: None. MRA HEAD FINDINGS ANTERIOR CIRCULATION: Visualized distal cervical segments of the internal carotid arteries are patent with antegrade flow. Petrous, cavernous, and supraclinoid segments patent without stenosis or other abnormality. A1 segments widely patent. Normal anterior communicating artery complex. Anterior cerebral arteries patent to their distal aspects without stenosis. Left M1 segment widely patent, with short-segment fenestration proximally. Normal left MCA bifurcation. Distal left MCA branches well perfused. Right M1 segment patent proximally. Severe stenosis involving the distal right M1 segment to the level of the bifurcation, stable from previous (series 1053, image 12). Right MCA branches demonstrate decreased perfusion but remain patent distally. Diffuse small vessel atheromatous irregularity seen about the MCA branches bilaterally. POSTERIOR CIRCULATION: Both V4 segments patent to the vertebrobasilar junction without stenosis. Left vertebral artery slightly dominant. Both PICA origins patent and normal. Basilar patent to its distal aspect without stenosis. Superior cerebellar arteries patent bilaterally. Left PCA supplied via the basilar. Fetal type origin of the right PCA. Both PCAs well perfused to their distal aspects without stenosis. No intracranial aneurysm. MRA NECK FINDINGS AORTIC ARCH: Examination technically limited by timing of the contrast bolus. Partially  visualized aortic arch grossly within normal limits for caliber with normal 3 vessel morphology. No hemodynamically significant stenosis seen about the origin of the great vessels. RIGHT CAROTID SYSTEM: Visualized right CCA patent from its origin to the bifurcation without stenosis. Mild atheromatous irregularity about the right carotid bulb/proximal right ICA with associated estimated stenosis of up to 30-40% stenosis by NASCET criteria. Right ICA patent distally without stenosis, evidence for dissection or occlusion. LEFT CAROTID SYSTEM: Left CCA patent from its origin to the bifurcation without stenosis. Mild atheromatous irregularity about the left carotid bulb/proximal  left ICA without hemodynamically significant stenosis. Left ICA patent distally without stenosis, evidence for dissection or occlusion. VERTEBRAL ARTERIES: Both vertebral arteries arise from the subclavian arteries. Left vertebral artery dominant. Origins of the vertebral arteries not well evaluated on this technically limited exam. Visualized portions of the vertebral arteries patent without stenosis, evidence for dissection or occlusion. IMPRESSION: MRI HEAD: 1. Patchy small volume acute ischemic infarcts involving the right basal ganglia, right periatrial white matter, and overlying right frontal and occipital cortices as above. Probable mild associated petechial hemorrhage at the right occipital region. No frank hemorrhagic transformation or mass effect. 2. Interval evolution of previously identified right basal ganglia infarct, now largely chronic in appearance. 3. Additional chronic left frontal infarct, with a few additional scattered remote bilateral cerebellar infarcts. 4. 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 MRA HEAD: 1. 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 2. No other proximal high-grade or correctable stenosis about the  major intracranial arterial circulation. 3. Diffuse small vessel atheromatous irregularity. MRA NECK: 1. Mild atheromatous irregularity about the carotid bifurcations/proximal ICAs bilaterally with estimated stenosis of up to 30-40% on the right. No significant stenosis about the left carotid artery system. 2. Widely patent vertebral arteries within the neck. Left vertebral artery dominant. Electronically Signed   By: Jeannine Boga M.D.   On: 08/21/2020 02:40   MR ANGIO NECK W WO CONTRAST  Result Date: 08/21/2020 CLINICAL DATA:  Initial evaluation for acute left-sided weakness, stroke. EXAM: MRI HEAD WITHOUT AND WITH CONTRAST MRA HEAD WITHOUT CONTRAST MRA NECK WITHOUT AND WITH CONTRAST TECHNIQUE: Multiplanar, multi-echo pulse sequences of the brain and surrounding structures were acquired without intravenous contrast. Angiographic images of the Circle of Willis were acquired using MRA technique without intravenous contrast. Angiographic images of the neck were acquired using MRA technique without and with intravenous contrast. Carotid stenosis measurements (when applicable) are obtained utilizing NASCET criteria, using the distal internal carotid diameter as the denominator. CONTRAST:  6.82mL GADAVIST GADOBUTROL 1 MMOL/ML IV SOLN COMPARISON:  Prior CT from 08/20/2020 and MRI from 06/30/2020. FINDINGS: MRI HEAD FINDINGS Brain: Diffuse prominence of the CSF containing spaces compatible with generalized cerebral atrophy. Encephalomalacia and gliosis involving the cortical and subcortical aspect of the anterior left frontal lobe consistent with a chronic ischemic infarct, stable. There has been interval evolution of previously identified infarct at the right basal ganglia, now largely chronic in appearance. Associated susceptibility artifact now seen at this location, consistent with petechial hemorrhage. Intrinsic T1 hyperintensity likely reflects associated mineralization. Few scattered remote bilateral  cerebellar infarcts noted as well. Patchy foci of restricted diffusion are seen involving the posterior right basal ganglia, immediately adjacent to the area prior infarction (series 5, image 79). Mild extension into the adjacent right external capsule. Additional patchy involvement of the right periatrial white matter (series 5, image 75) as well as the right occipital cortex (series 5, image 74). No significant mass effect. Scattered foci of susceptibility artifact at the right temporal occipital region is new from previous, and could reflect associated petechial hemorrhage (series 18, image 27). Subtle patchy cortical involvement of the right frontal operculum noted as well (a series 5, image 77). No other evidence for acute or subacute ischemia. Gray-white matter differentiation otherwise maintained. No other acute intracranial hemorrhage. 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 No mass lesion,  midline shift or mass effect. No hydrocephalus or extra-axial fluid collection. Pituitary gland suprasellar region normal. Midline structures intact. No abnormal enhancement. Pituitary gland and suprasellar region are normal. Midline structures intact and normal. Vascular: Major intracranial vascular flow voids are maintained. Skull and upper cervical spine: Craniocervical junction within normal limits. Bone marrow signal intensity normal. No focal marrow replacing lesion. No scalp soft tissue abnormality. Sinuses/Orbits: Patient status post bilateral ocular lens replacement. Scattered mucosal thickening noted within the ethmoidal air cells. Paranasal sinuses are otherwise clear. No significant mastoid effusion. Inner ear structures grossly normal. Other: None. MRA HEAD FINDINGS ANTERIOR CIRCULATION: Visualized distal cervical segments of the internal carotid arteries  are patent with antegrade flow. Petrous, cavernous, and supraclinoid segments patent without stenosis or other abnormality. A1 segments widely patent. Normal anterior communicating artery complex. Anterior cerebral arteries patent to their distal aspects without stenosis. Left M1 segment widely patent, with short-segment fenestration proximally. Normal left MCA bifurcation. Distal left MCA branches well perfused. Right M1 segment patent proximally. Severe stenosis involving the distal right M1 segment to the level of the bifurcation, stable from previous (series 1053, image 12). Right MCA branches demonstrate decreased perfusion but remain patent distally. Diffuse small vessel atheromatous irregularity seen about the MCA branches bilaterally. POSTERIOR CIRCULATION: Both V4 segments patent to the vertebrobasilar junction without stenosis. Left vertebral artery slightly dominant. Both PICA origins patent and normal. Basilar patent to its distal aspect without stenosis. Superior cerebellar arteries patent bilaterally. Left PCA supplied via the basilar. Fetal type origin of the right PCA. Both PCAs well perfused to their distal aspects without stenosis. No intracranial aneurysm. MRA NECK FINDINGS AORTIC ARCH: Examination technically limited by timing of the contrast bolus. Partially visualized aortic arch grossly within normal limits for caliber with normal 3 vessel morphology. No hemodynamically significant stenosis seen about the origin of the great vessels. RIGHT CAROTID SYSTEM: Visualized right CCA patent from its origin to the bifurcation without stenosis. Mild atheromatous irregularity about the right carotid bulb/proximal right ICA with associated estimated stenosis of up to 30-40% stenosis by NASCET criteria. Right ICA patent distally without stenosis, evidence for dissection or occlusion. LEFT CAROTID SYSTEM: Left CCA patent from its origin to the bifurcation without stenosis. Mild atheromatous irregularity  about the left carotid bulb/proximal left ICA without hemodynamically significant stenosis. Left ICA patent distally without stenosis, evidence for dissection or occlusion. VERTEBRAL ARTERIES: Both vertebral arteries arise from the subclavian arteries. Left vertebral artery dominant. Origins of the vertebral arteries not well evaluated on this technically limited exam. Visualized portions of the vertebral arteries patent without stenosis, evidence for dissection or occlusion. IMPRESSION: MRI HEAD: 1. Patchy small volume acute ischemic infarcts involving the right basal ganglia, right periatrial white matter, and overlying right frontal and occipital cortices as above. Probable mild associated petechial hemorrhage at the right occipital region. No frank hemorrhagic transformation or mass effect. 2. Interval evolution of previously identified right basal ganglia infarct, now largely chronic in appearance. 3. Additional chronic left frontal infarct, with a few additional scattered remote bilateral cerebellar infarcts. 4. 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 MRA HEAD: 1. 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 2. No other proximal high-grade or correctable stenosis about the major intracranial arterial circulation. 3. Diffuse small vessel atheromatous irregularity. MRA NECK: 1. Mild atheromatous irregularity about the carotid bifurcations/proximal ICAs bilaterally with estimated stenosis of up to 30-40% on the right. No significant stenosis about the left carotid  artery system. 2. Widely patent vertebral arteries within the neck. Left vertebral artery dominant. Electronically Signed   By: Jeannine Boga M.D.   On: 08/21/2020 02:40   MR BRAIN W WO CONTRAST  Result Date: 08/21/2020 CLINICAL DATA:  Initial evaluation for acute left-sided weakness, stroke. EXAM: MRI HEAD WITHOUT AND WITH CONTRAST MRA  HEAD WITHOUT CONTRAST MRA NECK WITHOUT AND WITH CONTRAST TECHNIQUE: Multiplanar, multi-echo pulse sequences of the brain and surrounding structures were acquired without intravenous contrast. Angiographic images of the Circle of Willis were acquired using MRA technique without intravenous contrast. Angiographic images of the neck were acquired using MRA technique without and with intravenous contrast. Carotid stenosis measurements (when applicable) are obtained utilizing NASCET criteria, using the distal internal carotid diameter as the denominator. CONTRAST:  6.22mL GADAVIST GADOBUTROL 1 MMOL/ML IV SOLN COMPARISON:  Prior CT from 08/20/2020 and MRI from 06/30/2020. FINDINGS: MRI HEAD FINDINGS Brain: Diffuse prominence of the CSF containing spaces compatible with generalized cerebral atrophy. Encephalomalacia and gliosis involving the cortical and subcortical aspect of the anterior left frontal lobe consistent with a chronic ischemic infarct, stable. There has been interval evolution of previously identified infarct at the right basal ganglia, now largely chronic in appearance. Associated susceptibility artifact now seen at this location, consistent with petechial hemorrhage. Intrinsic T1 hyperintensity likely reflects associated mineralization. Few scattered remote bilateral cerebellar infarcts noted as well. Patchy foci of restricted diffusion are seen involving the posterior right basal ganglia, immediately adjacent to the area prior infarction (series 5, image 79). Mild extension into the adjacent right external capsule. Additional patchy involvement of the right periatrial white matter (series 5, image 75) as well as the right occipital cortex (series 5, image 74). No significant mass effect. Scattered foci of susceptibility artifact at the right temporal occipital region is new from previous, and could reflect associated petechial hemorrhage (series 18, image 27). Subtle patchy cortical involvement of the right  frontal operculum noted as well (a series 5, image 77). No other evidence for acute or subacute ischemia. Gray-white matter differentiation otherwise maintained. No other acute intracranial hemorrhage. 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 No mass lesion, midline shift or mass effect. No hydrocephalus or extra-axial fluid collection. Pituitary gland suprasellar region normal. Midline structures intact. No abnormal enhancement. Pituitary gland and suprasellar region are normal. Midline structures intact and normal. Vascular: Major intracranial vascular flow voids are maintained. Skull and upper cervical spine: Craniocervical junction within normal limits. Bone marrow signal intensity normal. No focal marrow replacing lesion. No scalp soft tissue abnormality. Sinuses/Orbits: Patient status post bilateral ocular lens replacement. Scattered mucosal thickening noted within the ethmoidal air cells. Paranasal sinuses are otherwise clear. No significant mastoid effusion. Inner ear structures grossly normal. Other: None. MRA HEAD FINDINGS ANTERIOR CIRCULATION: Visualized distal cervical segments of the internal carotid arteries are patent with antegrade flow. Petrous, cavernous, and supraclinoid segments patent without stenosis or other abnormality. A1 segments widely patent. Normal anterior communicating artery complex. Anterior cerebral arteries patent to their distal aspects without stenosis. Left M1 segment widely patent, with short-segment fenestration proximally. Normal left MCA bifurcation. Distal left MCA branches well perfused. Right M1 segment patent proximally. Severe stenosis involving the distal right M1 segment to the level of the bifurcation, stable from previous (series 1053, image 12). Right MCA branches demonstrate decreased perfusion but remain patent  distally. Diffuse small vessel atheromatous irregularity seen  about the MCA branches bilaterally. POSTERIOR CIRCULATION: Both V4 segments patent to the vertebrobasilar junction without stenosis. Left vertebral artery slightly dominant. Both PICA origins patent and normal. Basilar patent to its distal aspect without stenosis. Superior cerebellar arteries patent bilaterally. Left PCA supplied via the basilar. Fetal type origin of the right PCA. Both PCAs well perfused to their distal aspects without stenosis. No intracranial aneurysm. MRA NECK FINDINGS AORTIC ARCH: Examination technically limited by timing of the contrast bolus. Partially visualized aortic arch grossly within normal limits for caliber with normal 3 vessel morphology. No hemodynamically significant stenosis seen about the origin of the great vessels. RIGHT CAROTID SYSTEM: Visualized right CCA patent from its origin to the bifurcation without stenosis. Mild atheromatous irregularity about the right carotid bulb/proximal right ICA with associated estimated stenosis of up to 30-40% stenosis by NASCET criteria. Right ICA patent distally without stenosis, evidence for dissection or occlusion. LEFT CAROTID SYSTEM: Left CCA patent from its origin to the bifurcation without stenosis. Mild atheromatous irregularity about the left carotid bulb/proximal left ICA without hemodynamically significant stenosis. Left ICA patent distally without stenosis, evidence for dissection or occlusion. VERTEBRAL ARTERIES: Both vertebral arteries arise from the subclavian arteries. Left vertebral artery dominant. Origins of the vertebral arteries not well evaluated on this technically limited exam. Visualized portions of the vertebral arteries patent without stenosis, evidence for dissection or occlusion. IMPRESSION: MRI HEAD: 1. Patchy small volume acute ischemic infarcts involving the right basal ganglia, right periatrial white matter, and overlying right frontal and occipital  cortices as above. Probable mild associated petechial hemorrhage at the right occipital region. No frank hemorrhagic transformation or mass effect. 2. Interval evolution of previously identified right basal ganglia infarct, now largely chronic in appearance. 3. Additional chronic left frontal infarct, with a few additional scattered remote bilateral cerebellar infarcts. 4. 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 MRA HEAD: 1. 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 2. No other proximal high-grade or correctable stenosis about the major intracranial arterial circulation. 3. Diffuse small vessel atheromatous irregularity. MRA NECK: 1. Mild atheromatous irregularity about the carotid bifurcations/proximal ICAs bilaterally with estimated stenosis of up to 30-40% on the right. No significant stenosis about the left carotid artery system. 2. Widely patent vertebral arteries within the neck. Left vertebral artery dominant. Electronically Signed   By: Jeannine Boga M.D.   On: 08/21/2020 02:40   CT HEAD CODE STROKE WO CONTRAST  Result Date: 08/20/2020 CLINICAL DATA:  Code stroke.  Weakness and facial droop EXAM: CT HEAD WITHOUT CONTRAST TECHNIQUE: Contiguous axial images were obtained from the base of the skull through the vertex without intravenous contrast. COMPARISON:  MRI 06/30/2020 FINDINGS: Brain: There is no acute intracranial hemorrhage, mass effect, or edema. No acute appearing loss of gray-white differentiation. Infarction of the right corona radiata and basal ganglia seen on prior MRI is identified. There is a chronic left frontal infarction involving the operculum. There may be a chronic cortical infarct of the posterior right opercular region. Additional patchy hypoattenuation in the supratentorial white matter is nonspecific but probably reflects chronic microvascular ischemic changes.  Prominence of the ventricles and sulci reflects generalized parenchymal volume loss. No extra-axial collection. Vascular: No hyperdense vessel. There are foci of calcification along the right M1 and M2 MCA. Skull: Unremarkable. Sinuses/Orbits: No acute finding. Other: Mastoid air cells are clear. ASPECTS [facility] Stroke Program Early CT Score) - Ganglionic level infarction (caudate, lentiform nuclei, internal capsule, insula,  M1-M3 cortex): 7 - Supraganglionic infarction (M4-M6 cortex): 3 Total score (0-10 with 10 being normal): 10 IMPRESSION: There is no acute intracranial hemorrhage or evidence of acute infarction. ASPECT score is 10. Chronic infarcts as described. Foci of calcification along the right M1 and M2 MCA. Could reflect calcified emboli. Right M1 MCA irregularity on the prior MRA is likely related. These results were communicated to Dr. Lorrin Goodell at 7:18 pm on 08/20/2020 by text page via the [facility] messaging system. Electronically Signed   By: Macy Mis M.D.   On: 08/20/2020 19:22      Scheduled Meds:  ALPRAZolam  0.5 mg Oral TID   aspirin  162 mg Oral Daily   atorvastatin  40 mg Oral Daily   enoxaparin (LOVENOX) injection  40 mg Subcutaneous Q24H   feeding supplement  237 mL Oral BID BM   folic acid  1 mg Oral Daily   multivitamin with minerals  1 tablet Oral Daily   nystatin  5 mL Mouth/Throat QID   thiamine  100 mg Oral Daily   Or   thiamine  100 mg Intravenous Daily   Continuous Infusions:   LOS: 2 days      Debbe Odea, MD [facility] Pager: [email or web address] 08/22/2020, 10:00 AM

## 2020-08-22 NOTE — Plan of Care (Signed)
  Problem: Education: Goal: Knowledge of General Education information will improve Description: Including pain rating scale, medication(s)/side effects and non-pharmacologic comfort measures Outcome: Progressing   Problem: Health Behavior/Discharge Planning: Goal: Ability to manage health-related needs will improve Outcome: Progressing   Problem: Clinical Measurements: Goal: Ability to maintain clinical measurements within normal limits will improve Outcome: Progressing Goal: Will remain free from infection Outcome: Progressing Goal: Diagnostic test results will improve Outcome: Progressing Goal: Respiratory complications will improve Outcome: Progressing Goal: Cardiovascular complication will be avoided Outcome: Progressing   Problem: Activity: Goal: Risk for activity intolerance will decrease Outcome: Progressing   Problem: Nutrition: Goal: Adequate nutrition will be maintained Outcome: Progressing   Problem: Coping: Goal: Level of anxiety will decrease Outcome: Progressing   Problem: Elimination: Goal: Will not experience complications related to bowel motility Outcome: Progressing Goal: Will not experience complications related to urinary retention Outcome: Progressing   Problem: Pain Managment: Goal: General experience of comfort will improve Outcome: Progressing   Problem: Safety: Goal: Ability to remain free from injury will improve Outcome: Progressing   Problem: Skin Integrity: Goal: Risk for impaired skin integrity will decrease Outcome: Progressing   Problem: Education: Goal: Knowledge of disease or condition will improve Outcome: Progressing Goal: Knowledge of secondary prevention will improve Outcome: Progressing Goal: Knowledge of patient specific risk factors addressed and post discharge goals established will improve Outcome: Progressing Goal: Individualized Educational Video(s) Outcome: Progressing   Problem: Coping: Goal: Will verbalize  positive feelings about self Outcome: Progressing   Problem: Health Behavior/Discharge Planning: Goal: Ability to manage health-related needs will improve Outcome: Progressing   Problem: Self-Care: Goal: Ability to participate in self-care as condition permits will improve Outcome: Progressing Goal: Verbalization of feelings and concerns over difficulty with self-care will improve Outcome: Progressing Goal: Ability to communicate needs accurately will improve Outcome: Progressing   Problem: Nutrition: Goal: Risk of aspiration will decrease Outcome: Progressing   Problem: Ischemic Stroke/TIA Tissue Perfusion: Goal: Complications of ischemic stroke/TIA will be minimized Outcome: Progressing   

## 2020-08-22 NOTE — Progress Notes (Signed)
Daughter Shelbie Proctor states pt does not have a CPAP or BiPAP at home.

## 2020-08-22 NOTE — Progress Notes (Addendum)
STROKE TEAM PROGRESS NOTE   INTERVAL HISTORY  No acute events overnight. Sitting up in the chair.  Afebrile.  Vital signs stable.  Neuro exam unchanged  Vitals:   08/21/20 2003 08/22/20 0025 08/22/20 0350 08/22/20 0749  BP: 114/69 109/69 112/64 111/68  Pulse: 96 86 87 91  Resp: _0 Temp: 98.5 F (36.9 C) 98.3 F (36.8 C) 98.7 F (37.1 C) (!) 97.5 F (36.4 C)  TempSrc: Oral Oral  Oral  SpO2: 99% 94% 93% 100%  Weight:      Height:       CBC:  Recent Labs  Lab 08/17/20 0735 08/17/20 0735 08/20/20 1901 08/20/20 2016 08/22/20 0843  WBC 4.2  --  6.6  --  5.3  NEUTROABS 3.0  --  5.4  --   --   HGB 11.4*   < > 11.5* 13.9 11.5*  HCT 37.0*  --  36.8* 41.0 36.3*  MCV 93.2  --  92.7  --  93.1  PLT 302  --  316  --  318   < > = values in this interval not displayed.   Basic Metabolic Panel:  Recent Labs  Lab 08/20/20 1901 08/20/20 2016 08/21/20 0141 08/22/20 0843  NA 138 140  --  137  K 3.8 3.6  --  3.6  CL 101 103  --  104  CO2 25  --   --  26  GLUCOSE 118* 109*  --  108*  BUN 9 11  --  13  CREATININE 0.51* 0.40*  --  0.45*  CALCIUM 9.1  --   --  9.0  MG  --   --  1.9  --   PHOS  --   --  3.0  --    Lipid Panel:  Recent Labs  Lab 08/21/20 0141  CHOL 141  TRIG 88  HDL 27*  CHOLHDL 5.2  VLDL 18  LDLCALC 96   HgbA1c: No results for input(s): HGBA1C in the last 168 hours. Urine Drug Screen: No results for input(s): LABOPIA, COCAINSCRNUR, LABBENZ, AMPHETMU, THCU, LABBARB in the last 168 hours.    IMAGING past 24 hours  CT head Result read: 08/20/2020 Impression: There is no acute intracranial hemorrhage or evidence of acute infarction. ASPECT score is 10.   Chronic infarcts as described.  Foci of calcification along the right M1 and M2 MCA. Could reflect calcified emboli. Right M1 MCA irregularity on the prior MRA is likely related.   MRI HEAD: Result read: 08/21/2020 Impression: 1. Patchy small volume acute ischemic infarcts involving the  right basal ganglia, right periatrial white matter, and overlying right frontal and occipital cortices. Probable mild associated petechial hemorrhage at the right occipital region. No frank hemorrhagic transformation or mass effect. 2. Interval evolution of previously identified right basal ganglia infarct, now largely chronic in appearance. 3. Additional chronic left frontal infarct, with a few additional scattered remote bilateral cerebellar infarcts. 4. 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   MRA HEAD  Result read: 08/21/2020 Impression: 1. 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 2. No other proximal high-grade or correctable stenosis about the major intracranial arterial circulation. 3. Diffuse small vessel atheromatous irregularity.   MRA NECK: Result read: 08/21/2020 Impression: 1. Mild atheromatous irregularity about the carotid bifurcations/proximal ICAs bilaterally with estimated stenosis of up to 30-40% on the right. No significant stenosis about the left carotid artery system. 2. Widely patent  vertebral arteries within the neck. Left vertebral artery dominant.  Echo Result read: 08/22/2020 Impression  1. Left ventricular ejection fraction, by estimation, is 60 to 65%. The  left ventricle has normal function. The left ventricle has no regional  wall motion abnormalities. Left ventricular diastolic parameters are  consistent with Grade I diastolic  dysfunction (impaired relaxation).   2. Right ventricular systolic function is mildly reduced. The right  ventricular size is normal. Tricuspid regurgitation signal is inadequate  for assessing PA pressure.   3. There is a large density on the mitral valve annulus that is most  consistent with severe mitral annular calcification. . The mitral valve is  degenerative. No evidence of mitral valve regurgitation. No  evidence of  mitral stenosis. Severe mitral  annular calcification.   4. There is a large density on the AV that is likely eccentric calcific  deegeneration of the aortic valve but cannot rule out mass. In the setting  of CVA, recommend TEE to better define the aortic valve. . The aortic  valve is calcified. There is severe  calcifcation of the aortic valve. There is severe thickening of the aortic  valve. Aortic valve regurgitation is moderate. No aortic stenosis is  present. Aortic regurgitation PHT measures 359 msec. Aortic valve area, by  VTI measures 3.77 cm. Aortic valve   mean gradient measures 5.0 mmHg. Aortic valve Vmax measures 1.49 m/s.   5. Aortic dilatation noted. There is mild dilatation of the aortic root,  measuring 41 mm.   6. The inferior vena cava is normal in size with greater than 50%  respiratory variability, suggesting right atrial pressure of 3 mmHg.     PHYSICAL EXAM General: Laying comfortably in bed; in no acute distress. HENT: Normal oropharynx and mucosa. Normal external appearance of ears and nose. Neck: Supple, no pain or tenderness CV: No JVD. No peripheral edema. Pulmonary: Symmetric Chest rise. Normal respiratory effort. Abdomen: Soft to touch, non-tender. Ext: No cyanosis, edema, or deformity  Skin: No rash. Normal palpation of skin.    Mental status/Cognition: Alert, oriented to self, place, month and year, good attention. Speech/language: mildly dysarthric speech, fluent, comprehension intact, object naming intact, repetition intact. Cranial nerves:   CN II Pupils equal and reactive to light, no VF deficits   CN III,IV,VI EOM intact, no gaze preference or deviation, no nystagmus   CN V normal sensation in V1, V2, and V3 segments bilaterally   CN VII Mild L facial droop   CN VIII normal hearing to speech   CN IX & X normal palatal elevation, no uvular deviation   CN XI 5/5 head turn and 5/5 shoulder shrug bilaterally   CN XII midline tongue  protrusion   Motor: Upper right extremity 5/5, right lower extremity 5/5, Left upper extremity 2/5, hand grip 2/5. Left lower extremity 4-/5.  Tone and bulk:normal tone throughout; no atrophy noted Sensory: Pinprick and light touch intact throughout, bilaterally Cerebellar: normal finger-to-nose intact on right normal rapid alternating movements slowed and normal heel-to-shin test Gait: deferred   ASSESSMENT/PLAN Cody Douglas is a 70 y.o. male with history of HTN, prostate cancer, stage 3 lung cancer on chemo and radiation, prior smoker, prior external capsule and caudate infarct who presents with left sided weakness, left facial droop, and dysarthria.  He did not receive tPA because he was outside the window.   Stroke:  right basal ganglia, right parietal white matter and frontal and occipital cortices infarcts in the setting of severe  distal right M1 stenosis. Code Stroke: no acute intracranial hemorrhage or evidence of acute       infarction. ASPECT score is 10. MRI  Patchy small volume acute ischemic infarcts involving the right       basal ganglia, right periatrial white matter, and overlying right frontal and occipital cortices MRA head/neck severe distal right M1 stenosis with diminished perfusion      distally, stable from previous. 2D Echo EF 60-65%. No wall motion abnormality. Grade I diastolic dysfunction.  large density on the mitral valve annulus.  There is large density on the AV that is likely eccentric calcific degeneration of the aortic valve but cannot rule out mass.  We recommend further evaluation with a TEE. LDL 96 HgbA1c 6.3 VTE prophylaxis - lovenox Diet: heart healthy aspirin 81 mg daily prior to admission, now on aspirin 81 mg daily. Previously on DAPT with ASA 359m and Plavix 71mbut he developed hemoptysis and hematuria.  Recommend increase aspirin to 162 mg daily and monitor signs of bleeding. If no signs of bleeding and hemoglobin remains stable would  recommend DAPT with ASA [id number] Plavix 24m21mr 3 months then monotherapy with ASA [id number] recommendations:  SNF Disposition:  pending Will sign off for now.  If TEE suggests vegetations please call for further recommendations.  Hx stroke:  07/02/20 he was found to have right BG infarct with high grade stenosis of right M1. Recommended ASA 325 and plavix 75 DAPT for 3 months given the intracranial stenosis. However, pt was found to have hemoptysis, DAPT was on hold. Placed on ASA 325mg 40m for lower bleeding risk.  On 5/2 he developed hematuria, aspirin was then reduced to 24mg  48mrtension Home meds:  Toprol XL 25mg [id number] Avoid low BP Long term BP goal 130-150 given high-grade right MCA stenosis  Mitral valve abnormality 07/01/2020: 2D Echo EF 55 to 60%.  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  Echo pending  Hyperlipidemia Home meds:  none LDL 96, goal < 70 Atorvastatin 40mg [id number] statin at discharge  Diabetes type II controlled Home meds:  none HgbA1c 6.3, goal < 7.0 CBGs Recent Labs    08/20/20 1902  GLUCAP 106*    SSI   Tobacco abuse Current smoker Smoking cessation counseling provided Pt is willing to quit   Alcohol abuse Heavy alcohol in the past, 1 bottle of vodka once a week Alcohol limitation education provided Recommend CIWA protocol Add B1/MVI/FA  Other Stroke Risk Factors Advanced Age >/= 65  Cigar61te smoker advised to stop smoking Migraines  Other Active Problems GERD Prostate Cancer NSCLC stage 3: currently receiving chemo and radiation therapy Anxiety: xanax 0.5mg TID  74mpital day # 2  Cody Douglas, ACNP-BC Stroke NP  To contact Stroke Continuity  provider, please refer to [email or web address], contact General Neurology

## 2020-08-22 NOTE — Progress Notes (Signed)
RT went to pts room to see if he wanted to wear cpap for the night, pt stated he did not, RT explained benefits of wearing cpap pt still refused, RN aware at this time.

## 2020-08-22 NOTE — Progress Notes (Signed)
  Echocardiogram 2D Echocardiogram has been performed.  Cody Douglas 08/22/2020, 3:17 PM

## 2020-08-23 ENCOUNTER — Inpatient Hospital Stay: Payer: Medicare Other

## 2020-08-23 ENCOUNTER — Ambulatory Visit: Payer: Medicare Other

## 2020-08-23 DIAGNOSIS — I058 Other rheumatic mitral valve diseases: Secondary | ICD-10-CM | POA: Diagnosis not present

## 2020-08-23 DIAGNOSIS — C3411 Malignant neoplasm of upper lobe, right bronchus or lung: Secondary | ICD-10-CM | POA: Diagnosis not present

## 2020-08-23 DIAGNOSIS — I10 Essential (primary) hypertension: Secondary | ICD-10-CM | POA: Diagnosis not present

## 2020-08-23 DIAGNOSIS — B370 Candidal stomatitis: Secondary | ICD-10-CM | POA: Diagnosis not present

## 2020-08-23 LAB — RESP PANEL BY RT-PCR (FLU A&B, COVID) ARPGX2
Influenza A by PCR: NEGATIVE
Influenza B by PCR: NEGATIVE
SARS Coronavirus 2 by RT PCR: NEGATIVE

## 2020-08-23 LAB — HEMOGLOBIN A1C
Hgb A1c MFr Bld: 6 % — ABNORMAL HIGH (ref 4.8–5.6)
Mean Plasma Glucose: 126 mg/dL

## 2020-08-23 LAB — COMPREHENSIVE METABOLIC PANEL
ALT: 12 U/L (ref 0–44)
AST: 14 U/L — ABNORMAL LOW (ref 15–41)
Albumin: 2.8 g/dL — ABNORMAL LOW (ref 3.5–5.0)
Alkaline Phosphatase: 86 U/L (ref 38–126)
Anion gap: 5 (ref 5–15)
BUN: 13 mg/dL (ref 8–23)
CO2: 28 mmol/L (ref 22–32)
Calcium: 8.8 mg/dL — ABNORMAL LOW (ref 8.9–10.3)
Chloride: 105 mmol/L (ref 98–111)
Creatinine, Ser: 0.57 mg/dL — ABNORMAL LOW (ref 0.61–1.24)
GFR, Estimated: >60 mL/min (ref >60)
Glucose, Bld: 119 mg/dL — ABNORMAL HIGH (ref 70–99)
Potassium: 4.1 mmol/L (ref 3.5–5.1)
Sodium: 138 mmol/L (ref 135–145)
Total Bilirubin: 0.4 mg/dL (ref 0.3–1.2)
Total Protein: 5.7 g/dL — ABNORMAL LOW (ref 6.5–8.1)

## 2020-08-23 LAB — GLUCOSE, CAPILLARY: Glucose-Capillary: 113 mg/dL — ABNORMAL HIGH (ref 70–99)

## 2020-08-23 LAB — MAGNESIUM: Magnesium: 1.9 mg/dL (ref 1.7–2.4)

## 2020-08-23 MED ORDER — SODIUM CHLORIDE 0.9 % IV SOLN
INTRAVENOUS | Status: DC
  Administered 2020-08-24: 500 mL via INTRAVENOUS

## 2020-08-23 MED ORDER — METOPROLOL SUCCINATE ER 25 MG PO TB24
25.0000 mg | ORAL_TABLET | Freq: Every day | ORAL | Status: DC
  Administered 2020-08-23 – 2020-08-25 (×3): 25 mg via ORAL
  Filled 2020-08-23 (×3): qty 1

## 2020-08-23 MED ORDER — BISACODYL 5 MG PO TBEC
5.0000 mg | DELAYED_RELEASE_TABLET | Freq: Every day | ORAL | Status: DC | PRN
  Administered 2020-08-23 – 2020-08-26 (×2): 5 mg via ORAL
  Filled 2020-08-23 (×2): qty 1

## 2020-08-23 MED ORDER — BISACODYL 10 MG RE SUPP: 10.0000 mg | Freq: Every day | RECTAL | Status: DC | PRN

## 2020-08-23 MED ORDER — POLYETHYLENE GLYCOL 3350 17 G PO PACK
17.0000 g | PACK | Freq: Every day | ORAL | Status: DC
  Administered 2020-08-23 – 2020-08-30 (×6): 17 g via ORAL
  Filled 2020-08-23 (×7): qty 1

## 2020-08-23 NOTE — H&P (View-Only) (Signed)
PROGRESS NOTE    Cody Douglas   RSW:[id number]  DOB: [birth year]  DOA: 08/20/2020 PCP: Tamsen Roers, MD   Brief Narrative:  Cody Douglas is a 70 year old male who presents from [facility] who has non-small cell lung cancer and is on chemo and radiation,  hypertension,  nicotine abuse, alcohol abuse, prostate cancer who presents for worsening left-sided weakness. He was last hospitalized from 06/30/2020 through 07/15/2020 with an acute right-sided CVA with left-sided weakness and transferred to SNF for rehab. The head in the ED showed no acute infarcts  Subjective: His left sided weakness is the same as yesterday. No hemoptysis.    Assessment & Plan:   Principal Problem:   Acute ischemic stroke, multifocal  -Worsening weakness of his left arm and leg-there appears to be a left facial droop on exam but when asked to smile, his smile is equal -6/11 > MRI reveals patchy acute ischemic infarcts in the right basal ganglia, right parietal white matter, right frontal and occipital lobes and probable mild associated petechial hemorrhage in the right occipital region.  He has chronic infarcts in the right basal ganglia, left frontal and some scattered remote bilateral cerebral infarcts.  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 - MRA head again reveals severe distal right M1 stenosis -MRA neck does not show any significant stenosis -He was initially placed on aspirin and Plavix when he was last here with the CVA but due to hemoptysis, Plavix was held and he was continued on aspirin 325 mg.  On 5/2 due to hematuria, aspirin was reduced to 81 mg daily.  He is currently having no hemoptysis or hematuria -Also noted on TEE on 5/2 was a small mass on the aortic valve (cannot rule out vegetation) and the right cusp appeared to be perforated with mild to moderate AI -2 D ECHO> There is a large density on the AV that is  likely eccentric calcific  deegeneration of the aortic valve but cannot rule out mass. In the setting  of CVA, recommend TEE to better define the aortic valve. 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    Time spent in minutes: 35 DVT prophylaxis: enoxaparin (LOVENOX) injection 40 mg Start: 08/20/20 2045  Code Status: DO NOT RESUSCITATE Family Communication:  Level of Care: Level of care: Telemetry Medical Disposition Plan:  Status is: Inpatient  Remains inpatient appropriate because:Inpatient level of care appropriate due to severity of illness  Dispo: The patient is from: SNF              Anticipated d/c is to: SNF              Patient currently is not medically stable  to d/c.   Difficult to place patient No      Consultants:  Neurology Procedures:  None Antimicrobials:  Anti-infectives (From admission, onward)    None        Objective: Vitals:   08/22/20 1700 08/22/20 2007 08/22/20 2330 08/23/20 0429  BP: 131/73 113/66 102/60 122/70  Pulse: 100 92 100 83  Resp: 18 20 18 20   Temp: 97.9 F (36.6 C) 98.3 F (36.8 C) 98.6 F (37 C) 97.7 F (36.5 C)  TempSrc: Oral Oral Oral Oral  SpO2: 95% 96% 94% 100%  Weight:      Height:        Intake/Output  Summary (Last 24 hours) at 08/23/2020 0758 Last data filed at 08/22/2020 1832 Gross per 24 hour  Intake 240 ml  Output 875 ml  Net -635 ml    Filed Weights   08/20/20 1859  Weight: 64.5 kg    Examination: General exam: Appears comfortable  HEENT: PERRLA, oral mucosa moist, no sclera icterus or thrush Respiratory system: Clear to auscultation. Respiratory effort normal. Cardiovascular system: S1 & S2 heard, regular rate and rhythm Gastrointestinal system: Abdomen soft, non-tender, nondistended. Normal bowel sounds   Central nervous system: Alert and oriented. LEft arm 0/5 and flaccid, left leg 3-4/5- left facial droop Extremities: No cyanosis, clubbing or edema Skin: No rashes or ulcers Psychiatry:  Mood & affect appropriate.      Data Reviewed: I have personally reviewed following labs and imaging studies  CBC: Recent Labs  Lab 08/17/20 0735 08/20/20 1901 08/20/20 2016 08/22/20 0843  WBC 4.2 6.6  --  5.3  NEUTROABS 3.0 5.4  --   --   HGB 11.4* 11.5* 13.9 11.5*  HCT 37.0* 36.8* 41.0 36.3*  MCV 93.2 92.7  --  93.1  PLT 302 316  --  732    Basic Metabolic Panel: Recent Labs  Lab 08/17/20 0735 08/20/20 1901 08/20/20 2016 08/21/20 0141 08/22/20 0843 08/23/20 0337  NA 142 138 140  --  137 138  K 4.0 3.8 3.6  --  3.6 4.1  CL 106 101 103  --  104 105  CO2 24 25  --   --  26 28  GLUCOSE 109* 118* 109*  --  108* 119*  BUN 13 9 11   --  13 13  CREATININE 0.60* 0.51* 0.40*  --  0.45* 0.57*  CALCIUM 9.5 9.1  --   --  9.0 8.8*  MG  --   --   --  1.9  --  1.9  PHOS  --   --   --  3.0  --   --     GFR: Estimated Creatinine Clearance: 77.3 mL/min (A) (by C-G formula based on SCr of 0.57 mg/dL (L)). Liver Function Tests: Recent Labs  Lab 08/17/20 0735 08/20/20 1901 08/22/20 0843 08/23/20 0337  AST 13* 18 16 14*  ALT 12 15 14 12   ALKPHOS 106 93 99 86  BILITOT 0.4 0.8 0.7 0.4  PROT 6.8 6.5 6.0* 5.7*  ALBUMIN 3.1* 3.3* 3.0* 2.8*    No results for input(s):  LIPASE, AMYLASE in the last 168 hours. No results for input(s): AMMONIA in the last 168 hours. Coagulation Profile: Recent Labs  Lab 08/20/20 1901  INR 1.0    Cardiac Enzymes: No results for input(s): CKTOTAL, CKMB, CKMBINDEX, TROPONINI in the last 168 hours. BNP (last 3 results) No results for input(s): PROBNP in the last 8760 hours. HbA1C: No results for input(s): HGBA1C in  the last 72 hours. CBG: Recent Labs  Lab 08/20/20 1902  GLUCAP 106*    Lipid Profile: Recent Labs    08/21/20 0141  CHOL 141  HDL 27*  LDLCALC 96  TRIG 88  CHOLHDL 5.2    Thyroid Function Tests: No results for input(s): TSH, T4TOTAL, FREET4, T3FREE, THYROIDAB in the last 72 hours. Anemia Panel: No results for input(s): VITAMINB12, FOLATE, FERRITIN, TIBC, IRON, RETICCTPCT in the last 72 hours. Urine analysis:    Component Value Date/Time   COLORURINE RED (A) 07/13/2020 1901   APPEARANCEUR TURBID (A) 07/13/2020 1901   LABSPEC  07/13/2020 1901    TEST NOT REPORTED DUE TO COLOR INTERFERENCE OF URINE PIGMENT   PHURINE  07/13/2020 1901    TEST NOT REPORTED DUE TO COLOR INTERFERENCE OF URINE PIGMENT   GLUCOSEU (A) 07/13/2020 1901    TEST NOT REPORTED DUE TO COLOR INTERFERENCE OF URINE PIGMENT   HGBUR (A) 07/13/2020 1901    TEST NOT REPORTED DUE TO COLOR INTERFERENCE OF URINE PIGMENT   BILIRUBINUR (A) 07/13/2020 1901    TEST NOT REPORTED DUE TO COLOR INTERFERENCE OF URINE PIGMENT   KETONESUR (A) 07/13/2020 1901    TEST NOT REPORTED DUE TO COLOR INTERFERENCE OF URINE PIGMENT   PROTEINUR (A) 07/13/2020 1901    TEST NOT REPORTED DUE TO COLOR INTERFERENCE OF URINE PIGMENT   UROBILINOGEN 2.0 (H) 12/13/2013 0738   NITRITE (A) 07/13/2020 1901    TEST NOT REPORTED DUE TO COLOR INTERFERENCE OF URINE PIGMENT   LEUKOCYTESUR (A) 07/13/2020 1901    TEST NOT REPORTED DUE TO COLOR INTERFERENCE OF URINE PIGMENT   Sepsis Labs: @LABRCNTIP (procalcitonin:4,lacticidven:4) ) Recent Results (from the past 240  hour(s))  SARS CORONAVIRUS 2 (TAT 6-24 HRS) Nasopharyngeal Nasopharyngeal Swab     Status: None   Collection Time: 08/20/20  9:36 PM   Specimen: Nasopharyngeal Swab  Result Value Ref Range Status   SARS Coronavirus 2 NEGATIVE NEGATIVE Final    Comment: (NOTE) SARS-CoV-2 target nucleic acids are NOT DETECTED.  The SARS-CoV-2 RNA is generally detectable in upper and lower respiratory specimens during the acute phase of infection. Negative results do not preclude SARS-CoV-2 infection, do not rule out co-infections with other pathogens, and should not be used as the sole basis for treatment or other patient management decisions. Negative results must be combined with clinical observations, patient history, and epidemiological information. The expected result is Negative.  Fact Sheet for Patients: SugarRoll.be  Fact Sheet for Healthcare Providers: https://www.woods-mathews.com/  This test is not yet approved or cleared by the [place] FDA and  has been authorized for detection and/or diagnosis of SARS-CoV-2 by FDA under an Emergency Use Authorization (EUA). This EUA will remain  in effect (meaning this test can be used) for the duration of the COVID-19 declaration under Se ction 564(b)(1) of the Act, 21 U.S.C. section 360bbb-3(b)(1), unless the authorization is terminated or revoked sooner.  Performed at [facility], [place] [address] [place], [place] [address]          Radiology Studies: ECHOCARDIOGRAM COMPLETE  Result Date: 08/22/2020    ECHOCARDIOGRAM REPORT   Patient Name:   Cody Douglas Date of Exam: 08/22/2020 Medical Rec #:  [id number]      Height:       71.0 in Accession #:    [id number]     Weight:       142.1 lb Date of Birth:  1950/10/04      BSA:  1.823 m Patient Age:    89 years       BP:           111/68 mmHg Patient Gender: M              HR:           96 bpm. Exam Location:  Inpatient Procedure: 2D Echo,  Cardiac Doppler and Color Doppler Indications:    Stroke                 MV disorder  History:        Patient has prior history of Echocardiogram examinations, most                 recent 07/12/2020. Risk Factors:Hypertension and Current Smoker.                 Prostate cancer. ETOH abuse.  Sonographer:    Clayton Lefort RDCS (AE) Referring Phys: [place]  1. Left ventricular ejection fraction, by estimation, is 60 to 65%. The left ventricle has normal function. The left ventricle has no regional wall motion abnormalities. Left ventricular diastolic parameters are consistent with Grade I diastolic dysfunction (impaired relaxation).  2. Right ventricular systolic function is mildly reduced. The right ventricular size is normal. Tricuspid regurgitation signal is inadequate for assessing PA pressure.  3. There is a large density on the mitral valve annulus that is most consistent with severe mitral annular calcification. . The mitral valve is degenerative. No evidence of mitral valve regurgitation. No evidence of mitral stenosis. Severe mitral annular calcification.  4. There is a large density on the AV that is likely eccentric calcific deegeneration of the aortic valve but cannot rule out mass. In the setting of CVA, recommend TEE to better define the aortic valve. . The aortic valve is calcified. There is severe calcifcation of the aortic valve. There is severe thickening of the aortic valve. Aortic valve regurgitation is moderate. No aortic stenosis is present. Aortic regurgitation PHT measures 359 msec. Aortic valve area, by VTI measures 3.77 cm. Aortic valve  mean gradient measures 5.0 mmHg. Aortic valve Vmax measures 1.49 m/s.  5. Aortic dilatation noted. There is mild dilatation of the aortic root, measuring 41 mm.  6. The inferior vena cava is normal in size with greater than 50% respiratory variability, suggesting right atrial pressure of 3 mmHg. FINDINGS  Left Ventricle: Left ventricular  ejection fraction, by estimation, is 60 to 65%. The left ventricle has normal function. The left ventricle has no regional wall motion abnormalities. The left ventricular internal cavity size was normal in size. There is  no left ventricular hypertrophy. Left ventricular diastolic parameters are consistent with Grade I diastolic dysfunction (impaired relaxation). Normal left ventricular filling pressure. Right Ventricle: The right ventricular size is normal. No increase in right ventricular wall thickness. Right ventricular systolic function is mildly reduced. Tricuspid regurgitation signal is inadequate for assessing PA pressure. Left Atrium: Left atrial size was normal in size. Right Atrium: Right atrial size was normal in size. Pericardium: There is no evidence of pericardial effusion. Mitral Valve: There is a large density on the mitral valve annulus that is most consistent with severe mitral annular calcification. The mitral valve is degenerative in appearance. There is moderate thickening of the anterior mitral valve leaflet(s). There is moderate calcification of the anterior mitral valve leaflet(s). Severe mitral annular calcification. No evidence of mitral valve regurgitation. No evidence of mitral valve stenosis. Tricuspid Valve:  The tricuspid valve is normal in structure. Tricuspid valve regurgitation is trivial. No evidence of tricuspid stenosis. Aortic Valve: There is a large density on the AV that is likely eccentric calcific deegeneration of the aortic valve but cannot rule out mass. In the setting of CVA, recommend TEE to better define the aortic valve. The aortic valve is calcified. There is  severe calcifcation of the aortic valve. There is severe thickening of the aortic valve. Aortic valve regurgitation is moderate. Aortic regurgitation PHT measures 359 msec. No aortic stenosis is present. Aortic valve mean gradient measures 5.0 mmHg. Aortic valve peak gradient measures 8.9 mmHg. Aortic valve  area, by VTI measures 3.77 cm. Pulmonic Valve: The pulmonic valve was normal in structure. Pulmonic valve regurgitation is not visualized. No evidence of pulmonic stenosis. Aorta: Aortic dilatation noted. There is mild dilatation of the aortic root, measuring 41 mm. Venous: The inferior vena cava is normal in size with greater than 50% respiratory variability, suggesting right atrial pressure of 3 mmHg. IAS/Shunts: No atrial level shunt detected by color flow Doppler.  LEFT VENTRICLE PLAX 2D LVIDd:         4.40 cm  Diastology LVIDs:         3.10 cm  LV e' medial:    6.42 cm/s LV PW:         1.00 cm  LV E/e' medial:  10.2 LV IVS:        1.10 cm  LV e' lateral:   10.10 cm/s LVOT diam:     2.30 cm  LV E/e' lateral: 6.5 LV SV:         91 LV SV Index:   50 LVOT Area:     4.15 cm  RIGHT VENTRICLE             IVC RV Basal diam:  2.50 cm     IVC diam: 1.80 cm RV S prime:     12.00 cm/s TAPSE (M-mode): 1.6 cm LEFT ATRIUM             Index       RIGHT ATRIUM           Index LA diam:        3.10 cm 1.70 cm/m  RA Area:     13.40 cm LA Vol (A2C):   28.6 ml 15.68 ml/m RA Volume:   31.20 ml  17.11 ml/m LA Vol (A4C):   26.9 ml 14.75 ml/m LA Biplane Vol: 30.5 ml 16.73 ml/m  AORTIC VALVE AV Area (Vmax):    3.43 cm AV Area (Vmean):   3.20 cm AV Area (VTI):     3.77 cm AV Vmax:           149.00 cm/s AV Vmean:          111.000 cm/s AV VTI:            0.240 m AV Peak Grad:      8.9 mmHg AV Mean Grad:      5.0 mmHg LVOT Vmax:         123.00 cm/s LVOT Vmean:        85.400 cm/s LVOT VTI:          0.218 m LVOT/AV VTI ratio: 0.91 AI PHT:            359 msec  AORTA Ao Root diam: 4.10 cm MITRAL VALVE MV Area (PHT): 4.29 cm     SHUNTS MV Decel Time: 177 msec  Systemic VTI:  0.22 m MV E velocity: 65.60 cm/s   Systemic Diam: 2.30 cm MV A velocity: 108.00 cm/s MV E/A ratio:  0.61 Fransico Him MD Electronically signed by Fransico Him MD Signature Date/Time: 08/22/2020/3:25:05 PM    Final       Scheduled Meds:  ALPRAZolam  0.5 mg  Oral TID   aspirin  162 mg Oral Daily   atorvastatin  40 mg Oral Daily   enoxaparin (LOVENOX) injection  40 mg Subcutaneous Q24H   feeding supplement  237 mL Oral BID BM   folic acid  1 mg Oral Daily   multivitamin with minerals  1 tablet Oral Daily   nystatin  5 mL Mouth/Throat QID   thiamine  100 mg Oral Daily   Or   thiamine  100 mg Intravenous Daily   Continuous Infusions:   LOS: 3 days      Debbe Odea, MD [facility] Pager: [email or web address] 08/23/2020, 7:58 AM

## 2020-08-23 NOTE — Progress Notes (Signed)
PROGRESS NOTE    Cody Douglas   YBO:[id number]  DOB: [birth year]  DOA: 08/20/2020 PCP: Tamsen Roers, MD   Brief Narrative:  Cody Douglas is a 70 year old male who presents from [place] home who has non-small cell lung cancer and is on chemo and radiation,  hypertension,  nicotine abuse, alcohol abuse, prostate cancer who presents for worsening left-sided weakness. He was last hospitalized from 06/30/2020 through 07/15/2020 with an acute right-sided CVA with left-sided weakness and transferred to SNF for rehab. The head in the ED showed no acute infarcts  Subjective: His left sided weakness is the same as yesterday. No hemoptysis.    Assessment & Plan:   Principal Problem:   Acute ischemic stroke, multifocal  -Worsening weakness of his left arm and leg-there appears to be a left facial droop on exam but when asked to smile, his smile is equal -6/11 > MRI reveals patchy acute ischemic infarcts in the right basal ganglia, right parietal white matter, right frontal and occipital lobes and probable mild associated petechial hemorrhage in the right occipital region.  He has chronic infarcts in the right basal ganglia, left frontal and some scattered remote bilateral cerebral infarcts.  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 - MRA head again reveals severe distal right M1 stenosis -MRA neck does not show any significant stenosis -He was initially placed on aspirin and Plavix when he was last here with the CVA but due to hemoptysis, Plavix was held and he was continued on aspirin 325 mg.  On 5/2 due to hematuria, aspirin was reduced to 81 mg daily.  He is currently having no hemoptysis or hematuria -Also noted on TEE on 5/2 was a small mass on the aortic valve (cannot rule out vegetation) and the right cusp appeared to be perforated with mild to moderate AI -2 D ECHO> There is a large density on the AV that is  likely eccentric calcific  deegeneration of the aortic valve but cannot rule out mass. In the setting  of CVA, recommend TEE to better define the aortic valve. 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    Time spent in minutes: 35 DVT prophylaxis: enoxaparin (LOVENOX) injection 40 mg Start: 08/20/20 2045  Code Status: DO NOT RESUSCITATE Family Communication:  Level of Care: Level of care: Telemetry Medical Disposition Plan:  Status is: Inpatient  Remains inpatient appropriate because:Inpatient level of care appropriate due to severity of illness  Dispo: The patient is from: SNF              Anticipated d/c is to: SNF              Patient currently is not medically stable  to d/c.   Difficult to place patient No      Consultants:  Neurology Procedures:  None Antimicrobials:  Anti-infectives (From admission, onward)    None        Objective: Vitals:   08/22/20 1700 08/22/20 2007 08/22/20 2330 08/23/20 0429  BP: 131/73 113/66 102/60 122/70  Pulse: 100 92 100 83  Resp: 18 20 18 20   Temp: 97.9 F (36.6 C) 98.3 F (36.8 C) 98.6 F (37 C) 97.7 F (36.5 C)  TempSrc: Oral Oral Oral Oral  SpO2: 95% 96% 94% 100%  Weight:      Height:        Intake/Output  Summary (Last 24 hours) at 08/23/2020 0758 Last data filed at 08/22/2020 1832 Gross per 24 hour  Intake 240 ml  Output 875 ml  Net -635 ml    Filed Weights   08/20/20 1859  Weight: 64.5 kg    Examination: General exam: Appears comfortable  HEENT: PERRLA, oral mucosa moist, no sclera icterus or thrush Respiratory system: Clear to auscultation. Respiratory effort normal. Cardiovascular system: S1 & S2 heard, regular rate and rhythm Gastrointestinal system: Abdomen soft, non-tender, nondistended. Normal bowel sounds   Central nervous system: Alert and oriented. LEft arm 0/5 and flaccid, left leg 3-4/5- left facial droop Extremities: No cyanosis, clubbing or edema Skin: No rashes or ulcers Psychiatry:  Mood & affect appropriate.      Data Reviewed: I have personally reviewed following labs and imaging studies  CBC: Recent Labs  Lab 08/17/20 0735 08/20/20 1901 08/20/20 2016 08/22/20 0843  WBC 4.2 6.6  --  5.3  NEUTROABS 3.0 5.4  --   --   HGB 11.4* 11.5* 13.9 11.5*  HCT 37.0* 36.8* 41.0 36.3*  MCV 93.2 92.7  --  93.1  PLT 302 316  --  656    Basic Metabolic Panel: Recent Labs  Lab 08/17/20 0735 08/20/20 1901 08/20/20 2016 08/21/20 0141 08/22/20 0843 08/23/20 0337  NA 142 138 140  --  137 138  K 4.0 3.8 3.6  --  3.6 4.1  CL 106 101 103  --  104 105  CO2 24 25  --   --  26 28  GLUCOSE 109* 118* 109*  --  108* 119*  BUN 13 9 11   --  13 13  CREATININE 0.60* 0.51* 0.40*  --  0.45* 0.57*  CALCIUM 9.5 9.1  --   --  9.0 8.8*  MG  --   --   --  1.9  --  1.9  PHOS  --   --   --  3.0  --   --     GFR: Estimated Creatinine Clearance: 77.3 mL/min (A) (by C-G formula based on SCr of 0.57 mg/dL (L)). Liver Function Tests: Recent Labs  Lab 08/17/20 0735 08/20/20 1901 08/22/20 0843 08/23/20 0337  AST 13* 18 16 14*  ALT 12 15 14 12   ALKPHOS 106 93 99 86  BILITOT 0.4 0.8 0.7 0.4  PROT 6.8 6.5 6.0* 5.7*  ALBUMIN 3.1* 3.3* 3.0* 2.8*    No results for input(s):  LIPASE, AMYLASE in the last 168 hours. No results for input(s): AMMONIA in the last 168 hours. Coagulation Profile: Recent Labs  Lab 08/20/20 1901  INR 1.0    Cardiac Enzymes: No results for input(s): CKTOTAL, CKMB, CKMBINDEX, TROPONINI in the last 168 hours. BNP (last 3 results) No results for input(s): PROBNP in the last 8760 hours. HbA1C: No results for input(s): HGBA1C in  the last 72 hours. CBG: Recent Labs  Lab 08/20/20 1902  GLUCAP 106*    Lipid Profile: Recent Labs    08/21/20 0141  CHOL 141  HDL 27*  LDLCALC 96  TRIG 88  CHOLHDL 5.2    Thyroid Function Tests: No results for input(s): TSH, T4TOTAL, FREET4, T3FREE, THYROIDAB in the last 72 hours. Anemia Panel: No results for input(s): VITAMINB12, FOLATE, FERRITIN, TIBC, IRON, RETICCTPCT in the last 72 hours. Urine analysis:    Component Value Date/Time   COLORURINE RED (A) 07/13/2020 1901   APPEARANCEUR TURBID (A) 07/13/2020 1901   LABSPEC  07/13/2020 1901    TEST NOT REPORTED DUE TO COLOR INTERFERENCE OF URINE PIGMENT   PHURINE  07/13/2020 1901    TEST NOT REPORTED DUE TO COLOR INTERFERENCE OF URINE PIGMENT   GLUCOSEU (A) 07/13/2020 1901    TEST NOT REPORTED DUE TO COLOR INTERFERENCE OF URINE PIGMENT   HGBUR (A) 07/13/2020 1901    TEST NOT REPORTED DUE TO COLOR INTERFERENCE OF URINE PIGMENT   BILIRUBINUR (A) 07/13/2020 1901    TEST NOT REPORTED DUE TO COLOR INTERFERENCE OF URINE PIGMENT   KETONESUR (A) 07/13/2020 1901    TEST NOT REPORTED DUE TO COLOR INTERFERENCE OF URINE PIGMENT   PROTEINUR (A) 07/13/2020 1901    TEST NOT REPORTED DUE TO COLOR INTERFERENCE OF URINE PIGMENT   UROBILINOGEN 2.0 (H) 12/13/2013 0738   NITRITE (A) 07/13/2020 1901    TEST NOT REPORTED DUE TO COLOR INTERFERENCE OF URINE PIGMENT   LEUKOCYTESUR (A) 07/13/2020 1901    TEST NOT REPORTED DUE TO COLOR INTERFERENCE OF URINE PIGMENT   Sepsis Labs: @LABRCNTIP (procalcitonin:4,lacticidven:4) ) Recent Results (from the past 240  hour(s))  SARS CORONAVIRUS 2 (TAT 6-24 HRS) Nasopharyngeal Nasopharyngeal Swab     Status: None   Collection Time: 08/20/20  9:36 PM   Specimen: Nasopharyngeal Swab  Result Value Ref Range Status   SARS Coronavirus 2 NEGATIVE NEGATIVE Final    Comment: (NOTE) SARS-CoV-2 target nucleic acids are NOT DETECTED.  The SARS-CoV-2 RNA is generally detectable in upper and lower respiratory specimens during the acute phase of infection. Negative results do not preclude SARS-CoV-2 infection, do not rule out co-infections with other pathogens, and should not be used as the sole basis for treatment or other patient management decisions. Negative results must be combined with clinical observations, patient history, and epidemiological information. The expected result is Negative.  Fact Sheet for Patients: SugarRoll.be  Fact Sheet for Healthcare Providers: https://www.woods-mathews.com/  This test is not yet approved or cleared by the [place] FDA and  has been authorized for detection and/or diagnosis of SARS-CoV-2 by FDA under an Emergency Use Authorization (EUA). This EUA will remain  in effect (meaning this test can be used) for the duration of the COVID-19 declaration under Se ction 564(b)(1) of the Act, 21 U.S.C. section 360bbb-3(b)(1), unless the authorization is terminated or revoked sooner.  Performed at [facility], [place] [address], [place], [place] [address]          Radiology Studies: ECHOCARDIOGRAM COMPLETE  Result Date: 08/22/2020    ECHOCARDIOGRAM REPORT   Patient Name:   MARCIA LEPERA Date of Exam: 08/22/2020 Medical Rec #:  [id number]      Height:       71.0 in Accession #:    [id number]     Weight:       142.1 lb Date of Birth:  October 26, 1950      BSA:  1.823 m Patient Age:    89 years       BP:           111/68 mmHg Patient Gender: M              HR:           96 bpm. Exam Location:  Inpatient Procedure: 2D Echo,  Cardiac Doppler and Color Doppler Indications:    Stroke                 MV disorder  History:        Patient has prior history of Echocardiogram examinations, most                 recent 07/12/2020. Risk Factors:Hypertension and Current Smoker.                 Prostate cancer. ETOH abuse.  Sonographer:    Clayton Lefort RDCS (AE) Referring Phys: [place]  1. Left ventricular ejection fraction, by estimation, is 60 to 65%. The left ventricle has normal function. The left ventricle has no regional wall motion abnormalities. Left ventricular diastolic parameters are consistent with Grade I diastolic dysfunction (impaired relaxation).  2. Right ventricular systolic function is mildly reduced. The right ventricular size is normal. Tricuspid regurgitation signal is inadequate for assessing PA pressure.  3. There is a large density on the mitral valve annulus that is most consistent with severe mitral annular calcification. . The mitral valve is degenerative. No evidence of mitral valve regurgitation. No evidence of mitral stenosis. Severe mitral annular calcification.  4. There is a large density on the AV that is likely eccentric calcific deegeneration of the aortic valve but cannot rule out mass. In the setting of CVA, recommend TEE to better define the aortic valve. . The aortic valve is calcified. There is severe calcifcation of the aortic valve. There is severe thickening of the aortic valve. Aortic valve regurgitation is moderate. No aortic stenosis is present. Aortic regurgitation PHT measures 359 msec. Aortic valve area, by VTI measures 3.77 cm. Aortic valve  mean gradient measures 5.0 mmHg. Aortic valve Vmax measures 1.49 m/s.  5. Aortic dilatation noted. There is mild dilatation of the aortic root, measuring 41 mm.  6. The inferior vena cava is normal in size with greater than 50% respiratory variability, suggesting right atrial pressure of 3 mmHg. FINDINGS  Left Ventricle: Left ventricular  ejection fraction, by estimation, is 60 to 65%. The left ventricle has normal function. The left ventricle has no regional wall motion abnormalities. The left ventricular internal cavity size was normal in size. There is  no left ventricular hypertrophy. Left ventricular diastolic parameters are consistent with Grade I diastolic dysfunction (impaired relaxation). Normal left ventricular filling pressure. Right Ventricle: The right ventricular size is normal. No increase in right ventricular wall thickness. Right ventricular systolic function is mildly reduced. Tricuspid regurgitation signal is inadequate for assessing PA pressure. Left Atrium: Left atrial size was normal in size. Right Atrium: Right atrial size was normal in size. Pericardium: There is no evidence of pericardial effusion. Mitral Valve: There is a large density on the mitral valve annulus that is most consistent with severe mitral annular calcification. The mitral valve is degenerative in appearance. There is moderate thickening of the anterior mitral valve leaflet(s). There is moderate calcification of the anterior mitral valve leaflet(s). Severe mitral annular calcification. No evidence of mitral valve regurgitation. No evidence of mitral valve stenosis. Tricuspid Valve:  The tricuspid valve is normal in structure. Tricuspid valve regurgitation is trivial. No evidence of tricuspid stenosis. Aortic Valve: There is a large density on the AV that is likely eccentric calcific deegeneration of the aortic valve but cannot rule out mass. In the setting of CVA, recommend TEE to better define the aortic valve. The aortic valve is calcified. There is  severe calcifcation of the aortic valve. There is severe thickening of the aortic valve. Aortic valve regurgitation is moderate. Aortic regurgitation PHT measures 359 msec. No aortic stenosis is present. Aortic valve mean gradient measures 5.0 mmHg. Aortic valve peak gradient measures 8.9 mmHg. Aortic valve  area, by VTI measures 3.77 cm. Pulmonic Valve: The pulmonic valve was normal in structure. Pulmonic valve regurgitation is not visualized. No evidence of pulmonic stenosis. Aorta: Aortic dilatation noted. There is mild dilatation of the aortic root, measuring 41 mm. Venous: The inferior vena cava is normal in size with greater than 50% respiratory variability, suggesting right atrial pressure of 3 mmHg. IAS/Shunts: No atrial level shunt detected by color flow Doppler.  LEFT VENTRICLE PLAX 2D LVIDd:         4.40 cm  Diastology LVIDs:         3.10 cm  LV e' medial:    6.42 cm/s LV PW:         1.00 cm  LV E/e' medial:  10.2 LV IVS:        1.10 cm  LV e' lateral:   10.10 cm/s LVOT diam:     2.30 cm  LV E/e' lateral: 6.5 LV SV:         91 LV SV Index:   50 LVOT Area:     4.15 cm  RIGHT VENTRICLE             IVC RV Basal diam:  2.50 cm     IVC diam: 1.80 cm RV S prime:     12.00 cm/s TAPSE (M-mode): 1.6 cm LEFT ATRIUM             Index       RIGHT ATRIUM           Index LA diam:        3.10 cm 1.70 cm/m  RA Area:     13.40 cm LA Vol (A2C):   28.6 ml 15.68 ml/m RA Volume:   31.20 ml  17.11 ml/m LA Vol (A4C):   26.9 ml 14.75 ml/m LA Biplane Vol: 30.5 ml 16.73 ml/m  AORTIC VALVE AV Area (Vmax):    3.43 cm AV Area (Vmean):   3.20 cm AV Area (VTI):     3.77 cm AV Vmax:           149.00 cm/s AV Vmean:          111.000 cm/s AV VTI:            0.240 m AV Peak Grad:      8.9 mmHg AV Mean Grad:      5.0 mmHg LVOT Vmax:         123.00 cm/s LVOT Vmean:        85.400 cm/s LVOT VTI:          0.218 m LVOT/AV VTI ratio: 0.91 AI PHT:            359 msec  AORTA Ao Root diam: 4.10 cm MITRAL VALVE MV Area (PHT): 4.29 cm     SHUNTS MV Decel Time: 177 msec  Systemic VTI:  0.22 m MV E velocity: 65.60 cm/s   Systemic Diam: 2.30 cm MV A velocity: 108.00 cm/s MV E/A ratio:  0.61 Fransico Him MD Electronically signed by Fransico Him MD Signature Date/Time: 08/22/2020/3:25:05 PM    Final       Scheduled Meds:  ALPRAZolam  0.5 mg  Oral TID   aspirin  162 mg Oral Daily   atorvastatin  40 mg Oral Daily   enoxaparin (LOVENOX) injection  40 mg Subcutaneous Q24H   feeding supplement  237 mL Oral BID BM   folic acid  1 mg Oral Daily   multivitamin with minerals  1 tablet Oral Daily   nystatin  5 mL Mouth/Throat QID   thiamine  100 mg Oral Daily   Or   thiamine  100 mg Intravenous Daily   Continuous Infusions:   LOS: 3 days      Debbe Odea, MD [facility] Pager: [email or web address] 08/23/2020, 7:58 AM

## 2020-08-23 NOTE — Progress Notes (Signed)
    [facility] has been requested to perform a transesophageal echocardiogram on [name] for stroke and abnormal echo.  After careful review of history and examination, the risks and benefits of transesophageal echocardiogram have been explained including risks of esophageal damage, perforation (1:10,000 risk), bleeding, pharyngeal hematoma as well as other potential complications associated with conscious sedation including aspiration, arrhythmia, respiratory failure and death. Alternatives to treatment were discussed, questions were answered. Patient is willing to proceed.  TEE - Dr. Radford Pax 08/23/20 @ 1400. NPO after midnight. Meds with sips.   Leanor Kail, PA-C 08/23/2020 9:13 AM

## 2020-08-23 NOTE — Progress Notes (Addendum)
Telemetry called to report pt having 4 beats run of VT. Pt assessed to be sleeping comfortably in bed with call light within reach. MD on call [place] notified and new orders received. Delia Heady RN

## 2020-08-23 NOTE — Care Management Important Message (Signed)
Important Message  Patient Details  Name: Cody Douglas MRN: [id number] Date of Birth: 1950-11-11   Medicare Important Message Given:  Yes     Cody Douglas 08/23/2020, 3:43 PM

## 2020-08-23 NOTE — Progress Notes (Signed)
Pt refusing cpap for the night. RT will continue to monitor as needed. 

## 2020-08-23 NOTE — TOC Progression Note (Addendum)
Transition of Care [facility]) - Progression Note    Patient Details  Name: MARTI ACEBO MRN: [id number] Date of Birth: 1951/02/18  Transition of Care [facility]) CM/SW [place], [place] Phone Number: 08/23/2020, 3:33 PM  Clinical Narrative:   CSW following for discharge to SNF. CSW reached out to [place] for insurance authorization, and authorization is still pending at this time. CSW to follow.  UPDATE 4:06 PM: Everlene Balls has requested peer to peer for SNF authorization. CSW sent information to MD. CSW to follow.    Expected Discharge Plan: [place] Barriers to Discharge: Continued Medical Work up, Ship broker  Expected Discharge Plan and Services Expected Discharge Plan: [place] Choice: [place] arrangements for the past 2 months: [place]                                       Social Determinants of Health (SDOH) Interventions    Readmission Risk Interventions No flowsheet data found.

## 2020-08-24 ENCOUNTER — Inpatient Hospital Stay (HOSPITAL_COMMUNITY): Payer: Medicare Other

## 2020-08-24 ENCOUNTER — Encounter (HOSPITAL_COMMUNITY)
Admission: EM | Disposition: A | Discharge status: Hospice | Payer: Self-pay | Source: Skilled Nursing Facility | Attending: Internal Medicine

## 2020-08-24 ENCOUNTER — Inpatient Hospital Stay (HOSPITAL_COMMUNITY): Payer: Medicare Other | Admitting: Certified Registered Nurse Anesthetist

## 2020-08-24 ENCOUNTER — Ambulatory Visit
Admission: RE | Admit: 2020-08-24 | Discharge: 2020-08-24 | Disposition: A | Discharge status: Home / Self Care | Payer: Medicare Other | Source: Ambulatory Visit | Attending: Radiation Oncology | Admitting: Radiation Oncology

## 2020-08-24 DIAGNOSIS — I77810 Thoracic aortic ectasia: Secondary | ICD-10-CM | POA: Diagnosis not present

## 2020-08-24 DIAGNOSIS — I340 Nonrheumatic mitral (valve) insufficiency: Secondary | ICD-10-CM

## 2020-08-24 DIAGNOSIS — I639 Cerebral infarction, unspecified: Secondary | ICD-10-CM | POA: Diagnosis not present

## 2020-08-24 DIAGNOSIS — I358 Other nonrheumatic aortic valve disorders: Secondary | ICD-10-CM | POA: Diagnosis not present

## 2020-08-24 DIAGNOSIS — C3411 Malignant neoplasm of upper lobe, right bronchus or lung: Secondary | ICD-10-CM | POA: Diagnosis not present

## 2020-08-24 DIAGNOSIS — I10 Essential (primary) hypertension: Secondary | ICD-10-CM | POA: Diagnosis not present

## 2020-08-24 HISTORY — PX: BUBBLE STUDY: SHX6837

## 2020-08-24 HISTORY — PX: TEE WITHOUT CARDIOVERSION: SHX5443

## 2020-08-24 SURGERY — ECHOCARDIOGRAM, TRANSESOPHAGEAL
Anesthesia: Monitor Anesthesia Care

## 2020-08-24 MED ORDER — HEPARIN (PORCINE) 25000 UT/250ML-% IV SOLN
1450.0000 [IU]/h | INTRAVENOUS | Status: DC
  Administered 2020-08-24: 900 [IU]/h via INTRAVENOUS
  Administered 2020-08-25: 1300 [IU]/h via INTRAVENOUS
  Administered 2020-08-26: 1350 [IU]/h via INTRAVENOUS
  Administered 2020-08-27: 1450 [IU]/h via INTRAVENOUS
  Administered 2020-08-27: 1350 [IU]/h via INTRAVENOUS
  Filled 2020-08-24 (×5): qty 250

## 2020-08-24 MED ORDER — PROPOFOL 10 MG/ML IV BOLUS
INTRAVENOUS | Status: DC | PRN
  Administered 2020-08-24: 30 mg via INTRAVENOUS
  Administered 2020-08-24: 25 mg via INTRAVENOUS

## 2020-08-24 MED ORDER — LIDOCAINE HCL (PF) 2 % IJ SOLN
INTRAMUSCULAR | Status: DC | PRN
  Administered 2020-08-24: 60 mg via INTRADERMAL

## 2020-08-24 MED ORDER — PROPOFOL 500 MG/50ML IV EMUL
INTRAVENOUS | Status: DC | PRN
  Administered 2020-08-24: 100 ug/kg/min via INTRAVENOUS

## 2020-08-24 NOTE — Consult Note (Addendum)
Cardiology Consultation:   Patient ID: Cody Douglas MRN: [id number]; DOB: 05-21-1950  Admit date: 08/20/2020 Date of Consult: 08/24/2020  PCP:  Tamsen Roers, MD   [facility] Providers Cardiologist:  New to Dr. Johney Frame   Patient Profile:   Cody Douglas is a 70 y.o. male with a hx of hypertension, stroke, prostate cancer, recent diagnosis of lung cancer  (on chemoradiation) ongoing tobacco abuse and alcohol abuse who is being seen 08/24/2020 for the evaluation of aortic mass at the request of Dr. Wynelle Cleveland.  History of Present Illness:   Cody Douglas was admitted 4/20-5/5 with acute right-sided CVA with left-sided weakness.  During this admission he was also diagnosed with non-cell lung cancer and started on chemoradiation following discharge.  He was only discharged on aspirin due to hemoptysis.  Plavix was discontinued.  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  Patient presented from [place] home on June 10 with worsening left-sided weakness and facial droops. MRI revealed patchy acute ischemic infarcts in the right basal ganglia, right parietal white matter, right frontal and occipital lobes and probable mild associated petechial hemorrhage in the right occipital region.  Transthoracic echocardiogram 6/12 showed LV function of 60 to 65%, large density on the AV that is likely eccentric calcific  dee-generation of the aortic valve but cannot rule out mass. In the setting  of CVA, recommend TEE to better define the aortic valve.    TEE 08/24/20 IMPRESSIONS    1. Left ventricular ejection fraction, by estimation, is 60 to 65%. The  left ventricle has normal  function.   2. Right ventricular systolic function is normal. The right ventricular  size is normal.   3. No left atrial/left atrial appendage thrombus was detected.   4. The mitral valve is grossly normal. Mild to moderate mitral valve  regurgitation.   5. 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 This mass  appears to be more mobile than on his last echo on Jul 12, 2020. . The  aortic valve is abnormal. Aortic valve regurgitation is moderate to  severe.   Past Medical History:  Diagnosis Date   Anxiety    Dyspnea    with exertion - has inhaler   Frequent headaches    GERD (gastroesophageal reflux disease)    Hypertension    Prostate cancer ([place])    Stroke [facility])    Patient reports having a stroke on 4/13    Past Surgical History:  Procedure Laterality Date   BRONCHIAL BRUSHINGS  07/09/2020   Procedure: BRONCHIAL BRUSHINGS;  Surgeon: Garner Nash, DO;  Location: [place] ENDOSCOPY;  Service: Pulmonary;;   BRONCHIAL WASHINGS  07/09/2020   Procedure: BRONCHIAL WASHINGS;  Surgeon: Garner Nash, DO;  Location: [place] ENDOSCOPY;  Service: Pulmonary;;   CRYOTHERAPY  07/09/2020   Procedure: CRYOTHERAPY;  Surgeon: Garner Nash, DO;  Location: [place] ENDOSCOPY;  Service: Pulmonary;;   EYE SURGERY     HEMOSTASIS CONTROL  07/09/2020   Procedure: HEMOSTASIS CONTROL;  Surgeon: Garner Nash, DO;  Location: [place] ENDOSCOPY;  Service: Pulmonary;;   PROSTATE BIOPSY     Prostate surgery 8-9 yrs ago   TEE WITHOUT CARDIOVERSION N/A 07/12/2020   Procedure: TRANSESOPHAGEAL ECHOCARDIOGRAM (  TEE);  Surgeon: Lelon Perla, MD;  Location: [facility];  Service: Cardiovascular;  Laterality: N/A;   VIDEO BRONCHOSCOPY WITH ENDOBRONCHIAL ULTRASOUND Bilateral 07/09/2020   Procedure: VIDEO BRONCHOSCOPY WITH ENDOBRONCHIAL ULTRASOUND;  Surgeon: Garner Nash, DO;  Location: [place];  Service: Pulmonary;  Laterality: Bilateral;     Inpatient Medications: Scheduled Meds:  ALPRAZolam  0.5  mg Oral TID   atorvastatin  40 mg Oral Daily   feeding supplement  237 mL Oral BID BM   folic acid  1 mg Oral Daily   metoprolol succinate  25 mg Oral Daily   multivitamin with minerals  1 tablet Oral Daily   nystatin  5 mL Mouth/Throat QID   polyethylene glycol  17 g Oral Daily   thiamine  100 mg Oral Daily   Or   thiamine  100 mg Intravenous Daily   Continuous Infusions:  sodium chloride 20 mL/hr at 08/24/20 1138   heparin     PRN Meds: acetaminophen **OR** acetaminophen (TYLENOL) oral liquid 160 mg/5 mL **OR** acetaminophen, bisacodyl, bisacodyl, oxyCODONE-acetaminophen  Allergies:   No Known Allergies  Social History:   Social History   Socioeconomic History   Marital status: Widowed    Spouse name: Not on file   Number of children: Not on file   Years of education: Not on file   Highest education level: Not on file  Occupational History   Not on file  Tobacco Use   Smoking status: Every Day    Packs/day: 2.50    Years: 40.00    Pack years: 100.00    Types: Cigarettes   Smokeless tobacco: Never  Vaping Use   Vaping Use: Never used  Substance and Sexual Activity   Alcohol use: Yes    Alcohol/week: 1.0 standard drink    Types: 1 Cans of beer per week    Comment: couple of days a week   Drug use: No   Sexual activity: Not Currently  Other Topics Concern   Not on file  Social History Narrative   Not on file   Social Determinants of Health   Financial Resource Strain: Not on file  Food Insecurity: Not on file  Transportation Needs: Not on file  Physical Activity: Not on file  Stress: Not on file  Social Connections: Not on file  Intimate Partner Violence: Not on file    Family History:   Family History  Problem Relation Age of Onset   Cancer - Lung Father    Heart disease Brother    Diabetes type II Neg Hx    Stroke Neg Hx      ROS:  Please see the history of present illness.  All other ROS reviewed and negative.     Physical Exam/Data:    Vitals:   08/24/20 1215 08/24/20 1230 08/24/20 1322 08/24/20 1542  BP: 102/62 (!) 115/58  134/73  Pulse: 86 79  86  Resp: (!) 26 (!) _0 Temp:    98.3 F (36.8 C)  TempSrc:    Oral  SpO2: 95% 97%  97%  Weight:      Height:        Intake/Output Summary (Last 24 hours) at 08/24/2020 1657 Last data filed at 08/24/2020 1322 Gross per 24 hour  Intake 234.13 ml  Output 650 ml  Net -415.87 ml   Last 3 Weights 08/20/2020 08/17/2020 08/03/2020  Weight (lbs) 142 lb 1.6 oz 142 lb 1.6 oz 141 lb  Weight (kg) 64.456 kg  64.456 kg 63.957 kg     Body mass index is 19.82 kg/m.  General: Thin frail ill-appearing elderly male in no acute distress HEENT: normal Lymph: no adenopathy Neck: no JVD Endocrine:  No thryomegaly Vascular: No carotid bruits; FA pulses 2+ bilaterally without bruits  Cardiac:  normal S1, S2; RRR; no murmur  Lungs:  clear to auscultation bilaterally, no wheezing, rhonchi or rales  Abd: soft, nontender, no hepatomegaly  Ext: no edema Musculoskeletal:  No deformities, BUE and BLE strength normal and equal Skin: warm and dry  Neuro:  CNs 2-12 intact, no focal abnormalities noted Psych:  Normal affect   EKG:  The EKG was personally reviewed and demonstrates:  SR, HR 83, RBBB Telemetry:  Telemetry was personally reviewed and demonstrates:  SR, 4 beats of NSVT  Relevant CV Studies:  TTE 08/23/1998 1. Left ventricular ejection fraction, by estimation, is 60 to 65%. The  left ventricle has normal function. The left ventricle has no regional  wall motion abnormalities. Left ventricular diastolic parameters are  consistent with Grade I diastolic  dysfunction (impaired relaxation).   2. Right ventricular systolic function is mildly reduced. The right  ventricular size is normal. Tricuspid regurgitation signal is inadequate  for assessing PA pressure.   3. There is a large density on the mitral valve annulus that is most  consistent with severe mitral annular  calcification. . The mitral valve is  degenerative. No evidence of mitral valve regurgitation. No evidence of  mitral stenosis. Severe mitral  annular calcification.   4. There is a large density on the AV that is likely eccentric calcific  deegeneration of the aortic valve but cannot rule out mass. In the setting  of CVA, recommend TEE to better define the aortic valve. . The aortic  valve is calcified. There is severe  calcifcation of the aortic valve. There is severe thickening of the aortic  valve. Aortic valve regurgitation is moderate. No aortic stenosis is  present. Aortic regurgitation PHT measures 359 msec. Aortic valve area, by  VTI measures 3.77 cm. Aortic valve   mean gradient measures 5.0 mmHg. Aortic valve Vmax measures 1.49 m/s.   5. Aortic dilatation noted. There is mild dilatation of the aortic root,  measuring 41 mm.   6. The inferior vena cava is normal in size with greater than 50%  respiratory variability, suggesting right atrial pressure of 3 mmHg.   Laboratory Data:  High Sensitivity Troponin:  No results for input(s): TROPONINIHS in the last 720 hours.   Chemistry Recent Labs  Lab 08/20/20 1901 08/20/20 2016 08/22/20 0843 08/23/20 0337  NA 138 140 137 138  K 3.8 3.6 3.6 4.1  CL 101 103 104 105  CO2 25  --  26 28  GLUCOSE 118* 109* 108* 119*  BUN _0 CREATININE 0.51* 0.40* 0.45* 0.57*  CALCIUM 9.1  --  9.0 8.8*  GFRNONAA >60  --  >60 >60  ANIONGAP 12  --  7 5    Recent Labs  Lab 08/20/20 1901 08/22/20 0843 08/23/20 0337  PROT 6.5 6.0* 5.7*  ALBUMIN 3.3* 3.0* 2.8*  AST 18 16 14*  ALT _1 ALKPHOS 93 99 86  BILITOT 0.8 0.7 0.4   Hematology Recent Labs  Lab 08/20/20 1901 08/20/20 2016 08/22/20 0843  WBC 6.6  --  5.3  RBC 3.97*  --  3.90*  HGB 11.5* 13.9 11.5*  HCT 36.8* 41.0 36.3*  MCV 92.7  --  93.1  MCH 29.0  --  29.5  MCHC 31.3  --  31.7  RDW 14.1  --  14.4  PLT 316  --  318   BNPNo results for input(s): BNP,  PROBNP in the last 168 hours.  DDimer No results for input(s): DDIMER in the last 168 hours.   Radiology/Studies:  MR ANGIO HEAD WO CONTRAST  Result Date: 08/21/2020 CLINICAL DATA:  Initial evaluation for acute left-sided weakness, stroke. EXAM: MRI HEAD WITHOUT AND WITH CONTRAST MRA HEAD WITHOUT CONTRAST MRA NECK WITHOUT AND WITH CONTRAST TECHNIQUE: Multiplanar, multi-echo pulse sequences of the brain and surrounding structures were acquired without intravenous contrast. Angiographic images of the Circle of Willis were acquired using MRA technique without intravenous contrast. Angiographic images of the neck were acquired using MRA technique without and with intravenous contrast. Carotid stenosis measurements (when applicable) are obtained utilizing NASCET criteria, using the distal internal carotid diameter as the denominator. CONTRAST:  6.6m GADAVIST GADOBUTROL 1 MMOL/ML IV SOLN COMPARISON:  Prior CT from 08/20/2020 and MRI from 06/30/2020. FINDINGS: MRI HEAD FINDINGS Brain: Diffuse prominence of the CSF containing spaces compatible with generalized cerebral atrophy. Encephalomalacia and gliosis involving the cortical and subcortical aspect of the anterior left frontal lobe consistent with a chronic ischemic infarct, stable. There has been interval evolution of previously identified infarct at the right basal ganglia, now largely chronic in appearance. Associated susceptibility artifact now seen at this location, consistent with petechial hemorrhage. Intrinsic T1 hyperintensity likely reflects associated mineralization. Few scattered remote bilateral cerebellar infarcts noted as well. Patchy foci of restricted diffusion are seen involving the posterior right basal ganglia, immediately adjacent to the area prior infarction (series 5, image 79). Mild extension into the adjacent right external capsule. Additional patchy involvement of the right periatrial white matter (series 5, image 75) as well as the  right occipital cortex (series 5, image 74). No significant mass effect. Scattered foci of susceptibility artifact at the right temporal occipital region is new from previous, and could reflect associated petechial hemorrhage (series 18, image 27). Subtle patchy cortical involvement of the right frontal operculum noted as well (a series 5, image 77). No other evidence for acute or subacute ischemia. Gray-white matter differentiation otherwise maintained. No other acute intracranial hemorrhage. 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 No mass lesion, midline shift or mass effect. No hydrocephalus or extra-axial fluid collection. Pituitary gland suprasellar region normal. Midline structures intact. No abnormal enhancement. Pituitary gland and suprasellar region are normal. Midline structures intact and normal. Vascular: Major intracranial vascular flow voids are maintained. Skull and upper cervical spine: Craniocervical junction within normal limits. Bone marrow signal intensity normal. No focal marrow replacing lesion. No scalp soft tissue abnormality. Sinuses/Orbits: Patient status post bilateral ocular lens replacement. Scattered mucosal thickening noted within the ethmoidal air cells. Paranasal sinuses are otherwise clear. No significant mastoid effusion. Inner ear structures grossly normal. Other: None. MRA HEAD FINDINGS ANTERIOR CIRCULATION: Visualized distal cervical segments of the internal carotid arteries are patent with antegrade flow. Petrous, cavernous, and supraclinoid segments patent without stenosis or other abnormality. A1 segments widely patent. Normal anterior communicating artery complex. Anterior cerebral arteries patent to their distal aspects without stenosis. Left M1 segment widely patent, with short-segment fenestration proximally. Normal  left MCA bifurcation. Distal left MCA branches well perfused. Right M1 segment patent proximally. 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 Right MCA branches demonstrate decreased perfusion but remain patent distally. Diffuse small vessel atheromatous irregularity seen about the MCA branches bilaterally. POSTERIOR CIRCULATION: Both V4 segments patent to the vertebrobasilar junction without stenosis. Left vertebral artery slightly dominant. Both PICA origins patent and normal. Basilar patent to its distal aspect without stenosis. Superior cerebellar arteries patent bilaterally. Left PCA supplied via the basilar. Fetal type origin of the right PCA. Both PCAs well perfused to their distal aspects without stenosis. No intracranial aneurysm. MRA NECK FINDINGS AORTIC ARCH: Examination technically limited by timing of the contrast bolus. Partially visualized aortic arch grossly within normal limits for caliber with normal 3 vessel morphology. No hemodynamically significant stenosis seen about the origin of the great vessels. RIGHT CAROTID SYSTEM: Visualized right CCA patent from its origin to the bifurcation without stenosis. Mild atheromatous irregularity about the right carotid bulb/proximal right ICA with associated estimated stenosis of up to 30-40% stenosis by NASCET criteria. Right ICA patent distally without stenosis, evidence for dissection or occlusion. LEFT CAROTID SYSTEM: Left CCA patent from its origin to the bifurcation without stenosis. Mild atheromatous irregularity about the left carotid bulb/proximal left ICA without hemodynamically significant stenosis. Left ICA patent distally without stenosis, evidence for dissection or occlusion. VERTEBRAL ARTERIES: Both vertebral arteries arise from the subclavian arteries. Left vertebral artery dominant. Origins of the vertebral arteries not well evaluated on this technically  limited exam. Visualized portions of the vertebral arteries patent without stenosis, evidence for dissection or occlusion. IMPRESSION: MRI HEAD: 1. Patchy small volume acute ischemic infarcts involving the right basal ganglia, right periatrial white matter, and overlying right frontal and occipital cortices as above. Probable mild associated petechial hemorrhage at the right occipital region. No frank hemorrhagic transformation or mass effect. 2. Interval evolution of previously identified right basal ganglia infarct, now largely chronic in appearance. 3. Additional chronic left frontal infarct, with a few additional scattered remote bilateral cerebellar infarcts. 4. 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 MRA HEAD: 1. 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 2. No other proximal high-grade or correctable stenosis about the major intracranial arterial circulation. 3. Diffuse small vessel atheromatous irregularity. MRA NECK: 1. Mild atheromatous irregularity about the carotid bifurcations/proximal ICAs bilaterally with estimated stenosis of up to 30-40% on the right. No significant stenosis about the left carotid artery system. 2. Widely patent vertebral arteries within the neck. Left vertebral artery dominant. Electronically Signed   By: Jeannine Boga M.D.   On: 08/21/2020 02:40   MR ANGIO NECK W WO CONTRAST  Result Date: 08/21/2020 CLINICAL DATA:  Initial evaluation for acute left-sided weakness, stroke. EXAM: MRI HEAD WITHOUT AND WITH CONTRAST MRA HEAD WITHOUT CONTRAST MRA NECK WITHOUT AND WITH CONTRAST TECHNIQUE: Multiplanar, multi-echo pulse sequences of the brain and surrounding structures were acquired without intravenous contrast. Angiographic images of the Circle of Willis were acquired using MRA technique without intravenous contrast. Angiographic images of the neck were acquired using  MRA technique without and with intravenous contrast. Carotid stenosis measurements (when applicable) are obtained utilizing NASCET criteria, using the distal internal carotid diameter as the denominator. CONTRAST:  6.89m GADAVIST GADOBUTROL 1 MMOL/ML IV SOLN COMPARISON:  Prior CT from 08/20/2020 and MRI from 06/30/2020. FINDINGS: MRI HEAD FINDINGS Brain: Diffuse prominence of the CSF containing spaces compatible with generalized cerebral atrophy. Encephalomalacia and gliosis involving the cortical and subcortical aspect of the anterior left frontal  lobe consistent with a chronic ischemic infarct, stable. There has been interval evolution of previously identified infarct at the right basal ganglia, now largely chronic in appearance. Associated susceptibility artifact now seen at this location, consistent with petechial hemorrhage. Intrinsic T1 hyperintensity likely reflects associated mineralization. Few scattered remote bilateral cerebellar infarcts noted as well. Patchy foci of restricted diffusion are seen involving the posterior right basal ganglia, immediately adjacent to the area prior infarction (series 5, image 79). Mild extension into the adjacent right external capsule. Additional patchy involvement of the right periatrial white matter (series 5, image 75) as well as the right occipital cortex (series 5, image 74). No significant mass effect. Scattered foci of susceptibility artifact at the right temporal occipital region is new from previous, and could reflect associated petechial hemorrhage (series 18, image 27). Subtle patchy cortical involvement of the right frontal operculum noted as well (a series 5, image 77). No other evidence for acute or subacute ischemia. Gray-white matter differentiation otherwise maintained. No other acute intracranial hemorrhage. 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 No mass lesion, midline shift or mass effect. No hydrocephalus or extra-axial fluid collection. Pituitary gland suprasellar region normal. Midline structures intact. No abnormal enhancement. Pituitary gland and suprasellar region are normal. Midline structures intact and normal. Vascular: Major intracranial vascular flow voids are maintained. Skull and upper cervical spine: Craniocervical junction within normal limits. Bone marrow signal intensity normal. No focal marrow replacing lesion. No scalp soft tissue abnormality. Sinuses/Orbits: Patient status post bilateral ocular lens replacement. Scattered mucosal thickening noted within the ethmoidal air cells. Paranasal sinuses are otherwise clear. No significant mastoid effusion. Inner ear structures grossly normal. Other: None. MRA HEAD FINDINGS ANTERIOR CIRCULATION: Visualized distal cervical segments of the internal carotid arteries are patent with antegrade flow. Petrous, cavernous, and supraclinoid segments patent without stenosis or other abnormality. A1 segments widely patent. Normal anterior communicating artery complex. Anterior cerebral arteries patent to their distal aspects without stenosis. Left M1 segment widely patent, with short-segment fenestration proximally. Normal left MCA bifurcation. Distal left MCA branches well perfused. Right M1 segment patent proximally. Severe stenosis involving the distal right M1 segment to the level of the bifurcation, stable from previous (series 1053, image 12). Right MCA branches demonstrate decreased perfusion but remain patent distally. Diffuse small vessel atheromatous irregularity seen about the MCA branches bilaterally. POSTERIOR CIRCULATION: Both V4 segments patent to the vertebrobasilar junction without stenosis. Left vertebral artery slightly dominant. Both PICA origins patent and normal. Basilar patent to its distal aspect without stenosis. Superior cerebellar  arteries patent bilaterally. Left PCA supplied via the basilar. Fetal type origin of the right PCA. Both PCAs well perfused to their distal aspects without stenosis. No intracranial aneurysm. MRA NECK FINDINGS AORTIC ARCH: Examination technically limited by timing of the contrast bolus. Partially visualized aortic arch grossly within normal limits for caliber with normal 3 vessel morphology. No hemodynamically significant stenosis seen about the origin of the great vessels. RIGHT CAROTID SYSTEM: Visualized right CCA patent from its origin to the bifurcation without stenosis. Mild atheromatous irregularity about the right carotid bulb/proximal right ICA with associated estimated stenosis of up to 30-40% stenosis by NASCET criteria. Right ICA patent distally without stenosis, evidence for dissection or occlusion. LEFT CAROTID SYSTEM: Left CCA patent from its origin to the bifurcation without stenosis. Mild atheromatous irregularity about the left carotid bulb/proximal left ICA without hemodynamically  significant stenosis. Left ICA patent distally without stenosis, evidence for dissection or occlusion. VERTEBRAL ARTERIES: Both vertebral arteries arise from the subclavian arteries. Left vertebral artery dominant. Origins of the vertebral arteries not well evaluated on this technically limited exam. Visualized portions of the vertebral arteries patent without stenosis, evidence for dissection or occlusion. IMPRESSION: MRI HEAD: 1. Patchy small volume acute ischemic infarcts involving the right basal ganglia, right periatrial white matter, and overlying right frontal and occipital cortices as above. Probable mild associated petechial hemorrhage at the right occipital region. No frank hemorrhagic transformation or mass effect. 2. Interval evolution of previously identified right basal ganglia infarct, now largely chronic in appearance. 3. Additional chronic left frontal infarct, with a few additional scattered remote  bilateral cerebellar infarcts. 4. 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 MRA HEAD: 1. 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 2. No other proximal high-grade or correctable stenosis about the major intracranial arterial circulation. 3. Diffuse small vessel atheromatous irregularity. MRA NECK: 1. Mild atheromatous irregularity about the carotid bifurcations/proximal ICAs bilaterally with estimated stenosis of up to 30-40% on the right. No significant stenosis about the left carotid artery system. 2. Widely patent vertebral arteries within the neck. Left vertebral artery dominant. Electronically Signed   By: Jeannine Boga M.D.   On: 08/21/2020 02:40   MR BRAIN W WO CONTRAST  Result Date: 08/21/2020 CLINICAL DATA:  Initial evaluation for acute left-sided weakness, stroke. EXAM: MRI HEAD WITHOUT AND WITH CONTRAST MRA HEAD WITHOUT CONTRAST MRA NECK WITHOUT AND WITH CONTRAST TECHNIQUE: Multiplanar, multi-echo pulse sequences of the brain and surrounding structures were acquired without intravenous contrast. Angiographic images of the Circle of Willis were acquired using MRA technique without intravenous contrast. Angiographic images of the neck were acquired using MRA technique without and with intravenous contrast. Carotid stenosis measurements (when applicable) are obtained utilizing NASCET criteria, using the distal internal carotid diameter as the denominator. CONTRAST:  6.69m GADAVIST GADOBUTROL 1 MMOL/ML IV SOLN COMPARISON:  Prior CT from 08/20/2020 and MRI from 06/30/2020. FINDINGS: MRI HEAD FINDINGS Brain: Diffuse prominence of the CSF containing spaces compatible with generalized cerebral atrophy. Encephalomalacia and gliosis involving the cortical and subcortical aspect of the anterior left frontal lobe consistent with a chronic ischemic infarct, stable. There has been interval evolution of  previously identified infarct at the right basal ganglia, now largely chronic in appearance. Associated susceptibility artifact now seen at this location, consistent with petechial hemorrhage. Intrinsic T1 hyperintensity likely reflects associated mineralization. Few scattered remote bilateral cerebellar infarcts noted as well. Patchy foci of restricted diffusion are seen involving the posterior right basal ganglia, immediately adjacent to the area prior infarction (series 5, image 79). Mild extension into the adjacent right external capsule. Additional patchy involvement of the right periatrial white matter (series 5, image 75) as well as the right occipital cortex (series 5, image 74). No significant mass effect. Scattered foci of susceptibility artifact at the right temporal occipital region is new from previous, and could reflect associated petechial hemorrhage (series 18, image 27). Subtle patchy cortical involvement of the right frontal operculum noted as well (a series 5, image 77). No other evidence for acute or subacute ischemia. Gray-white matter differentiation otherwise maintained. No other acute intracranial hemorrhage. 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 No mass lesion, midline shift or mass effect.  No hydrocephalus or extra-axial fluid collection. Pituitary gland suprasellar region normal. Midline structures intact. No abnormal enhancement. Pituitary gland and suprasellar region are normal. Midline structures intact and normal. Vascular: Major intracranial vascular flow voids are maintained. Skull and upper cervical spine: Craniocervical junction within normal limits. Bone marrow signal intensity normal. No focal marrow replacing lesion. No scalp soft tissue abnormality. Sinuses/Orbits: Patient status post bilateral ocular lens replacement.  Scattered mucosal thickening noted within the ethmoidal air cells. Paranasal sinuses are otherwise clear. No significant mastoid effusion. Inner ear structures grossly normal. Other: None. MRA HEAD FINDINGS ANTERIOR CIRCULATION: Visualized distal cervical segments of the internal carotid arteries are patent with antegrade flow. Petrous, cavernous, and supraclinoid segments patent without stenosis or other abnormality. A1 segments widely patent. Normal anterior communicating artery complex. Anterior cerebral arteries patent to their distal aspects without stenosis. Left M1 segment widely patent, with short-segment fenestration proximally. Normal left MCA bifurcation. Distal left MCA branches well perfused. Right M1 segment patent proximally. Severe stenosis involving the distal right M1 segment to the level of the bifurcation, stable from previous (series 1053, image 12). Right MCA branches demonstrate decreased perfusion but remain patent distally. Diffuse small vessel atheromatous irregularity seen about the MCA branches bilaterally. POSTERIOR CIRCULATION: Both V4 segments patent to the vertebrobasilar junction without stenosis. Left vertebral artery slightly dominant. Both PICA origins patent and normal. Basilar patent to its distal aspect without stenosis. Superior cerebellar arteries patent bilaterally. Left PCA supplied via the basilar. Fetal type origin of the right PCA. Both PCAs well perfused to their distal aspects without stenosis. No intracranial aneurysm. MRA NECK FINDINGS AORTIC ARCH: Examination technically limited by timing of the contrast bolus. Partially visualized aortic arch grossly within normal limits for caliber with normal 3 vessel morphology. No hemodynamically significant stenosis seen about the origin of the great vessels. RIGHT CAROTID SYSTEM: Visualized right CCA patent from its origin to the bifurcation without stenosis. Mild atheromatous irregularity about the right carotid  bulb/proximal right ICA with associated estimated stenosis of up to 30-40% stenosis by NASCET criteria. Right ICA patent distally without stenosis, evidence for dissection or occlusion. LEFT CAROTID SYSTEM: Left CCA patent from its origin to the bifurcation without stenosis. Mild atheromatous irregularity about the left carotid bulb/proximal left ICA without hemodynamically significant stenosis. Left ICA patent distally without stenosis, evidence for dissection or occlusion. VERTEBRAL ARTERIES: Both vertebral arteries arise from the subclavian arteries. Left vertebral artery dominant. Origins of the vertebral arteries not well evaluated on this technically limited exam. Visualized portions of the vertebral arteries patent without stenosis, evidence for dissection or occlusion. IMPRESSION: MRI HEAD: 1. Patchy small volume acute ischemic infarcts involving the right basal ganglia, right periatrial white matter, and overlying right frontal and occipital cortices as above. Probable mild associated petechial hemorrhage at the right occipital region. No frank hemorrhagic transformation or mass effect. 2. Interval evolution of previously identified right basal ganglia infarct, now largely chronic in appearance. 3. Additional chronic left frontal infarct, with a few additional scattered remote bilateral cerebellar infarcts. 4. 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 MRA HEAD: 1. 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 2. No other proximal high-grade or correctable stenosis about the major intracranial arterial circulation. 3. Diffuse small vessel atheromatous irregularity. MRA NECK: 1. Mild atheromatous irregularity about the carotid bifurcations/proximal ICAs bilaterally with estimated stenosis of up to 30-40% on the right. No significant stenosis about the left carotid artery system. 2. Widely patent  vertebral  arteries within the neck. Left vertebral artery dominant. Electronically Signed   By: Jeannine Boga M.D.   On: 08/21/2020 02:40   ECHOCARDIOGRAM COMPLETE  Result Date: 08/22/2020    ECHOCARDIOGRAM REPORT   Patient Name:   Cody Douglas Date of Exam: 08/22/2020 Medical Rec #:  [id number]      Height:       71.0 in Accession #:    [id number]     Weight:       142.1 lb Date of Birth:  [birth year]      BSA:          1.823 m Patient Age:    72 years       BP:           111/68 mmHg Patient Gender: M              HR:           96 bpm. Exam Location:  Inpatient Procedure: 2D Echo, Cardiac Doppler and Color Doppler Indications:    Stroke                 MV disorder  History:        Patient has prior history of Echocardiogram examinations, most                 recent 07/12/2020. Risk Factors:Hypertension and Current Smoker.                 Prostate cancer. ETOH abuse.  Sonographer:    Clayton Lefort RDCS (AE) Referring Phys: [place]  1. Left ventricular ejection fraction, by estimation, is 60 to 65%. The left ventricle has normal function. The left ventricle has no regional wall motion abnormalities. Left ventricular diastolic parameters are consistent with Grade I diastolic dysfunction (impaired relaxation).  2. Right ventricular systolic function is mildly reduced. The right ventricular size is normal. Tricuspid regurgitation signal is inadequate for assessing PA pressure.  3. There is a large density on the mitral valve annulus that is most consistent with severe mitral annular calcification. . The mitral valve is degenerative. No evidence of mitral valve regurgitation. No evidence of mitral stenosis. Severe mitral annular calcification.  4. There is a large density on the AV that is likely eccentric calcific deegeneration of the aortic valve but cannot rule out mass. In the setting of CVA, recommend TEE to better define the aortic valve. . The aortic valve is calcified. There is severe  calcifcation of the aortic valve. There is severe thickening of the aortic valve. Aortic valve regurgitation is moderate. No aortic stenosis is present. Aortic regurgitation PHT measures 359 msec. Aortic valve area, by VTI measures 3.77 cm. Aortic valve  mean gradient measures 5.0 mmHg. Aortic valve Vmax measures 1.49 m/s.  5. Aortic dilatation noted. There is mild dilatation of the aortic root, measuring 41 mm.  6. The inferior vena cava is normal in size with greater than 50% respiratory variability, suggesting right atrial pressure of 3 mmHg. FINDINGS  Left Ventricle: Left ventricular ejection fraction, by estimation, is 60 to 65%. The left ventricle has normal function. The left ventricle has no regional wall motion abnormalities. The left ventricular internal cavity size was normal in size. There is  no left ventricular hypertrophy. Left ventricular diastolic parameters are consistent with Grade I diastolic dysfunction (impaired relaxation). Normal left ventricular filling pressure. Right Ventricle: The right ventricular size is normal. No increase in right ventricular wall thickness. Right  ventricular systolic function is mildly reduced. Tricuspid regurgitation signal is inadequate for assessing PA pressure. Left Atrium: Left atrial size was normal in size. Right Atrium: Right atrial size was normal in size. Pericardium: There is no evidence of pericardial effusion. Mitral Valve: There is a large density on the mitral valve annulus that is most consistent with severe mitral annular calcification. The mitral valve is degenerative in appearance. There is moderate thickening of the anterior mitral valve leaflet(s). There is moderate calcification of the anterior mitral valve leaflet(s). Severe mitral annular calcification. No evidence of mitral valve regurgitation. No evidence of mitral valve stenosis. Tricuspid Valve: The tricuspid valve is normal in structure. Tricuspid valve regurgitation is trivial. No  evidence of tricuspid stenosis. Aortic Valve: There is a large density on the AV that is likely eccentric calcific deegeneration of the aortic valve but cannot rule out mass. In the setting of CVA, recommend TEE to better define the aortic valve. The aortic valve is calcified. There is  severe calcifcation of the aortic valve. There is severe thickening of the aortic valve. Aortic valve regurgitation is moderate. Aortic regurgitation PHT measures 359 msec. No aortic stenosis is present. Aortic valve mean gradient measures 5.0 mmHg. Aortic valve peak gradient measures 8.9 mmHg. Aortic valve area, by VTI measures 3.77 cm. Pulmonic Valve: The pulmonic valve was normal in structure. Pulmonic valve regurgitation is not visualized. No evidence of pulmonic stenosis. Aorta: Aortic dilatation noted. There is mild dilatation of the aortic root, measuring 41 mm. Venous: The inferior vena cava is normal in size with greater than 50% respiratory variability, suggesting right atrial pressure of 3 mmHg. IAS/Shunts: No atrial level shunt detected by color flow Doppler.  LEFT VENTRICLE PLAX 2D LVIDd:         4.40 cm  Diastology LVIDs:         3.10 cm  LV e' medial:    6.42 cm/s LV PW:         1.00 cm  LV E/e' medial:  10.2 LV IVS:        1.10 cm  LV e' lateral:   10.10 cm/s LVOT diam:     2.30 cm  LV E/e' lateral: 6.5 LV SV:         91 LV SV Index:   50 LVOT Area:     4.15 cm  RIGHT VENTRICLE             IVC RV Basal diam:  2.50 cm     IVC diam: 1.80 cm RV S prime:     12.00 cm/s TAPSE (M-mode): 1.6 cm LEFT ATRIUM             Index       RIGHT ATRIUM           Index LA diam:        3.10 cm 1.70 cm/m  RA Area:     13.40 cm LA Vol (A2C):   28.6 ml 15.68 ml/m RA Volume:   31.20 ml  17.11 ml/m LA Vol (A4C):   26.9 ml 14.75 ml/m LA Biplane Vol: 30.5 ml 16.73 ml/m  AORTIC VALVE AV Area (Vmax):    3.43 cm AV Area (Vmean):   3.20 cm AV Area (VTI):     3.77 cm AV Vmax:           149.00 cm/s AV Vmean:          111.000 cm/s AV VTI:  0.240 m AV Peak Grad:      8.9 mmHg AV Mean Grad:      5.0 mmHg LVOT Vmax:         123.00 cm/s LVOT Vmean:        85.400 cm/s LVOT VTI:          0.218 m LVOT/AV VTI ratio: 0.91 AI PHT:            359 msec  AORTA Ao Root diam: 4.10 cm MITRAL VALVE MV Area (PHT): 4.29 cm     SHUNTS MV Decel Time: 177 msec     Systemic VTI:  0.22 m MV E velocity: 65.60 cm/s   Systemic Diam: 2.30 cm MV A velocity: 108.00 cm/s MV E/A ratio:  0.61 Fransico Him MD Electronically signed by Fransico Him MD Signature Date/Time: 08/22/2020/3:25:05 PM    Final    ECHO TEE  Result Date: 08/24/2020    TRANSESOPHOGEAL ECHO REPORT   Patient Name:   Cody Douglas Date of Exam: 08/24/2020 Medical Rec #:  [id number]      Height:       71.0 in Accession #:    [id number]     Weight:       142.1 lb Date of Birth:  11/14/1950      BSA:          1.823 m Patient Age:    17 years       BP:           116/69 mmHg Patient Gender: M              HR:           88 bpm. Exam Location:  Inpatient Procedure: Transesophageal Echo, Cardiac Doppler, Color Doppler and Saline            Contrast Bubble Study Indications:     Stroke I63.9  History:         Patient has prior history of Echocardiogram examinations, most                  recent 08/22/2020. Stroke, Signs/Symptoms:Dyspnea; Risk                  Factors:Hypertension. GERD. Cancer. ETOH abuse.  Sonographer:     Jonelle Sidle Dance Referring Phys:  [id number] Leanor Kail Diagnosing Phys: Mertie Moores MD PROCEDURE: The transesophogeal probe was passed without difficulty through the esophogus of the patient. Sedation performed by different physician. The patient was monitored while under deep sedation. Anesthestetic sedation was provided intravenously by Anesthesiology: 144.61m of Propofol, 668mof Lidocaine. The patient developed no complications during the procedure. IMPRESSIONS  1. Left ventricular ejection fraction, by estimation, is 60 to 65%. The left ventricle has normal function.  2. Right  ventricular systolic function is normal. The right ventricular size is normal.  3. No left atrial/left atrial appendage thrombus was detected.  4. The mitral valve is grossly normal. Mild to moderate mitral valve regurgitation.  5. 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 This mass appears to be more mobile than on his last echo on Jul 12, 2020. . The aortic valve is abnormal. Aortic valve regurgitation is moderate to severe. FINDINGS  Left Ventricle: Left ventricular ejection fraction, by estimation, is 60 to 65%. The left ventricle has normal function. The left ventricular internal cavity size was normal in size. Right Ventricle: The right ventricular size is normal. Right vetricular wall thickness was not well  visualized. Right ventricular systolic function is normal. Left Atrium: Left atrial size was normal in size. No left atrial/left atrial appendage thrombus was detected. Right Atrium: Right atrial size was normal in size. Pericardium: There is no evidence of pericardial effusion. Mitral Valve: The mitral valve is grossly normal. Mild to moderate mitral annular calcification. Mild to moderate mitral valve regurgitation. Tricuspid Valve: The tricuspid valve is grossly normal. Tricuspid valve regurgitation is mild. 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 This mass appears to be more mobile than on his last echo on Jul 12, 2020. The aortic valve is abnormal. Aortic valve regurgitation is moderate to severe. Pulmonic Valve: The pulmonic valve was grossly normal. Pulmonic valve regurgitation is mild. Aorta: The aortic root and ascending aorta are structurally normal, with no evidence of dilitation. IAS/Shunts: No atrial level shunt detected by color flow Doppler. Agitated saline contrast was given intravenously to evaluate for intracardiac shunting. Mertie Moores MD Electronically signed by Mertie Moores MD Signature Date/Time: 08/24/2020/4:33:11 PM    Final    CT  HEAD CODE STROKE WO CONTRAST  Result Date: 08/20/2020 CLINICAL DATA:  Code stroke.  Weakness and facial droop EXAM: CT HEAD WITHOUT CONTRAST TECHNIQUE: Contiguous axial images were obtained from the base of the skull through the vertex without intravenous contrast. COMPARISON:  MRI 06/30/2020 FINDINGS: Brain: There is no acute intracranial hemorrhage, mass effect, or edema. No acute appearing loss of gray-white differentiation. Infarction of the right corona radiata and basal ganglia seen on prior MRI is identified. There is a chronic left frontal infarction involving the operculum. There may be a chronic cortical infarct of the posterior right opercular region. Additional patchy hypoattenuation in the supratentorial white matter is nonspecific but probably reflects chronic microvascular ischemic changes. Prominence of the ventricles and sulci reflects generalized parenchymal volume loss. No extra-axial collection. Vascular: No hyperdense vessel. There are foci of calcification along the right M1 and M2 MCA. Skull: Unremarkable. Sinuses/Orbits: No acute finding. Other: Mastoid air cells are clear. ASPECTS ([place] Stroke Program Early CT Score) - Ganglionic level infarction (caudate, lentiform nuclei, internal capsule, insula, M1-M3 cortex): 7 - Supraganglionic infarction (M4-M6 cortex): 3 Total score (0-10 with 10 being normal): 10 IMPRESSION: There is no acute intracranial hemorrhage or evidence of acute infarction. ASPECT score is 10. Chronic infarcts as described. Foci of calcification along the right M1 and M2 MCA. Could reflect calcified emboli. Right M1 MCA irregularity on the prior MRA is likely related. These results were communicated to Dr. Lorrin Goodell at 7:18 pm on 08/20/2020 by text page via the [facility] messaging system. Electronically Signed   By: Macy Mis M.D.   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 This mass  appears to be more mobile than on his last echo on Jul 12, 2020. . The aortic valve is abnormal. Aortic valve regurgitation is moderate to severe.  -Primary team discussed with Dr. Erlinda Hong and his aspirin stopped and started on IV heparin for anticoagulation. -Hemoptysis on Plavix and high-dose aspirin last admission>> 4 watch for any bleeding issue on IV heparin - Patient likely needs surgery but recent dx of lung cancer  - ? Bacterial vs non bacterial endocarditis. Will get blood culture.   2.  Squamous cell lung cancer -Had 2 treatment of chemoradiation  3.  Acute ischemic  stroke -Per primary team -Likely due to AV mass/endocarditis   4. NSVT - Patient had 4 beats for NSVT on 5/13 - Continue Toprol XL 50m qd if tolerates - Keep K > 4 and Mg > 2  Risk Assessment/Risk Scores:   For questions or updates, please contact [facility] Please consult [email or web address] for contact info under    SJarrett Soho PUtah 08/24/2020 4:57 PM   Patient seen and examined and agree with VRobbie Lis PA as detailed above.  In brief, the patient is a 70y.o. male with a hx of hypertension, stroke, prostate cancer, recent diagnosis of lung cancer  (on chemoradiation) ongoing tobacco abuse), recently diagnosed AoV mass, and alcohol abuse who presented wit a recurrent stroke with repeat TEE showing interval increase in aortic regurgitation and persistent aortic valve mass for which cardiology has been consulted.  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 This is very likely to be the source of his recurrent strokes. Mainstay of treatment of NBTE is  anticoagulation with rare surgical intervention in patients who do not have significant malignancy burden and are acceptable risk for the procedure. Unfortunately, the patient is not a surgical candidate and therefore likely the only option for him is medical therapy. Agree with heparin gtt for now and will repeat blood cultures to ensure no evidence of bacteremia to suggest infective endocarditis. Overall, prognosis is very poor and the patient is high risk for recurrent thromboembolic episodes.   Exam: GEN: Chronically ill appearing and frail  Neck: No JVD Cardiac: RR, soft diastolic murmur Respiratory: Clear to auscultation bilaterally. 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 Very likely to be the source of recurrent strokes -Will repeat blood cultures to assess for infective endocarditis as this is also a possibility, however, cultures on last admission negative and no localizing symptoms of infection -Mainstay of treatment of NBTE is AC; agree with heparin gtt if patient is able to tolerate it -Unfortunately, patient is not likely to be a surgical candidate for AVR due to comorbidities, frailty and overall poor prognosis and will likely need to be managed medically -Overall, prognosis is poor and patient is at high risk of repeat thromboembolic events even with anticoagulation; may need to consider palliative care consultation going forward -NSVT likely driven by moderate-to-severe AR; continue metop if BP allows but okay to stop if needed. If more frequent burden, we can start amio if blood pressure room needed from a CVA perspective  Plan discussed with the patient's daughter, DHinton Dyer at length.  HGwyndolyn Kaufman MD

## 2020-08-24 NOTE — Progress Notes (Signed)
Progress Note  Patient Name: AERIK POLAN Date of Encounter: 08/25/2020  [facility] Cardiologist: None   Subjective   No events overnight. States his breathing is okay this morning. Tolerating heparin. Hemoglobin 10.8. Blood pressures soft. Remains in NSR.  Inpatient Medications    Scheduled Meds:  ALPRAZolam  0.5 mg Oral TID   atorvastatin  40 mg Oral Daily   feeding supplement  237 mL Oral BID BM   folic acid  1 mg Oral Daily   metoprolol succinate  25 mg Oral Daily   multivitamin with minerals  1 tablet Oral Daily   nystatin  5 mL Mouth/Throat QID   polyethylene glycol  17 g Oral Daily   thiamine  100 mg Oral Daily   Or   thiamine  100 mg Intravenous Daily   Continuous Infusions:  sodium chloride 20 mL/hr at 08/24/20 1138   heparin 1,100 Units/hr (08/25/20 0449)   PRN Meds: acetaminophen **OR** acetaminophen (TYLENOL) oral liquid 160 mg/5 mL **OR** acetaminophen, bisacodyl, bisacodyl, oxyCODONE-acetaminophen   Vital Signs    Vitals:   08/24/20 1542 08/24/20 1917 08/24/20 2338 08/25/20 0808  BP: 134/73 115/73 113/71 102/71  Pulse: 86 83 83 86  Resp: 20 19 19 20   Temp: 98.3 F (36.8 C) 98.2 F (36.8 C) 98.2 F (36.8 C) 98.4 F (36.9 C)  TempSrc: Oral Oral Oral Oral  SpO2: 97% 95% 97% 92%  Weight:      Height:        Intake/Output Summary (Last 24 hours) at 08/25/2020 1016 Last data filed at 08/25/2020 0450 Gross per 24 hour  Intake 234.13 ml  Output 950 ml  Net -715.87 ml   Last 3 Weights 08/20/2020 08/17/2020 08/03/2020  Weight (lbs) 142 lb 1.6 oz 142 lb 1.6 oz 141 lb  Weight (kg) 64.456 kg 64.456 kg 63.957 kg      Telemetry    NSR, rare OVCs - Personally Reviewed  ECG    No new tracing - Personally Reviewed  Physical Exam   GEN: Chronically ill appearing, frail Neck: No JVD Cardiac: RRR, soft diastolic murmur. No rubs Respiratory: Clear to auscultation bilaterally. GI: Soft, nontender, non-distended  MS: No edema; No deformity.  Thin Neuro:  Nonfocal  Psych: Normal affect   Labs    High Sensitivity Troponin:  No results for input(s): TROPONINIHS in the last 720 hours.    Chemistry Recent Labs  Lab 08/20/20 1901 08/20/20 2016 08/22/20 0843 08/23/20 0337  NA 138 140 137 138  K 3.8 3.6 3.6 4.1  CL 101 103 104 105  CO2 25  --  26 28  GLUCOSE 118* 109* 108* 119*  BUN 9 11 13 13   CREATININE 0.51* 0.40* 0.45* 0.57*  CALCIUM 9.1  --  9.0 8.8*  PROT 6.5  --  6.0* 5.7*  ALBUMIN 3.3*  --  3.0* 2.8*  AST 18  --  16 14*  ALT 15  --  14 12  ALKPHOS 93  --  99 86  BILITOT 0.8  --  0.7 0.4  GFRNONAA >60  --  >60 >60  ANIONGAP 12  --  7 5     Hematology Recent Labs  Lab 08/20/20 1901 08/20/20 2016 08/22/20 0843 08/25/20 0324  WBC 6.6  --  5.3 4.6  RBC 3.97*  --  3.90* 3.61*  HGB 11.5* 13.9 11.5* 10.8*  HCT 36.8* 41.0 36.3* 33.2*  MCV 92.7  --  93.1 92.0  MCH 29.0  --  29.5  29.9  MCHC 31.3  --  31.7 32.5  RDW 14.1  --  14.4 14.9  PLT 316  --  318 283    BNPNo results for input(s): BNP, PROBNP in the last 168 hours.   DDimer No results for input(s): DDIMER in the last 168 hours.   Radiology    ECHO TEE  Result Date: 08/24/2020    TRANSESOPHOGEAL ECHO REPORT   Patient Name:   SHERWIN HOLLINGSHED Date of Exam: 08/24/2020 Medical Rec #:  [id number]      Height:       71.0 in Accession #:    [id number]     Weight:       142.1 lb Date of Birth:  15-Aug-1950      BSA:          1.823 m Patient Age:    70 years       BP:           116/69 mmHg Patient Gender: M              HR:           88 bpm. Exam Location:  Inpatient Procedure: Transesophageal Echo, Cardiac Doppler, Color Doppler and Saline            Contrast Bubble Study Indications:     Stroke I63.9  History:         Patient has prior history of Echocardiogram examinations, most                  recent 08/22/2020. Stroke, Signs/Symptoms:Dyspnea; Risk                  Factors:Hypertension. GERD. Cancer. ETOH abuse.  Sonographer:     Jonelle Sidle Dance Referring Phys:   [id number] Leanor Kail Diagnosing Phys: Mertie Moores MD PROCEDURE: The transesophogeal probe was passed without difficulty through the esophogus of the patient. Sedation performed by different physician. The patient was monitored while under deep sedation. Anesthestetic sedation was provided intravenously by Anesthesiology: 144.01mg  of Propofol, 60mg  of Lidocaine. The patient developed no complications during the procedure. IMPRESSIONS  1. Left ventricular ejection fraction, by estimation, is 60 to 65%. The left ventricle has normal function.  2. Right ventricular systolic function is normal. The right ventricular size is normal.  3. No left atrial/left atrial appendage thrombus was detected.  4. The mitral valve is grossly normal. Mild to moderate mitral valve regurgitation.  5. 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 This mass appears to be more mobile than on his last echo on Jul 12, 2020. . The aortic valve is abnormal. Aortic valve regurgitation is moderate to severe. FINDINGS  Left Ventricle: Left ventricular ejection fraction, by estimation, is 60 to 65%. The left ventricle has normal function. The left ventricular internal cavity size was normal in size. Right Ventricle: The right ventricular size is normal. Right vetricular wall thickness was not well visualized. Right ventricular systolic function is normal. Left Atrium: Left atrial size was normal in size. No left atrial/left atrial appendage thrombus was detected. Right Atrium: Right atrial size was normal in size. Pericardium: There is no evidence of pericardial effusion. Mitral Valve: The mitral valve is grossly normal. Mild to moderate mitral annular calcification. Mild to moderate mitral valve regurgitation. Tricuspid Valve: The tricuspid valve is grossly normal. Tricuspid valve regurgitation is mild. 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 This mass appears to be  more mobile than on his last  echo on Jul 12, 2020. The aortic valve is abnormal. Aortic valve regurgitation is moderate to severe. Pulmonic Valve: The pulmonic valve was grossly normal. Pulmonic valve regurgitation is mild. Aorta: The aortic root and ascending aorta are structurally normal, with no evidence of dilitation. IAS/Shunts: No atrial level shunt detected by color flow Doppler. Agitated saline contrast was given intravenously to evaluate for intracardiac shunting. Mertie Moores MD Electronically signed by Mertie Moores MD Signature Date/Time: 08/24/2020/4:33:11 PM    Final     Cardiac Studies   TEE 08/24/20 IMPRESSIONS   1. Left ventricular ejection fraction, by estimation, is 60 to 65%. The  left ventricle has normal function.   2. Right ventricular systolic function is normal. The right ventricular  size is normal.   3. No left atrial/left atrial appendage thrombus was detected.   4. The mitral valve is grossly normal. Mild to moderate mitral valve  regurgitation.   5. 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 This mass  appears to be more mobile than on his last echo on Jul 12, 2020. . The  aortic valve is abnormal. Aortic valve regurgitation is moderate to  severe.   TTE 08/22/2020 1. Left ventricular ejection fraction, by estimation, is 60 to 65%. The  left ventricle has normal function. The left ventricle has no regional  wall motion abnormalities. Left ventricular diastolic parameters are  consistent with Grade I diastolic  dysfunction (impaired relaxation).   2. Right ventricular systolic function is mildly reduced. The right  ventricular size is normal. Tricuspid regurgitation signal is inadequate  for assessing PA pressure.   3. There is a large density on the mitral valve annulus that is most  consistent with severe mitral annular calcification. . The mitral valve is  degenerative. No evidence of mitral valve regurgitation. No evidence of  mitral stenosis. Severe mitral  annular  calcification.   4. There is a large density on the AV that is likely eccentric calcific  deegeneration of the aortic valve but cannot rule out mass. In the setting  of CVA, recommend TEE to better define the aortic valve. . The aortic  valve is calcified. There is severe  calcifcation of the aortic valve. There is severe thickening of the aortic  valve. Aortic valve regurgitation is moderate. No aortic stenosis is  present. Aortic regurgitation PHT measures 359 msec. Aortic valve area, by  VTI measures 3.77 cm. Aortic valve   mean gradient measures 5.0 mmHg. Aortic valve Vmax measures 1.49 m/s.   5. Aortic dilatation noted. There is mild dilatation of the aortic root,  measuring 41 mm.   6. The inferior vena cava is normal in size with greater than 50%  respiratory variability, suggesting right atrial pressure of 3 mmHg.  Patient Profile     70 y.o. male with a hx of hypertension, stroke, prostate cancer, recent diagnosis of lung cancer  (on chemoradiation) ongoing tobacco abuse), recently diagnosed AoV mass, and alcohol abuse who presented with a recurrent stroke with repeat TEE showing interval increase in aortic regurgitation and persistent aortic valve mass for which cardiology has been consulted.    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 Has progressed since prior TEE on 07/12/20. 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 This is very likely to be the source of his recurrent strokes. Mainstay of treatment of NBTE is anticoagulation with rare surgical intervention in patients who do not have significant  malignancy burden and are acceptable risk for the procedure. Unfortunately, the patient is not a surgical candidate and therefore likely the only option for him is medical therapy. On heparin gtt for now with close monitoring given hemoptysis with DAPT in the past.  Repeat blood cultures also sent to ensure no evidence of bacteremia to suggest infective endocarditis. Overall, prognosis is very poor and the patient is high risk for recurrent thromboembolic episodes. -Continue heparin gtt as tolerated; recommend LMWH long-term going forward. 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 Neurology following. -Continue heparin gtt as tolerated; recommend LMWH long-term going forward -As detailed above, if too difficult for the patient to be on LMWH, can do warfarin (goal INR 2-3) vs apixaban 5mg  BID but data for these agents is weaker -Management of aortic valve mass as above -Neurology following  #NSVT: Very brief episode on the montior. Likely driven by moderate AR.  -Okay to be off metoprolol if needs BP room (will hold now) -If increased ectopy burden, can add back low dose metop vs start [facility]  Cardiology will sign-off. Please do not hesitate to call back with questions or concerns.    [facility] will sign off.   Medication Recommendations:  Continue anticoagulation as tolerated Other recommendations (labs, testing, etc):  N/A Follow up as an outpatient:  can follow-up with Cardiology as out-patient pending [place]  discussion/patient wishes  For questions or updates, please contact [facility] Please consult [email or web address] for contact info under        Signed, Freada Bergeron, MD  08/25/2020, 10:16 AM

## 2020-08-24 NOTE — Progress Notes (Addendum)
STROKE TEAM PROGRESS NOTE   INTERVAL HISTORY  No acute events overnight. Sitting up in the chair.  Afebrile.  Vital signs stable.  Neuro exam unchanged  Vitals:   08/24/20 1215 08/24/20 1230 08/24/20 1322 08/24/20 1542  BP: 102/62 (!) 115/58  134/73  Pulse: 86 79  86  Resp: (!) 26 (!) _0 Temp:    98.3 F (36.8 C)  TempSrc:    Oral  SpO2: 95% 97%  97%  Weight:      Height:       CBC:  Recent Labs  Lab 08/20/20 1901 08/20/20 2016 08/22/20 0843  WBC 6.6  --  5.3  NEUTROABS 5.4  --   --   HGB 11.5* 13.9 11.5*  HCT 36.8* 41.0 36.3*  MCV 92.7  --  93.1  PLT 316  --  676   Basic Metabolic Panel:  Recent Labs  Lab 08/21/20 0141 08/22/20 0843 08/23/20 0337  NA  --  137 138  K  --  3.6 4.1  CL  --  104 105  CO2  --  26 28  GLUCOSE  --  108* 119*  BUN  --  13 13  CREATININE  --  0.45* 0.57*  CALCIUM  --  9.0 8.8*  MG 1.9  --  1.9  PHOS 3.0  --   --    Lipid Panel:  Recent Labs  Lab 08/21/20 0141  CHOL 141  TRIG 88  HDL 27*  CHOLHDL 5.2  VLDL 18  LDLCALC 96   HgbA1c:  Recent Labs  Lab 08/21/20 0141  HGBA1C 6.0*   Urine Drug Screen: No results for input(s): LABOPIA, COCAINSCRNUR, LABBENZ, AMPHETMU, THCU, LABBARB in the last 168 hours.    IMAGING past 24 hours  CT head Result read: 08/20/2020 Impression: There is no acute intracranial hemorrhage or evidence of acute infarction. ASPECT score is 10.   Chronic infarcts as described.  Foci of calcification along the right M1 and M2 MCA. Could reflect calcified emboli. Right M1 MCA irregularity on the prior MRA is likely related.   MRI HEAD: Result read: 08/21/2020 Impression: 1. Patchy small volume acute ischemic infarcts involving the right basal ganglia, right periatrial white matter, and overlying right frontal and occipital cortices. Probable mild associated petechial hemorrhage at the right occipital region. No frank hemorrhagic transformation or mass effect. 2. Interval evolution of  previously identified right basal ganglia infarct, now largely chronic in appearance. 3. Additional chronic left frontal infarct, with a few additional scattered remote bilateral cerebellar infarcts. 4. 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   MRA HEAD  Result read: 08/21/2020 Impression: 1. 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 2. No other proximal high-grade or correctable stenosis about the major intracranial arterial circulation. 3. Diffuse small vessel atheromatous irregularity.   MRA NECK: Result read: 08/21/2020 Impression: 1. Mild atheromatous irregularity about the carotid bifurcations/proximal ICAs bilaterally with estimated stenosis of up to 30-40% on the right. No significant stenosis about the left carotid artery system. 2. Widely patent vertebral arteries within the neck. Left vertebral artery dominant.  Echo Result read: 08/22/2020 Impression  1. Left ventricular ejection fraction, by estimation, is 60 to 65%. The  left ventricle has normal function. The left ventricle has no regional  wall motion abnormalities. Left ventricular diastolic parameters are  consistent with Grade I diastolic  dysfunction (impaired relaxation).   2. Right ventricular systolic function is  mildly reduced. The right  ventricular size is normal. Tricuspid regurgitation signal is inadequate  for assessing PA pressure.   3. There is a large density on the mitral valve annulus that is most  consistent with severe mitral annular calcification. . The mitral valve is  degenerative. No evidence of mitral valve regurgitation. No evidence of  mitral stenosis. Severe mitral  annular calcification.   4. There is a large density on the AV that is likely eccentric calcific  deegeneration of the aortic valve but cannot rule out mass. In the setting  of CVA, recommend TEE to better define  the aortic valve. . The aortic  valve is calcified. There is severe  calcifcation of the aortic valve. There is severe thickening of the aortic  valve. Aortic valve regurgitation is moderate. No aortic stenosis is  present. Aortic regurgitation PHT measures 359 msec. Aortic valve area, by  VTI measures 3.77 cm. Aortic valve   mean gradient measures 5.0 mmHg. Aortic valve Vmax measures 1.49 m/s.   5. Aortic dilatation noted. There is mild dilatation of the aortic root,  measuring 41 mm.   6. The inferior vena cava is normal in size with greater than 50%  respiratory variability, suggesting right atrial pressure of 3 mmHg.     PHYSICAL EXAM General: Laying comfortably in bed; in no acute distress. HENT: Normal oropharynx and mucosa. Normal external appearance of ears and nose. Neck: Supple, no pain or tenderness CV: No JVD. No peripheral edema. Pulmonary: Symmetric Chest rise. Normal respiratory effort. Abdomen: Soft to touch, non-tender. Ext: No cyanosis, edema, or deformity  Skin: No rash. Normal palpation of skin.    Mental status/Cognition: Alert, oriented to self, place, month and year, good attention. Speech/language: mildly dysarthric speech, fluent, comprehension intact, object naming intact, repetition intact. Cranial nerves:   CN II Pupils equal and reactive to light, no VF deficits   CN III,IV,VI EOM intact, no gaze preference or deviation, no nystagmus   CN V normal sensation in V1, V2, and V3 segments bilaterally   CN VII Mild L facial droop   CN VIII normal hearing to speech   CN IX & X normal palatal elevation, no uvular deviation   CN XI 5/5 head turn and 5/5 shoulder shrug bilaterally   CN XII midline tongue protrusion   Motor: Upper right extremity 5/5, right lower extremity 5/5, Left upper extremity 3/5 proximal, wrist extension 2/5, hand grip 3/5. Left lower extremity 3/5 proximal, knee extension 4-/5 and ankle DF/PF 5-/5.  Tone and bulk:normal tone throughout;  no atrophy noted Sensory: Pinprick and light touch intact throughout, bilaterally Cerebellar: finger-to-nose intact on right  Gait: deferred   ASSESSMENT/PLAN [name] is a 70 y.o. male with history of HTN, prostate cancer, stage 3 lung cancer on chemo and radiation, prior smoker, prior external capsule and caudate infarct who presents with left sided weakness, left facial droop, and dysarthria.  He did not receive tPA because he was outside the window.   Stroke:  right MCA scattered infarcts in the setting of severe distal right M1 stenosis. Code Stroke: no acute intracranial hemorrhage or evidence of acute infarction. ASPECT score is 10. MRI Patchy small volume acute ischemic infarcts involving the right basal ganglia, right periatrial white matter, and overlying right frontal and occipital cortices MRA head/neck severe distal right M1 stenosis with diminished perfusion distally, stable from previous. 2D Echo EF 60-65%. No wall motion abnormality, large density on the MV annulus.  There is  large density on the AV that is likely eccentric calcific degeneration of the aortic valve but cannot rule out mass.   TEE showed mobile mass on the [place] of the aortic valve . The mass is small - medium in size.   Appears larger and more mobile than previous TEE on Jul 12, 2020 LDL 96 HgbA1c 6.3 VTE prophylaxis - lovenox Diet: heart healthy aspirin 81 mg daily prior to admission, was on DAPT with ASA 3100m and Plavix 765mbut he developed hemoptysis and hematuria, had been on ASA 81. Now on aspirin 162 mg daily. However, given the mobile AV mass, put on heparin IV per stroke protocol. Further AC regimen per cardiology Therapy recommendations:  SNF Disposition:  pending  AV mass 5/2 TEE Abnormal aortic valve with small mass associated with right coronary  cusp (cannot R/O vegetation); the right cusp appears to be perforated; mild to moderate AI. 6/14 TEE mobile mass n the ventricular aspect of  the AV. 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 Further AC regimen per cardiology.  Hx stroke:  07/02/20 he was found to have right BG infarct with high grade stenosis of right M1. Recommended ASA 325 and plavix 75 DAPT for 3 months given the intracranial stenosis. However, pt was found to have hemoptysis, DAPT was on hold. Placed on ASA 3253mnly for lower bleeding risk.  On 5/2 he developed hematuria, aspirin was then reduced to 53m68m Hypertension Home meds:  Toprol XL 25mg21mly stable Avoid low BP Long term BP goal 130-150 given high-grade right MCA stenosis  MV abnormality 07/01/2020 mitral Valve severe calcified bulky lesions on posterior and anterior leaflets favor vegetations rather than MAC 07/12/2020 The mitral valve is abnormal. Mild mitral valve regurgitation. Moderate mitral annular calcification. 6/12 TTE There is a large density on the mitral valve annulus that is most consistent with severe mitral annular calcification. 6/14 TEE mitral valve is grossly normal. Mild to moderate mitral annular calcification. Mild to moderate mitral valve regurgitation.  Echo pending  Hyperlipidemia Home meds:  none LDL 96, goal < 70 Atorvastatin 40mg 35my Continue statin at discharge  Diabetes type II controlled Home meds:  none HgbA1c 6.3, goal < 7.0 CBGs SSI  Tobacco abuse Current smoker Smoking cessation counseling provided Pt is willing to quit   Alcohol abuse Heavy alcohol in the past, 1 bottle of vodka once a week Alcohol limitation education provided Recommend CIWA protocol Add B1/MVI/FA  Other Stroke Risk Factors Advanced Age >/= 65  Mi48aines  Other Active  Problems GERD Prostate Cancer NSCLC stage 3: currently receiving chemo and radiation therapy Anxiety: xanax 0.5mg TI86mHospital day # 4   JindongRosalin HawkingD Stroke Neurology 08/24/2020 8:39 PM   To contact Stroke Continuity provider, please refer to [email or web address], contact General Neurology

## 2020-08-24 NOTE — Anesthesia Postprocedure Evaluation (Signed)
Anesthesia Post Note  Patient: Cody Douglas  Procedure(s) Performed: TRANSESOPHAGEAL ECHOCARDIOGRAM (TEE) BUBBLE STUDY     Patient location during evaluation: PACU Anesthesia Type: MAC Level of consciousness: awake and alert Pain management: pain level controlled Vital Signs Assessment: post-procedure vital signs reviewed and stable Respiratory status: spontaneous breathing, nonlabored ventilation and respiratory function stable Cardiovascular status: blood pressure returned to baseline and stable Postop Assessment: no apparent nausea or vomiting Anesthetic complications: no   No notable events documented.  Last Vitals:  Vitals:   08/24/20 1026 08/24/20 1111  BP: 121/84 122/70  Pulse: 86   Resp:  16  Temp:    SpO2:  100%    Last Pain:  Vitals:   08/24/20 1111  TempSrc:   PainSc: [place]

## 2020-08-24 NOTE — Anesthesia Preprocedure Evaluation (Addendum)
Anesthesia Evaluation  Patient identified by MRN, date of birth, ID band Patient awake    Reviewed: Allergy & Precautions, NPO status , Patient's Chart, lab work & pertinent test results, reviewed documented beta blocker date and time   Airway Mallampati: II  TM Distance: >3 FB Neck ROM: Full    Dental  (+) Edentulous Upper, Edentulous Lower   Pulmonary shortness of breath and with exertion, Current Smoker and Patient abstained from smoking.,  Quit smoking a month ago, 100 pack year history    Pulmonary exam normal breath sounds clear to auscultation       Cardiovascular hypertension, Pt. on medications and Pt. on home beta blockers +CHF (mild RV dysfunction, grade 2 diastolic dysfunction)  Normal cardiovascular exam+ Valvular Problems/Murmurs (mod AI) AI  Rhythm:Regular Rate:Normal  Echo 08/22/20: 1. Left ventricular ejection fraction, by estimation, is 60 to 65%. The  left ventricle has normal function. The left ventricle has no regional  wall motion abnormalities. Left ventricular diastolic parameters are  consistent with Grade I diastolic  dysfunction (impaired relaxation).  2. Right ventricular systolic function is mildly reduced. The right  ventricular size is normal. Tricuspid regurgitation signal is inadequate  for assessing PA pressure.  3. There is a large density on the mitral valve annulus that is most  consistent with severe mitral annular calcification. . The mitral valve is  degenerative. No evidence of mitral valve regurgitation. No evidence of  mitral stenosis. Severe mitral  annular calcification.  4. There is a large density on the AV that is likely eccentric calcific  deegeneration of the aortic valve but cannot rule out mass. In the setting  of CVA, recommend TEE to better define the aortic valve. . The aortic  valve is calcified. There is severe  calcifcation of the aortic valve. There is severe  thickening of the aortic  valve. Aortic valve regurgitation is moderate. No aortic stenosis is  present. Aortic regurgitation PHT measures 359 msec. Aortic valve area, by  VTI measures 3.77 cm. Aortic valve  mean gradient measures 5.0 mmHg. Aortic valve Vmax measures 1.49 m/s.  5. Aortic dilatation noted. There is mild dilatation of the aortic root,  measuring 41 mm.  6. The inferior vena cava is normal in size with greater than 50%  respiratory variability, suggesting right atrial pressure of 3 mmHg.    Neuro/Psych  Headaches, PSYCHIATRIC DISORDERS Anxiety CVA (april, residual L arm and leg weakness)    GI/Hepatic Neg liver ROS, GERD  Medicated and Controlled,  Endo/Other  negative endocrine ROS  Renal/GU negative Renal ROS  negative genitourinary   Musculoskeletal negative musculoskeletal ROS (+)   Abdominal   Peds  Hematology hct 36.3   Anesthesia Other Findings   Reproductive/Obstetrics negative OB ROS                            Anesthesia Physical Anesthesia Plan  ASA: 3  Anesthesia Plan: MAC   Post-op Pain Management:    Induction:   PONV Risk Score and Plan: 2 and Propofol infusion and TIVA  Airway Management Planned: Natural Airway and Simple Face Mask  Additional Equipment: None  Intra-op Plan:   Post-operative Plan:   Informed Consent: I have reviewed the patients History and Physical, chart, labs and discussed the procedure including the risks, benefits and alternatives for the proposed anesthesia with the patient or authorized representative who has indicated his/her understanding and acceptance.  Plan Discussed with: CRNA  Anesthesia Plan Comments:         Anesthesia Quick Evaluation

## 2020-08-24 NOTE — Progress Notes (Signed)
PROGRESS NOTE    JOACHIM CARTON   LSL:[id number]  DOB: 08/02/1950  DOA: 08/20/2020 PCP: Tamsen Roers, MD   Brief Narrative:  NYAIR DEPAULO is a 70 year old male who presents from [facility] who has non-small cell lung cancer and is on chemo and radiation,  hypertension,  nicotine abuse, alcohol abuse, prostate cancer who presents for worsening left-sided weakness. He was last hospitalized from 06/30/2020 through 07/15/2020 with an acute right-sided CVA with left-sided weakness and transferred to SNF for rehab. The head in the ED showed no acute infarcts  Subjective: No new complaints.     Assessment & Plan:   Principal Problem:   Acute ischemic stroke, multifocal  -Worsening weakness of his left arm and leg-there appears to be a left facial droop on exam but when asked to smile, his smile is equal -6/11 > MRI reveals patchy acute ischemic infarcts in the right basal ganglia, right parietal white matter, right frontal and occipital lobes and probable mild associated petechial hemorrhage in the right occipital region.  He has chronic infarcts in the right basal ganglia, left frontal and some scattered remote bilateral cerebral infarcts.  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 - MRA head/neck again reveals severe distal right M1 stenosis -He was initially placed on aspirin and Plavix when he was last here with the CVA but due to hemoptysis, Plavix was held and he was continued on aspirin 325 mg.  On 5/2 due to hematuria, aspirin was reduced to 81 mg daily.  H  -Also noted on TEE on 5/2 was a small mass on the aortic valve (cannot rule out vegetation) and the right cusp appeared to be perforated with mild to moderate AI -6/12 2  D ECHO> There is a large density on the AV that is likely eccentric calcific  deegeneration of the aortic valve but cannot rule out mass. In the setting  of CVA, recommend TEE to  better define the aortic valve. -continue aspirin 162 mg per neuro for now - LDL 96- cont atorvastatin  - today TEE reveals a mobile vegetation on the Ao valve which is larger and more mobile than last TEE.  - D/w Dr Erlinda Hong- stop ASA and start Heparin IV to see if he can tolerate full AC- he also recommends a higher BP (maybe hold Metoprolol)- He will see the patient later today- I will consult cardiology as well as he had some V-tach when off of the Metoprolol.   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    Time spent in minutes: 35 DVT prophylaxis: enoxaparin (LOVENOX) injection 40 mg Start: 08/20/20 2045  Code Status:  DO NOT RESUSCITATE Family Communication: daughter Shelbie Proctor on 6/13 Level of Care: Level of care: Telemetry Medical Disposition Plan:  Status is: Inpatient  Remains inpatient appropriate because:Inpatient level of care appropriate due to severity of illness Dispo: The patient is from: SNF              Anticipated d/c is to: SNF return to long term care with PT 3-4 times a week- insurance denied SNF level PT on P2P today              Patient currently is not medically stable to d/c.   Difficult to place patient No Consultants:   Neurology Cardiology  Procedures:  2 D ECHO TEE Antimicrobials:  Anti-infectives (From admission, onward)    None        Objective: Vitals:   08/24/20 1206 08/24/20 1215 08/24/20 1230 08/24/20 1322  BP: (!) 90/54 102/62 (!) 115/58   Pulse: 82 86 79   Resp: (!) 79 (!) 26 (!) 30 20  Temp:      TempSrc:      SpO2: 97% 95% 97%   Weight:      Height:        Intake/Output Summary (Last 24 hours) at 08/24/2020 1522 Last data filed at 08/24/2020 1322 Gross per 24 hour  Intake 234.13 ml  Output 850 ml  Net -615.87 ml    Filed Weights   08/20/20 1859  Weight: 64.5 kg    Examination: General exam: Appears comfortable  HEENT: PERRLA, oral mucosa moist, no sclera icterus or thrush Respiratory system: Clear to auscultation. Respiratory effort normal. Cardiovascular system: S1 & S2 heard, regular rate and rhythm Gastrointestinal system: Abdomen soft, non-tender, nondistended. Normal bowel sounds   Central nervous system: Alert and oriented x 3- left arm 0/5, left leg 5/5 Extremities: No cyanosis, clubbing or edema Skin: No rashes or ulcers Psychiatry:  Mood & affect appropriate.      Data Reviewed: I have personally reviewed following labs and imaging studies  CBC: Recent Labs  Lab 08/20/20 1901 08/20/20 2016 08/22/20 0843  WBC 6.6  --  5.3  NEUTROABS 5.4  --   --   HGB 11.5* 13.9 11.5*  HCT 36.8* 41.0 36.3*  MCV 92.7  --  93.1  PLT 316  --  409    Basic Metabolic Panel: Recent Labs  Lab 08/20/20 1901 08/20/20 2016 08/21/20 0141 08/22/20 0843 08/23/20 0337  NA 138 140  --  137 138  K 3.8 3.6  --  3.6 4.1  CL 101 103  --  104 105  CO2 25  --   --  26 28  GLUCOSE 118* 109*  --  108* 119*  BUN 9 11  --  13 13  CREATININE 0.51* 0.40*  --  0.45* 0.57*  CALCIUM 9.1  --   --  9.0 8.8*  MG  --   --  1.9  --  1.9  PHOS  --   --  3.0  --   --     GFR: Estimated Creatinine Clearance: 77.3 mL/min (A) (by C-G formula based on SCr of 0.57 mg/dL  (L)). Liver Function Tests: Recent Labs  Lab 08/20/20 1901 08/22/20 0843 08/23/20 0337  AST 18 16 14*  ALT 15 14 12   ALKPHOS 93 99 86  BILITOT 0.8 0.7 0.4  PROT 6.5 6.0* 5.7*  ALBUMIN 3.3* 3.0* 2.8*    No results for input(s): LIPASE, AMYLASE in the last 168 hours. No results  for input(s): AMMONIA in the last 168 hours. Coagulation Profile: Recent Labs  Lab 08/20/20 1901  INR 1.0    Cardiac Enzymes: No results for input(s): CKTOTAL, CKMB, CKMBINDEX, TROPONINI in the last 168 hours. BNP (last 3 results) No results for input(s): PROBNP in the last 8760 hours. HbA1C: No results for input(s): HGBA1C in the last 72 hours. CBG: Recent Labs  Lab 08/20/20 1902 08/23/20 2001  GLUCAP 106* 113*    Lipid Profile: No results for input(s): CHOL, HDL, LDLCALC, TRIG, CHOLHDL, LDLDIRECT in the last 72 hours.  Thyroid Function Tests: No results for input(s): TSH, T4TOTAL, FREET4, T3FREE, THYROIDAB in the last 72 hours. Anemia Panel: No results for input(s): VITAMINB12, FOLATE, FERRITIN, TIBC, IRON, RETICCTPCT in the last 72 hours. Urine analysis:    Component Value Date/Time   COLORURINE RED (A) 07/13/2020 1901   APPEARANCEUR TURBID (A) 07/13/2020 1901   LABSPEC  07/13/2020 1901    TEST NOT REPORTED DUE TO COLOR INTERFERENCE OF URINE PIGMENT   PHURINE  07/13/2020 1901    TEST NOT REPORTED DUE TO COLOR INTERFERENCE OF URINE PIGMENT   GLUCOSEU (A) 07/13/2020 1901    TEST NOT REPORTED DUE TO COLOR INTERFERENCE OF URINE PIGMENT   HGBUR (A) 07/13/2020 1901    TEST NOT REPORTED DUE TO COLOR INTERFERENCE OF URINE PIGMENT   BILIRUBINUR (A) 07/13/2020 1901    TEST NOT REPORTED DUE TO COLOR INTERFERENCE OF URINE PIGMENT   KETONESUR (A) 07/13/2020 1901    TEST NOT REPORTED DUE TO COLOR INTERFERENCE OF URINE PIGMENT   PROTEINUR (A) 07/13/2020 1901    TEST NOT REPORTED DUE TO COLOR INTERFERENCE OF URINE PIGMENT   UROBILINOGEN 2.0 (H) 12/13/2013 0738   NITRITE (A) 07/13/2020 1901     TEST NOT REPORTED DUE TO COLOR INTERFERENCE OF URINE PIGMENT   LEUKOCYTESUR (A) 07/13/2020 1901    TEST NOT REPORTED DUE TO COLOR INTERFERENCE OF URINE PIGMENT   Sepsis Labs: @LABRCNTIP (procalcitonin:4,lacticidven:4) ) Recent Results (from the past 240 hour(s))  SARS CORONAVIRUS 2 (TAT 6-24 HRS) Nasopharyngeal Nasopharyngeal Swab     Status: None   Collection Time: 08/20/20  9:36 PM   Specimen: Nasopharyngeal Swab  Result Value Ref Range Status   SARS Coronavirus 2 NEGATIVE NEGATIVE Final    Comment: (NOTE) SARS-CoV-2 target nucleic acids are NOT DETECTED.  The SARS-CoV-2 RNA is generally detectable in upper and lower respiratory specimens during the acute phase of infection. Negative results do not preclude SARS-CoV-2 infection, do not rule out co-infections with other pathogens, and should not be used as the sole basis for treatment or other patient management decisions. Negative results must be combined with clinical observations, patient history, and epidemiological information. The expected result is Negative.  Fact Sheet for Patients: SugarRoll.be  Fact Sheet for Healthcare Providers: https://www.woods-mathews.com/  This test is not yet approved or cleared by the [place] FDA and  has been authorized for detection and/or diagnosis of SARS-CoV-2 by FDA under an Emergency Use Authorization (EUA). This EUA will remain  in effect (meaning this test can be used) for the duration of the COVID-19 declaration under Se ction 564(b)(1) of the Act, 21 U.S.C. section 360bbb-3(b)(1), unless the authorization is terminated or revoked sooner.  Performed at [facility], [place] [address] [place], [place] [address]   Resp Panel by RT-PCR (Flu A&B, Covid) Nasopharyngeal Swab     Status: None   Collection Time: 08/23/20 12:06 PM   Specimen: Nasopharyngeal Swab; Nasopharyngeal(NP) swabs in vial transport  medium  Result Value Ref Range  Status   SARS Coronavirus 2 by RT PCR NEGATIVE NEGATIVE Final    Comment: (NOTE) SARS-CoV-2 target nucleic acids are NOT DETECTED.  The SARS-CoV-2 RNA is generally detectable in upper respiratory specimens during the acute phase of infection. The lowest concentration of SARS-CoV-2 viral copies this assay can detect is 138 copies/mL. A negative result does not preclude SARS-Cov-2 infection and should not be used as the sole basis for treatment or other patient management decisions. A negative result may occur with  improper specimen collection/handling, submission of specimen other than nasopharyngeal swab, presence of viral mutation(s) within the areas targeted by this assay, and inadequate number of viral copies(<138 copies/mL). A negative result must be combined with clinical observations, patient history, and epidemiological information. The expected result is Negative.  Fact Sheet for Patients:  EntrepreneurPulse.com.au  Fact Sheet for Healthcare Providers:  IncredibleEmployment.be  This test is no t yet approved or cleared by the [place] FDA and  has been authorized for detection and/or diagnosis of SARS-CoV-2 by FDA under an Emergency Use Authorization (EUA). This EUA will remain  in effect (meaning this test can be used) for the duration of the COVID-19 declaration under Section 564(b)(1) of the Act, 21 U.S.C.section 360bbb-3(b)(1), unless the authorization is terminated  or revoked sooner.       Influenza A by PCR NEGATIVE NEGATIVE Final   Influenza B by PCR NEGATIVE NEGATIVE Final    Comment: (NOTE) The Xpert Xpress SARS-CoV-2/FLU/RSV plus assay is intended as an aid in the diagnosis of influenza from Nasopharyngeal swab specimens and should not be used as a sole basis for treatment. Nasal washings and aspirates are unacceptable for Xpert Xpress SARS-CoV-2/FLU/RSV testing.  Fact Sheet for  Patients: EntrepreneurPulse.com.au  Fact Sheet for Healthcare Providers: IncredibleEmployment.be  This test is not yet approved or cleared by the [place] FDA and has been authorized for detection and/or diagnosis of SARS-CoV-2 by FDA under an Emergency Use Authorization (EUA). This EUA will remain in effect (meaning this test can be used) for the duration of the COVID-19 declaration under Section 564(b)(1) of the Act, 21 U.S.C. section 360bbb-3(b)(1), unless the authorization is terminated or revoked.  Performed at [facility], [place] [address] [place], [place] [address]          Radiology Studies: No results found.    Scheduled Meds:  ALPRAZolam  0.5 mg Oral TID   aspirin  162 mg Oral Daily   atorvastatin  40 mg Oral Daily   enoxaparin (LOVENOX) injection  40 mg Subcutaneous Q24H   feeding supplement  237 mL Oral BID BM   folic acid  1 mg Oral Daily   metoprolol succinate  25 mg Oral Daily   multivitamin with minerals  1 tablet Oral Daily   nystatin  5 mL Mouth/Throat QID   polyethylene glycol  17 g Oral Daily   thiamine  100 mg Oral Daily   Or   thiamine  100 mg Intravenous Daily   Continuous Infusions:  sodium chloride 20 mL/hr at 08/24/20 1138     LOS: 4 days      Debbe Odea, MD [facility] Pager: [email or web address] 08/24/2020, 3:22 PM

## 2020-08-24 NOTE — CV Procedure (Signed)
    Transesophageal Echocardiogram Note  Cody Douglas [id number] 01/15/1951  Procedure: Transesophageal Echocardiogram Indications: stroke   Procedure Details Consent: Obtained Time Out: Verified patient identification, verified procedure, site/side was marked, verified correct patient position, special equipment/implants available, Radiology Safety Procedures followed,  medications/allergies/relevent history reviewed, required imaging and test results available.  Performed  Medications:  During this procedure the patient is administered Lidocaine 60 mg IV followed by a propofol drip for sedation.Marland Kitchen by CRNA Fanny Skates.   The patient's heart rate, blood pressure, and oxygen saturation are monitored continuously during the procedure. The period of conscious sedation is 30  minutes, of which I was present face-to-face 100% of this time.  Left Ventrical:  normal LV function   Mitral Valve: mild - mod MR,  + leaflet thickening   Aortic Valve: there is a mobile mass on the [place] of the aortic valve . The mass is small - medium in size.   Appears larger and more mobile than previous TEE on Jul 12, 2020   Tricuspid Valve: no vegetation   Pulmonic Valve:  trace - mild PI   Left Atrium/ Left atrial appendage: no LAA thrombi  Atrial septum: no ASD or PFO by color or bubble study.    Aorta: moderate calcified plaque in aortic arch and descending aorta    Complications: No apparent complications Patient did tolerate procedure well.   Thayer Headings, Brooke Bonito., MD, [facility] 08/24/2020, 12:01 PM

## 2020-08-24 NOTE — Progress Notes (Signed)
PT Cancellation Note  Patient Details Name: Cody Douglas MRN: [id number] DOB: [birth year]   Cancelled Treatment:    Reason Eval/Treat Not Completed: Patient at procedure or test/unavailable (TEE).  Will check back as time allows.   Leighton Roach, PT  Acute Rehab Services  Pager [phone] Office [place] 08/24/2020, 12:15 PM

## 2020-08-24 NOTE — Progress Notes (Signed)
  Echocardiogram Echocardiogram Transesophageal has been performed.  Nuchem Grattan G Shaylynn Nulty 08/24/2020, 12:20 PM

## 2020-08-24 NOTE — Anesthesia Procedure Notes (Signed)
Procedure Name: MAC Date/Time: 08/24/2020 11:42 AM Performed by: Colin Benton, CRNA Pre-anesthesia Checklist: Patient identified, Emergency Drugs available, Suction available and Patient being monitored Patient Re-evaluated:Patient Re-evaluated prior to induction Oxygen Delivery Method: Nasal cannula Induction Type: IV induction Placement Confirmation: positive ETCO2 Dental Injury: Teeth and Oropharynx as per pre-operative assessment

## 2020-08-24 NOTE — Progress Notes (Addendum)
ANTICOAGULATION CONSULT NOTE - Initial Consult  Pharmacy Consult for IV Heparin Indication: stroke and AV thrombus  No Known Allergies  Patient Measurements: Height: 5\' 11"  (180.3 cm) Weight: 64.5 kg (142 lb 1.6 oz) IBW/kg (Calculated) : 75.3 Heparin Dosing Weight: 64.5 kg  Vital Signs: Temp: 98.3 F (36.8 C) (06/14 1542) Temp Source: Oral (06/14 1542) BP: 134/73 (06/14 1542) Pulse Rate: 86 (06/14 1542)  Labs: Recent Labs    08/22/20 0843 08/23/20 0337  HGB 11.5*  --   HCT 36.3*  --   PLT 318  --   CREATININE 0.45* 0.57*    Estimated Creatinine Clearance: 77.3 mL/min (A) (by C-G formula based on SCr of 0.57 mg/dL (L)).   Medical History: Past Medical History:  Diagnosis Date   Anxiety    Dyspnea    with exertion - has inhaler   Frequent headaches    GERD (gastroesophageal reflux disease)    Hypertension    Prostate cancer ([place])    Stroke [facility])    Patient reports having a stroke on 4/13    Medications:  Scheduled:   ALPRAZolam  0.5 mg Oral TID   atorvastatin  40 mg Oral Daily   feeding supplement  237 mL Oral BID BM   folic acid  1 mg Oral Daily   metoprolol succinate  25 mg Oral Daily   multivitamin with minerals  1 tablet Oral Daily   nystatin  5 mL Mouth/Throat QID   polyethylene glycol  17 g Oral Daily   thiamine  100 mg Oral Daily   Or   thiamine  100 mg Intravenous Daily   Infusions:   sodium chloride 20 mL/hr at 08/24/20 1138    Assessment: 70 years of age male admitted from [facility] with worsening left-sided weakness on 08/20/20 and found to have acute ischemic stroke. TEE today with mobile vegetation noted on the aortic valve. Pharmacy consulted to start IV Heparin with low goal of 0.3 to 0.5 and no bolus.   Baseline CBC low-stable (last on 6/12). Baseline INR 1. aPTT 30. Baseline SCr stable. No anticoagulation prior to admission.  Patient has been on Lovenox for DVT prophylaxis- last dose on 6/13 at 2100 PM. No signs or  symptoms of bleeding reported.   Goal of Therapy:  Heparin level 0.3 to 0.5 units/ml Monitor platelets by anticoagulation protocol: Yes   Plan:  Start IV Heparin at 900 units/hr.  No bolus.  Check Heparin level in 6-8 hours.  Daily Heparin level and CBC while on therapy.   Sloan Leiter, PharmD, BCPS, BCCCP Clinical Pharmacist Please refer to [facility] for [place] numbers 08/24/2020,4:34 PM

## 2020-08-24 NOTE — Interval H&P Note (Signed)
History and Physical Interval Note:  08/24/2020 11:30 AM  Cody Douglas  has presented today for surgery, with the diagnosis of stroke.  The various methods of treatment have been discussed with the patient and family. After consideration of risks, benefits and other options for treatment, the patient has consented to  Procedure(s): TRANSESOPHAGEAL ECHOCARDIOGRAM (TEE) (N/A) as a surgical intervention.  The patient's history has been reviewed, patient examined, no change in status, stable for surgery.  I have reviewed the patient's chart and labs.  Questions were answered to the patient's satisfaction.     Mertie Moores

## 2020-08-24 NOTE — Progress Notes (Signed)
Unable to complete noon assessment, pt off the unit for procedure.

## 2020-08-24 NOTE — Transfer of Care (Signed)
Immediate Anesthesia Transfer of Care Note  Patient: Cody Douglas  Procedure(s) Performed: TRANSESOPHAGEAL ECHOCARDIOGRAM (TEE) BUBBLE STUDY  Patient Location: Endoscopy Unit  Anesthesia Type:MAC  Level of Consciousness: drowsy  Airway & Oxygen Therapy: Patient Spontanous Breathing and Patient connected to nasal cannula oxygen  Post-op Assessment: Report given to RN and Post -op Vital signs reviewed and stable  Post vital signs: Reviewed and stable  Last Vitals:  Vitals Value Taken Time  BP 90/54 08/24/20 1206  Temp    Pulse 82 08/24/20 1206  Resp 42 08/24/20 1206  SpO2 97 % 08/24/20 1206  Vitals shown include unvalidated device data.  Last Pain:  Vitals:   08/24/20 1111  TempSrc:   PainSc: 8       Patients Stated Pain Goal: 1 (77/11/65 7903)  Complications: No notable events documented.

## 2020-08-25 ENCOUNTER — Ambulatory Visit
Admission: RE | Admit: 2020-08-25 | Discharge: 2020-08-25 | Disposition: A | Discharge status: Home / Self Care | Payer: Medicare Other | Source: Ambulatory Visit | Attending: Radiation Oncology | Admitting: Radiation Oncology

## 2020-08-25 ENCOUNTER — Encounter: Payer: Self-pay | Admitting: Nutrition

## 2020-08-25 ENCOUNTER — Inpatient Hospital Stay: Payer: Medicare Other | Admitting: Nutrition

## 2020-08-25 DIAGNOSIS — C3411 Malignant neoplasm of upper lobe, right bronchus or lung: Secondary | ICD-10-CM | POA: Diagnosis not present

## 2020-08-25 DIAGNOSIS — I351 Nonrheumatic aortic (valve) insufficiency: Secondary | ICD-10-CM

## 2020-08-25 DIAGNOSIS — I639 Cerebral infarction, unspecified: Secondary | ICD-10-CM | POA: Diagnosis not present

## 2020-08-25 DIAGNOSIS — I058 Other rheumatic mitral valve diseases: Secondary | ICD-10-CM | POA: Diagnosis not present

## 2020-08-25 DIAGNOSIS — I10 Essential (primary) hypertension: Secondary | ICD-10-CM | POA: Diagnosis not present

## 2020-08-25 DIAGNOSIS — I38 Endocarditis, valve unspecified: Secondary | ICD-10-CM | POA: Diagnosis not present

## 2020-08-25 DIAGNOSIS — I358 Other nonrheumatic aortic valve disorders: Secondary | ICD-10-CM | POA: Diagnosis not present

## 2020-08-25 LAB — BLOOD CULTURE ID PANEL (REFLEXED) - BCID2

## 2020-08-25 LAB — CBC
HCT: 33.2 % — ABNORMAL LOW (ref 39.0–52.0)
Hemoglobin: 10.8 g/dL — ABNORMAL LOW (ref 13.0–17.0)
MCH: 29.9 pg (ref 26.0–34.0)
MCHC: 32.5 g/dL (ref 30.0–36.0)
MCV: 92 fL (ref 80.0–100.0)
Platelets: 283 10*3/uL (ref 150–400)
RBC: 3.61 MIL/uL — ABNORMAL LOW (ref 4.22–5.81)
RDW: 14.9 % (ref 11.5–15.5)
WBC: 4.6 10*3/uL (ref 4.0–10.5)
nRBC: 0 % (ref 0.0–0.2)

## 2020-08-25 LAB — HEPARIN LEVEL (UNFRACTIONATED)
Heparin Unfractionated: 0.15 IU/mL — ABNORMAL LOW (ref 0.30–0.70)
Heparin Unfractionated: 0.19 IU/mL — ABNORMAL LOW (ref 0.30–0.70)
Heparin Unfractionated: 0.24 IU/mL — ABNORMAL LOW (ref 0.30–0.70)

## 2020-08-25 MED ORDER — MUSCLE RUB 10-15 % EX CREA
TOPICAL_CREAM | CUTANEOUS | Status: DC | PRN
  Administered 2020-08-25: 1 via TOPICAL
  Filled 2020-08-25: qty 85

## 2020-08-25 MED ORDER — LIDOCAINE 5 % EX PTCH
1.0000 | MEDICATED_PATCH | Freq: Every day | CUTANEOUS | Status: DC
  Administered 2020-08-25 – 2020-08-29 (×5): 1 via TRANSDERMAL
  Filled 2020-08-25 (×5): qty 1

## 2020-08-25 NOTE — Progress Notes (Signed)
ANTICOAGULATION CONSULT NOTE - Initial Consult  Pharmacy Consult for IV Heparin Indication: stroke and AV thrombus  Labs: Recent Labs    08/22/20 0843 08/23/20 0337 08/25/20 0324  HGB 11.5*  --  10.8*  HCT 36.3*  --  33.2*  PLT 318  --  283  HEPARINUNFRC  --   --  0.15*  CREATININE 0.45* 0.57*  --      Assessment: 70 years of age male admitted from [facility] home with worsening left-sided weakness on 08/20/20 and found to have acute ischemic stroke. TEE today with mobile vegetation noted on the aortic valve. Pharmacy consulted to start IV Heparin with low goal of 0.3 to 0.5 and no bolus.   Heparin level 0.15 units/ml  No issues noted with infusion.  No bleeding reported  Goal of Therapy:  Heparin level 0.3 to 0.5 units/ml Monitor platelets by anticoagulation protocol: Yes   Plan:  Increase IV Heparin to 1100 units/hr.  Check Heparin level in 6-8 hours.  Daily Heparin level and CBC while on therapy.   Excell Seltzer, PharmD Clinical Pharmacist  08/25/2020,4:00 AM

## 2020-08-25 NOTE — Progress Notes (Signed)
ANTICOAGULATION CONSULT NOTE - Follow Up Consult  Pharmacy Consult for heparin Indication:  AV thrombus and recurrent CVA  No Known Allergies  Patient Measurements: Height: 5\' 11"  (180.3 cm) Weight: 64.5 kg (142 lb 1.6 oz) IBW/kg (Calculated) : 75.3 Heparin Dosing Weight: 64.5 kg  Vital Signs: Temp: 98.4 F (36.9 C) (06/15 0808) Temp Source: Oral (06/15 0808) BP: 102/71 (06/15 0808) Pulse Rate: 86 (06/15 0808)  Labs: Recent Labs    08/23/20 0337 08/25/20 0324 08/25/20 1057  HGB  --  10.8*  --   HCT  --  33.2*  --   PLT  --  283  --   HEPARINUNFRC  --  0.15* 0.19*  CREATININE 0.57*  --   --     Estimated Creatinine Clearance: 77.3 mL/min (A) (by C-G formula based on SCr of 0.57 mg/dL (L)).   Medications:  Scheduled:   ALPRAZolam  0.5 mg Oral TID   atorvastatin  40 mg Oral Daily   feeding supplement  237 mL Oral BID BM   folic acid  1 mg Oral Daily   multivitamin with minerals  1 tablet Oral Daily   nystatin  5 mL Mouth/Throat QID   polyethylene glycol  17 g Oral Daily   thiamine  100 mg Oral Daily   Or   thiamine  100 mg Intravenous Daily   Infusions:   sodium chloride 20 mL/hr at 08/24/20 1138   heparin 1,100 Units/hr (08/25/20 0449)    Assessment:  70 yo M admitted with L arm weakness and L facial droop / dyarthria.  Recent CVA April 2022 with similar symptoms, now worse.  TEE showed mobile mass and pt was started on IV heparin.  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  Heparin level subtherapeutic on 1100 units/hr.  No bleeding noted.  Goal of Therapy:  Heparin level 0.3-0.5 units/ml Monitor platelets by anticoagulation protocol: Yes   Plan:  Increase heparin to 1300 units/hr Next heparin level in 6 hours. Daily heparin level and CBC while on heparin. Follow-up plans for long term anticoagulation.  [employer], Pharm.D., BCPS Clinical Pharmacist Clinical phone for 08/25/2020  from 7:30-3:00 is [phone].  **Pharmacist phone directory can be found on [email or web address] listed under [place].  08/25/2020 12:16 PM

## 2020-08-25 NOTE — Progress Notes (Signed)
  Speech Language Pathology Treatment: Cognitive-Linquistic  Patient Details Name: Cody Douglas MRN: [id number] DOB: 11/15/1950 Today's Date: 08/25/2020 Time: 1210-1222 SLP Time Calculation (min) (ACUTE ONLY): 12 min  Assessment / Plan / Recommendation Clinical Impression  F/u for cognition.  Speech clarity continues to improve.  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 He required mod verbal cues to focus attention to cell phone and find and select phone icon.  He required intermittent verbal cues to slow down/inhibit impulsivity in order to sequence steps to locate contact info.  Recognizes he will need to go back to SNF for rehab, but expressed sadness that he could not return home. Will benefit from ongoing SLP f/u for cognition here and at SNF.   HPI HPI: Cody Douglas is a 70 y.o. male with medical history significant of HTN, prostate CA, smoking and EtOH abuse. He has spent past 3 weeks in rehab. Pt presents to ED 08/20/20 with c/o L arm weakness and L facial droop / dyarthria.  These are similar to prior stroke symptoms but worse. MRI Head reveals Patchy small volume acute ischemic infarcts involving the right  basal ganglia, right periatrial white matter, and overlying right  frontal and occipital cortices as above. Probable mild associated petechial hemorrhage at the right occipital region. Additional chronic left frontal infarct, with a few additional  scattered remote bilateral cerebellar infarcts.      SLP Plan  Continue with current plan of care       Recommendations   SNF                Follow up Recommendations: Skilled Nursing facility SLP Visit Diagnosis: Cognitive communication deficit (J33.545) Plan: Continue with current plan of care       Caitlin Ainley L. Tivis Ringer, [place] Office number [phone] Pager [phone]                 Juan Quam Laurice 08/25/2020, 1:43 PM

## 2020-08-25 NOTE — Progress Notes (Signed)
Physical Therapy Treatment Patient Details Name: Cody Douglas MRN: [id number] DOB: Mar 26, 1950 Today's Date: 08/25/2020    History of Present Illness 70 y/o male presented to ED on 6/10 with L sided weakness, L facial droop, and dysarthria. MRI small acute ischemic infarcts involving R basal ganglia, R periartial white matter, and overlying R frontal and occipital cortices. PMH: HTN, prostate cancer, lung cancer, CVA 06/2020, GERD    PT Comments    Patient declined OOB mobility due to recently returning from radiation therapy but agreeable to bed level exercises. Patient performed AROM B LE exercises and AAROM L UE exercises. Patient with L UE strength 2-/5 noted this session compared to previous session. Continue to recommend SNF for ongoing Physical Therapy.      Follow Up Recommendations  SNF     Equipment Recommendations  Other (comment) (defer to post acute rehab)    Recommendations for Other Services       Precautions / Restrictions Precautions Precautions: Fall Precaution Comments: Reports hx of falls Restrictions Weight Bearing Restrictions: No    Mobility  Bed Mobility               General bed mobility comments: declined mobility this date    Transfers                 General transfer comment: declined mobility this date  Ambulation/Gait                 Stairs             Wheelchair Mobility    Modified Rankin (Stroke Patients Only) Modified Rankin (Stroke Patients Only) Pre-Morbid Rankin Score: Moderately severe disability Modified Rankin: Moderately severe disability     Balance                                            Cognition Arousal/Alertness: Awake/alert Behavior During Therapy: Flat affect Overall Cognitive Status: Impaired/Different from baseline Area of Impairment: Awareness;Safety/judgement;Problem solving;Attention;Memory                   Current Attention Level:  Selective Memory: Decreased short-term memory   Safety/Judgement: Decreased awareness of safety;Decreased awareness of deficits Awareness: Emergent Problem Solving: Slow processing;Requires verbal cues        Exercises General Exercises - Upper Extremity Shoulder Flexion: AAROM;Left;5 reps Elbow Flexion: AAROM;Left;5 reps Wrist Extension: AAROM;Left;5 reps General Exercises - Lower Extremity Ankle Circles/Pumps: AROM;Both;10 reps Heel Slides: AROM;Both;10 reps;Supine Hip ABduction/ADduction: AROM;Both;10 reps;Supine Straight Leg Raises: AROM;Both;10 reps;Supine    General Comments        Pertinent Vitals/Pain Pain Assessment: No/denies pain Faces Pain Scale: No hurt    Home Living                      Prior Function            PT Goals (current goals can now be found in the care plan section) Acute Rehab PT Goals Patient Stated Goal: go home PT Goal Formulation: With patient Time For Goal Achievement: 09/04/20 Potential to Achieve Goals: Fair Progress towards PT goals: Progressing toward goals    Frequency    Min 3X/week      PT Plan Current plan remains appropriate    Co-evaluation              AM-PAC PT "6 Clicks" Mobility  Outcome Measure  Help needed turning from your back to your side while in a flat bed without using bedrails?: A Little Help needed moving from lying on your back to sitting on the side of a flat bed without using bedrails?: A Little Help needed moving to and from a bed to a chair (including a wheelchair)?: A Little Help needed standing up from a chair using your arms (e.g., wheelchair or bedside chair)?: A Little Help needed to walk in hospital room?: A Lot Help needed climbing 3-5 steps with a railing? : Total 6 Click Score: 15    End of Session   Activity Tolerance: Patient tolerated treatment well Patient left: in bed;with call bell/phone within reach;with bed alarm set Nurse Communication: Mobility  status PT Visit Diagnosis: Unsteadiness on feet (R26.81);Muscle weakness (generalized) (M62.81);History of falling (Z91.81);Difficulty in walking, not elsewhere classified (R26.2);Other symptoms and signs involving the nervous system (R29.898);Hemiplegia and hemiparesis Hemiplegia - Right/Left: Left Hemiplegia - dominant/non-dominant: Non-dominant Hemiplegia - caused by: Cerebral infarction     Time: 8299-3716 PT Time Calculation (min) (ACUTE ONLY): 10 min  Charges:  $Therapeutic Exercise: 8-22 mins                     Jahmire Ruffins A. Gilford Rile PT, DPT Acute Rehabilitation Services Pager [phone] Office [phone]    Linna Hoff 08/25/2020, 4:03 PM

## 2020-08-25 NOTE — Progress Notes (Signed)
ANTICOAGULATION CONSULT NOTE - Follow Up Consult  Pharmacy Consult for IV Heparin Indication:  AV thrombus and recurrent CVA  No Known Allergies  Patient Measurements: Height: 5\' 11"  (180.3 cm) Weight: 64.5 kg (142 lb 1.6 oz) IBW/kg (Calculated) : 75.3 Heparin Dosing Weight: 64.5 kg  Vital Signs: Temp: 98.3 F (36.8 C) (06/15 1257) Temp Source: Oral (06/15 1257) BP: 108/68 (06/15 1257) Pulse Rate: 81 (06/15 1257)  Labs: Recent Labs    08/23/20 0337 08/25/20 0324 08/25/20 1057 08/25/20 2017  HGB  --  10.8*  --   --   HCT  --  33.2*  --   --   PLT  --  283  --   --   HEPARINUNFRC  --  0.15* 0.19* 0.24*  CREATININE 0.57*  --   --   --     Estimated Creatinine Clearance: 77.3 mL/min (A) (by C-G formula based on SCr of 0.57 mg/dL (L)).   Assessment: 70 yr old male was admitted from [place] home on 08/20/20 with worsening left-sided weakness  and found to have acute ischemic stroke. TEE revealed with mobile vegetation on the aortic valve. Pharmacy was consulted to start IV heparin with low goal of 0.3 to 0.5 units/ml and no bolus.  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  Heparin level ~8 hrs after heparin infusion was increased to 1300 units/hr was 0.24 units/ml, which is below the goal range for this pt. H/H 10.8/33.2, plt 283. Per RN, no issues with IV or bleeding observed.  Goal of Therapy:  Heparin level 0.3-0.5 units/ml Monitor platelets by anticoagulation protocol: Yes   Plan:  Increase heparin infusion to 1350 units/hr Check heparin level in 6 hrs Monitor daily heparin level, CBC while on heparin Follow-up plans for long-term anticoagulation  Gillermina Hu, PharmD, BCPS, [facility] Clinical Pharmacist 08/25/2020 9:43 PM

## 2020-08-25 NOTE — Progress Notes (Signed)
STROKE TEAM PROGRESS NOTE   INTERVAL HISTORY No acute event over night. Neuro unchanged. Pt continues to have daily radiotherapy for lung cancer at [facility]. On heparin IV, cardiology recommend long term lovenox vs. Coumadin vs. DOAC.   Vitals:   08/24/20 1917 08/24/20 2338 08/25/20 0808 08/25/20 1257  BP: 115/73 113/71 102/71 108/68  Pulse: 83 83 86 81  Resp: _0 Temp: 98.2 F (36.8 C) 98.2 F (36.8 C) 98.4 F (36.9 C) 98.3 F (36.8 C)  TempSrc: Oral Oral Oral Oral  SpO2: 95% 97% 92% 94%  Weight:      Height:       CBC:  Recent Labs  Lab 08/20/20 1901 08/20/20 2016 08/22/20 0843 08/25/20 0324  WBC 6.6  --  5.3 4.6  NEUTROABS 5.4  --   --   --   HGB 11.5*   < > 11.5* 10.8*  HCT 36.8*   < > 36.3* 33.2*  MCV 92.7  --  93.1 92.0  PLT 316  --  318 283   < > = values in this interval not displayed.   Basic Metabolic Panel:  Recent Labs  Lab 08/21/20 0141 08/22/20 0843 08/23/20 0337  NA  --  137 138  K  --  3.6 4.1  CL  --  104 105  CO2  --  26 28  GLUCOSE  --  108* 119*  BUN  --  13 13  CREATININE  --  0.45* 0.57*  CALCIUM  --  9.0 8.8*  MG 1.9  --  1.9  PHOS 3.0  --   --    Lipid Panel:  Recent Labs  Lab 08/21/20 0141  CHOL 141  TRIG 88  HDL 27*  CHOLHDL 5.2  VLDL 18  LDLCALC 96   HgbA1c:  Recent Labs  Lab 08/21/20 0141  HGBA1C 6.0*   Urine Drug Screen: No results for input(s): LABOPIA, COCAINSCRNUR, LABBENZ, AMPHETMU, THCU, LABBARB in the last 168 hours.    IMAGING past 24 hours  CT head Result read: 08/20/2020 Impression: There is no acute intracranial hemorrhage or evidence of acute infarction. ASPECT score is 10.   Chronic infarcts as described.  Foci of calcification along the right M1 and M2 MCA. Could reflect calcified emboli. Right M1 MCA irregularity on the prior MRA is likely related.   MRI HEAD: Result read: 08/21/2020 Impression: 1. Patchy small volume acute ischemic infarcts involving the right basal ganglia, right  periatrial white matter, and overlying right frontal and occipital cortices. Probable mild associated petechial hemorrhage at the right occipital region. No frank hemorrhagic transformation or mass effect. 2. Interval evolution of previously identified right basal ganglia infarct, now largely chronic in appearance. 3. Additional chronic left frontal infarct, with a few additional scattered remote bilateral cerebellar infarcts. 4. 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   MRA HEAD  Result read: 08/21/2020 Impression: 1. 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 2. No other proximal high-grade or correctable stenosis about the major intracranial arterial circulation. 3. Diffuse small vessel atheromatous irregularity.   MRA NECK: Result read: 08/21/2020 Impression: 1. Mild atheromatous irregularity about the carotid bifurcations/proximal ICAs bilaterally with estimated stenosis of up to 30-40% on the right. No significant stenosis about the left carotid artery system. 2. Widely patent vertebral arteries within the neck. Left vertebral artery dominant.  Echo Result read: 08/22/2020 Impression  1. Left ventricular ejection fraction, by estimation,  is 60 to 65%. The  left ventricle has normal function. The left ventricle has no regional  wall motion abnormalities. Left ventricular diastolic parameters are  consistent with Grade I diastolic  dysfunction (impaired relaxation).   2. Right ventricular systolic function is mildly reduced. The right  ventricular size is normal. Tricuspid regurgitation signal is inadequate  for assessing PA pressure.   3. There is a large density on the mitral valve annulus that is most  consistent with severe mitral annular calcification. . The mitral valve is  degenerative. No evidence of mitral valve regurgitation. No evidence of  mitral  stenosis. Severe mitral  annular calcification.   4. There is a large density on the AV that is likely eccentric calcific  deegeneration of the aortic valve but cannot rule out mass. In the setting  of CVA, recommend TEE to better define the aortic valve. . The aortic  valve is calcified. There is severe  calcifcation of the aortic valve. There is severe thickening of the aortic  valve. Aortic valve regurgitation is moderate. No aortic stenosis is  present. Aortic regurgitation PHT measures 359 msec. Aortic valve area, by  VTI measures 3.77 cm. Aortic valve   mean gradient measures 5.0 mmHg. Aortic valve Vmax measures 1.49 m/s.   5. Aortic dilatation noted. There is mild dilatation of the aortic root,  measuring 41 mm.   6. The inferior vena cava is normal in size with greater than 50%  respiratory variability, suggesting right atrial pressure of 3 mmHg.     PHYSICAL EXAM General: Laying comfortably in bed; in no acute distress. HENT: Normal oropharynx and mucosa. Normal external appearance of ears and nose. Neck: Supple, no pain or tenderness CV: No JVD. No peripheral edema. Pulmonary: Symmetric Chest rise. Normal respiratory effort. Abdomen: Soft to touch, non-tender. Ext: No cyanosis, edema, or deformity  Skin: No rash. Normal palpation of skin.    Mental status/Cognition: Alert, oriented to self, place, month and year, good attention. Speech/language: mildly dysarthric speech, fluent, comprehension intact, object naming intact, repetition intact. Cranial nerves:   CN II Pupils equal and reactive to light, no VF deficits   CN III,IV,VI EOM intact, no gaze preference or deviation, no nystagmus   CN V normal sensation in V1, V2, and V3 segments bilaterally   CN VII Mild L facial droop   CN VIII normal hearing to speech   CN IX & X normal palatal elevation, no uvular deviation   CN XI 5/5 head turn and 5/5 shoulder shrug bilaterally   CN XII midline tongue protrusion   Motor:  Upper right extremity 5/5, right lower extremity 5/5, Left upper extremity 3/5 proximal, wrist extension 2/5, hand grip 3/5. Left lower extremity 3/5 proximal, knee extension 4-/5 and ankle DF/PF 5-/5.  Tone and bulk:normal tone throughout; no atrophy noted Sensory: Pinprick and light touch intact throughout, bilaterally Cerebellar: finger-to-nose intact on right  Gait: deferred   ASSESSMENT/PLAN [name] is a 70 y.o. male with history of HTN, prostate cancer, stage 3 lung cancer on chemo and radiation, prior smoker, prior external capsule and caudate infarct who presents with left sided weakness, left facial droop, and dysarthria.  He did not receive tPA because he was outside the window.   Stroke:  right MCA scattered infarcts in the setting of severe distal right M1 stenosis. Code Stroke: no acute intracranial hemorrhage or evidence of acute infarction. ASPECT score is 10. MRI Patchy small volume acute ischemic infarcts involving the right  basal ganglia, right periatrial white matter, and overlying right frontal and occipital cortices MRA head/neck severe distal right M1 stenosis with diminished perfusion distally, stable from previous. 2D Echo EF 60-65%. No wall motion abnormality, large density on the MV annulus.  There is large density on the AV that is likely eccentric calcific degeneration of the aortic valve but cannot rule out mass.   TEE showed mobile mass on the [place] of the aortic valve . The mass is small - medium in size.   Appears larger and more mobile than previous TEE on Jul 12, 2020 LDL 96 HgbA1c 6.3 VTE prophylaxis - lovenox Diet: heart healthy aspirin 81 mg daily prior to admission, was on DAPT with ASA 340m and Plavix 722mbut he developed hemoptysis and hematuria, had been on ASA 81 then increased to aspirin 162 mg daily. Now on heparin IV per stroke protocol given MV mass. Further AC regimen per cardiology Therapy recommendations:  SNF Disposition:   pending  AV mass 5/2 TEE Abnormal aortic valve with small mass associated with right coronary  cusp (cannot R/O vegetation); the right cusp appears to be perforated; mild to moderate AI. 6/14 TEE mobile mass n the ventricular aspect of the AV. 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 Further AC regimen per cardiology.  Hx stroke:  07/02/20 he was found to have right BG infarct with high grade stenosis of right M1. Recommended ASA 325 and plavix 75 DAPT for 3 months given the intracranial stenosis. However, pt was found to have hemoptysis, DAPT was on hold. Placed on ASA 32559mnly for lower bleeding risk.  On 5/2 he developed hematuria, aspirin was then reduced to 1m86m Hypertension Home meds:  Toprol XL 25mg44mly stable Avoid low BP Long term BP goal 130-150 given high-grade right MCA stenosis  MV abnormality 07/01/2020 mitral Valve severe calcified bulky lesions on posterior and anterior leaflets favor vegetations rather than MAC 07/12/2020 The mitral valve is abnormal. Mild mitral valve regurgitation. Moderate mitral annular calcification. 6/12 TTE There is a large density on the mitral valve annulus that is most consistent with severe mitral annular calcification. 6/14 TEE mitral valve is grossly normal. Mild to moderate mitral annular calcification. Mild to moderate mitral valve regurgitation.  Echo pending  Hyperlipidemia Home meds:  none LDL 96, goal < 70 Atorvastatin 40mg 46my Continue statin at discharge  Diabetes type II controlled Home meds:  none HgbA1c 6.3, goal < 7.0 CBGs SSI  Tobacco abuse Current smoker Smoking cessation counseling provided Pt is  willing to quit   Alcohol abuse Heavy alcohol in the past, 1 bottle of vodka once a week Alcohol limitation education provided Recommend CIWA protocol Add B1/MVI/FA  Other Stroke Risk Factors Advanced Age >/= 65  Mi47aines  Other Active Problems GERD Prostate Cancer NSCLC stage 3: currently receiving chemo and radiation therapy Anxiety: xanax 0.5mg TI35mHospital day # 5  Neurology will sign off. Please call with questions. Pt will follow up with stroke clinic NP at [facility] as scheduled. Thanks for the consult.   Cody HawkingD Stroke Neurology 08/25/2020 1:25 PM   To contact Stroke Continuity provider, please refer to  [email or web address]. After hours, contact General Neurology  

## 2020-08-25 NOTE — Progress Notes (Signed)
Pt refuses CPAP 

## 2020-08-25 NOTE — Progress Notes (Signed)
[facility] PROGRESS NOTE    Progress Note  Cody Douglas  AYT:[id number] DOB: [birth year] DOA: 08/20/2020 PCP: Tamsen Roers, MD     Brief Narrative:   Cody Douglas is an 70 y.o. male past medical history of non-small cell cancer on chemo and radiation therapy, essential hypertension, also prostate cancer comes in from [facility] for left-sided weakness, he was recently discharged from the hospital 07/15/2020 for acute right-sided CVA with left-sided weakness transferred to skilled rehab.  Who comes back in for new CVA to the right basal ganglia parietal lobe matter right frontal and occipital lobes.  Assessment/Plan:   Acute ischemic stroke [facility]): MRI of the brain showed patchy multiple infarcts to the right basal ganglia, parietal white matter right frontal and occipital cortices. MRI of the head and neck showed M1 stenosis. 2D echo showed a large density in the a.m. the annulus and AV eccentric calcification but cannot rule out a mass, subsequently a TEE was recommended. TEE showed mobile mass on the aortic valve of medium size appears to be larger and more mobile than on the previous TEE on 07/12/2020. Neurology was consulted recommended aspirin and Plavix, but subsequently developed hemoptysis and hematuria so neurology recommended to discontinue antiplatelet therapy. Given that he had a mobile mass he was started on IV heparin and cardiology was consulted.  Aortic mass: Seen on TEE on 08/24/2020, there is a concern about him being sick endocarditis given history of stage III lung cancer.  But cannot rule out infective endocarditis has no fever or leukocytosis. Culture sent on 08/25/2018 has remained off antibiotics. Cardiology was consulted and he was started on IV heparin and aspirin has been discontinued. 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 This may will likely be the source of his recurrent stroke. Unfortunately the patient is not a surgical candidate therefore the only option will be to maximize medical therapy currently on IV heparin. Overall prognosis is poor as he has high risk of recurrence thromboembolic events. Will need to consult palliative care to address end-of-life  Cognitive assessment: Will need evaluation as an outpatient.  Stage III squamous cell lung cancer: Currently on chemo and radiation he was scheduled for treatment on 08/23/2020 which has been postponed.  Essential hypertension/grade 2 diastolic heart failure: He is currently on metoprolol will need to allow permissive hypertension.  Oral thrush: Likely related to chemotherapy started on nystatin.  Alcohol abuse: Treated with thiamine and folate.   DVT prophylaxis: heparin Family Communication:duaghter Status is: Inpatient  Remains inpatient appropriate because:Hemodynamically unstable  Dispo: The patient is from: SNF              Anticipated d/c is to: SNF              Patient currently is not medically stable to d/c.   Difficult to place patient No     Code Status:     Code Status Orders  (From admission, onward)           Start     Ordered   08/20/20 2123  Do not attempt resuscitation (DNR)  Continuous       Question Answer Comment  In the event of cardiac or respiratory ARREST Do not call a "code blue"   In the event of cardiac or respiratory ARREST Do not perform Intubation, CPR, defibrillation or ACLS   In the event of  cardiac or respiratory ARREST Use medication by any route, position, wound care, and other measures to relive pain and suffering. May use oxygen, suction and manual treatment of airway obstruction as needed for comfort.   Comments yellow form at bedside      08/20/20 2122           Code Status History     Date Active Date Inactive Code Status Order ID Comments User Context   08/20/2020  2039 08/20/2020 2122 Full Code [id number]  Etta Quill, DO ED   06/30/2020 2219 07/15/2020 1956 Full Code [id number]  Marcelyn Bruins, MD ED   12/13/2013 0223 12/14/2013 1627 Full Code [id number]  Toy Baker, MD Inpatient   12/13/2013 0113 12/13/2013 0223 Full Code [id number]  Toy Baker, MD Inpatient      Advance Directive Documentation    Flowsheet Row Most Recent Value  Type of Advance Directive Out of facility DNR (pink MOST or yellow form)  Pre-existing out of facility DNR order (yellow form or pink MOST form) Pink MOST/Yellow Form most recent copy in chart - Physician notified to receive inpatient order  "MOST" Form in Place? --         IV Access:   Peripheral IV   Procedures and diagnostic studies:   ECHO TEE  Result Date: 08/24/2020    TRANSESOPHOGEAL ECHO REPORT   Patient Name:   Cody Douglas Date of Exam: 08/24/2020 Medical Rec #:  [id number]      Height:       71.0 in Accession #:    [id number]     Weight:       142.1 lb Date of Birth:  22-Dec-1950      BSA:          1.823 m Patient Age:    35 years       BP:           116/69 mmHg Patient Gender: M              HR:           88 bpm. Exam Location:  Inpatient Procedure: Transesophageal Echo, Cardiac Doppler, Color Doppler and Saline            Contrast Bubble Study Indications:     Stroke I63.9  History:         Patient has prior history of Echocardiogram examinations, most                  recent 08/22/2020. Stroke, Signs/Symptoms:Dyspnea; Risk                  Factors:Hypertension. GERD. Cancer. ETOH abuse.  Sonographer:     Jonelle Sidle Dance Referring Phys:  [id number] Leanor Kail Diagnosing Phys: Mertie Moores MD PROCEDURE: The transesophogeal probe was passed without difficulty through the esophogus of the patient. Sedation performed by different physician. The patient was monitored while under deep sedation. Anesthestetic sedation was provided intravenously by Anesthesiology: 144.01mg  of Propofol, 60mg  of  Lidocaine. The patient developed no complications during the procedure. IMPRESSIONS  1. Left ventricular ejection fraction, by estimation, is 60 to 65%. The left ventricle has normal function.  2. Right ventricular systolic function is normal. The right ventricular size is normal.  3. No left atrial/left atrial appendage thrombus was detected.  4. The mitral valve is grossly normal. Mild to moderate mitral valve regurgitation.  5. 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 This mass appears to  be more mobile than on his last echo on Jul 12, 2020. . The aortic valve is abnormal. Aortic valve regurgitation is moderate to severe. FINDINGS  Left Ventricle: Left ventricular ejection fraction, by estimation, is 60 to 65%. The left ventricle has normal function. The left ventricular internal cavity size was normal in size. Right Ventricle: The right ventricular size is normal. Right vetricular wall thickness was not well visualized. Right ventricular systolic function is normal. Left Atrium: Left atrial size was normal in size. No left atrial/left atrial appendage thrombus was detected. Right Atrium: Right atrial size was normal in size. Pericardium: There is no evidence of pericardial effusion. Mitral Valve: The mitral valve is grossly normal. Mild to moderate mitral annular calcification. Mild to moderate mitral valve regurgitation. Tricuspid Valve: The tricuspid valve is grossly normal. Tricuspid valve regurgitation is mild. 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 This mass appears to be more mobile than on his last echo on Jul 12, 2020. The aortic valve is abnormal. Aortic valve regurgitation is moderate to severe. Pulmonic Valve: The pulmonic valve was grossly normal. Pulmonic valve regurgitation is mild. Aorta: The aortic root and ascending aorta are structurally normal, with no evidence of dilitation. IAS/Shunts: No atrial level shunt detected by color flow Doppler.  Agitated saline contrast was given intravenously to evaluate for intracardiac shunting. Mertie Moores MD Electronically signed by Mertie Moores MD Signature Date/Time: 08/24/2020/4:33:11 PM    Final      Medical Consultants:   None.   Subjective:    HARDIE VELTRE no new complains  Objective:    Vitals:   08/24/20 1322 08/24/20 1542 08/24/20 1917 08/24/20 2338  BP:  134/73 115/73 113/71  Pulse:  86 83 83  Resp: 20 20 19 19   Temp:  98.3 F (36.8 C) 98.2 F (36.8 C) 98.2 F (36.8 C)  TempSrc:  Oral Oral Oral  SpO2:  97% 95% 97%  Weight:      Height:       SpO2: 97 % O2 Flow Rate (L/min): 2 L/min   Intake/Output Summary (Last 24 hours) at 08/25/2020 0758 Last data filed at 08/25/2020 0450 Gross per 24 hour  Intake 234.13 ml  Output 950 ml  Net -715.87 ml   Filed Weights   08/20/20 1859  Weight: 64.5 kg    Exam: General exam: In no acute distress. Respiratory system: Good air movement and clear to auscultation. Cardiovascular system: S1 & S2 heard, RRR. No JVD. Gastrointestinal system: Abdomen is nondistended, soft and nontender.  Extremities: No pedal edema. Skin: No rashes, lesions or ulcers Psychiatry: Judgement and insight appear normal. Mood & affect appropriate.    Data Reviewed:    Labs: Basic Metabolic Panel: Recent Labs  Lab 08/20/20 1901 08/20/20 2016 08/21/20 0141 08/22/20 0843 08/23/20 0337  NA 138 140  --  137 138  K 3.8 3.6  --  3.6 4.1  CL 101 103  --  104 105  CO2 25  --   --  26 28  GLUCOSE 118* 109*  --  108* 119*  BUN 9 11  --  13 13  CREATININE 0.51* 0.40*  --  0.45* 0.57*  CALCIUM 9.1  --   --  9.0 8.8*  MG  --   --  1.9  --  1.9  PHOS  --   --  3.0  --   --    GFR Estimated Creatinine Clearance: 77.3 mL/min (A) (by  C-G formula based on SCr of 0.57 mg/dL (L)). Liver Function Tests: Recent Labs  Lab 08/20/20 1901 08/22/20 0843 08/23/20 0337  AST 18 16 14*  ALT 15 14 12   ALKPHOS 93 99 86  BILITOT 0.8 0.7 0.4  PROT  6.5 6.0* 5.7*  ALBUMIN 3.3* 3.0* 2.8*   No results for input(s): LIPASE, AMYLASE in the last 168 hours. No results for input(s): AMMONIA in the last 168 hours. Coagulation profile Recent Labs  Lab 08/20/20 1901  INR 1.0   COVID-19 Labs  No results for input(s): DDIMER, FERRITIN, LDH, CRP in the last 72 hours.  Lab Results  Component Value Date   SARSCOV2NAA NEGATIVE 08/23/2020   SARSCOV2NAA NEGATIVE 08/20/2020   [place] NEGATIVE 07/15/2020   [place] NEGATIVE 07/12/2020    CBC: Recent Labs  Lab 08/20/20 1901 08/20/20 2016 08/22/20 0843 08/25/20 0324  WBC 6.6  --  5.3 4.6  NEUTROABS 5.4  --   --   --   HGB 11.5* 13.9 11.5* 10.8*  HCT 36.8* 41.0 36.3* 33.2*  MCV 92.7  --  93.1 92.0  PLT 316  --  318 283   Cardiac Enzymes: No results for input(s): CKTOTAL, CKMB, CKMBINDEX, TROPONINI in the last 168 hours. BNP (last 3 results) No results for input(s): PROBNP in the last 8760 hours. CBG: Recent Labs  Lab 08/20/20 1902 08/23/20 2001  GLUCAP 106* 113*   D-Dimer: No results for input(s): DDIMER in the last 72 hours. Hgb A1c: No results for input(s): HGBA1C in the last 72 hours. Lipid Profile: No results for input(s): CHOL, HDL, LDLCALC, TRIG, CHOLHDL, LDLDIRECT in the last 72 hours. Thyroid function studies: No results for input(s): TSH, T4TOTAL, T3FREE, THYROIDAB in the last 72 hours.  Invalid input(s): FREET3 Anemia work up: No results for input(s): VITAMINB12, FOLATE, FERRITIN, TIBC, IRON, RETICCTPCT in the last 72 hours. Sepsis Labs: Recent Labs  Lab 08/20/20 1901 08/22/20 0843 08/25/20 0324  WBC 6.6 5.3 4.6   Microbiology Recent Results (from the past 240 hour(s))  SARS CORONAVIRUS 2 (TAT 6-24 HRS) Nasopharyngeal Nasopharyngeal Swab     Status: None   Collection Time: 08/20/20  9:36 PM   Specimen: Nasopharyngeal Swab  Result Value Ref Range Status   SARS Coronavirus 2 NEGATIVE NEGATIVE Final    Comment: (NOTE) SARS-CoV-2 target nucleic  acids are NOT DETECTED.  The SARS-CoV-2 RNA is generally detectable in upper and lower respiratory specimens during the acute phase of infection. Negative results do not preclude SARS-CoV-2 infection, do not rule out co-infections with other pathogens, and should not be used as the sole basis for treatment or other patient management decisions. Negative results must be combined with clinical observations, patient history, and epidemiological information. The expected result is Negative.  Fact Sheet for Patients: SugarRoll.be  Fact Sheet for Healthcare Providers: https://www.woods-mathews.com/  This test is not yet approved or cleared by the [place] FDA and  has been authorized for detection and/or diagnosis of SARS-CoV-2 by FDA under an Emergency Use Authorization (EUA). This EUA will remain  in effect (meaning this test can be used) for the duration of the COVID-19 declaration under Se ction 564(b)(1) of the Act, 21 U.S.C. section 360bbb-3(b)(1), unless the authorization is terminated or revoked sooner.  Performed at [facility], [place] [address], [place], [place] [address]   Resp Panel by RT-PCR (Flu A&B, Covid) Nasopharyngeal Swab     Status: None   Collection Time: 08/23/20 12:06 PM   Specimen: Nasopharyngeal Swab; Nasopharyngeal(NP)  swabs in vial transport medium  Result Value Ref Range Status   SARS Coronavirus 2 by RT PCR NEGATIVE NEGATIVE Final    Comment: (NOTE) SARS-CoV-2 target nucleic acids are NOT DETECTED.  The SARS-CoV-2 RNA is generally detectable in upper respiratory specimens during the acute phase of infection. The lowest concentration of SARS-CoV-2 viral copies this assay can detect is 138 copies/mL. A negative result does not preclude SARS-Cov-2 infection and should not be used as the sole basis for treatment or other patient management decisions. A negative result may occur with  improper specimen  collection/handling, submission of specimen other than nasopharyngeal swab, presence of viral mutation(s) within the areas targeted by this assay, and inadequate number of viral copies(<138 copies/mL). A negative result must be combined with clinical observations, patient history, and epidemiological information. The expected result is Negative.  Fact Sheet for Patients:  EntrepreneurPulse.com.au  Fact Sheet for Healthcare Providers:  IncredibleEmployment.be  This test is no t yet approved or cleared by the [place] FDA and  has been authorized for detection and/or diagnosis of SARS-CoV-2 by FDA under an Emergency Use Authorization (EUA). This EUA will remain  in effect (meaning this test can be used) for the duration of the COVID-19 declaration under Section 564(b)(1) of the Act, 21 U.S.C.section 360bbb-3(b)(1), unless the authorization is terminated  or revoked sooner.       Influenza A by PCR NEGATIVE NEGATIVE Final   Influenza B by PCR NEGATIVE NEGATIVE Final    Comment: (NOTE) The Xpert Xpress SARS-CoV-2/FLU/RSV plus assay is intended as an aid in the diagnosis of influenza from Nasopharyngeal swab specimens and should not be used as a sole basis for treatment. Nasal washings and aspirates are unacceptable for Xpert Xpress SARS-CoV-2/FLU/RSV testing.  Fact Sheet for Patients: EntrepreneurPulse.com.au  Fact Sheet for Healthcare Providers: IncredibleEmployment.be  This test is not yet approved or cleared by the [place] FDA and has been authorized for detection and/or diagnosis of SARS-CoV-2 by FDA under an Emergency Use Authorization (EUA). This EUA will remain in effect (meaning this test can be used) for the duration of the COVID-19 declaration under Section 564(b)(1) of the Act, 21 U.S.C. section 360bbb-3(b)(1), unless the authorization is terminated or revoked.  Performed at [facility], [place] [address], [place], [place] [address]   Culture, blood (routine x 2)     Status: None (Preliminary result)   Collection Time: 08/24/20  6:00 PM   Specimen: BLOOD  Result Value Ref Range Status   Specimen Description BLOOD RIGHT ANTECUBITAL  Final   Special Requests   Final    BOTTLES DRAWN AEROBIC AND ANAEROBIC Blood Culture adequate volume   Culture   Final    NO GROWTH < 12 HOURS Performed at [facility], [place] [address], [place], [place] [address]    Report Status PENDING  Incomplete  Culture, blood (routine x 2)     Status: None (Preliminary result)   Collection Time: 08/24/20  6:05 PM   Specimen: BLOOD  Result Value Ref Range Status   Specimen Description BLOOD BLOOD RIGHT FOREARM  Final   Special Requests   Final    BOTTLES DRAWN AEROBIC AND ANAEROBIC Blood Culture results may not be optimal due to an inadequate volume of blood received in culture bottles   Culture   Final    NO GROWTH < 12 HOURS Performed at [facility], [place] [address], [place], [place] [address]    Report Status PENDING  Incomplete     Medications:    ALPRAZolam  0.5 mg Oral TID   atorvastatin  40 mg Oral Daily   feeding supplement  237 mL Oral BID BM   folic acid  1 mg Oral Daily   metoprolol succinate  25 mg Oral Daily   multivitamin with minerals  1 tablet Oral Daily   nystatin  5 mL Mouth/Throat QID   polyethylene glycol  17 g Oral Daily   thiamine  100 mg Oral Daily   Or   thiamine  100 mg Intravenous Daily   Continuous Infusions:  sodium chloride 20 mL/hr at 08/24/20 1138   heparin 1,100 Units/hr (08/25/20 0449)      LOS: 5 days   Charlynne Cousins  [facility]  08/25/2020, 7:58 AM

## 2020-08-25 NOTE — Progress Notes (Signed)
OT Cancellation Note  Patient Details Name: Cody Douglas MRN: [id number] DOB: 10/10/1950   Cancelled Treatment:    Reason Eval/Treat Not Completed: Patient at procedure or test/ unavailable;Other (comment) pt out of room at [facility] for radiotherapy, will check back as time allows.  Corinne Ports K., COTA/L Acute Rehabilitation Services [phone]   [name] 08/25/2020, 3:12 PM

## 2020-08-25 NOTE — Progress Notes (Signed)
Patient is currently admitted to the hospital.  Nutrition appointment needs to be rescheduled as needed.

## 2020-08-26 ENCOUNTER — Encounter (HOSPITAL_COMMUNITY): Payer: Self-pay | Admitting: Cardiovascular Disease

## 2020-08-26 ENCOUNTER — Ambulatory Visit
Admission: RE | Admit: 2020-08-26 | Discharge: 2020-08-26 | Disposition: A | Discharge status: Home / Self Care | Payer: Medicare Other | Source: Ambulatory Visit | Attending: Radiation Oncology | Admitting: Radiation Oncology

## 2020-08-26 DIAGNOSIS — Z66 Do not resuscitate: Secondary | ICD-10-CM | POA: Diagnosis present

## 2020-08-26 DIAGNOSIS — I10 Essential (primary) hypertension: Secondary | ICD-10-CM | POA: Diagnosis not present

## 2020-08-26 DIAGNOSIS — C3411 Malignant neoplasm of upper lobe, right bronchus or lung: Secondary | ICD-10-CM | POA: Diagnosis not present

## 2020-08-26 DIAGNOSIS — I38 Endocarditis, valve unspecified: Secondary | ICD-10-CM | POA: Diagnosis not present

## 2020-08-26 DIAGNOSIS — Z7189 Other specified counseling: Secondary | ICD-10-CM | POA: Diagnosis not present

## 2020-08-26 DIAGNOSIS — I639 Cerebral infarction, unspecified: Secondary | ICD-10-CM | POA: Diagnosis not present

## 2020-08-26 DIAGNOSIS — I058 Other rheumatic mitral valve diseases: Secondary | ICD-10-CM | POA: Diagnosis not present

## 2020-08-26 LAB — CBC
HCT: 35.3 % — ABNORMAL LOW (ref 39.0–52.0)
Hemoglobin: 11.3 g/dL — ABNORMAL LOW (ref 13.0–17.0)
MCH: 29.4 pg (ref 26.0–34.0)
MCHC: 32 g/dL (ref 30.0–36.0)
MCV: 91.7 fL (ref 80.0–100.0)
Platelets: 246 10*3/uL (ref 150–400)
RBC: 3.85 MIL/uL — ABNORMAL LOW (ref 4.22–5.81)
RDW: 14.9 % (ref 11.5–15.5)
WBC: 4.4 10*3/uL (ref 4.0–10.5)
nRBC: 0 % (ref 0.0–0.2)

## 2020-08-26 LAB — HEPARIN LEVEL (UNFRACTIONATED): Heparin Unfractionated: 0.37 IU/mL (ref 0.30–0.70)

## 2020-08-26 MED ORDER — SODIUM CHLORIDE 0.9 % IV SOLN
2.0000 g | INTRAVENOUS | Status: DC
  Administered 2020-08-26 – 2020-08-28 (×3): 2 g via INTRAVENOUS
  Filled 2020-08-26 (×3): qty 20

## 2020-08-26 MED ORDER — VANCOMYCIN HCL 750 MG/150ML IV SOLN
750.0000 mg | Freq: Two times a day (BID) | INTRAVENOUS | Status: DC
  Administered 2020-08-26 – 2020-08-28 (×4): 750 mg via INTRAVENOUS
  Filled 2020-08-26 (×4): qty 150

## 2020-08-26 MED ORDER — VANCOMYCIN HCL 1000 MG/200ML IV SOLN
1000.0000 mg | Freq: Two times a day (BID) | INTRAVENOUS | Status: DC
  Administered 2020-08-26: 1000 mg via INTRAVENOUS
  Filled 2020-08-26 (×2): qty 200

## 2020-08-26 NOTE — Progress Notes (Signed)
PHARMACY - PHYSICIAN COMMUNICATION CRITICAL VALUE ALERT - BLOOD CULTURE IDENTIFICATION (BCID)  Cody Douglas is an 70 y.o. male who presented to [facility] on 08/20/2020 with a chief complaint of stroke  6/14 Blood cultures: 2/4 bottles with GPC in clusters, BCID with no detection  Assessment:  Aortic valve mass seen on recent TEE  Name of physician (or Provider) Contacted: Dr Myna Hidalgo  Current antibiotics: None  Changes to prescribed antibiotics recommended:  Start vancomycin 1000 mg IV q12h >>Estimated AUC: 539 Start Ceftriaxone 2g IV q24h Consider ID consult  Narda Bonds 08/26/2020  2:25 AM

## 2020-08-26 NOTE — Progress Notes (Signed)
ANTICOAGULATION CONSULT NOTE - Follow Up Consult  Pharmacy Consult for heparin Indication:  AV thrombus and recurrent CVA  No Known Allergies  Patient Measurements: Height: 5\' 11"  (180.3 cm) Weight: 64.5 kg (142 lb 1.6 oz) IBW/kg (Calculated) : 75.3 Heparin Dosing Weight: 64.5 kg  Vital Signs: Temp: 98.2 F (36.8 C) (06/16 0737) Temp Source: Oral (06/16 0737) BP: 129/69 (06/16 0737) Pulse Rate: 101 (06/16 0737)  Labs: Recent Labs    08/25/20 0324 08/25/20 1057 08/25/20 2017 08/26/20 0443  HGB 10.8*  --   --  11.3*  HCT 33.2*  --   --  35.3*  PLT 283  --   --  246  HEPARINUNFRC 0.15* 0.19* 0.24* 0.37     Estimated Creatinine Clearance: 77.3 mL/min (A) (by C-G formula based on SCr of 0.57 mg/dL (L)).   Medications:  Scheduled:   ALPRAZolam  0.5 mg Oral TID   atorvastatin  40 mg Oral Daily   feeding supplement  237 mL Oral BID BM   folic acid  1 mg Oral Daily   lidocaine  1 patch Transdermal QHS   multivitamin with minerals  1 tablet Oral Daily   nystatin  5 mL Mouth/Throat QID   polyethylene glycol  17 g Oral Daily   thiamine  100 mg Oral Daily   Or   thiamine  100 mg Intravenous Daily   Infusions:   sodium chloride 20 mL/hr at 08/24/20 1138   cefTRIAXone (ROCEPHIN)  IV 2 g (08/26/20 0304)   heparin 1,350 Units/hr (08/26/20 0855)   vancomycin 1,000 mg (08/26/20 0432)    Assessment:  70 yo M admitted with L arm weakness and L facial droop / dyarthria.  Recent CVA April 2022 with similar symptoms, now worse.  TEE showed mobile mass and pt was started on IV heparin.  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  Heparin level now therapeutic on 10 units/hr.  No bleeding noted.  Goal of Therapy:  Heparin level 0.3-0.5 units/ml Monitor platelets by anticoagulation protocol: Yes   Plan:  Continue heparin at 1350 units/hr Daily heparin level and CBC while on heparin. Follow-up plans for long term  anticoagulation.  [employer], Pharm.D., BCPS Clinical Pharmacist Clinical phone for 08/26/2020 from 7:30-3:00 is [phone].  **Pharmacist phone directory can be found on [email or web address] listed under [place].  08/26/2020 9:53 AM

## 2020-08-26 NOTE — Progress Notes (Signed)
Physical Therapy Treatment Patient Details Name: Cody Douglas MRN: [id number] DOB: 1950-04-19 Today's Date: 08/26/2020    History of Present Illness 69 y/o male presented to ED on 6/10 with L sided weakness, L facial droop, and dysarthria. MRI small acute ischemic infarcts involving R basal ganglia, R periartial white matter, and overlying R frontal and occipital cortices. PMH: HTN, prostate cancer, lung cancer, CVA 06/2020, GERD    PT Comments    Patient progressing towards physical therapy goals. Patient ambulating fwd/bwd/lateral steps with minA and RW. Patient with difficulty advancing R and L LE due to decreased weight shift and foot clearance bilaterally. Patient expressed frustration with progress. Provided encouragement with patient appreciative. Continue to recommend SNF for ongoing Physical Therapy.       Follow Up Recommendations  SNF     Equipment Recommendations  Other (comment) (defer to post acute rehab)    Recommendations for Other Services       Precautions / Restrictions Precautions Precautions: Fall Precaution Comments: Reports hx of falls Restrictions Weight Bearing Restrictions: No    Mobility  Bed Mobility Overal bed mobility: Needs Assistance Bed Mobility: Supine to Sit;Sit to Supine     Supine to sit: Mod assist Sit to supine: Min assist   General bed mobility comments: modA for moving towards EOB and trunk elevation, educated patient on log roll technique due to complaints of back pain. MinA to return LEs into bed    Transfers Overall transfer level: Needs assistance Equipment used: Rolling Biagio Snelson (2 wheeled) Transfers: Sit to/from Stand Sit to Stand: Min assist;From elevated surface         General transfer comment: minA to rise and steady with assist to guide L UE to RW  Ambulation/Gait Ambulation/Gait assistance: Min assist Gait Distance (Feet): 5 Feet Assistive device: Rolling Haizel Gatchell (2 wheeled) Gait Pattern/deviations: Step-to  pattern;Decreased step length - right;Decreased step length - left;Decreased stride length;Decreased weight shift to right;Decreased weight shift to left;Trunk flexed;Wide base of support Gait velocity: decreased   General Gait Details: Difficulty advancing L and R LE due o decreased weight shift. Minimal foot clearance bilaterally. MinA for balance. Fwd/bwd/lateral steps   Stairs             Wheelchair Mobility    Modified Rankin (Stroke Patients Only) Modified Rankin (Stroke Patients Only) Pre-Morbid Rankin Score: Moderately severe disability Modified Rankin: Moderately severe disability     Balance Overall balance assessment: Needs assistance Sitting-balance support: Bilateral upper extremity supported;Feet supported Sitting balance-Leahy Scale: Fair Sitting balance - Comments: able to sit EOB with supervision   Standing balance support: Bilateral upper extremity supported Standing balance-Leahy Scale: Poor Standing balance comment: relies on UE support and external assist                            Cognition Arousal/Alertness: Awake/alert Behavior During Therapy: Flat affect Overall Cognitive Status: Impaired/Different from baseline Area of Impairment: Awareness;Problem solving;Attention;Memory                   Current Attention Level: Selective Memory: Decreased short-term memory     Awareness: Emergent Problem Solving: Slow processing;Requires verbal cues General Comments: A&Ox 4. Demos slow processing. Improved safety awareness with patient asking "are you going to put the belt around me?" as PT was preparing room prior to mobilization.      Exercises      General Comments        Pertinent Vitals/Pain Pain  Assessment: Faces Faces Pain Scale: Hurts a little bit Pain Location: chronic back pain Pain Descriptors / Indicators: Aching;Grimacing;Discomfort Pain Intervention(s): Monitored during session;Repositioned    Home Living                       Prior Function            PT Goals (current goals can now be found in the care plan section) Acute Rehab PT Goals Patient Stated Goal: go home PT Goal Formulation: With patient Time For Goal Achievement: 09/04/20 Potential to Achieve Goals: Fair Progress towards PT goals: Progressing toward goals    Frequency    Min 3X/week      PT Plan Current plan remains appropriate    Co-evaluation              AM-PAC PT "6 Clicks" Mobility   Outcome Measure  Help needed turning from your back to your side while in a flat bed without using bedrails?: A Little Help needed moving from lying on your back to sitting on the side of a flat bed without using bedrails?: A Little Help needed moving to and from a bed to a chair (including a wheelchair)?: A Little Help needed standing up from a chair using your arms (e.g., wheelchair or bedside chair)?: A Little Help needed to walk in hospital room?: A Little Help needed climbing 3-5 steps with a railing? : Total 6 Click Score: 16    End of Session Equipment Utilized During Treatment: Gait belt Activity Tolerance: Patient tolerated treatment well Patient left: in bed;with call bell/phone within reach;with bed alarm set Nurse Communication: Mobility status PT Visit Diagnosis: Unsteadiness on feet (R26.81);Muscle weakness (generalized) (M62.81);History of falling (Z91.81);Difficulty in walking, not elsewhere classified (R26.2);Other symptoms and signs involving the nervous system (R29.898);Hemiplegia and hemiparesis Hemiplegia - Right/Left: Left Hemiplegia - dominant/non-dominant: Non-dominant Hemiplegia - caused by: Cerebral infarction     Time: 7622-6333 PT Time Calculation (min) (ACUTE ONLY): 28 min  Charges:  $Gait Training: 23-37 mins                     Vyncent Overby A. Gilford Rile PT, DPT Acute Rehabilitation Services Pager [phone] Office [phone]    Linna Hoff 08/26/2020, 1:49 PM

## 2020-08-26 NOTE — Consult Note (Signed)
Consultation Note Date: 08/26/2020   Patient Name: Cody Douglas  DOB: 1950/08/21  MRN: [id number]  Age / Sex: 70 y.o., male  PCP: Tamsen Roers, MD Referring Physician: Aileen Fass, Tammi Klippel, MD  Reason for Consultation: Establishing goals of care  HPI/Patient Profile: 70 y.o. male  with past medical history of  HTN, prostate CA, smoking, EtOH abuse, ischemic stroke in April 2022, and stg 3 NSCLC on chemo and radiation admitted on 08/20/2020 with  L arm weakness and L facial droop / dyarthria. Diagnosed with an acute ischemic stroke. 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 He is not a candidate for surgical intervention. PMT consulted to discuss [place].  Clinical Assessment and Goals of Care: I have reviewed medical records including EPIC notes, labs and imaging, received report from RN, assessed the patient and then met with patient  to discuss diagnosis prognosis, GOC, EOL wishes, disposition and options.  I introduced Palliative Medicine as specialized medical care for people living with serious illness. It focuses on providing relief from the symptoms and stress of a serious illness. The goal is to improve quality of life for both the patient and the family.  As far as functional and nutritional status, patient tells me of a functional decline and loss of appetite.    We discussed patient's current illness and what it means in the larger context of patient's on-going co-morbidities. Patient tells me at this point he is interested in continuing cancer treatment and returning to rehab however it is unclear if he understands severity of illness. When I ask him to describe what's going on with his body he tells me "it's not looking good".   I attempted to elicit values and goals of care important to the patient.  He tells me he would love to be home but he  understands that may not be a realistic goals as he does not have caregivers at home and his daughter lives out of state.   The difference between aggressive medical intervention and comfort care was considered in light of the patient's goals of care. I detailed what an aggressive path vs a comfort path for him.   I introduced him to a MOST form and asked him to consider the options in preparation for out meeting tomorrow.  I ask him about who he would want to make medical decisions for him if he were unable - he becomes tearful and tells me he does not want anyone else to make those decisions for him. We discussed that by documenting his wishes clearly now we can avoid most of that but d/t his high risk of stroke it is important to name an HCPOA. He expressed understanding and agreed to referral to spiritual care for AD completion.   Discussed with patient the importance of continued conversation with family and the medical providers regarding overall plan of care and treatment options, ensuring decisions are within the context of the patient's values and GOCs.    Spoke with patient's daughter - she plans to drive to [place] overnight from [place]. She will be here around lunch time tomorrow. She agrees meeting at 2 pm tomorrow. We briefly discuss his medical condition and current care. I also introduced purpose of meeting tomorrow.  Questions and concerns were addressed. The family was encouraged to call with questions or concerns.   Primary Decision Maker PATIENT though possibly some confusion, daughter to be present tomorrow  to join in decision making   SUMMARY OF RECOMMENDATIONS   - plan for goals of care meeting with patient, his daughter, and his brother 6/17 at 2 pm - MOST introduced - spiritual care consult for completion of AD  Code Status/Advance Care Planning: DNR  Discharge Planning: To Be Determined      Primary Diagnoses: Present on Admission:  Malignant neoplasm of  upper lobe of right lung ([place])  Acute ischemic stroke [facility])  Essential hypertension  Mitral valve mass  [place]   I have reviewed the medical record, interviewed the patient and family, and examined the patient. The following aspects are pertinent.  Past Medical History:  Diagnosis Date   Anxiety    Dyspnea    with exertion - has inhaler   Frequent headaches    GERD (gastroesophageal reflux disease)    Hypertension    Prostate cancer ([place])    Stroke [facility])    Patient reports having a stroke on 4/13   Social History   Socioeconomic History   Marital status: Widowed    Spouse name: Not on file   Number of children: Not on file   Years of education: Not on file   Highest education level: Not on file  Occupational History   Not on file  Tobacco Use   Smoking status: Every Day    Packs/day: 2.50    Years: 40.00    Pack years: 100.00    Types: Cigarettes   Smokeless tobacco: Never  Vaping Use   Vaping Use: Never used  Substance and Sexual Activity   Alcohol use: Yes    Alcohol/week: 1.0 standard drink    Types: 1 Cans of beer per week    Comment: couple of days a week   Drug use: No   Sexual activity: Not Currently  Other Topics Concern   Not on file  Social History Narrative   Not on file   Social Determinants of Health   Financial Resource Strain: Not on file  Food Insecurity: Not on file  Transportation Needs: Not on file  Physical Activity: Not on file  Stress: Not on file  Social Connections: Not on file   Family History  Problem Relation Age of Onset   Cancer - Lung Father    Heart disease Brother    Diabetes type II Neg Hx    Stroke Neg Hx    Scheduled Meds:  ALPRAZolam  0.5 mg Oral TID   atorvastatin  40 mg Oral Daily   feeding supplement  237 mL Oral BID BM   folic acid  1 mg Oral Daily   lidocaine  1 patch Transdermal QHS   multivitamin with minerals  1 tablet Oral Daily   nystatin  5 mL Mouth/Throat QID   polyethylene glycol  17 g Oral  Daily   thiamine  100 mg Oral Daily   Or   thiamine  100 mg Intravenous Daily   Continuous Infusions:  sodium chloride 20 mL/hr at 08/24/20 1138   cefTRIAXone (ROCEPHIN)  IV 2 g (08/26/20 0304)   heparin 1,350 Units/hr (08/26/20 0855)   vancomycin     PRN Meds:.acetaminophen **OR** acetaminophen (TYLENOL) oral liquid 160 mg/5 mL **OR** acetaminophen, bisacodyl, bisacodyl, Muscle Rub, oxyCODONE-acetaminophen No Known Allergies Review of Systems  Constitutional:  Positive for activity change, appetite change and fatigue.  Neurological:  Positive for weakness.   Physical Exam Constitutional:      General: He is not in acute distress. Pulmonary:  Effort: Pulmonary effort is normal.  Skin:    General: Skin is warm and dry.  Neurological:     Mental Status: He is alert and oriented to person, place, and time.    Vital Signs: BP 100/61 (BP Location: Left Arm)   Pulse 80   Temp 98.7 F (37.1 C) (Oral)   Resp 16   Ht _0  (1.803 m)   Wt 64.5 kg   SpO2 97%   BMI 19.82 kg/m  Pain Scale: 0-10   Pain Score: Asleep   SpO2: SpO2: 97 % O2 Device:SpO2: 97 % O2 Flow Rate: .O2 Flow Rate (L/min): 2 L/min  IO: Intake/output summary:  Intake/Output Summary (Last 24 hours) at 08/26/2020 1349 Last data filed at 08/26/2020 0700 Gross per 24 hour  Intake --  Output 1050 ml  Net -1050 ml    LBM: Last BM Date: 08/20/20 Baseline Weight: Weight: 64.5 kg Most recent weight: Weight: 64.5 kg     Palliative Assessment/Data: PPS 50%    Time Total: 65 minutes Greater than 50%  of this time was spent counseling and coordinating care related to the above assessment and plan.  Juel Burrow, DNP, AGNP-C Palliative Medicine Team [phone] Pager: [phone]

## 2020-08-26 NOTE — Plan of Care (Signed)
  Problem: Education: Goal: Knowledge of General Education information will improve Description: Including pain rating scale, medication(s)/side effects and non-pharmacologic comfort measures Outcome: Progressing   Problem: Health Behavior/Discharge Planning: Goal: Ability to manage health-related needs will improve Outcome: Progressing   Problem: Clinical Measurements: Goal: Ability to maintain clinical measurements within normal limits will improve Outcome: Progressing Goal: Will remain free from infection Outcome: Progressing Goal: Diagnostic test results will improve Outcome: Progressing Goal: Respiratory complications will improve Outcome: Progressing Goal: Cardiovascular complication will be avoided Outcome: Progressing   Problem: Nutrition: Goal: Adequate nutrition will be maintained Outcome: Progressing   Problem: Coping: Goal: Level of anxiety will decrease Outcome: Progressing   Problem: Elimination: Goal: Will not experience complications related to bowel motility Outcome: Progressing Goal: Will not experience complications related to urinary retention Outcome: Progressing   Problem: Pain Managment: Goal: General experience of comfort will improve Outcome: Progressing   Problem: Safety: Goal: Ability to remain free from injury will improve Outcome: Progressing   Problem: Skin Integrity: Goal: Risk for impaired skin integrity will decrease Outcome: Progressing   Problem: Education: Goal: Knowledge of disease or condition will improve Outcome: Progressing Goal: Knowledge of secondary prevention will improve Outcome: Progressing Goal: Knowledge of patient specific risk factors addressed and post discharge goals established will improve Outcome: Progressing Goal: Individualized Educational Video(s) Outcome: Progressing   Problem: Coping: Goal: Will verbalize positive feelings about self Outcome: Progressing   Problem: Health Behavior/Discharge  Planning: Goal: Ability to manage health-related needs will improve Outcome: Progressing   Problem: Self-Care: Goal: Ability to participate in self-care as condition permits will improve Outcome: Progressing Goal: Verbalization of feelings and concerns over difficulty with self-care will improve Outcome: Progressing Goal: Ability to communicate needs accurately will improve Outcome: Progressing   Problem: Nutrition: Goal: Risk of aspiration will decrease Outcome: Progressing   Problem: Ischemic Stroke/TIA Tissue Perfusion: Goal: Complications of ischemic stroke/TIA will be minimized Outcome: Progressing

## 2020-08-26 NOTE — Progress Notes (Signed)
Occupational Therapy Treatment Patient Details Name: Cody Douglas MRN: [id number] DOB: 1950-07-05 Today's Date: 08/26/2020    History of present illness 70 y/o male presented to ED on 6/10 with L sided weakness, L facial droop, and dysarthria. MRI small acute ischemic infarcts involving R basal ganglia, R periartial white matter, and overlying R frontal and occipital cortices. PMH: HTN, prostate cancer, lung cancer, CVA 06/2020, GERD   OT comments  Worked on facilitation of Reach with Lt UE. He was able to reach for and retrieve a cup at ~60* shoulder flexion and drink from it with min facilitation.  He demonstrates full active extension of digits, and 2/5 elbow flexion and extension this date.    Follow Up Recommendations  SNF;Supervision/Assistance - 24 hour    Equipment Recommendations  3 in 1 bedside commode    Recommendations for Other Services      Precautions / Restrictions Precautions Precautions: Fall Precaution Comments: Reports hx of falls Restrictions Weight Bearing Restrictions: No       Mobility Bed Mobility Overal bed mobility: Needs Assistance Bed Mobility: Supine to Sit;Sit to Supine     Supine to sit: Mod assist Sit to supine: Min assist   General bed mobility comments: modA for moving towards EOB and trunk elevation, educated patient on log roll technique due to complaints of back pain. MinA to return LEs into bed    Transfers Overall transfer level: Needs assistance Equipment used: Rolling walker (2 wheeled) Transfers: Sit to/from Stand Sit to Stand: Min assist;From elevated surface         General transfer comment: minA to rise and steady with assist to guide L UE to RW    Balance Overall balance assessment: Needs assistance Sitting-balance support: Bilateral upper extremity supported;Feet supported Sitting balance-Leahy Scale: Fair Sitting balance - Comments: able to sit EOB with supervision   Standing balance support: Bilateral upper  extremity supported Standing balance-Leahy Scale: Poor Standing balance comment: relies on UE support and external assist                           ADL either performed or assessed with clinical judgement   ADL                                               Vision       Perception     Praxis      Cognition Arousal/Alertness: Awake/alert Behavior During Therapy: Flat affect Overall Cognitive Status: Impaired/Different from baseline Area of Impairment: Attention;Following commands;Awareness;Problem solving                   Current Attention Level: Selective;Sustained Memory: Decreased short-term memory Following Commands: Follows one step commands consistently Safety/Judgement: Decreased awareness of safety;Decreased awareness of deficits Awareness: Emergent Problem Solving: Slow processing;Requires verbal cues General Comments: A&Ox 4. Demos slow processing. Improved safety awareness with patient asking "are you going to put the belt around me?" as PT was preparing room prior to mobilization.        Exercises Exercises: Other exercises Other Exercises Other Exercises: worked on facilitation of reach Lt UE.  Worked on hand to mouth and extending elbow and digits followed by reach to 60* shoulder flexion.  able to retrieve cup and drink from it with min facilitation.   Shoulder Instructions  General Comments      Pertinent Vitals/ Pain       Pain Assessment: No/denies pain Faces Pain Scale: Hurts a little bit Pain Location: chronic back pain Pain Descriptors / Indicators: Aching;Grimacing;Discomfort Pain Intervention(s): Monitored during session;Repositioned  Home Living                                          Prior Functioning/Environment              Frequency  Min 2X/week        Progress Toward Goals  OT Goals(current goals can now be found in the care plan section)  Progress towards  OT goals: Progressing toward goals  Acute Rehab OT Goals Patient Stated Goal: go home  Plan Discharge plan remains appropriate    Co-evaluation                 AM-PAC OT "6 Clicks" Daily Activity     Outcome Measure   Help from another person eating meals?: None Help from another person taking care of personal grooming?: A Little Help from another person toileting, which includes using toliet, bedpan, or urinal?: A Lot Help from another person bathing (including washing, rinsing, drying)?: A Lot Help from another person to put on and taking off regular upper body clothing?: A Little Help from another person to put on and taking off regular lower body clothing?: A Lot 6 Click Score: 16    End of Session    OT Visit Diagnosis: Other abnormalities of gait and mobility (R26.89);Unsteadiness on feet (R26.81);Muscle weakness (generalized) (M62.81);Pain;Hemiplegia and hemiparesis Hemiplegia - Right/Left: Left Hemiplegia - caused by: Cerebral infarction   Activity Tolerance Patient tolerated treatment well   Patient Left in bed;with call bell/phone within reach   Nurse Communication Mobility status        Time: 7322-0254 OT Time Calculation (min): 16 min  Charges: OT General Charges $OT Visit: 1 Visit OT Treatments $Neuromuscular Re-education: 8-22 mins  Nilsa Nutting OTR/L Acute Rehabilitation Services Pager [phone] Office [phone]    Lucille Passy M 08/26/2020, 3:52 PM

## 2020-08-26 NOTE — Progress Notes (Signed)
Monitor 88% on room air patient was asleep placed on 2L o2 states improved to 91% placed on 5 L and woke patient up to deep breath O2 stats 95-97% will continue to monitor. Arthor Captain LPN

## 2020-08-26 NOTE — Progress Notes (Signed)
Pharmacy Antibiotic Note  Cody Douglas is a 70 y.o. male admitted on 08/20/2020 with recurrent / worsening CVA symptoms.  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  Antibiotics started overnight with new BCx results of GPC clusters in 2 of 4 bottles.  BCID however did not detect specific pathogen.  Pharmacy has been consulted for Vancomycin dosing.  SCr  0.57 TBW 64.5kg Wt < IBW 75.3kg  Plan: Vancomycin 1gm IV loading dose given at 0430. Will adjust maintenance dose to 750mg  IV q12h Calculated AUC 471 (goal 400-550)  Height: 5\' 11"  (180.3 cm) Weight: 64.5 kg (142 lb 1.6 oz) IBW/kg (Calculated) : 75.3  Temp (24hrs), Avg:98.5 F (36.9 C), Min:98.2 F (36.8 C), Max:99 F (37.2 C)  Recent Labs  Lab 08/20/20 1901 08/20/20 2016 08/22/20 0843 08/23/20 0337 08/25/20 0324 08/26/20 0443  WBC 6.6  --  5.3  --  4.6 4.4  CREATININE 0.51* 0.40* 0.45* 0.57*  --   --     Estimated Creatinine Clearance: 77.3 mL/min (A) (by C-G formula based on SCr of 0.57 mg/dL (L)).    No Known Allergies  Antimicrobials this admission: Vanc 6/16 >>  Rocephin 6/16 >>   Dose adjustments this admission:   Microbiology results: 6/14 BCx x 2 > 2/4 bottles GPC clusters but BCID neg  Thank you for allowing pharmacy to be a part of this patient's care.  [employer], Pharm.D., BCPS Clinical Pharmacist Clinical phone for 08/26/2020 from 7:30-3:00 is [phone].  **Pharmacist phone directory can be found on [email or web address] listed under [email or web address].  08/26/2020 10:04 AM

## 2020-08-26 NOTE — Progress Notes (Signed)
[facility] PROGRESS NOTE    Progress Note  Cody GRANJA  Douglas:[id number] DOB: [birth year] DOA: 08/20/2020 PCP: Tamsen Roers, MD     Brief Narrative:   Cody Douglas is an 70 y.o. male past medical history of non-small cell cancer on chemo and radiation therapy, essential hypertension, also prostate cancer comes in from [facility] for left-sided weakness, he was recently discharged from the hospital 07/15/2020 for acute right-sided CVA with left-sided weakness transferred to skilled rehab.  Who comes back in for new CVA to the right basal ganglia parietal lobe matter right frontal and occipital lobes.  Assessment/Plan:   Acute ischemic stroke [facility]): MRI of the brain showed patchy multiple infarcts to the right basal ganglia, parietal white matter right frontal and occipital cortices. MRI of the head and neck showed M1 stenosis. 2D echo showed a large density in the a.m. the annulus and AV eccentric calcification but cannot rule out a mass, TEE was done that showed a mobile mass on the aortic area appears to be large concern about noninfective endocarditis. Continue IV heparin. Family to meet with palliative care.    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 Has remained afebrile with no leukocytosis off antibiotics, culture data sent on 08/24/2020 and has remained negative till date. Cardiology was consulted recommended IV heparin, aspirin was discontinued. There is likely the source of his recurrent stroke.  Unfortunately patient not a candidate for surgical intervention. Overall has a poor prognosis and high recurrence of embolic events. Palliative care to meet with family on 08/27/2020.  Cognitive assessment: Will need evaluation as an outpatient.  Stage III squamous cell lung cancer: Currently on chemo and radiation he was scheduled for  treatment on 08/23/2020 which has been postponed.  Essential hypertension/grade 2 diastolic heart failure: He is currently on metoprolol will need to allow permissive hypertension.  Oral thrush: Likely related to chemotherapy started on nystatin.  Alcohol abuse: Treated with thiamine and folate.   DVT prophylaxis: heparin Family Communication:duaghter Status is: Inpatient  Remains inpatient appropriate because:Hemodynamically unstable  Dispo: The patient is from: SNF              Anticipated d/c is to: SNF              Patient currently is not medically stable to d/c.   Difficult to place patient No     Code Status:     Code Status Orders  (From admission, onward)           Start     Ordered   08/20/20 2123  Do not attempt resuscitation (DNR)  Continuous       Question Answer Comment  In the event of cardiac or respiratory ARREST Do not call a "code blue"   In the event of cardiac or respiratory ARREST Do not perform Intubation, CPR, defibrillation or ACLS   In the event of cardiac or respiratory ARREST Use medication by any route, position, wound care, and other measures to relive pain and suffering. May use oxygen, suction and manual treatment of airway obstruction as needed for comfort.   Comments yellow form at bedside      08/20/20 2122           Code Status History     Date Active Date Inactive Code Status Order ID Comments User Context   08/20/2020 2039 08/20/2020 2122 Full Code  [id number]  Etta Quill, DO ED   06/30/2020 2219 07/15/2020 1956 Full Code [id number]  Marcelyn Bruins, MD ED   12/13/2013 0223 12/14/2013 1627 Full Code [id number]  Toy Baker, MD Inpatient   12/13/2013 0113 12/13/2013 0223 Full Code [id number]  Toy Baker, MD Inpatient      Advance Directive Documentation    Flowsheet Row Most Recent Value  Type of Advance Directive Out of facility DNR (pink MOST or yellow form)  Pre-existing out of facility DNR order  (yellow form or pink MOST form) Pink MOST/Yellow Form most recent copy in chart - Physician notified to receive inpatient order  "MOST" Form in Place? --         IV Access:   Peripheral IV   Procedures and diagnostic studies:   ECHO TEE  Result Date: 08/24/2020    TRANSESOPHOGEAL ECHO REPORT   Patient Name:   Cody Douglas Date of Exam: 08/24/2020 Medical Rec #:  [id number]      Height:       71.0 in Accession #:    [id number]     Weight:       142.1 lb Date of Birth:  Oct 30, 1950      BSA:          1.823 m Patient Age:    61 years       BP:           116/69 mmHg Patient Gender: M              HR:           88 bpm. Exam Location:  Inpatient Procedure: Transesophageal Echo, Cardiac Doppler, Color Doppler and Saline            Contrast Bubble Study Indications:     Stroke I63.9  History:         Patient has prior history of Echocardiogram examinations, most                  recent 08/22/2020. Stroke, Signs/Symptoms:Dyspnea; Risk                  Factors:Hypertension. GERD. Cancer. ETOH abuse.  Sonographer:     Jonelle Sidle Dance Referring Phys:  [id number] Leanor Kail Diagnosing Phys: Mertie Moores MD PROCEDURE: The transesophogeal probe was passed without difficulty through the esophogus of the patient. Sedation performed by different physician. The patient was monitored while under deep sedation. Anesthestetic sedation was provided intravenously by Anesthesiology: 144.01mg  of Propofol, 60mg  of Lidocaine. The patient developed no complications during the procedure. IMPRESSIONS  1. Left ventricular ejection fraction, by estimation, is 60 to 65%. The left ventricle has normal function.  2. Right ventricular systolic function is normal. The right ventricular size is normal.  3. No left atrial/left atrial appendage thrombus was detected.  4. The mitral valve is grossly normal. Mild to moderate mitral valve regurgitation.  5. 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 This mass appears to be  more mobile than on his last echo on Jul 12, 2020. . The aortic valve is abnormal. Aortic valve regurgitation is moderate to severe. FINDINGS  Left Ventricle: Left ventricular ejection fraction, by estimation, is 60 to 65%. The left ventricle has normal function. The left ventricular internal cavity size was normal in size. Right Ventricle: The right ventricular size is normal. Right vetricular wall thickness was not well visualized. Right ventricular systolic function is normal. Left Atrium: Left atrial size was normal  in size. No left atrial/left atrial appendage thrombus was detected. Right Atrium: Right atrial size was normal in size. Pericardium: There is no evidence of pericardial effusion. Mitral Valve: The mitral valve is grossly normal. Mild to moderate mitral annular calcification. Mild to moderate mitral valve regurgitation. Tricuspid Valve: The tricuspid valve is grossly normal. Tricuspid valve regurgitation is mild. 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 This mass appears to be more mobile than on his last echo on Jul 12, 2020. The aortic valve is abnormal. Aortic valve regurgitation is moderate to severe. Pulmonic Valve: The pulmonic valve was grossly normal. Pulmonic valve regurgitation is mild. Aorta: The aortic root and ascending aorta are structurally normal, with no evidence of dilitation. IAS/Shunts: No atrial level shunt detected by color flow Doppler. Agitated saline contrast was given intravenously to evaluate for intracardiac shunting. Mertie Moores MD Electronically signed by Mertie Moores MD Signature Date/Time: 08/24/2020/4:33:11 PM    Final      Medical Consultants:   None.   Subjective:    SANEL STEMMER did not like his mood this morning.  Objective:    Vitals:   08/25/20 1257 08/25/20 2318 08/26/20 0354 08/26/20 0737  BP: 108/68 102/60 122/85 129/69  Pulse: 81 85 88 (!) 101  Resp: 20 19 19 20   Temp: 98.3 F (36.8 C) 99 F (37.2 C)  98.4 F (36.9 C) 98.2 F (36.8 C)  TempSrc: Oral Oral Oral Oral  SpO2: 94% 94% 94% 94%  Weight:      Height:       SpO2: 94 % O2 Flow Rate (L/min): 2 L/min   Intake/Output Summary (Last 24 hours) at 08/26/2020 0815 Last data filed at 08/26/2020 0700 Gross per 24 hour  Intake --  Output 1050 ml  Net -1050 ml    Filed Weights   08/20/20 1859  Weight: 64.5 kg    Exam: General exam: In no acute distress. Respiratory system: Good air movement and clear to auscultation. Cardiovascular system: S1 & S2 heard, RRR. No JVD. Gastrointestinal system: Abdomen is nondistended, soft and nontender.  Extremities: No pedal edema. Skin: No rashes, lesions or ulcers Psychiatry: Judgement and insight appear normal. Mood & affect appropriate.   Data Reviewed:    Labs: Basic Metabolic Panel: Recent Labs  Lab 08/20/20 1901 08/20/20 2016 08/21/20 0141 08/22/20 0843 08/23/20 0337  NA 138 140  --  137 138  K 3.8 3.6  --  3.6 4.1  CL 101 103  --  104 105  CO2 25  --   --  26 28  GLUCOSE 118* 109*  --  108* 119*  BUN 9 11  --  13 13  CREATININE 0.51* 0.40*  --  0.45* 0.57*  CALCIUM 9.1  --   --  9.0 8.8*  MG  --   --  1.9  --  1.9  PHOS  --   --  3.0  --   --     GFR Estimated Creatinine Clearance: 77.3 mL/min (A) (by C-G formula based on SCr of 0.57 mg/dL (L)). Liver Function Tests: Recent Labs  Lab 08/20/20 1901 08/22/20 0843 08/23/20 0337  AST 18 16 14*  ALT 15 14 12   ALKPHOS 93 99 86  BILITOT 0.8 0.7 0.4  PROT 6.5 6.0* 5.7*  ALBUMIN 3.3* 3.0* 2.8*    No results for input(s): LIPASE, AMYLASE in the last 168 hours. No results for input(s): AMMONIA in the last 168 hours.  Coagulation profile Recent Labs  Lab 08/20/20 1901  INR 1.0    COVID-19 Labs  No results for input(s): DDIMER, FERRITIN, LDH, CRP in the last 72 hours.  Lab Results  Component Value Date   SARSCOV2NAA NEGATIVE 08/23/2020   SARSCOV2NAA NEGATIVE 08/20/2020   [place] NEGATIVE 07/15/2020    [place] NEGATIVE 07/12/2020    CBC: Recent Labs  Lab 08/20/20 1901 08/20/20 2016 08/22/20 0843 08/25/20 0324 08/26/20 0443  WBC 6.6  --  5.3 4.6 4.4  NEUTROABS 5.4  --   --   --   --   HGB 11.5* 13.9 11.5* 10.8* 11.3*  HCT 36.8* 41.0 36.3* 33.2* 35.3*  MCV 92.7  --  93.1 92.0 91.7  PLT 316  --  318 283 246    Cardiac Enzymes: No results for input(s): CKTOTAL, CKMB, CKMBINDEX, TROPONINI in the last 168 hours. BNP (last 3 results) No results for input(s): PROBNP in the last 8760 hours. CBG: Recent Labs  Lab 08/20/20 1902 08/23/20 2001  GLUCAP 106* 113*    D-Dimer: No results for input(s): DDIMER in the last 72 hours. Hgb A1c: No results for input(s): HGBA1C in the last 72 hours. Lipid Profile: No results for input(s): CHOL, HDL, LDLCALC, TRIG, CHOLHDL, LDLDIRECT in the last 72 hours. Thyroid function studies: No results for input(s): TSH, T4TOTAL, T3FREE, THYROIDAB in the last 72 hours.  Invalid input(s): FREET3 Anemia work up: No results for input(s): VITAMINB12, FOLATE, FERRITIN, TIBC, IRON, RETICCTPCT in the last 72 hours. Sepsis Labs: Recent Labs  Lab 08/20/20 1901 08/22/20 0843 08/25/20 0324 08/26/20 0443  WBC 6.6 5.3 4.6 4.4    Microbiology Recent Results (from the past 240 hour(s))  SARS CORONAVIRUS 2 (TAT 6-24 HRS) Nasopharyngeal Nasopharyngeal Swab     Status: None   Collection Time: 08/20/20  9:36 PM   Specimen: Nasopharyngeal Swab  Result Value Ref Range Status   SARS Coronavirus 2 NEGATIVE NEGATIVE Final    Comment: (NOTE) SARS-CoV-2 target nucleic acids are NOT DETECTED.  The SARS-CoV-2 RNA is generally detectable in upper and lower respiratory specimens during the acute phase of infection. Negative results do not preclude SARS-CoV-2 infection, do not rule out co-infections with other pathogens, and should not be used as the sole basis for treatment or other patient management decisions. Negative results must be combined with  clinical observations, patient history, and epidemiological information. The expected result is Negative.  Fact Sheet for Patients: SugarRoll.be  Fact Sheet for Healthcare Providers: https://www.woods-mathews.com/  This test is not yet approved or cleared by the [place] FDA and  has been authorized for detection and/or diagnosis of SARS-CoV-2 by FDA under an Emergency Use Authorization (EUA). This EUA will remain  in effect (meaning this test can be used) for the duration of the COVID-19 declaration under Se ction 564(b)(1) of the Act, 21 U.S.C. section 360bbb-3(b)(1), unless the authorization is terminated or revoked sooner.  Performed at [facility], [place] [address], [place], [place] [address]   Resp Panel by RT-PCR (Flu A&B, Covid) Nasopharyngeal Swab     Status: None   Collection Time: 08/23/20 12:06 PM   Specimen: Nasopharyngeal Swab; Nasopharyngeal(NP) swabs in vial transport medium  Result Value Ref Range Status   SARS Coronavirus 2 by RT PCR NEGATIVE NEGATIVE Final    Comment: (NOTE) SARS-CoV-2 target nucleic acids are NOT DETECTED.  The SARS-CoV-2 RNA is generally detectable in upper respiratory specimens during the acute phase of infection. The lowest concentration of SARS-CoV-2 viral copies  this assay can detect is 138 copies/mL. A negative result does not preclude SARS-Cov-2 infection and should not be used as the sole basis for treatment or other patient management decisions. A negative result may occur with  improper specimen collection/handling, submission of specimen other than nasopharyngeal swab, presence of viral mutation(s) within the areas targeted by this assay, and inadequate number of viral copies(<138 copies/mL). A negative result must be combined with clinical observations, patient history, and epidemiological information. The expected result is Negative.  Fact Sheet for Patients:   EntrepreneurPulse.com.au  Fact Sheet for Healthcare Providers:  IncredibleEmployment.be  This test is no t yet approved or cleared by the [place] FDA and  has been authorized for detection and/or diagnosis of SARS-CoV-2 by FDA under an Emergency Use Authorization (EUA). This EUA will remain  in effect (meaning this test can be used) for the duration of the COVID-19 declaration under Section 564(b)(1) of the Act, 21 U.S.C.section 360bbb-3(b)(1), unless the authorization is terminated  or revoked sooner.       Influenza A by PCR NEGATIVE NEGATIVE Final   Influenza B by PCR NEGATIVE NEGATIVE Final    Comment: (NOTE) The Xpert Xpress SARS-CoV-2/FLU/RSV plus assay is intended as an aid in the diagnosis of influenza from Nasopharyngeal swab specimens and should not be used as a sole basis for treatment. Nasal washings and aspirates are unacceptable for Xpert Xpress SARS-CoV-2/FLU/RSV testing.  Fact Sheet for Patients: EntrepreneurPulse.com.au  Fact Sheet for Healthcare Providers: IncredibleEmployment.be  This test is not yet approved or cleared by the [place] FDA and has been authorized for detection and/or diagnosis of SARS-CoV-2 by FDA under an Emergency Use Authorization (EUA). This EUA will remain in effect (meaning this test can be used) for the duration of the COVID-19 declaration under Section 564(b)(1) of the Act, 21 U.S.C. section 360bbb-3(b)(1), unless the authorization is terminated or revoked.  Performed at [facility], [place] [address], [place], [place] [address]   Culture, blood (routine x 2)     Status: None (Preliminary result)   Collection Time: 08/24/20  6:00 PM   Specimen: BLOOD  Result Value Ref Range Status   Specimen Description BLOOD RIGHT ANTECUBITAL  Final   Special Requests   Final    BOTTLES DRAWN AEROBIC AND ANAEROBIC Blood Culture adequate volume   Culture    Final    NO GROWTH 2 DAYS Performed at [facility], [place] [address] [place], [place] [address]    Report Status PENDING  Incomplete  Culture, blood (routine x 2)     Status: None (Preliminary result)   Collection Time: 08/24/20  6:05 PM   Specimen: BLOOD  Result Value Ref Range Status   Specimen Description BLOOD BLOOD RIGHT FOREARM  Final   Special Requests   Final    BOTTLES DRAWN AEROBIC AND ANAEROBIC Blood Culture results may not be optimal due to an inadequate volume of blood received in culture bottles   Culture  Setup Time   Final    GRAM POSITIVE COCCI IN CLUSTERS IN BOTH AEROBIC AND ANAEROBIC BOTTLES Organism ID to follow CRITICAL RESULT CALLED TO, READ BACK BY AND VERIFIED WITHKarsten Ro PHARMD, AT 1154 08/25/20 [place] Performed at [facility], [place] [address], [place], [place] [address]    Culture [facility] POSITIVE COCCI  Final   Report Status PENDING  Incomplete  Blood Culture ID Panel (Reflexed)     Status: None   Collection Time: 08/24/20  6:05 PM  Result Value Ref Range Status   Enterococcus faecalis NOT DETECTED NOT DETECTED Final   Enterococcus Faecium NOT DETECTED NOT DETECTED Final   Listeria monocytogenes NOT DETECTED NOT DETECTED Final   Staphylococcus species NOT DETECTED NOT DETECTED Final   Staphylococcus aureus (BCID) NOT DETECTED NOT DETECTED Final   Staphylococcus epidermidis NOT DETECTED NOT DETECTED Final   Staphylococcus lugdunensis NOT DETECTED NOT DETECTED Final   Streptococcus species NOT DETECTED NOT DETECTED Final   Streptococcus agalactiae NOT DETECTED NOT DETECTED Final   Streptococcus pneumoniae NOT DETECTED NOT DETECTED Final   Streptococcus pyogenes NOT DETECTED NOT DETECTED Final   A.calcoaceticus-baumannii NOT DETECTED NOT DETECTED Final   Bacteroides fragilis NOT DETECTED NOT DETECTED Final   Enterobacterales NOT DETECTED NOT DETECTED Final   Enterobacter cloacae complex NOT DETECTED NOT DETECTED Final   Escherichia coli  NOT DETECTED NOT DETECTED Final   Klebsiella aerogenes NOT DETECTED NOT DETECTED Final   Klebsiella oxytoca NOT DETECTED NOT DETECTED Final   Klebsiella pneumoniae NOT DETECTED NOT DETECTED Final   Proteus species NOT DETECTED NOT DETECTED Final   Salmonella species NOT DETECTED NOT DETECTED Final   Serratia marcescens NOT DETECTED NOT DETECTED Final   Haemophilus influenzae NOT DETECTED NOT DETECTED Final   Neisseria meningitidis NOT DETECTED NOT DETECTED Final   Pseudomonas aeruginosa NOT DETECTED NOT DETECTED Final   Stenotrophomonas maltophilia NOT DETECTED NOT DETECTED Final   Candida albicans NOT DETECTED NOT DETECTED Final   Candida auris NOT DETECTED NOT DETECTED Final   Candida glabrata NOT DETECTED NOT DETECTED Final   Candida krusei NOT DETECTED NOT DETECTED Final   Candida parapsilosis NOT DETECTED NOT DETECTED Final   Candida tropicalis NOT DETECTED NOT DETECTED Final   Cryptococcus neoformans/gattii NOT DETECTED NOT DETECTED Final    Comment: Performed at [facility], [address], [place], [place] [address]     Medications:    ALPRAZolam  0.5 mg Oral TID   atorvastatin  40 mg Oral Daily   feeding supplement  237 mL Oral BID BM   folic acid  1 mg Oral Daily   lidocaine  1 patch Transdermal QHS   multivitamin with minerals  1 tablet Oral Daily   nystatin  5 mL Mouth/Throat QID   polyethylene glycol  17 g Oral Daily   thiamine  100 mg Oral Daily   Or   thiamine  100 mg Intravenous Daily   Continuous Infusions:  sodium chloride 20 mL/hr at 08/24/20 1138   cefTRIAXone (ROCEPHIN)  IV 2 g (08/26/20 0304)   heparin 1,350 Units/hr (08/25/20 2159)   vancomycin 1,000 mg (08/26/20 0432)      LOS: 6 days   Charlynne Cousins  [facility]  08/26/2020, 8:15 AM

## 2020-08-26 NOTE — Progress Notes (Signed)
O2 stats 100% on 5 liters decreased to 2 liters will continue to monitor and report ot oncoming RN

## 2020-08-26 NOTE — Progress Notes (Signed)
Patient of floor for procedure

## 2020-08-27 ENCOUNTER — Ambulatory Visit
Admission: RE | Admit: 2020-08-27 | Discharge: 2020-08-27 | Disposition: A | Discharge status: Home / Self Care | Payer: Medicare Other | Source: Ambulatory Visit | Attending: Radiation Oncology | Admitting: Radiation Oncology

## 2020-08-27 DIAGNOSIS — I10 Essential (primary) hypertension: Secondary | ICD-10-CM | POA: Diagnosis not present

## 2020-08-27 DIAGNOSIS — C3411 Malignant neoplasm of upper lobe, right bronchus or lung: Secondary | ICD-10-CM | POA: Diagnosis not present

## 2020-08-27 DIAGNOSIS — I639 Cerebral infarction, unspecified: Secondary | ICD-10-CM | POA: Diagnosis not present

## 2020-08-27 DIAGNOSIS — I058 Other rheumatic mitral valve diseases: Secondary | ICD-10-CM | POA: Diagnosis not present

## 2020-08-27 DIAGNOSIS — Z66 Do not resuscitate: Secondary | ICD-10-CM | POA: Diagnosis not present

## 2020-08-27 LAB — CBC
HCT: 34 % — ABNORMAL LOW (ref 39.0–52.0)
Hemoglobin: 10.7 g/dL — ABNORMAL LOW (ref 13.0–17.0)
MCH: 28.9 pg (ref 26.0–34.0)
MCHC: 31.5 g/dL (ref 30.0–36.0)
MCV: 91.9 fL (ref 80.0–100.0)
Platelets: 240 10*3/uL (ref 150–400)
RBC: 3.7 MIL/uL — ABNORMAL LOW (ref 4.22–5.81)
RDW: 14.9 % (ref 11.5–15.5)
WBC: 5 10*3/uL (ref 4.0–10.5)
nRBC: 0 % (ref 0.0–0.2)

## 2020-08-27 LAB — CULTURE, BLOOD (ROUTINE X 2)

## 2020-08-27 LAB — HEPARIN LEVEL (UNFRACTIONATED): Heparin Unfractionated: 0.26 IU/mL — ABNORMAL LOW (ref 0.30–0.70)

## 2020-08-27 MED FILL — Dexamethasone Sodium Phosphate Inj 100 MG/10ML: INTRAMUSCULAR | Qty: 2 | Status: AC

## 2020-08-27 NOTE — Progress Notes (Signed)
This chaplain responded to PMT consult for creating or updating an Advance Directive.  The Pt. is cautiously, curious as I enter the room and introduce myself; often indicating to me he doesn't understand. The chaplain notices the Pt. move the table closer to him. After the introduction, the Pt. accepts my invitation to sit down at the bedside.  The chaplain understands the Pt. daughter may be his first choice for HCPOA, but the Pt. is unsure if she will accept the role and how to ask. The Pt. does not respond to the option of exploring his brother as a Scientist, research (medical).   The Pt. was able to articulate the value of having the ACP in place in the context of his health. The Pt. becomes tearful as the chaplain reviews the healthcare decision makers designated by [facility] hierarchy without an AD.  The chaplain understands the Pt. wife died about a year ago. The chaplain paused and offered an opportunity to talk about the loss. The Pt. responded with silence.   The Pt. agrees  with the chaplain returning for a F/U visit and keeping the AD document with the chaplain's contact information. The chaplain will communicate with the PMT for a best time for a visit.  The chaplain updated the Pt. RN-Bryan.

## 2020-08-27 NOTE — Progress Notes (Signed)
Called placed to care link at 1238 to schedule patient transport for radiation treatment.

## 2020-08-27 NOTE — Progress Notes (Signed)
Daily Progress Note   Patient Name: Cody Douglas       Date: 08/27/2020 DOB: 04-06-1950  Age: 70 y.o. MRN#: [id number] Attending Physician: Charlynne Cousins, MD Primary Care Physician: Tamsen Roers, MD Admit Date: 08/20/2020  Reason for Consultation/Follow-up: Establishing goals of care  Subjective: No complaints  Length of Stay: 7  Current Medications: Scheduled Meds:  . ALPRAZolam  0.5 mg Oral TID  . atorvastatin  40 mg Oral Daily  . feeding supplement  237 mL Oral BID BM  . folic acid  1 mg Oral Daily  . lidocaine  1 patch Transdermal QHS  . multivitamin with minerals  1 tablet Oral Daily  . nystatin  5 mL Mouth/Throat QID  . polyethylene glycol  17 g Oral Daily  . thiamine  100 mg Oral Daily   Or  . thiamine  100 mg Intravenous Daily    Continuous Infusions: . sodium chloride 20 mL/hr at 08/27/20 0339  . cefTRIAXone (ROCEPHIN)  IV 2 g (08/27/20 0342)  . heparin 1,450 Units/hr (08/27/20 1057)  . vancomycin 750 mg (08/27/20 0512)    PRN Meds: acetaminophen **OR** acetaminophen (TYLENOL) oral liquid 160 mg/5 mL **OR** acetaminophen, bisacodyl, bisacodyl, Muscle Rub, oxyCODONE-acetaminophen  Physical Exam Constitutional:      General: He is not in acute distress.    Comments: tearful  Pulmonary:     Effort: Pulmonary effort is normal.  Skin:    General: Skin is warm and dry.  Neurological:     Mental Status: He is alert and oriented to person, place, and time.  Psychiatric:        Attention and Perception: Attention normal.        Mood and Affect: Affect is tearful.        Behavior: Behavior is withdrawn. Behavior is cooperative.        Cognition and Memory: He exhibits impaired recent memory.            Vital Signs: BP 120/65   Pulse 89   Temp 98.1 F (36.7 C)  (Oral)   Resp 10   Ht 5\' 11"  (1.803 m)   Wt 64.5 kg   SpO2 95%   BMI 19.82 kg/m  SpO2: SpO2: 95 % O2 Device: O2 Device: Room Air O2 Flow Rate: O2 Flow Rate (L/min): 2 L/min  Intake/output summary:  Intake/Output Summary (Last 24 hours) at 08/27/2020 1530 Last data filed at 08/27/2020 9924 Gross per 24 hour  Intake 2499.1 ml  Output --  Net 2499.1 ml   LBM: Last BM Date: 08/20/20 Baseline Weight: Weight: 64.5 kg Most recent weight: Weight: 64.5 kg       Palliative Assessment/Data: PPS 50%    Flowsheet Rows    Flowsheet Row Most Recent Value  Intake Tab   Referral Department Hospitalist  Unit at Time of Referral Cardiac/Telemetry Unit  Palliative Care Primary Diagnosis Cancer  Date Notified 08/25/20  Palliative Care Type New Palliative care  Reason for referral Clarify Goals of Care  Date of Admission 08/20/20  Date first seen by Palliative Care 08/26/20  # of days Palliative referral response time 1 Day(s)  # of days IP prior to Palliative referral 5  Clinical Assessment   Psychosocial & Spiritual Assessment   Palliative Care Outcomes        Patient Active Problem List   Diagnosis Date Noted  . DNR (do not resuscitate) 08/26/2020  . 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 Thrush 08/03/2020  . Encounter for antineoplastic chemotherapy 07/21/2020  . Endobronchial mass   . Malignant neoplasm of upper lobe of right lung ([place]) 07/06/2020  . Acute ischemic stroke ([place]) 07/01/2020  . Essential hypertension 06/30/2020  . Alcohol use 06/30/2020  . Leukocytosis 06/30/2020  . Abnormal ECG 12/13/2013  . CAP (community acquired pneumonia) 12/13/2013  . Chest pain 12/12/2013  . Hyponatremia 12/12/2013  . Hypokalemia 12/12/2013  . Tobacco abuse 12/12/2013    Palliative Care Assessment & Plan   HPI: 70 y.o. male  with past medical history of  HTN, prostate CA, smoking, EtOH abuse, ischemic stroke in April 2022, and stg 3 NSCLC on chemo and radiation admitted on 08/20/2020  with  L arm weakness and L facial droop / dyarthria. Diagnosed with an acute ischemic stroke. 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 He is not a candidate for surgical intervention. PMT consulted to discuss [place].  Assessment: Family meeting today to include patient, 2 brothers, sister-in-law, mother, and patient's daughter on speaker phone.   Patient granted permission to go over medical course.  We discussed patient's lung cancer and his noninfectious aortic mass. We discussed his recent strokes and highly likely recurrence.  We discussed current treatment with IV heparin. Discussed plan to transition to LMWH long-term. Discussed poor tolerance of anticoagulants previously. We discussed his short term memory loss. We discuss that his heart mass is more concerning than his lung cancer at this point - he is shocked by this and becomes tearful. Patient and family both acknowledge severity of situation of poor prognosis.   I asked [name] to share with me what was most important to him - he tells me several times that he does not want to be kept alive artificially.   I detailed a full scope medical path vs a comfort focused path for him. We discussed continuing on the path he was on prior to hospitalization which would include continuing rehab and cancer treatment. We discussed that if we went this route we would have to acknowledge that he is at high risk for acute decline and have a plan for how to respond when that decline happens. We also discussed a path that included cessation of cancer treatment and focusing on his comfort and symptom management. We discussed involving hospice to support him.  After family weighed in and all questions were answered, patient voiced a desire to stop cancer treatment as he does not feel he is benefiting from it - it is not adding to his quality of life. He also would like  to involve hospice to support him.   We discussed logistics of where he could receive his hospice support - we discussed the hospice facility may not make sense yet as he has not had his acute decline, he is still ambulatory and eating. We also discussed that home is not an option as there is no one to provide care for him at home and he is too weak to live independently. We discussed the options of going to a LTC facility for care and asking hospice to come and support him there. Mr. Gonyea does have medicaid and medicare  to support this option. Mr. Brinegar would like to proceed with discharge to LTC facility with hospice support.   Family and patient would like to follow up with my colleague tomorrow at 71 to ensure no questions arise.  I have made referral to [facility] for above.  Recommendations/Plan: Patient would like to discontinue chemo and radiation treatments He would like to discharge to a LTC facility with hospice support (NO REHAB) to focus on comfort and quality of life - he does have medicare and medicaid Code status is confirmed as DNR He would like to continue anticoagulation at discharge if he continues to tolerate it during hospitalization Patient does have some short term memory loss - he will need support of family throughout hospitalization   Goals of Care and Additional Recommendations: Limitations on Scope of Treatment: Avoid Hospitalization, Minimize Medications, No Chemotherapy, No Radiation, and No Surgical Procedures  Code Status: DNR  Prognosis:  Poor prognosis - at high risk for acute decline  Discharge Planning: [place] with Hospice  Care plan was discussed with patient, his family including daughter  Thank you for allowing the Palliative Medicine Team to assist in the care of this patient.   Total Time 90 minutes Prolonged Time Billed  yes       Greater than 50%  of this time was spent counseling and coordinating care related to the above  assessment and plan.  Juel Burrow, DNP, [facility] Palliative Medicine Team Team Phone # [phone]  Pager [phone]

## 2020-08-27 NOTE — Progress Notes (Signed)
[facility] PROGRESS NOTE    Progress Note  Cody Douglas  WNI:[id number] DOB: [birth year] DOA: 08/20/2020 PCP: Tamsen Roers, MD     Brief Narrative:   Cody Douglas is an 70 y.o. male past medical history of non-small cell cancer on chemo and radiation therapy, essential hypertension, also prostate cancer comes in from [facility] for left-sided weakness, he was recently discharged from the hospital 07/15/2020 for acute right-sided CVA with left-sided weakness transferred to skilled rehab.  Who comes back in for new CVA to the right basal ganglia parietal lobe matter right frontal and occipital lobes.  Assessment/Plan:   Acute ischemic stroke [facility]): MRI of the brain showed patchy multiple infarcts to the right basal ganglia, parietal white matter right frontal and occipital cortices. 2D echo showed a mobile mass in the aortic area probably not infected bacterial endocarditis. Neurology cardiology and internal medicine agreed to start IV heparin. We have consulted palliative care to meet with the family to discuss comfortable goals of care. Meeting will happen on 08/27/2020.  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 Has remained afebrile with no leukocytosis off antibiotics, culture data sent on 08/24/2020 and has remained negative till date. Cardiology was consulted recommended IV heparin, aspirin was discontinued. This is likely the source of his recurrent stroke.  Unfortunately patient not a candidate for surgical intervention. Overall has a poor prognosis and high recurrence of embolic events. Palliative care to meet with family on 08/27/2020.  Cognitive assessment: Will need evaluation as an outpatient.  Stage III squamous cell lung cancer: Currently on chemo and radiation he was scheduled for treatment on 08/23/2020 which has been  postponed. For radiation therapy today.  Essential hypertension/grade 2 diastolic heart failure: He is currently on metoprolol will need to allow permissive hypertension.  Oral thrush: Likely related to chemotherapy started on nystatin.  Alcohol abuse: Treated with thiamine and folate.   DVT prophylaxis: heparin Family Communication:duaghter Status is: Inpatient  Remains inpatient appropriate because:Hemodynamically unstable  Dispo: The patient is from: SNF              Anticipated d/c is to: SNF              Patient currently is not medically stable to d/c.   Difficult to place patient No     Code Status:     Code Status Orders  (From admission, onward)           Start     Ordered   08/20/20 2123  Do not attempt resuscitation (DNR)  Continuous       Question Answer Comment  In the event of cardiac or respiratory ARREST Do not call a "code blue"   In the event of cardiac or respiratory ARREST Do not perform Intubation, CPR, defibrillation or ACLS   In the event of cardiac or respiratory ARREST Use medication by any route, position, wound care, and other measures to relive pain and suffering. May use oxygen, suction and manual treatment of airway obstruction as needed for comfort.   Comments yellow form at bedside      08/20/20 2122           Code Status History     Date Active Date Inactive Code Status Order ID Comments User Context   08/20/2020 2039 08/20/2020 2122 Full Code [id number]  Etta Quill, DO ED   06/30/2020 2219  07/15/2020 1956 Full Code [id number]  Marcelyn Bruins, MD ED   12/13/2013 0223 12/14/2013 1627 Full Code [id number]  Toy Baker, MD Inpatient   12/13/2013 0113 12/13/2013 0223 Full Code [id number]  Toy Baker, MD Inpatient      Advance Directive Documentation    Flowsheet Row Most Recent Value  Type of Advance Directive Out of facility DNR (pink MOST or yellow form)  Pre-existing out of facility DNR order (yellow form  or pink MOST form) Pink MOST/Yellow Form most recent copy in chart - Physician notified to receive inpatient order  "MOST" Form in Place? --         IV Access:   Peripheral IV   Procedures and diagnostic studies:   No results found.   Medical Consultants:   None.   Subjective:    Cody Douglas no new complaints this morning tolerate his diet.  Objective:    Vitals:   08/26/20 1955 08/27/20 0000 08/27/20 0411 08/27/20 0825  BP: 112/66 102/64 133/74   Pulse: 87 77 88   Resp: 20 19 (!) 21   Temp: 98.2 F (36.8 C) 98.4 F (36.9 C) 98.1 F (36.7 C) (!) 97.5 F (36.4 C)  TempSrc: Oral Oral Oral Oral  SpO2: 96% 94% 95%   Weight:      Height:       SpO2: 95 % O2 Flow Rate (L/min): 2 L/min   Intake/Output Summary (Last 24 hours) at 08/27/2020 0908 Last data filed at 08/27/2020 0618 Gross per 24 hour  Intake 2499.1 ml  Output --  Net 2499.1 ml    Filed Weights   08/20/20 1859  Weight: 64.5 kg    Exam: General exam: In no acute distress. Respiratory system: Good air movement and clear to auscultation. Cardiovascular system: S1 & S2 heard, RRR. No JVD. Gastrointestinal system: Abdomen is nondistended, soft and nontender.  Extremities: No pedal edema. Skin: No rashes, lesions or ulcers Psychiatry: Judgement and insight appear normal. Mood & affect appropriate.   Data Reviewed:    Labs: Basic Metabolic Panel: Recent Labs  Lab 08/20/20 1901 08/20/20 2016 08/21/20 0141 08/22/20 0843 08/23/20 0337  NA 138 140  --  137 138  K 3.8 3.6  --  3.6 4.1  CL 101 103  --  104 105  CO2 25  --   --  26 28  GLUCOSE 118* 109*  --  108* 119*  BUN 9 11  --  13 13  CREATININE 0.51* 0.40*  --  0.45* 0.57*  CALCIUM 9.1  --   --  9.0 8.8*  MG  --   --  1.9  --  1.9  PHOS  --   --  3.0  --   --     GFR Estimated Creatinine Clearance: 77.3 mL/min (A) (by C-G formula based on SCr of 0.57 mg/dL (L)). Liver Function Tests: Recent Labs  Lab 08/20/20 1901  08/22/20 0843 08/23/20 0337  AST 18 16 14*  ALT 15 14 12   ALKPHOS 93 99 86  BILITOT 0.8 0.7 0.4  PROT 6.5 6.0* 5.7*  ALBUMIN 3.3* 3.0* 2.8*    No results for input(s): LIPASE, AMYLASE in the last 168 hours. No results for input(s): AMMONIA in the last 168 hours. Coagulation profile Recent Labs  Lab 08/20/20 1901  INR 1.0    COVID-19 Labs  No results for input(s): DDIMER, FERRITIN, LDH, CRP in the last 72 hours.  Lab Results  Component Value Date   SARSCOV2NAA  NEGATIVE 08/23/2020   SARSCOV2NAA NEGATIVE 08/20/2020   SARSCOV2NAA NEGATIVE 07/15/2020   [place] NEGATIVE 07/12/2020    CBC: Recent Labs  Lab 08/20/20 1901 08/20/20 2016 08/22/20 0843 08/25/20 0324 08/26/20 0443 08/27/20 0415  WBC 6.6  --  5.3 4.6 4.4 5.0  NEUTROABS 5.4  --   --   --   --   --   HGB 11.5* 13.9 11.5* 10.8* 11.3* 10.7*  HCT 36.8* 41.0 36.3* 33.2* 35.3* 34.0*  MCV 92.7  --  93.1 92.0 91.7 91.9  PLT 316  --  318 283 246 240    Cardiac Enzymes: No results for input(s): CKTOTAL, CKMB, CKMBINDEX, TROPONINI in the last 168 hours. BNP (last 3 results) No results for input(s): PROBNP in the last 8760 hours. CBG: Recent Labs  Lab 08/20/20 1902 08/23/20 2001  GLUCAP 106* 113*    D-Dimer: No results for input(s): DDIMER in the last 72 hours. Hgb A1c: No results for input(s): HGBA1C in the last 72 hours. Lipid Profile: No results for input(s): CHOL, HDL, LDLCALC, TRIG, CHOLHDL, LDLDIRECT in the last 72 hours. Thyroid function studies: No results for input(s): TSH, T4TOTAL, T3FREE, THYROIDAB in the last 72 hours.  Invalid input(s): FREET3 Anemia work up: No results for input(s): VITAMINB12, FOLATE, FERRITIN, TIBC, IRON, RETICCTPCT in the last 72 hours. Sepsis Labs: Recent Labs  Lab 08/22/20 0843 08/25/20 0324 08/26/20 0443 08/27/20 0415  WBC 5.3 4.6 4.4 5.0    Microbiology Recent Results (from the past 240 hour(s))  SARS CORONAVIRUS 2 (TAT 6-24 HRS) Nasopharyngeal  Nasopharyngeal Swab     Status: None   Collection Time: 08/20/20  9:36 PM   Specimen: Nasopharyngeal Swab  Result Value Ref Range Status   SARS Coronavirus 2 NEGATIVE NEGATIVE Final    Comment: (NOTE) SARS-CoV-2 target nucleic acids are NOT DETECTED.  The SARS-CoV-2 RNA is generally detectable in upper and lower respiratory specimens during the acute phase of infection. Negative results do not preclude SARS-CoV-2 infection, do not rule out co-infections with other pathogens, and should not be used as the sole basis for treatment or other patient management decisions. Negative results must be combined with clinical observations, patient history, and epidemiological information. The expected result is Negative.  Fact Sheet for Patients: SugarRoll.be  Fact Sheet for Healthcare Providers: https://www.woods-mathews.com/  This test is not yet approved or cleared by the [place] FDA and  has been authorized for detection and/or diagnosis of SARS-CoV-2 by FDA under an Emergency Use Authorization (EUA). This EUA will remain  in effect (meaning this test can be used) for the duration of the COVID-19 declaration under Se ction 564(b)(1) of the Act, 21 U.S.C. section 360bbb-3(b)(1), unless the authorization is terminated or revoked sooner.  Performed at [facility], [place] [address], [place], [place] [address]   Resp Panel by RT-PCR (Flu A&B, Covid) Nasopharyngeal Swab     Status: None   Collection Time: 08/23/20 12:06 PM   Specimen: Nasopharyngeal Swab; Nasopharyngeal(NP) swabs in vial transport medium  Result Value Ref Range Status   SARS Coronavirus 2 by RT PCR NEGATIVE NEGATIVE Final    Comment: (NOTE) SARS-CoV-2 target nucleic acids are NOT DETECTED.  The SARS-CoV-2 RNA is generally detectable in upper respiratory specimens during the acute phase of infection. The lowest concentration of SARS-CoV-2 viral copies this assay can  detect is 138 copies/mL. A negative result does not preclude SARS-Cov-2 infection and should not be used as the sole basis for treatment or other patient management  decisions. A negative result may occur with  improper specimen collection/handling, submission of specimen other than nasopharyngeal swab, presence of viral mutation(s) within the areas targeted by this assay, and inadequate number of viral copies(<138 copies/mL). A negative result must be combined with clinical observations, patient history, and epidemiological information. The expected result is Negative.  Fact Sheet for Patients:  EntrepreneurPulse.com.au  Fact Sheet for Healthcare Providers:  IncredibleEmployment.be  This test is no t yet approved or cleared by the [place] FDA and  has been authorized for detection and/or diagnosis of SARS-CoV-2 by FDA under an Emergency Use Authorization (EUA). This EUA will remain  in effect (meaning this test can be used) for the duration of the COVID-19 declaration under Section 564(b)(1) of the Act, 21 U.S.C.section 360bbb-3(b)(1), unless the authorization is terminated  or revoked sooner.       Influenza A by PCR NEGATIVE NEGATIVE Final   Influenza B by PCR NEGATIVE NEGATIVE Final    Comment: (NOTE) The Xpert Xpress SARS-CoV-2/FLU/RSV plus assay is intended as an aid in the diagnosis of influenza from Nasopharyngeal swab specimens and should not be used as a sole basis for treatment. Nasal washings and aspirates are unacceptable for Xpert Xpress SARS-CoV-2/FLU/RSV testing.  Fact Sheet for Patients: EntrepreneurPulse.com.au  Fact Sheet for Healthcare Providers: IncredibleEmployment.be  This test is not yet approved or cleared by the [place] FDA and has been authorized for detection and/or diagnosis of SARS-CoV-2 by FDA under an Emergency Use Authorization (EUA). This EUA will remain in  effect (meaning this test can be used) for the duration of the COVID-19 declaration under Section 564(b)(1) of the Act, 21 U.S.C. section 360bbb-3(b)(1), unless the authorization is terminated or revoked.  Performed at [facility], [place] [address] [place], [place] [address]   Culture, blood (routine x 2)     Status: None (Preliminary result)   Collection Time: 08/24/20  6:00 PM   Specimen: BLOOD  Result Value Ref Range Status   Specimen Description BLOOD RIGHT ANTECUBITAL  Final   Special Requests   Final    BOTTLES DRAWN AEROBIC AND ANAEROBIC Blood Culture adequate volume   Culture   Final    NO GROWTH 3 DAYS Performed at [facility], [place] [address] [place], [place] [address]    Report Status PENDING  Incomplete  Culture, blood (routine x 2)     Status: Abnormal   Collection Time: 08/24/20  6:05 PM   Specimen: BLOOD  Result Value Ref Range Status   Specimen Description BLOOD BLOOD RIGHT FOREARM  Final   Special Requests   Final    BOTTLES DRAWN AEROBIC AND ANAEROBIC Blood Culture results may not be optimal due to an inadequate volume of blood received in culture bottles   Culture  Setup Time   Final    GRAM POSITIVE COCCI IN CLUSTERS IN BOTH AEROBIC AND ANAEROBIC BOTTLES CRITICAL RESULT CALLED TO, READ BACK BY AND VERIFIED WITH: J. LEDFORD PHARMD, AT 1696 08/25/20 D. VANHOOK    Culture (A)  Final    AEROCOCCUS URINAE THE SIGNIFICANCE OF ISOLATING THIS ORGANISM FROM A SINGLE SET OF BLOOD CULTURES WHEN MULTIPLE SETS ARE DRAWN IS UNCERTAIN. PLEASE NOTIFY THE MICROBIOLOGY DEPARTMENT WITHIN ONE WEEK IF SPECIATION AND SENSITIVITIES ARE REQUIRED. Performed at [facility], [place] [address] [place], [place] [address]    Report Status 08/27/2020 FINAL  Final  Blood Culture ID Panel (Reflexed)     Status: None   Collection Time:  08/24/20  6:05 PM  Result Value Ref Range Status   Enterococcus faecalis NOT DETECTED NOT DETECTED Final   Enterococcus Faecium NOT  DETECTED NOT DETECTED Final   Listeria monocytogenes NOT DETECTED NOT DETECTED Final   Staphylococcus species NOT DETECTED NOT DETECTED Final   Staphylococcus aureus (BCID) NOT DETECTED NOT DETECTED Final   Staphylococcus epidermidis NOT DETECTED NOT DETECTED Final   Staphylococcus lugdunensis NOT DETECTED NOT DETECTED Final   Streptococcus species NOT DETECTED NOT DETECTED Final   Streptococcus agalactiae NOT DETECTED NOT DETECTED Final   Streptococcus pneumoniae NOT DETECTED NOT DETECTED Final   Streptococcus pyogenes NOT DETECTED NOT DETECTED Final   A.calcoaceticus-baumannii NOT DETECTED NOT DETECTED Final   Bacteroides fragilis NOT DETECTED NOT DETECTED Final   Enterobacterales NOT DETECTED NOT DETECTED Final   Enterobacter cloacae complex NOT DETECTED NOT DETECTED Final   Escherichia coli NOT DETECTED NOT DETECTED Final   Klebsiella aerogenes NOT DETECTED NOT DETECTED Final   Klebsiella oxytoca NOT DETECTED NOT DETECTED Final   Klebsiella pneumoniae NOT DETECTED NOT DETECTED Final   Proteus species NOT DETECTED NOT DETECTED Final   Salmonella species NOT DETECTED NOT DETECTED Final   Serratia marcescens NOT DETECTED NOT DETECTED Final   Haemophilus influenzae NOT DETECTED NOT DETECTED Final   Neisseria meningitidis NOT DETECTED NOT DETECTED Final   Pseudomonas aeruginosa NOT DETECTED NOT DETECTED Final   Stenotrophomonas maltophilia NOT DETECTED NOT DETECTED Final   Candida albicans NOT DETECTED NOT DETECTED Final   Candida auris NOT DETECTED NOT DETECTED Final   Candida glabrata NOT DETECTED NOT DETECTED Final   Candida krusei NOT DETECTED NOT DETECTED Final   Candida parapsilosis NOT DETECTED NOT DETECTED Final   Candida tropicalis NOT DETECTED NOT DETECTED Final   Cryptococcus neoformans/gattii NOT DETECTED NOT DETECTED Final    Comment: Performed at [facility], [address] [place], [place] [address]     Medications:    ALPRAZolam  0.5 mg Oral TID    atorvastatin  40 mg Oral Daily   feeding supplement  237 mL Oral BID BM   folic acid  1 mg Oral Daily   lidocaine  1 patch Transdermal QHS   multivitamin with minerals  1 tablet Oral Daily   nystatin  5 mL Mouth/Throat QID   polyethylene glycol  17 g Oral Daily   thiamine  100 mg Oral Daily   Or   thiamine  100 mg Intravenous Daily   Continuous Infusions:  sodium chloride 20 mL/hr at 08/27/20 0339   cefTRIAXone (ROCEPHIN)  IV 2 g (08/27/20 0342)   heparin 1,350 Units/hr (08/27/20 0348)   vancomycin 750 mg (08/27/20 0512)      LOS: 7 days   Charlynne Cousins  [facility]  08/27/2020, 9:08 AM

## 2020-08-27 NOTE — Progress Notes (Signed)
  Speech Language Pathology Treatment: Cognitive-Linquistic  Patient Details Name: Cody Douglas MRN: [id number] DOB: [birth year] Today's Date: 08/27/2020 Time: 1638-4536 SLP Time Calculation (min) (ACUTE ONLY): 20 min  Assessment / Plan / Recommendation Clinical Impression  Pt was seen for ongoing cognitive linguistic treatment.  Pt's function appears to be improving.  Today pt completed calculations for time, money, and medication management with 100% accuracy without any cues and without extended processing time.  He completed verbal sequencing task for ADLS with 468% accuracy with only minimal cuing.  Pt demonstrated functional problem solving for current situation by asking nurse for assistance with environment.  Pt benefited from verbal cue to recall RN instructions x1 after ~15 minute delay. Pt was able to speak about discharge planning (stated insure was not going to pay for SNF/[facility], and trying to figure out d/c plan at present) and is eager to leave.  Recommend continuing ST while pt is in house and at next level of care.     HPI HPI: Cody Douglas is a 70 y.o. male with medical history significant of HTN, prostate CA, smoking and EtOH abuse. He has spent past 3 weeks in rehab. Pt presents to ED 08/20/20 with c/o L arm weakness and L facial droop / dyarthria.  These are similar to prior stroke symptoms but worse. MRI Head reveals Patchy small volume acute ischemic infarcts involving the right  basal ganglia, right periatrial white matter, and overlying right  frontal and occipital cortices as above. Probable mild associated petechial hemorrhage at the right occipital region. Additional chronic left frontal infarct, with a few additional  scattered remote bilateral cerebellar infarcts.      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

## 2020-08-27 NOTE — Progress Notes (Signed)
ANTICOAGULATION CONSULT NOTE - Follow Up Consult  Pharmacy Consult for heparin Indication:  AV thrombus and recurrent CVA  No Known Allergies  Patient Measurements: Height: 5\' 11"  (180.3 cm) Weight: 64.5 kg (142 lb 1.6 oz) IBW/kg (Calculated) : 75.3 Heparin Dosing Weight: 64.5 kg  Vital Signs: Temp: 97.5 F (36.4 C) (06/17 0825) Temp Source: Oral (06/17 0825) BP: 133/74 (06/17 0411) Pulse Rate: 88 (06/17 0411)  Labs: Recent Labs    08/25/20 0324 08/25/20 1057 08/25/20 2017 08/26/20 0443 08/27/20 0415  HGB 10.8*  --   --  11.3* 10.7*  HCT 33.2*  --   --  35.3* 34.0*  PLT 283  --   --  246 240  HEPARINUNFRC 0.15*   < > 0.24* 0.37 0.26*   < > = values in this interval not displayed.     Estimated Creatinine Clearance: 77.3 mL/min (A) (by C-G formula based on SCr of 0.57 mg/dL (L)).   Medications:  Scheduled:   ALPRAZolam  0.5 mg Oral TID   atorvastatin  40 mg Oral Daily   feeding supplement  237 mL Oral BID BM   folic acid  1 mg Oral Daily   lidocaine  1 patch Transdermal QHS   multivitamin with minerals  1 tablet Oral Daily   nystatin  5 mL Mouth/Throat QID   polyethylene glycol  17 g Oral Daily   thiamine  100 mg Oral Daily   Or   thiamine  100 mg Intravenous Daily   Infusions:   sodium chloride 20 mL/hr at 08/27/20 0339   cefTRIAXone (ROCEPHIN)  IV 2 g (08/27/20 0342)   heparin 1,350 Units/hr (08/27/20 0348)   vancomycin 750 mg (08/27/20 4287)    Assessment:  70 yo M admitted with L arm weakness and L facial droop / dyarthria.  Recent CVA April 2022 with similar symptoms, now worse.  TEE showed mobile mass and pt was started on IV heparin.  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  Heparin level slightly subtherapeutic on 1350 units/hr.  No bleeding noted.  Goal of Therapy:  Heparin level 0.3-0.5 units/ml Monitor platelets by anticoagulation protocol: Yes   Plan:  Increase heparin to  1450 units/hr Daily heparin level and CBC while on heparin. Follow-up plans for long term anticoagulation.  [employer], Pharm.D., BCPS Clinical Pharmacist Clinical phone for 08/27/2020 from 7:30-3:00 is [phone].  **Pharmacist phone directory can be found on [email or web address] listed under [place].  08/27/2020 8:53 AM

## 2020-08-27 NOTE — Progress Notes (Signed)
PT Cancellation Note  Patient Details Name: Cody Douglas MRN: [id number] DOB: [birth year]   Cancelled Treatment:    Reason Eval/Treat Not Completed: Other (comment) Currently in meeting with palliative and family. PT will re-attempt as time allows.   Merced Brougham A. Gilford Rile PT, DPT Acute Rehabilitation Services Pager [phone] Office [phone]    Linna Hoff 08/27/2020, 4:34 PM

## 2020-08-28 DIAGNOSIS — Z515 Encounter for palliative care: Secondary | ICD-10-CM | POA: Diagnosis not present

## 2020-08-28 DIAGNOSIS — C3411 Malignant neoplasm of upper lobe, right bronchus or lung: Secondary | ICD-10-CM

## 2020-08-28 DIAGNOSIS — I639 Cerebral infarction, unspecified: Secondary | ICD-10-CM | POA: Diagnosis not present

## 2020-08-28 DIAGNOSIS — I058 Other rheumatic mitral valve diseases: Secondary | ICD-10-CM | POA: Diagnosis not present

## 2020-08-28 DIAGNOSIS — I10 Essential (primary) hypertension: Secondary | ICD-10-CM | POA: Diagnosis not present

## 2020-08-28 DIAGNOSIS — I38 Endocarditis, valve unspecified: Secondary | ICD-10-CM

## 2020-08-28 LAB — CBC
HCT: 33.5 % — ABNORMAL LOW (ref 39.0–52.0)
Hemoglobin: 10.9 g/dL — ABNORMAL LOW (ref 13.0–17.0)
MCH: 29.1 pg (ref 26.0–34.0)
MCHC: 32.5 g/dL (ref 30.0–36.0)
MCV: 89.6 fL (ref 80.0–100.0)
Platelets: 230 10*3/uL (ref 150–400)
RBC: 3.74 MIL/uL — ABNORMAL LOW (ref 4.22–5.81)
RDW: 14.9 % (ref 11.5–15.5)
WBC: 5.8 10*3/uL (ref 4.0–10.5)
nRBC: 0 % (ref 0.0–0.2)

## 2020-08-28 LAB — BASIC METABOLIC PANEL
Anion gap: 9 (ref 5–15)
BUN: 8 mg/dL (ref 8–23)
CO2: 22 mmol/L (ref 22–32)
Calcium: 8.8 mg/dL — ABNORMAL LOW (ref 8.9–10.3)
Chloride: 103 mmol/L (ref 98–111)
Creatinine, Ser: 0.43 mg/dL — ABNORMAL LOW (ref 0.61–1.24)
GFR, Estimated: >60 mL/min (ref >60)
Glucose, Bld: 109 mg/dL — ABNORMAL HIGH (ref 70–99)
Potassium: 3.9 mmol/L (ref 3.5–5.1)
Sodium: 134 mmol/L — ABNORMAL LOW (ref 135–145)

## 2020-08-28 MED ORDER — ENOXAPARIN SODIUM 60 MG/0.6ML IJ SOSY
60.0000 mg | PREFILLED_SYRINGE | Freq: Two times a day (BID) | INTRAMUSCULAR | Status: DC
  Administered 2020-08-28 – 2020-08-30 (×5): 60 mg via SUBCUTANEOUS
  Filled 2020-08-28 (×5): qty 0.6

## 2020-08-28 MED ORDER — SENNA 8.6 MG PO TABS
1.0000 | ORAL_TABLET | Freq: Every day | ORAL | Status: DC
  Administered 2020-08-29: 8.6 mg via ORAL
  Filled 2020-08-28: qty 1

## 2020-08-28 MED ORDER — BIOTENE DRY MOUTH MT LIQD: 15.0000 mL | OROMUCOSAL | Status: DC | PRN

## 2020-08-28 MED ORDER — SODIUM CHLORIDE 0.9% FLUSH: 3.0000 mL | INTRAVENOUS | Status: DC | PRN

## 2020-08-28 MED ORDER — ONDANSETRON 4 MG PO TBDP: 4.0000 mg | ORAL_TABLET | Freq: Four times a day (QID) | ORAL | Status: DC | PRN

## 2020-08-28 MED ORDER — SODIUM CHLORIDE 0.9% FLUSH
3.0000 mL | Freq: Two times a day (BID) | INTRAVENOUS | Status: DC
  Administered 2020-08-28 – 2020-08-30 (×4): 3 mL via INTRAVENOUS

## 2020-08-28 MED ORDER — GLYCOPYRROLATE 0.2 MG/ML IJ SOLN: 0.2000 mg | INTRAMUSCULAR | Status: DC | PRN

## 2020-08-28 MED ORDER — OXYCODONE-ACETAMINOPHEN 5-325 MG PO TABS
1.0000 | ORAL_TABLET | Freq: Three times a day (TID) | ORAL | Status: DC
  Administered 2020-08-28 – 2020-08-29 (×3): 1 via ORAL
  Filled 2020-08-28 (×3): qty 1

## 2020-08-28 MED ORDER — GLYCOPYRROLATE 1 MG PO TABS
1.0000 mg | ORAL_TABLET | ORAL | Status: DC | PRN
Start: 2020-08-28 — End: 2020-08-28

## 2020-08-28 MED ORDER — HALOPERIDOL 0.5 MG PO TABS: 0.5000 mg | ORAL_TABLET | ORAL | Status: DC | PRN

## 2020-08-28 MED ORDER — TRAZODONE HCL 50 MG PO TABS
50.0000 mg | ORAL_TABLET | Freq: Every day | ORAL | Status: DC
  Administered 2020-08-28 – 2020-08-29 (×2): 50 mg via ORAL
  Filled 2020-08-28 (×2): qty 1

## 2020-08-28 MED ORDER — SODIUM CHLORIDE 0.9 % IV SOLN: 250.0000 mL | INTRAVENOUS | Status: DC | PRN

## 2020-08-28 MED ORDER — HALOPERIDOL LACTATE 2 MG/ML PO CONC: 0.5000 mg | ORAL | Status: DC | PRN

## 2020-08-28 MED ORDER — MORPHINE SULFATE (PF) 2 MG/ML IV SOLN
2.0000 mg | INTRAVENOUS | Status: DC | PRN
  Administered 2020-08-29 (×3): 2 mg via INTRAVENOUS
  Filled 2020-08-28 (×3): qty 1

## 2020-08-28 MED ORDER — ONDANSETRON HCL 4 MG/2ML IJ SOLN: 4.0000 mg | Freq: Four times a day (QID) | INTRAMUSCULAR | Status: DC | PRN

## 2020-08-28 MED ORDER — POLYVINYL ALCOHOL 1.4 % OP SOLN
1.0000 [drp] | Freq: Four times a day (QID) | OPHTHALMIC | Status: DC | PRN
  Filled 2020-08-28: qty 15

## 2020-08-28 MED ORDER — HALOPERIDOL LACTATE 5 MG/ML IJ SOLN: 0.5000 mg | INTRAMUSCULAR | Status: DC | PRN

## 2020-08-28 MED ORDER — OXYCODONE HCL 5 MG PO TABS
5.0000 mg | ORAL_TABLET | ORAL | Status: DC | PRN
  Administered 2020-08-28 – 2020-08-29 (×2): 5 mg via ORAL
  Filled 2020-08-28 (×2): qty 1

## 2020-08-28 MED ORDER — CYCLOBENZAPRINE HCL 10 MG PO TABS
5.0000 mg | ORAL_TABLET | Freq: Three times a day (TID) | ORAL | Status: DC
  Administered 2020-08-28 – 2020-08-30 (×7): 5 mg via ORAL
  Filled 2020-08-28 (×7): qty 1

## 2020-08-28 NOTE — Progress Notes (Signed)
[facility] PROGRESS NOTE    Progress Note  Cody Douglas  XIH:[id number] DOB: 24-Oct-1950 DOA: 08/20/2020 PCP: Tamsen Roers, MD     Brief Narrative:   Cody Douglas is an 70 y.o. male past medical history of non-small cell cancer on chemo and radiation therapy, essential hypertension, also prostate cancer comes in from [facility] for left-sided weakness, he was recently discharged from the hospital 07/15/2020 for acute right-sided CVA with left-sided weakness transferred to skilled rehab.  Who comes back in for new CVA to the right basal ganglia parietal lobe matter right frontal and occipital lobes.  Assessment/Plan:   Acute ischemic stroke [facility]): MRI of the brain showed patchy multiple infarcts to the right basal ganglia, parietal white matter right frontal and occipital cortices. 2D echo showed a mobile mass in the aortic area probably not infected bacterial endocarditis. Transition to Lovenox subcutaneously. Patient was made a DNR/DNI he would like to have no further chemotherapy or radiation.  He would like to be discharged to [facility] facility with hospice support no rehab. He would like to continue the anticoagulation upon discharge. He did also like to avoid hospitalization and minimize medication.  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 Transition to hospice care he will like to continue anticoagulation. Transition to subcutaneous Lovenox. Discontinue antibiotics.  Cognitive assessment: Will need evaluation as an outpatient.  Stage III squamous cell lung cancer: Currently on chemo and radiation he was scheduled for treatment on 08/23/2020 which has been postponed. For radiation therapy today.  Essential hypertension/grade 2 diastolic heart failure: He is currently on metoprolol will need to allow permissive hypertension.  Oral  thrush: Likely related to chemotherapy now resolved stop nystatin.  Alcohol abuse: Treated with thiamine and folate.   DVT prophylaxis: heparin Family Communication:duaghter Status is: Inpatient  Remains inpatient appropriate because:Hemodynamically unstable  Dispo: The patient is from: SNF              Anticipated d/c is to: SNF              Patient currently is not medically stable to d/c.   Difficult to place patient No     Code Status:     Code Status Orders  (From admission, onward)           Start     Ordered   08/20/20 2123  Do not attempt resuscitation (DNR)  Continuous       Question Answer Comment  In the event of cardiac or respiratory ARREST Do not call a "code blue"   In the event of cardiac or respiratory ARREST Do not perform Intubation, CPR, defibrillation or ACLS   In the event of cardiac or respiratory ARREST Use medication by any route, position, wound care, and other measures to relive pain and suffering. May use oxygen, suction and manual treatment of airway obstruction as needed for comfort.   Comments yellow form at bedside      08/20/20 2122           Code Status History     Date Active Date Inactive Code Status Order ID Comments User Context   08/20/2020 2039 08/20/2020 2122 Full Code [id number]  Etta Quill, DO ED   06/30/2020 2219 07/15/2020 1956 Full Code [id number]  Marcelyn Bruins, MD ED   12/13/2013 0223 12/14/2013 1627 Full Code [id number]  Toy Baker, MD Inpatient  12/13/2013 0113 12/13/2013 0223 Full Code [id number]  Toy Baker, MD Inpatient      Advance Directive Documentation    Flowsheet Row Most Recent Value  Type of Advance Directive Out of facility DNR (pink MOST or yellow form)  Pre-existing out of facility DNR order (yellow form or pink MOST form) Pink MOST/Yellow Form most recent copy in chart - Physician notified to receive inpatient order  "MOST" Form in Place? --         IV Access:    Peripheral IV   Procedures and diagnostic studies:   No results found.   Medical Consultants:   None.   Subjective:    Cody Douglas tolerating his diet has not had a bowel movement pain is controlled.  Objective:    Vitals:   08/27/20 1622 08/27/20 1922 08/27/20 2321 08/28/20 0323  BP: 123/73 (!) 146/87 117/67 122/74  Pulse: 93 94 92 85  Resp: 20 16 19 19   Temp: 98.5 F (36.9 C) 98.2 F (36.8 C) 98 F (36.7 C) 98.2 F (36.8 C)  TempSrc: Oral Oral Oral Oral  SpO2: 96% 97% 95% 95%  Weight:      Height:       SpO2: 95 % O2 Flow Rate (L/min): 2 L/min   Intake/Output Summary (Last 24 hours) at 08/28/2020 0714 Last data filed at 08/28/2020 0604 Gross per 24 hour  Intake 913.13 ml  Output 800 ml  Net 113.13 ml    Filed Weights   08/20/20 1859  Weight: 64.5 kg    Exam: General exam: In no acute distress. Respiratory system: Good air movement and clear to auscultation. Cardiovascular system: S1 & S2 heard, RRR. No JVD. Gastrointestinal system: Abdomen is nondistended, soft and nontender.  Extremities: No pedal edema. Skin: No rashes, lesions or ulcers   Data Reviewed:    Labs: Basic Metabolic Panel: Recent Labs  Lab 08/22/20 0843 08/23/20 0337  NA 137 138  K 3.6 4.1  CL 104 105  CO2 26 28  GLUCOSE 108* 119*  BUN 13 13  CREATININE 0.45* 0.57*  CALCIUM 9.0 8.8*  MG  --  1.9    GFR Estimated Creatinine Clearance: 77.3 mL/min (A) (by C-G formula based on SCr of 0.57 mg/dL (L)). Liver Function Tests: Recent Labs  Lab 08/22/20 0843 08/23/20 0337  AST 16 14*  ALT 14 12  ALKPHOS 99 86  BILITOT 0.7 0.4  PROT 6.0* 5.7*  ALBUMIN 3.0* 2.8*    No results for input(s): LIPASE, AMYLASE in the last 168 hours. No results for input(s): AMMONIA in the last 168 hours. Coagulation profile No results for input(s): INR, PROTIME in the last 168 hours.  COVID-19 Labs  No results for input(s): DDIMER, FERRITIN, LDH, CRP in the last 72  hours.  Lab Results  Component Value Date   SARSCOV2NAA NEGATIVE 08/23/2020   SARSCOV2NAA NEGATIVE 08/20/2020   SARSCOV2NAA NEGATIVE 07/15/2020   [place] NEGATIVE 07/12/2020    CBC: Recent Labs  Lab 08/22/20 0843 08/25/20 0324 08/26/20 0443 08/27/20 0415  WBC 5.3 4.6 4.4 5.0  HGB 11.5* 10.8* 11.3* 10.7*  HCT 36.3* 33.2* 35.3* 34.0*  MCV 93.1 92.0 91.7 91.9  PLT 318 283 246 240    Cardiac Enzymes: No results for input(s): CKTOTAL, CKMB, CKMBINDEX, TROPONINI in the last 168 hours. BNP (last 3 results) No results for input(s): PROBNP in the last 8760 hours. CBG: Recent Labs  Lab 08/23/20 2001  GLUCAP 113*    D-Dimer: No results for input(s):  DDIMER in the last 72 hours. Hgb A1c: No results for input(s): HGBA1C in the last 72 hours. Lipid Profile: No results for input(s): CHOL, HDL, LDLCALC, TRIG, CHOLHDL, LDLDIRECT in the last 72 hours. Thyroid function studies: No results for input(s): TSH, T4TOTAL, T3FREE, THYROIDAB in the last 72 hours.  Invalid input(s): FREET3 Anemia work up: No results for input(s): VITAMINB12, FOLATE, FERRITIN, TIBC, IRON, RETICCTPCT in the last 72 hours. Sepsis Labs: Recent Labs  Lab 08/22/20 0843 08/25/20 0324 08/26/20 0443 08/27/20 0415  WBC 5.3 4.6 4.4 5.0    Microbiology Recent Results (from the past 240 hour(s))  SARS CORONAVIRUS 2 (TAT 6-24 HRS) Nasopharyngeal Nasopharyngeal Swab     Status: None   Collection Time: 08/20/20  9:36 PM   Specimen: Nasopharyngeal Swab  Result Value Ref Range Status   SARS Coronavirus 2 NEGATIVE NEGATIVE Final    Comment: (NOTE) SARS-CoV-2 target nucleic acids are NOT DETECTED.  The SARS-CoV-2 RNA is generally detectable in upper and lower respiratory specimens during the acute phase of infection. Negative results do not preclude SARS-CoV-2 infection, do not rule out co-infections with other pathogens, and should not be used as the sole basis for treatment or other patient management  decisions. Negative results must be combined with clinical observations, patient history, and epidemiological information. The expected result is Negative.  Fact Sheet for Patients: SugarRoll.be  Fact Sheet for Healthcare Providers: https://www.woods-mathews.com/  This test is not yet approved or cleared by the [place] FDA and  has been authorized for detection and/or diagnosis of SARS-CoV-2 by FDA under an Emergency Use Authorization (EUA). This EUA will remain  in effect (meaning this test can be used) for the duration of the COVID-19 declaration under Se ction 564(b)(1) of the Act, 21 U.S.C. section 360bbb-3(b)(1), unless the authorization is terminated or revoked sooner.  Performed at [facility], [place] [address] [place], [place] [address]   Resp Panel by RT-PCR (Flu A&B, Covid) Nasopharyngeal Swab     Status: None   Collection Time: 08/23/20 12:06 PM   Specimen: Nasopharyngeal Swab; Nasopharyngeal(NP) swabs in vial transport medium  Result Value Ref Range Status   SARS Coronavirus 2 by RT PCR NEGATIVE NEGATIVE Final    Comment: (NOTE) SARS-CoV-2 target nucleic acids are NOT DETECTED.  The SARS-CoV-2 RNA is generally detectable in upper respiratory specimens during the acute phase of infection. The lowest concentration of SARS-CoV-2 viral copies this assay can detect is 138 copies/mL. A negative result does not preclude SARS-Cov-2 infection and should not be used as the sole basis for treatment or other patient management decisions. A negative result may occur with  improper specimen collection/handling, submission of specimen other than nasopharyngeal swab, presence of viral mutation(s) within the areas targeted by this assay, and inadequate number of viral copies(<138 copies/mL). A negative result must be combined with clinical observations, patient history, and epidemiological information. The expected result is  Negative.  Fact Sheet for Patients:  EntrepreneurPulse.com.au  Fact Sheet for Healthcare Providers:  IncredibleEmployment.be  This test is no t yet approved or cleared by the [place] FDA and  has been authorized for detection and/or diagnosis of SARS-CoV-2 by FDA under an Emergency Use Authorization (EUA). This EUA will remain  in effect (meaning this test can be used) for the duration of the COVID-19 declaration under Section 564(b)(1) of the Act, 21 U.S.C.section 360bbb-3(b)(1), unless the authorization is terminated  or revoked sooner.       Influenza A by PCR NEGATIVE NEGATIVE  Final   Influenza B by PCR NEGATIVE NEGATIVE Final    Comment: (NOTE) The Xpert Xpress SARS-CoV-2/FLU/RSV plus assay is intended as an aid in the diagnosis of influenza from Nasopharyngeal swab specimens and should not be used as a sole basis for treatment. Nasal washings and aspirates are unacceptable for Xpert Xpress SARS-CoV-2/FLU/RSV testing.  Fact Sheet for Patients: EntrepreneurPulse.com.au  Fact Sheet for Healthcare Providers: IncredibleEmployment.be  This test is not yet approved or cleared by the [place] FDA and has been authorized for detection and/or diagnosis of SARS-CoV-2 by FDA under an Emergency Use Authorization (EUA). This EUA will remain in effect (meaning this test can be used) for the duration of the COVID-19 declaration under Section 564(b)(1) of the Act, 21 U.S.C. section 360bbb-3(b)(1), unless the authorization is terminated or revoked.  Performed at [facility], [place] [address] [place], [place] [address]   Culture, blood (routine x 2)     Status: None (Preliminary result)   Collection Time: 08/24/20  6:00 PM   Specimen: BLOOD  Result Value Ref Range Status   Specimen Description BLOOD RIGHT ANTECUBITAL  Final   Special Requests   Final    BOTTLES DRAWN AEROBIC AND ANAEROBIC Blood  Culture adequate volume   Culture   Final    NO GROWTH 3 DAYS Performed at [facility], [place] [address] [place], [place] [address]    Report Status PENDING  Incomplete  Culture, blood (routine x 2)     Status: Abnormal   Collection Time: 08/24/20  6:05 PM   Specimen: BLOOD  Result Value Ref Range Status   Specimen Description BLOOD BLOOD RIGHT FOREARM  Final   Special Requests   Final    BOTTLES DRAWN AEROBIC AND ANAEROBIC Blood Culture results may not be optimal due to an inadequate volume of blood received in culture bottles   Culture  Setup Time   Final    GRAM POSITIVE COCCI IN CLUSTERS IN BOTH AEROBIC AND ANAEROBIC BOTTLES CRITICAL RESULT CALLED TO, READ BACK BY AND VERIFIED WITH: J. LEDFORD PHARMD, AT 4128 08/25/20 D. VANHOOK    Culture (A)  Final    AEROCOCCUS URINAE THE SIGNIFICANCE OF ISOLATING THIS ORGANISM FROM A SINGLE SET OF BLOOD CULTURES WHEN MULTIPLE SETS ARE DRAWN IS UNCERTAIN. PLEASE NOTIFY THE MICROBIOLOGY DEPARTMENT WITHIN ONE WEEK IF SPECIATION AND SENSITIVITIES ARE REQUIRED. Performed at [facility], [place] [address] [place],  [address]    Report Status 08/27/2020 FINAL  Final  Blood Culture ID Panel (Reflexed)     Status: None   Collection Time: 08/24/20  6:05 PM  Result Value Ref Range Status   Enterococcus faecalis NOT DETECTED NOT DETECTED Final   Enterococcus Faecium NOT DETECTED NOT DETECTED Final   Listeria monocytogenes NOT DETECTED NOT DETECTED Final   Staphylococcus species NOT DETECTED NOT DETECTED Final   Staphylococcus aureus (BCID) NOT DETECTED NOT DETECTED Final   Staphylococcus epidermidis NOT DETECTED NOT DETECTED Final   Staphylococcus lugdunensis NOT DETECTED NOT DETECTED Final   Streptococcus species NOT DETECTED NOT DETECTED Final   Streptococcus agalactiae NOT DETECTED NOT DETECTED Final   Streptococcus pneumoniae NOT DETECTED NOT DETECTED Final   Streptococcus pyogenes NOT DETECTED NOT DETECTED Final    A.calcoaceticus-baumannii NOT DETECTED NOT DETECTED Final   Bacteroides fragilis NOT DETECTED NOT DETECTED Final   Enterobacterales NOT DETECTED NOT DETECTED Final   Enterobacter cloacae complex NOT DETECTED NOT DETECTED Final   Escherichia coli NOT DETECTED NOT DETECTED  Final   Klebsiella aerogenes NOT DETECTED NOT DETECTED Final   Klebsiella oxytoca NOT DETECTED NOT DETECTED Final   Klebsiella pneumoniae NOT DETECTED NOT DETECTED Final   Proteus species NOT DETECTED NOT DETECTED Final   Salmonella species NOT DETECTED NOT DETECTED Final   Serratia marcescens NOT DETECTED NOT DETECTED Final   Haemophilus influenzae NOT DETECTED NOT DETECTED Final   Neisseria meningitidis NOT DETECTED NOT DETECTED Final   Pseudomonas aeruginosa NOT DETECTED NOT DETECTED Final   Stenotrophomonas maltophilia NOT DETECTED NOT DETECTED Final   Candida albicans NOT DETECTED NOT DETECTED Final   Candida auris NOT DETECTED NOT DETECTED Final   Candida glabrata NOT DETECTED NOT DETECTED Final   Candida krusei NOT DETECTED NOT DETECTED Final   Candida parapsilosis NOT DETECTED NOT DETECTED Final   Candida tropicalis NOT DETECTED NOT DETECTED Final   Cryptococcus neoformans/gattii NOT DETECTED NOT DETECTED Final    Comment: Performed at [facility], [place] [address], [place], [place] [address]     Medications:    ALPRAZolam  0.5 mg Oral TID   atorvastatin  40 mg Oral Daily   feeding supplement  237 mL Oral BID BM   folic acid  1 mg Oral Daily   lidocaine  1 patch Transdermal QHS   multivitamin with minerals  1 tablet Oral Daily   nystatin  5 mL Mouth/Throat QID   polyethylene glycol  17 g Oral Daily   thiamine  100 mg Oral Daily   Or   thiamine  100 mg Intravenous Daily   Continuous Infusions:  sodium chloride 20 mL/hr at 08/28/20 0343   cefTRIAXone (ROCEPHIN)  IV 2 g (08/28/20 0344)   vancomycin 750 mg (08/28/20 0514)      LOS: 8 days   Charlynne Cousins  [facility]  08/28/2020, 7:14 AM

## 2020-08-28 NOTE — Progress Notes (Signed)
Daily Progress Note   Patient Name: Cody Douglas       Date: 08/28/2020 DOB: [birth year]  Age: 70 y.o. MRN#: [id number] Attending Physician: Charlynne Cousins, MD Primary Care Physician: Tamsen Roers, MD Admit Date: 08/20/2020  Reason for Consultation/Follow-up: Establishing goals of care, Non pain symptom management, Pain control, and Psychosocial/spiritual support     Chart Reviewed. Patient Examined.   Subjective: [name] is awake and alert, resting in bed. Patient is accompanied by family at the bedside including daughter, Cody Douglas. Palliative care was addressed to follow-up on questions and concerns regarding symptom management, comfort care, and Long-term care with hospice.   Patient reports he has been having some back pain and muscle spasms in his lower back today.  Getting out of bed helps these.  Flexeril and oxycodone helped these at home. Daughter at bedside reports he has not been receiving the same amount of pain medication he would normally take at home. Patient reports he "used to drink Vodka" and "BC Powders" to help with his symptoms and is aggreeble to taking hospital scheduled medications instead to aid in symptom management. Palliative addressed that symptom management with comfort care will address any pain the patient is experiencing accordingly, ensuring the patient is kept comfortable. Cody Douglas is under the impression that hospice is only where "they leave you to die." The difference between hospice facility and LTC with Hospice was discussed and questions were answered. Patient and family agrees that Long-term care with hospice is the best decision.   Patient denies any nausea or vomiting, constipation, or anxiety. Patient reports he has had some difficulty  sleeping at night Family reports patient has had bouts of depression and discussing his next steps in care visibly upsets Cody Douglas. Anxiety and depression management was discussed and agreed upon with family at bedside.   MOST form completed at bedside.   The patient and family outlined their wishes for the following treatment decisions:  Cardiopulmonary Resuscitation: Do Not Attempt Resuscitation (DNR/No CPR)  Medical Interventions: Comfort Measures: Keep clean, warm, and dry. Use medication by any route, positioning, wound care, and other measures to relieve pain and suffering. Use oxygen, suction and manual treatment of airway obstruction as needed for comfort. Do not transfer to the hospital unless comfort needs cannot be met in current location.  Antibiotics: Determine use of limitation of antibiotics when infection occurs  IV Fluids: No IV fluids (provide other measures to ensure comfort)  Feeding Tube: No feeding tube     Assessment: Patient is awake & alert, resting in bed. No acute distress.  Plan is to await medicaid bed at LTC facility with Hospice.  Plan is to continue Lovenox while at [employer].   Patient Profile/HPI: 70 y.o. male  with past medical history of  HTN, prostate CA, smoking, EtOH abuse, ischemic stroke in April 2022, and stg 3 NSCLC on chemo and radiation admitted on 08/20/2020 with  L arm weakness and L facial droop / dyarthria. Diagnosed with an acute ischemic stroke. 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 He is not a candidate for surgical intervention. PMT consulted to discuss [place].   Length of Stay: 8   Vital Signs: BP 114/77   Pulse 99   Temp 98.1 F (36.7 C) (Oral)   Resp 16   Ht 5' 11" (1.803 m)   Wt 64.5 kg   SpO2 97%   BMI 19.82 kg/m  SpO2: SpO2: 97 % O2 Device: O2 Device: Room Air O2 Flow Rate: O2 Flow Rate (L/min): 2 L/min       Palliative  Assessment/Data: PPS 50%     Palliative Care Plan    Recommendations/Plan: Full Comfort Care  Transfer patient to [place] for rest of hospital stay Awaiting bed at LTC facility with Hospice - continue Lovenox. NO REHAB.  No further Oncologic interventions. Discussed with [facility] about hospice covering Lovenox - [facility] is willing to make an exception and support patient with Lovenox at [place].  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  No further Oncologic interventions.  = High risk for acute decline or death from thrombotic event.  Discharge Planning: [place] with Hospice  Care plan was discussed with Patient and Family at bedside including daughter Cody Douglas), Ronalee Belts and [place].   Thank you for allowing the Palliative Medicine Team to assist in the care of this patient.  Total time spent: 45 mins     Greater than 50%  of this time was spent counseling and coordinating care related to the above assessment and plan.  Cody Jenny, PA-C Palliative Medicine Cody Isaacs, PA-S2  Please contact Palliative MedicineTeam phone at [phone] for questions and concerns between 7 am - 7 pm.   Please see AMION for individual provider pager numbers.

## 2020-08-28 NOTE — Progress Notes (Signed)
ANTICOAGULATION CONSULT NOTE - Follow Up Consult  Pharmacy Consult for Heparin >> Lovenox Indication:  AV thrombus and recurrent CVA  No Known Allergies  Patient Measurements: Height: 5\' 11"  (180.3 cm) Weight: 64.5 kg (142 lb 1.6 oz) IBW/kg (Calculated) : 75.3 Heparin Dosing Weight: 64.5 kg  Vital Signs: Temp: 98.2 F (36.8 C) (06/18 0323) Temp Source: Oral (06/18 0323) BP: 122/74 (06/18 0323) Pulse Rate: 85 (06/18 0323)  Labs: Recent Labs    08/25/20 2017 08/26/20 0443 08/27/20 0415  HGB  --  11.3* 10.7*  HCT  --  35.3* 34.0*  PLT  --  246 240  HEPARINUNFRC 0.24* 0.37 0.26*     Estimated Creatinine Clearance: 77.3 mL/min (A) (by C-G formula based on SCr of 0.57 mg/dL (L)).   Medications:  Scheduled:   ALPRAZolam  0.5 mg Oral TID   atorvastatin  40 mg Oral Daily   feeding supplement  237 mL Oral BID BM   lidocaine  1 patch Transdermal QHS   polyethylene glycol  17 g Oral Daily   Infusions:   sodium chloride 20 mL/hr at 08/28/20 0343    Assessment: 70 yo M admitted with L arm weakness and L facial droop / dyarthria.  Recent CVA April 2022 with similar symptoms, now worse.  TEE showed mobile mass and pt was started on IV heparin.  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 Recommending transition from Heparin to Lovenox for management.   Goal of Therapy:  Heparin level 0.3-0.5 units/ml Monitor platelets by anticoagulation protocol: Yes   Plan:  - D/c Heparin - Start Lovenox 60 mg SQ every 12 hours (rounded to syringe size) - Will monitor renal function and weight for any necessary dose adjustments  Thank you for allowing pharmacy to be a part of this patient's care.  Alycia Rossetti, PharmD, BCPS Clinical Pharmacist 08/28/2020 7:39 AM   **Pharmacist phone directory can now be found on [email or web address] ([email or web address]).  Listed under [place].

## 2020-08-28 NOTE — Progress Notes (Signed)
[facility] 5W86  [facility]) RN Hospital Liaison Note:  Received request from Coralee Pesa, [facility] manager for hospice services at skilled nursing facility after discharge. Hospice eligibility pending at this time.  Attempted to visit patient and he was asleep. Phone call to daughter, Shelbie Proctor, to initiate education related to hospice philosophy. Hinton Dyer states she is unable to speak about services at this time and would like someone from [facility] to contact her tomorrow.  Please do not hesitate to call for any hospice related questions.   Thank you for the opportunity to participate in this patient's care.  Bobbie "Loren Racer, RN, BSN [facility] Liaison [phone]

## 2020-08-29 DIAGNOSIS — I058 Other rheumatic mitral valve diseases: Secondary | ICD-10-CM | POA: Diagnosis not present

## 2020-08-29 DIAGNOSIS — Z7289 Other problems related to lifestyle: Secondary | ICD-10-CM

## 2020-08-29 DIAGNOSIS — Z66 Do not resuscitate: Secondary | ICD-10-CM

## 2020-08-29 DIAGNOSIS — I639 Cerebral infarction, unspecified: Secondary | ICD-10-CM | POA: Diagnosis not present

## 2020-08-29 DIAGNOSIS — C3411 Malignant neoplasm of upper lobe, right bronchus or lung: Secondary | ICD-10-CM | POA: Diagnosis not present

## 2020-08-29 DIAGNOSIS — I10 Essential (primary) hypertension: Secondary | ICD-10-CM | POA: Diagnosis not present

## 2020-08-29 LAB — CULTURE, BLOOD (ROUTINE X 2)
Culture: NO GROWTH
Special Requests: ADEQUATE

## 2020-08-29 MED ORDER — OXYCODONE-ACETAMINOPHEN 7.5-325 MG PO TABS
1.0000 | ORAL_TABLET | Freq: Three times a day (TID) | ORAL | Status: DC
  Administered 2020-08-29 – 2020-08-30 (×3): 1 via ORAL
  Filled 2020-08-29 (×3): qty 1

## 2020-08-29 NOTE — TOC Progression Note (Signed)
Transition of Care [facility]) - Progression Note    Patient Details  Name: Cody Douglas MRN: [id number] Date of Birth: 1951-03-09  Transition of Care [facility]) CM/SW [place], [place] Phone Number: 08/29/2020, 4:23 PM  Clinical Narrative:    CSW spoke with weekday social worker who advised CSW that the plan is still to DC to [facility] and pt can go even though auth was not acquired. CSW let daughter know and she is agreeable to this plan. CSW updated MD and hospice and advised them a new covid would be needed prior to DC. CSW was unable to confirm with facility hen they could accept pt back, will need to follow up tomorrow. SW will continue to follow for DC needs.   Expected Discharge Plan: [place] Barriers to Discharge: Continued Medical Work up, Ship broker  Expected Discharge Plan and Services Expected Discharge Plan: [place] Choice: [place] arrangements for the past 2 months: [place]                                       Social Determinants of Health (SDOH) Interventions    Readmission Risk Interventions No flowsheet data found.

## 2020-08-29 NOTE — Progress Notes (Signed)
Daily Progress Note   Patient Name: Cody Douglas       Date: 08/29/2020 DOB: Aug 24, 1950  Age: 70 y.o. MRN#: [id number] Attending Physician: Charlynne Cousins, MD Primary Care Physician: Tamsen Roers, MD Admit Date: 08/20/2020  Reason for Consultation/Follow-up: symptom management, social support  Chart reviewed.  No overnight events noted.   Subjective: Met with daughter, brother, mother at bedside.  Patient is comfortable but slightly confused.  Family appreciative that he is comfortable.   The scheduled medications were not quite relieving his pain so he requested a dose of IV morphine which has lead to his confused state.  Family reports he had 5 bowel movements yesterday so miralax is now being held.   Eating small amounts. Mother expressed frustration that she should be doing something for him.  She also expressed her gratitude that everyone is working together perfectly to care for her son.  Family appreciative.  Family and [facility] will meet sometime today to arrange services thru [facility].     Family requests a move to [place] when possible.   Assessment: Patient comfortable and slightly confused after morphine IV.   Patient Profile/HPI:  70 y.o. male  with past medical history of  HTN, prostate CA, smoking, EtOH abuse, ischemic stroke in April 2022, and stg 3 NSCLC on chemo and radiation admitted on 08/20/2020 with  L arm weakness and L facial droop / dyarthria. Diagnosed with an acute ischemic stroke. 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 He is not a candidate for surgical intervention. PMT consulted to discuss [place].   Length of Stay: 9   Vital Signs: BP 111/72 (BP Location: Left Arm)   Pulse  90   Temp 98.2 F (36.8 C) (Oral)   Resp 14   Ht $R'5\' 11"'Sn$  (1.803 m)   Wt 64.5 kg   SpO2 95%   BMI 19.82 kg/m  SpO2: SpO2: 95 % O2 Device: O2 Device: Room Air O2 Flow Rate: O2 Flow Rate (L/min): 2 L/min       Palliative Assessment/Data: 30%     Palliative Care Plan    Recommendations/Plan: Comfort measures only. Continue current meds Muscle spasm  - flexeril 5 mg TID scheduled. Pain - Percocet increased to 7.5 mg TID  scheduled.(in an attempt to reduce the need for IV morphine) Pain - Oxy PRN for break thru Anxiety (previous alcohol dependence) - Xanax 0.5 mg TID scheduled Discomfort not otherwise relieved - Morphine IV PRN.  Code Status:  DNR  Prognosis: weeks to months with a high likelihood of acute decline     Discharge Planning: Long term care with hospice.  Care plan was discussed with family at bedside.  Thank you for allowing the Palliative Medicine Team to assist in the care of this patient.  Total time spent:  35 min.     Greater than 50%  of this time was spent counseling and coordinating care related to the above assessment and plan.  Florentina Jenny, PA-C Palliative Medicine  Please contact Palliative MedicineTeam phone at [phone] for questions and concerns between 7 am - 7 pm.   Please see AMION for individual provider pager numbers.

## 2020-08-29 NOTE — Progress Notes (Signed)
[employer] [facility]) Hospital Liaison: RN note     Chart and patient information under review by [facility] physician. Hospice eligibility pending currently.     Writer spoke with pt's daughter Hinton Dyer by phone to initiate education related to hospice philosophy, services and team approach to care. Hinton Dyer verbalized understanding of information given. Per discussion, discharge pending placement at a long term care facility.  Please send signed and completed DNR form home with patient/family.   Please provide prescriptions for mediations at discharge to ensure comfort until patient can be admitted onto hospice services.   Discussion of DME deferred until LTC facility has been determined.  ACC information and contact numbers given to [facility].       Please do not hesitate to call with questions.   Thank you for the opportunity to participate in this patient's care.  Domenic Moras, BSN, RN       [place] (listed on AMION under [phone]  [phone] (24h on call)

## 2020-08-29 NOTE — Progress Notes (Signed)
[facility] PROGRESS NOTE    Progress Note  Cody Douglas  FKC:[id number] DOB: Jan 12, 1951 DOA: 08/20/2020 PCP: Tamsen Roers, MD     Brief Narrative:   Cody Douglas is an 70 y.o. male past medical history of non-small cell cancer on chemo and radiation therapy, essential hypertension, also prostate cancer comes in from [facility] for left-sided weakness, he was recently discharged from the hospital 07/15/2020 for acute right-sided CVA with left-sided weakness transferred to skilled rehab.  Who comes back in for new CVA to the right basal ganglia parietal lobe matter right frontal and occipital lobes.  Awaiting LTC with hospice care placement.  Assessment/Plan:   Acute ischemic stroke [facility]): MRI of the brain showed patchy multiple infarcts to the right basal ganglia, parietal white matter right frontal and occipital cortices. 2D echo showed a mobile mass in the aortic area probably not infected bacterial endocarditis. Transition to Lovenox subcutaneously. Patient was made a DNR/DNI he would like to have no further chemotherapy or radiation.   Patient would like to be full comfort care, transfer to 6 N. Awaiting LTC facility with hospice will continue Lovenox twice daily.  He would like no rehab is hospice support. Do not escalate care.  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 Transition to hospice care he will like to continue anticoagulation. Continue subcutaneous Lovenox. Discontinue antibiotics.  Cognitive assessment: Will need evaluation as an outpatient.  Stage III squamous cell lung cancer: Currently on chemo and radiation he was scheduled for treatment on 08/23/2020 which has been postponed. For radiation therapy today.  Essential hypertension/grade 2 diastolic heart failure: He is currently on metoprolol will need to allow  permissive hypertension.  Oral thrush: Likely related to chemotherapy now resolved stop nystatin.  Alcohol abuse: Treated with thiamine and folate.  Goals of care/ethics: Palliative  Care met with patient and family and after explaining his prognosis they decided to move towards full comfort care. He is not a candidate for residential hospice at this point in time, he would likely go to LTC facility with hospice, he does not want any antibiotic and 1 to minimize medications that did not have to do with comfort. Will continue oxycodone and Lovenox.   DVT prophylaxis: lovenox Family Communication:duaghter Status is: Inpatient  Remains inpatient appropriate because:Hemodynamically unstable  Dispo: The patient is from: SNF              Anticipated d/c is to: SNF              Patient currently is not medically stable to d/c.   Difficult to place patient No     Code Status:     Code Status Orders  (From admission, onward)           Start     Ordered   08/20/20 2123  Do not attempt resuscitation (DNR)  Continuous       Question Answer Comment  In the event of cardiac or respiratory ARREST Do not call a "code blue"   In the event of cardiac or respiratory ARREST Do not perform Intubation, CPR, defibrillation or ACLS   In the event of cardiac or respiratory ARREST Use medication by any route, position, wound care, and other measures to relive pain and suffering. May use oxygen, suction and manual treatment of airway obstruction as needed for comfort.   Comments yellow form at bedside  08/20/20 2122           Code Status History     Date Active Date Inactive Code Status Order ID Comments User Context   08/20/2020 2039 08/20/2020 2122 Full Code [id number]  Etta Quill, DO ED   06/30/2020 2219 07/15/2020 1956 Full Code [id number]  Marcelyn Bruins, MD ED   12/13/2013 0223 12/14/2013 1627 Full Code [id number]  Toy Baker, MD Inpatient   12/13/2013 0113  12/13/2013 0223 Full Code [id number]  Toy Baker, MD Inpatient      Advance Directive Documentation    Flowsheet Row Most Recent Value  Type of Advance Directive Out of facility DNR (pink MOST or yellow form)  Pre-existing out of facility DNR order (yellow form or pink MOST form) Pink MOST/Yellow Form most recent copy in chart - Physician notified to receive inpatient order  "MOST" Form in Place? --         IV Access:   Peripheral IV   Procedures and diagnostic studies:   No results found.   Medical Consultants:   None.   Subjective:    Cody Douglas relates his pain is the same as yesterday, tolerating his diet.  Objective:    Vitals:   08/28/20 0800 08/28/20 1129 08/28/20 2015 08/29/20 0718  BP: 124/71 114/77 118/67 111/72  Pulse: 90 99 92 90  Resp: _0 Temp:  98.1 F (36.7 C) 97.6 F (36.4 C) 98.2 F (36.8 C)  TempSrc:  Oral Oral Oral  SpO2: 99% 97% 99% 95%  Weight:      Height:       SpO2: 95 % O2 Flow Rate (L/min): 2 L/min   Intake/Output Summary (Last 24 hours) at 08/29/2020 0848 Last data filed at 08/29/2020 0600 Gross per 24 hour  Intake --  Output 1625 ml  Net -1625 ml    Filed Weights   08/20/20 1859  Weight: 64.5 kg    Exam: General exam: In no acute distress. Respiratory system: Good air movement and clear to auscultation. Cardiovascular system: S1 & S2 heard, RRR. No JVD.  Extremities: No pedal edema. Skin: No rashes, lesions or ulcers  Data Reviewed:    Labs: Basic Metabolic Panel: Recent Labs  Lab 08/23/20 0337 08/28/20 0706  NA 138 134*  K 4.1 3.9  CL 105 103  CO2 28 22  GLUCOSE 119* 109*  BUN 13 8  CREATININE 0.57* 0.43*  CALCIUM 8.8* 8.8*  MG 1.9  --     GFR Estimated Creatinine Clearance: 77.3 mL/min (A) (by C-G formula based on SCr of 0.43 mg/dL (L)). Liver Function Tests: Recent Labs  Lab 08/23/20 0337  AST 14*  ALT 12  ALKPHOS 86  BILITOT 0.4  PROT 5.7*  ALBUMIN 2.8*     No results for input(s): LIPASE, AMYLASE in the last 168 hours. No results for input(s): AMMONIA in the last 168 hours. Coagulation profile No results for input(s): INR, PROTIME in the last 168 hours.  COVID-19 Labs  No results for input(s): DDIMER, FERRITIN, LDH, CRP in the last 72 hours.  Lab Results  Component Value Date   SARSCOV2NAA NEGATIVE 08/23/2020   SARSCOV2NAA NEGATIVE 08/20/2020   SARSCOV2NAA NEGATIVE 07/15/2020   [place] NEGATIVE 07/12/2020    CBC: Recent Labs  Lab 08/25/20 0324 08/26/20 0443 08/27/20 0415 08/28/20 0706  WBC 4.6 4.4 5.0 5.8  HGB 10.8* 11.3* 10.7* 10.9*  HCT 33.2* 35.3* 34.0* 33.5*  MCV 92.0 91.7 91.9 89.6  PLT 283  246 240 230    Cardiac Enzymes: No results for input(s): CKTOTAL, CKMB, CKMBINDEX, TROPONINI in the last 168 hours. BNP (last 3 results) No results for input(s): PROBNP in the last 8760 hours. CBG: Recent Labs  Lab 08/23/20 2001  GLUCAP 113*    D-Dimer: No results for input(s): DDIMER in the last 72 hours. Hgb A1c: No results for input(s): HGBA1C in the last 72 hours. Lipid Profile: No results for input(s): CHOL, HDL, LDLCALC, TRIG, CHOLHDL, LDLDIRECT in the last 72 hours. Thyroid function studies: No results for input(s): TSH, T4TOTAL, T3FREE, THYROIDAB in the last 72 hours.  Invalid input(s): FREET3 Anemia work up: No results for input(s): VITAMINB12, FOLATE, FERRITIN, TIBC, IRON, RETICCTPCT in the last 72 hours. Sepsis Labs: Recent Labs  Lab 08/25/20 0324 08/26/20 0443 08/27/20 0415 08/28/20 0706  WBC 4.6 4.4 5.0 5.8    Microbiology Recent Results (from the past 240 hour(s))  SARS CORONAVIRUS 2 (TAT 6-24 HRS) Nasopharyngeal Nasopharyngeal Swab     Status: None   Collection Time: 08/20/20  9:36 PM   Specimen: Nasopharyngeal Swab  Result Value Ref Range Status   SARS Coronavirus 2 NEGATIVE NEGATIVE Final    Comment: (NOTE) SARS-CoV-2 target nucleic acids are NOT DETECTED.  The SARS-CoV-2 RNA  is generally detectable in upper and lower respiratory specimens during the acute phase of infection. Negative results do not preclude SARS-CoV-2 infection, do not rule out co-infections with other pathogens, and should not be used as the sole basis for treatment or other patient management decisions. Negative results must be combined with clinical observations, patient history, and epidemiological information. The expected result is Negative.  Fact Sheet for Patients: SugarRoll.be  Fact Sheet for Healthcare Providers: https://www.woods-mathews.com/  This test is not yet approved or cleared by the [place] FDA and  has been authorized for detection and/or diagnosis of SARS-CoV-2 by FDA under an Emergency Use Authorization (EUA). This EUA will remain  in effect (meaning this test can be used) for the duration of the COVID-19 declaration under Se ction 564(b)(1) of the Act, 21 U.S.C. section 360bbb-3(b)(1), unless the authorization is terminated or revoked sooner.  Performed at [facility], [place] [address], [place], [place] [address]   Resp Panel by RT-PCR (Flu A&B, Covid) Nasopharyngeal Swab     Status: None   Collection Time: 08/23/20 12:06 PM   Specimen: Nasopharyngeal Swab; Nasopharyngeal(NP) swabs in vial transport medium  Result Value Ref Range Status   SARS Coronavirus 2 by RT PCR NEGATIVE NEGATIVE Final    Comment: (NOTE) SARS-CoV-2 target nucleic acids are NOT DETECTED.  The SARS-CoV-2 RNA is generally detectable in upper respiratory specimens during the acute phase of infection. The lowest concentration of SARS-CoV-2 viral copies this assay can detect is 138 copies/mL. A negative result does not preclude SARS-Cov-2 infection and should not be used as the sole basis for treatment or other patient management decisions. A negative result may occur with  improper specimen collection/handling, submission of specimen  other than nasopharyngeal swab, presence of viral mutation(s) within the areas targeted by this assay, and inadequate number of viral copies(<138 copies/mL). A negative result must be combined with clinical observations, patient history, and epidemiological information. The expected result is Negative.  Fact Sheet for Patients:  EntrepreneurPulse.com.au  Fact Sheet for Healthcare Providers:  IncredibleEmployment.be  This test is no t yet approved or cleared by the [place] FDA and  has been authorized for detection and/or diagnosis of SARS-CoV-2 by FDA under an Emergency  Use Authorization (EUA). This EUA will remain  in effect (meaning this test can be used) for the duration of the COVID-19 declaration under Section 564(b)(1) of the Act, 21 U.S.C.section 360bbb-3(b)(1), unless the authorization is terminated  or revoked sooner.       Influenza A by PCR NEGATIVE NEGATIVE Final   Influenza B by PCR NEGATIVE NEGATIVE Final    Comment: (NOTE) The Xpert Xpress SARS-CoV-2/FLU/RSV plus assay is intended as an aid in the diagnosis of influenza from Nasopharyngeal swab specimens and should not be used as a sole basis for treatment. Nasal washings and aspirates are unacceptable for Xpert Xpress SARS-CoV-2/FLU/RSV testing.  Fact Sheet for Patients: EntrepreneurPulse.com.au  Fact Sheet for Healthcare Providers: IncredibleEmployment.be  This test is not yet approved or cleared by the [place] FDA and has been authorized for detection and/or diagnosis of SARS-CoV-2 by FDA under an Emergency Use Authorization (EUA). This EUA will remain in effect (meaning this test can be used) for the duration of the COVID-19 declaration under Section 564(b)(1) of the Act, 21 U.S.C. section 360bbb-3(b)(1), unless the authorization is terminated or revoked.  Performed at [facility], [place] [address], [place],  [place] [address]   Culture, blood (routine x 2)     Status: None (Preliminary result)   Collection Time: 08/24/20  6:00 PM   Specimen: BLOOD  Result Value Ref Range Status   Specimen Description BLOOD RIGHT ANTECUBITAL  Final   Special Requests   Final    BOTTLES DRAWN AEROBIC AND ANAEROBIC Blood Culture adequate volume   Culture   Final    NO GROWTH 4 DAYS Performed at [facility], [place] [address] [place], [place] [address]    Report Status PENDING  Incomplete  Culture, blood (routine x 2)     Status: Abnormal   Collection Time: 08/24/20  6:05 PM   Specimen: BLOOD  Result Value Ref Range Status   Specimen Description BLOOD BLOOD RIGHT FOREARM  Final   Special Requests   Final    BOTTLES DRAWN AEROBIC AND ANAEROBIC Blood Culture results may not be optimal due to an inadequate volume of blood received in culture bottles   Culture  Setup Time   Final    GRAM POSITIVE COCCI IN CLUSTERS IN BOTH AEROBIC AND ANAEROBIC BOTTLES CRITICAL RESULT CALLED TO, READ BACK BY AND VERIFIED WITH: J. LEDFORD PHARMD, AT 4585 08/25/20 D. VANHOOK    Culture (A)  Final    AEROCOCCUS URINAE THE SIGNIFICANCE OF ISOLATING THIS ORGANISM FROM A SINGLE SET OF BLOOD CULTURES WHEN MULTIPLE SETS ARE DRAWN IS UNCERTAIN. PLEASE NOTIFY THE MICROBIOLOGY DEPARTMENT WITHIN ONE WEEK IF SPECIATION AND SENSITIVITIES ARE REQUIRED. Performed at [facility], [place] [address] [place], [place] [address]    Report Status 08/27/2020 FINAL  Final  Blood Culture ID Panel (Reflexed)     Status: None   Collection Time: 08/24/20  6:05 PM  Result Value Ref Range Status   Enterococcus faecalis NOT DETECTED NOT DETECTED Final   Enterococcus Faecium NOT DETECTED NOT DETECTED Final   Listeria monocytogenes NOT DETECTED NOT DETECTED Final   Staphylococcus species NOT DETECTED NOT DETECTED Final   Staphylococcus aureus (BCID) NOT DETECTED NOT DETECTED Final   Staphylococcus epidermidis NOT DETECTED NOT DETECTED Final    Staphylococcus lugdunensis NOT DETECTED NOT DETECTED Final   Streptococcus species NOT DETECTED NOT DETECTED Final   Streptococcus agalactiae NOT DETECTED NOT DETECTED Final   Streptococcus pneumoniae NOT DETECTED NOT DETECTED  Final   Streptococcus pyogenes NOT DETECTED NOT DETECTED Final   A.calcoaceticus-baumannii NOT DETECTED NOT DETECTED Final   Bacteroides fragilis NOT DETECTED NOT DETECTED Final   Enterobacterales NOT DETECTED NOT DETECTED Final   Enterobacter cloacae complex NOT DETECTED NOT DETECTED Final   Escherichia coli NOT DETECTED NOT DETECTED Final   Klebsiella aerogenes NOT DETECTED NOT DETECTED Final   Klebsiella oxytoca NOT DETECTED NOT DETECTED Final   Klebsiella pneumoniae NOT DETECTED NOT DETECTED Final   Proteus species NOT DETECTED NOT DETECTED Final   Salmonella species NOT DETECTED NOT DETECTED Final   Serratia marcescens NOT DETECTED NOT DETECTED Final   Haemophilus influenzae NOT DETECTED NOT DETECTED Final   Neisseria meningitidis NOT DETECTED NOT DETECTED Final   Pseudomonas aeruginosa NOT DETECTED NOT DETECTED Final   Stenotrophomonas maltophilia NOT DETECTED NOT DETECTED Final   Candida albicans NOT DETECTED NOT DETECTED Final   Candida auris NOT DETECTED NOT DETECTED Final   Candida glabrata NOT DETECTED NOT DETECTED Final   Candida krusei NOT DETECTED NOT DETECTED Final   Candida parapsilosis NOT DETECTED NOT DETECTED Final   Candida tropicalis NOT DETECTED NOT DETECTED Final   Cryptococcus neoformans/gattii NOT DETECTED NOT DETECTED Final    Comment: Performed at [facility], [place] [address], [place], [place] [address]     Medications:    ALPRAZolam  0.5 mg Oral TID   cyclobenzaprine  5 mg Oral Q8H   enoxaparin (LOVENOX) injection  60 mg Subcutaneous Q12H   lidocaine  1 patch Transdermal QHS   oxyCODONE-acetaminophen  1 tablet Oral Q8H   polyethylene glycol  17 g Oral Daily   senna  1 tablet Oral QHS   sodium chloride flush  3 mL  Intravenous Q12H   traZODone  50 mg Oral QHS   Continuous Infusions:  sodium chloride        LOS: 9 days   Charlynne Cousins  [facility]  08/29/2020, 8:48 AM

## 2020-08-30 ENCOUNTER — Telehealth: Payer: Self-pay | Admitting: Internal Medicine

## 2020-08-30 ENCOUNTER — Ambulatory Visit: Payer: Medicare Other

## 2020-08-30 ENCOUNTER — Other Ambulatory Visit: Payer: Medicare Other

## 2020-08-30 ENCOUNTER — Encounter: Payer: Self-pay | Admitting: Radiation Oncology

## 2020-08-30 ENCOUNTER — Ambulatory Visit: Payer: Medicare Other | Admitting: Internal Medicine

## 2020-08-30 DIAGNOSIS — I10 Essential (primary) hypertension: Secondary | ICD-10-CM | POA: Diagnosis not present

## 2020-08-30 DIAGNOSIS — I058 Other rheumatic mitral valve diseases: Secondary | ICD-10-CM | POA: Diagnosis not present

## 2020-08-30 DIAGNOSIS — B370 Candidal stomatitis: Secondary | ICD-10-CM | POA: Diagnosis not present

## 2020-08-30 DIAGNOSIS — C3411 Malignant neoplasm of upper lobe, right bronchus or lung: Secondary | ICD-10-CM | POA: Diagnosis not present

## 2020-08-30 LAB — RESP PANEL BY RT-PCR (FLU A&B, COVID) ARPGX2
Influenza A by PCR: NEGATIVE
Influenza B by PCR: NEGATIVE
SARS Coronavirus 2 by RT PCR: NEGATIVE

## 2020-08-30 MED ORDER — OXYCODONE HCL 5 MG PO TABS
10.0000 mg | ORAL_TABLET | ORAL | Status: DC | PRN
  Administered 2020-08-30 (×2): 10 mg via ORAL
  Filled 2020-08-30 (×2): qty 2

## 2020-08-30 MED ORDER — BISACODYL 5 MG PO TBEC
5.0000 mg | DELAYED_RELEASE_TABLET | Freq: Every day | ORAL | 0 refills | Status: AC | PRN
Start: 2020-08-30 — End: ?

## 2020-08-30 MED ORDER — GLYCOPYRROLATE 1 MG PO TABS: 1.0000 mg | ORAL_TABLET | Freq: Three times a day (TID) | ORAL | 0 refills | Status: AC

## 2020-08-30 MED ORDER — CYCLOBENZAPRINE HCL 10 MG PO TABS: 5.0000 mg | ORAL_TABLET | Freq: Three times a day (TID) | ORAL | 0 refills | Status: AC

## 2020-08-30 MED ORDER — OXYCODONE HCL 10 MG PO TABS: 10.0000 mg | ORAL_TABLET | ORAL | 0 refills | Status: AC | PRN

## 2020-08-30 MED ORDER — ENOXAPARIN SODIUM 60 MG/0.6ML IJ SOSY
60.0000 mg | PREFILLED_SYRINGE | Freq: Two times a day (BID) | INTRAMUSCULAR | 0 refills | Status: AC
Start: 2020-08-30 — End: ?

## 2020-08-30 MED ORDER — POLYETHYLENE GLYCOL 3350 17 G PO PACK: 17.0000 g | PACK | Freq: Every day | ORAL | 0 refills | Status: AC

## 2020-08-30 MED ORDER — TRAZODONE HCL 50 MG PO TABS: 50.0000 mg | ORAL_TABLET | Freq: Every day | ORAL | 0 refills | Status: AC

## 2020-08-30 MED ORDER — ALPRAZOLAM 0.5 MG PO TABS
0.5000 mg | ORAL_TABLET | Freq: Three times a day (TID) | ORAL | 0 refills | Status: AC
Start: 2020-08-30 — End: ?

## 2020-08-30 NOTE — Progress Notes (Signed)
Called report to nurse Caryl Pina at [facility].

## 2020-08-30 NOTE — Telephone Encounter (Signed)
Attempted to contact pt again about r/s appt. No answer and no vm available.

## 2020-08-30 NOTE — TOC Transition Note (Signed)
Transition of Care [facility]) - CM/SW Discharge Note   Patient Details  Name: ZYRION COEY MRN: [id number] Date of Birth: 04-20-1950  [facility] of [facility]) CM/SW Contact:  Geralynn Ochs, LCSW Phone Number: 08/30/2020, 2:49 PM   Clinical Narrative:   Nurse to call report to [phone].  Nurse to call the daughter, Hinton Dyer [phone], when Corey Harold arrives.    Final next level of care: Skilled Nursing Facility Barriers to Discharge: Barriers Resolved   Patient Goals and CMS Choice Patient states their goals for this hospitalization and ongoing recovery are:: Pt agreeable to returning to [facility]. CMS Medicare.gov Compare Post Acute Care list provided to:: Patient Choice offered to / list presented to : Patient  Discharge Placement              Patient chooses bed at: [facility] Patient to be transferred to facility by: [facility] Name of family member notified: Hinton Dyer Patient and family notified of of transfer: 08/30/20  Discharge Plan and Services     Post Acute Care Choice: [facility]                               Social Determinants of Health (SDOH) Interventions     Readmission Risk Interventions No flowsheet data found.

## 2020-08-30 NOTE — Progress Notes (Signed)
Our department noted the patient will be discharging to a facility with hospice care. We will discontinue radiotherapy given this plan.

## 2020-08-30 NOTE — Progress Notes (Signed)
Pt wheeled off unit by [facility]. IV removed all pt belongings left out with him.

## 2020-08-30 NOTE — Discharge Summary (Signed)
Physician Discharge Summary  ARMONIE STATEN ZOX:[id number] DOB: 1951-01-13 DOA: 08/20/2020  PCP: Tamsen Roers, MD  Admit date: 08/20/2020 Discharge date: 08/30/2020  Admitted From: SNF Disposition:  LTC with Hospice  Recommendations for Outpatient Follow-up:  Follow up with PCP in 1-2 weeks Please obtain BMP/CBC in one week   Home Health:no Equipment/Devices:none  Discharge Condition:SHospice (Stable, guarded, hospice) CODE STATUS:DNR Diet recommendation: Heart Healthy   Brief/Interim Summary: 70 y.o. male past medical history of non-small cell cancer on chemo and radiation therapy, essential hypertension, also prostate cancer comes in from [facility] for left-sided weakness, he was recently discharged from the hospital 07/15/2020 for acute right-sided CVA with left-sided weakness transferred to skilled rehab.  Who comes back in for new CVA to the right basal ganglia parietal lobe matter right frontal and occipital lobes.  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 2D echo showed a mobile mass in the aortic area probably not infected bacterial endocarditis. He was started on IV heparin and then transition to subcutaneous Lovenox. Patient was made a DNR/DNI he would like to have no further chemotherapy or radiation.   Patient would like to be full comfort care. He would like no rehab is hospice support. Do not escalate care. Will do Lovenox twice a day.  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 Transition to hospice care he will like to continue anticoagulation.  Cognitive assessment: Will need evaluation as an outpatient.  Stage III squamous cell lung cancer: Currently on chemo and radiation he was scheduled for treatment on 08/23/2020 which has been postponed. For radiation therapy today.  Essential hypertension/grade 2 diastolic heart failure: He is currently on metoprolol will need to allow permissive hypertension.   Oral thrush: Likely related to chemotherapy now resolved stop nystatin.  Alcohol abuse: Treated with thiamine and folate.   Goals of care/ethics: Palliative  Care met with patient and family and after explaining his prognosis they decided to move towards full comfort care. He is not a candidate for residential hospice at this point in time, he would likely go to LTC facility with hospice. Will continue oxycodone and Lovenox.  Discharge Instructions  Discharge Instructions     Diet - low sodium heart healthy   Complete by: As directed    Diet - low sodium heart healthy   Complete by: As directed    Increase activity slowly   Complete by: As directed    Increase activity slowly   Complete by: As directed       Allergies as of 08/30/2020   No Known Allergies      Medication List     STOP taking these medications    albuterol 108 (90 Base) MCG/ACT inhaler Commonly known as: VENTOLIN HFA   aspirin 81 MG chewable tablet   atorvastatin 40 MG tablet Commonly known as: LIPITOR   fluconazole 100 MG tablet Commonly known as: DIFLUCAN   fluconazole 200 MG tablet Commonly known as: DIFLUCAN   folic acid 1  MG tablet Commonly known as: FOLVITE   metoprolol succinate 25 MG 24 hr tablet Commonly known as: TOPROL-XL   multivitamin with minerals tablet   omeprazole 20 MG capsule Commonly known as: PRILOSEC   oxyCODONE-acetaminophen 10-325 MG tablet Commonly known as: PERCOCET   prochlorperazine  10 MG tablet Commonly known as: COMPAZINE   thiamine 100 MG tablet       TAKE these medications    ALPRAZolam 0.5 MG tablet Commonly known as: XANAX Take 1 tablet (0.5 mg total) by mouth 3 (three) times daily.   bisacodyl 5 MG EC tablet Commonly known as: DULCOLAX Take 1 tablet (5 mg total) by mouth daily as needed for mild constipation.   cyclobenzaprine 10 MG tablet Commonly known as: FLEXERIL Take 0.5 tablets (5 mg total) by mouth every 8 (eight) hours. What changed:  medication strength when to take this   enoxaparin 60 MG/0.6ML injection Commonly known as: LOVENOX Inject 0.6 mLs (60 mg total) into the skin every 12 (twelve) hours.   glycopyrrolate 1 MG tablet Commonly known as: Robinul Take 1 tablet (1 mg total) by mouth 3 (three) times daily.   Oxycodone HCl 10 MG Tabs Take 1 tablet (10 mg total) by mouth every 2 (two) hours as needed for moderate pain.   polyethylene glycol 17 g packet Commonly known as: MIRALAX / GLYCOLAX Take 17 g by mouth daily.   traZODone 50 MG tablet Commonly known as: DESYREL Take 1 tablet (50 mg total) by mouth at bedtime.        No Known Allergies  Consultations: Hospice and palliative care Cardiology Neurology   Procedures/Studies: MR ANGIO HEAD WO CONTRAST  Result Date: 08/21/2020 CLINICAL DATA:  Initial evaluation for acute left-sided weakness, stroke. EXAM: MRI HEAD WITHOUT AND WITH CONTRAST MRA HEAD WITHOUT CONTRAST MRA NECK WITHOUT AND WITH CONTRAST TECHNIQUE: Multiplanar, multi-echo pulse sequences of the brain and surrounding structures were acquired without intravenous contrast. Angiographic images of the Circle of Willis were acquired using MRA technique without intravenous contrast. Angiographic images of the neck were acquired using MRA technique without and with intravenous contrast. Carotid stenosis measurements (when applicable) are obtained utilizing NASCET criteria, using the distal internal carotid diameter  as the denominator. CONTRAST:  6.52mL GADAVIST GADOBUTROL 1 MMOL/ML IV SOLN COMPARISON:  Prior CT from 08/20/2020 and MRI from 06/30/2020. FINDINGS: MRI HEAD FINDINGS Brain: Diffuse prominence of the CSF containing spaces compatible with generalized cerebral atrophy. Encephalomalacia and gliosis involving the cortical and subcortical aspect of the anterior left frontal lobe consistent with a chronic ischemic infarct, stable. There has been interval evolution of previously identified infarct at the right basal ganglia, now largely chronic in appearance. Associated susceptibility artifact now seen at this location, consistent with petechial hemorrhage. Intrinsic T1 hyperintensity likely reflects associated mineralization. Few scattered remote bilateral cerebellar infarcts noted as well. Patchy foci of restricted diffusion are seen involving the posterior right basal ganglia, immediately adjacent to the area prior infarction (series 5, image 79). Mild extension into the adjacent right external capsule. Additional patchy involvement of the right periatrial white matter (series 5, image 75) as well as the right occipital cortex (series 5, image 74). No significant mass effect. Scattered foci of susceptibility artifact at the right temporal occipital region is new from previous, and could reflect associated petechial hemorrhage (series 18, image 27). Subtle patchy cortical involvement of the right frontal operculum noted as well (a series 5, image 77). No other evidence for acute or subacute ischemia. Gray-white matter differentiation otherwise  maintained. No other acute intracranial hemorrhage. 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 No mass lesion, midline shift or mass effect. No hydrocephalus or extra-axial fluid collection. Pituitary gland suprasellar region normal.  Midline structures intact. No abnormal enhancement. Pituitary gland and suprasellar region are normal. Midline structures intact and normal. Vascular: Major intracranial vascular flow voids are maintained. Skull and upper cervical spine: Craniocervical junction within normal limits. Bone marrow signal intensity normal. No focal marrow replacing lesion. No scalp soft tissue abnormality. Sinuses/Orbits: Patient status post bilateral ocular lens replacement. Scattered mucosal thickening noted within the ethmoidal air cells. Paranasal sinuses are otherwise clear. No significant mastoid effusion. Inner ear structures grossly normal. Other: None. MRA HEAD FINDINGS ANTERIOR CIRCULATION: Visualized distal cervical segments of the internal carotid arteries are patent with antegrade flow. Petrous, cavernous, and supraclinoid segments patent without stenosis or other abnormality. A1 segments widely patent. Normal anterior communicating artery complex. Anterior cerebral arteries patent to their distal aspects without stenosis. Left M1 segment widely patent, with short-segment fenestration proximally. Normal left MCA bifurcation. Distal left MCA branches well perfused. Right M1 segment patent proximally. Severe stenosis involving the distal right M1 segment to the level of the bifurcation, stable from previous (series 1053, image 12). Right MCA branches demonstrate decreased perfusion but remain patent distally. Diffuse small vessel atheromatous irregularity seen about the MCA branches bilaterally. POSTERIOR CIRCULATION: Both V4 segments patent to the vertebrobasilar junction without stenosis. Left vertebral artery slightly dominant. Both PICA origins patent and normal. Basilar patent to its distal aspect without stenosis. Superior cerebellar arteries patent bilaterally. Left PCA supplied via the basilar. Fetal type origin of the right PCA. Both PCAs well perfused to their distal aspects without stenosis. No intracranial  aneurysm. MRA NECK FINDINGS AORTIC ARCH: Examination technically limited by timing of the contrast bolus. Partially visualized aortic arch grossly within normal limits for caliber with normal 3 vessel morphology. No hemodynamically significant stenosis seen about the origin of the great vessels. RIGHT CAROTID SYSTEM: Visualized right CCA patent from its origin to the bifurcation without stenosis. Mild atheromatous irregularity about the right carotid bulb/proximal right ICA with associated estimated stenosis of up to 30-40% stenosis by NASCET criteria. Right ICA patent distally without stenosis, evidence for dissection or occlusion. LEFT CAROTID SYSTEM: Left CCA patent from its origin to the bifurcation without stenosis. Mild atheromatous irregularity about the left carotid bulb/proximal left ICA without hemodynamically significant stenosis. Left ICA patent distally without stenosis, evidence for dissection or occlusion. VERTEBRAL ARTERIES: Both vertebral arteries arise from the subclavian arteries. Left vertebral artery dominant. Origins of the vertebral arteries not well evaluated on this technically limited exam. Visualized portions of the vertebral arteries patent without stenosis, evidence for dissection or occlusion. IMPRESSION: MRI HEAD: 1. Patchy small volume acute ischemic infarcts involving the right basal ganglia, right periatrial white matter, and overlying right frontal and occipital cortices as above. Probable mild associated petechial hemorrhage at the right occipital region. No frank hemorrhagic transformation or mass effect. 2. Interval evolution of previously identified right basal ganglia infarct, now largely chronic in appearance. 3. Additional chronic left frontal infarct, with a few additional scattered remote bilateral cerebellar infarcts. 4. 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 MRA HEAD: 1. Severe distal right M1 stenosis with  diminished perfusion distally, stable from previous. 2. No other proximal high-grade or correctable stenosis about the major  intracranial arterial circulation. 3. Diffuse small vessel atheromatous irregularity. MRA NECK: 1. Mild atheromatous irregularity about the carotid bifurcations/proximal ICAs bilaterally with estimated stenosis of up to 30-40% on the right. No significant stenosis about the left carotid artery system. 2. Widely patent vertebral arteries within the neck. Left vertebral artery dominant. Electronically Signed   By: Jeannine Boga M.D.   On: 08/21/2020 02:40   MR ANGIO NECK W WO CONTRAST  Result Date: 08/21/2020 CLINICAL DATA:  Initial evaluation for acute left-sided weakness, stroke. EXAM: MRI HEAD WITHOUT AND WITH CONTRAST MRA HEAD WITHOUT CONTRAST MRA NECK WITHOUT AND WITH CONTRAST TECHNIQUE: Multiplanar, multi-echo pulse sequences of the brain and surrounding structures were acquired without intravenous contrast. Angiographic images of the Circle of Willis were acquired using MRA technique without intravenous contrast. Angiographic images of the neck were acquired using MRA technique without and with intravenous contrast. Carotid stenosis measurements (when applicable) are obtained utilizing NASCET criteria, using the distal internal carotid diameter as the denominator. CONTRAST:  6.53mL GADAVIST GADOBUTROL 1 MMOL/ML IV SOLN COMPARISON:  Prior CT from 08/20/2020 and MRI from 06/30/2020. FINDINGS: MRI HEAD FINDINGS Brain: Diffuse prominence of the CSF containing spaces compatible with generalized cerebral atrophy. Encephalomalacia and gliosis involving the cortical and subcortical aspect of the anterior left frontal lobe consistent with a chronic ischemic infarct, stable. There has been interval evolution of previously identified infarct at the right basal ganglia, now largely chronic in appearance. Associated susceptibility artifact now seen at this location, consistent with petechial  hemorrhage. Intrinsic T1 hyperintensity likely reflects associated mineralization. Few scattered remote bilateral cerebellar infarcts noted as well. Patchy foci of restricted diffusion are seen involving the posterior right basal ganglia, immediately adjacent to the area prior infarction (series 5, image 79). Mild extension into the adjacent right external capsule. Additional patchy involvement of the right periatrial white matter (series 5, image 75) as well as the right occipital cortex (series 5, image 74). No significant mass effect. Scattered foci of susceptibility artifact at the right temporal occipital region is new from previous, and could reflect associated petechial hemorrhage (series 18, image 27). Subtle patchy cortical involvement of the right frontal operculum noted as well (a series 5, image 77). No other evidence for acute or subacute ischemia. Gray-white matter differentiation otherwise maintained. No other acute intracranial hemorrhage. 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 No mass lesion, midline shift or mass effect. No hydrocephalus or extra-axial fluid collection. Pituitary gland suprasellar region normal. Midline structures intact. No abnormal enhancement. Pituitary gland and suprasellar region are normal. Midline structures intact and normal. Vascular: Major intracranial vascular flow voids are maintained. Skull and upper cervical spine: Craniocervical junction within normal limits. Bone marrow signal intensity normal. No focal marrow replacing lesion. No scalp soft tissue abnormality. Sinuses/Orbits: Patient status post bilateral ocular lens replacement. Scattered mucosal thickening noted within the ethmoidal air cells. Paranasal sinuses are otherwise clear. No significant mastoid effusion. Inner ear structures grossly normal. Other:  None. MRA HEAD FINDINGS ANTERIOR CIRCULATION: Visualized distal cervical segments of the internal carotid arteries are patent with antegrade flow. Petrous, cavernous, and supraclinoid segments patent without stenosis or other abnormality. A1 segments widely patent. Normal anterior communicating artery complex. Anterior cerebral arteries patent to their distal aspects without stenosis. Left M1 segment widely patent, with short-segment fenestration proximally. Normal left MCA bifurcation. Distal left MCA branches well perfused. Right M1 segment patent  proximally. Severe stenosis involving the distal right M1 segment to the level of the bifurcation, stable from previous (series 1053, image 12). Right MCA branches demonstrate decreased perfusion but remain patent distally. Diffuse small vessel atheromatous irregularity seen about the MCA branches bilaterally. POSTERIOR CIRCULATION: Both V4 segments patent to the vertebrobasilar junction without stenosis. Left vertebral artery slightly dominant. Both PICA origins patent and normal. Basilar patent to its distal aspect without stenosis. Superior cerebellar arteries patent bilaterally. Left PCA supplied via the basilar. Fetal type origin of the right PCA. Both PCAs well perfused to their distal aspects without stenosis. No intracranial aneurysm. MRA NECK FINDINGS AORTIC ARCH: Examination technically limited by timing of the contrast bolus. Partially visualized aortic arch grossly within normal limits for caliber with normal 3 vessel morphology. No hemodynamically significant stenosis seen about the origin of the great vessels. RIGHT CAROTID SYSTEM: Visualized right CCA patent from its origin to the bifurcation without stenosis. Mild atheromatous irregularity about the right carotid bulb/proximal right ICA with associated estimated stenosis of up to 30-40% stenosis by NASCET criteria. Right ICA patent distally without stenosis, evidence for dissection or occlusion. LEFT  CAROTID SYSTEM: Left CCA patent from its origin to the bifurcation without stenosis. Mild atheromatous irregularity about the left carotid bulb/proximal left ICA without hemodynamically significant stenosis. Left ICA patent distally without stenosis, evidence for dissection or occlusion. VERTEBRAL ARTERIES: Both vertebral arteries arise from the subclavian arteries. Left vertebral artery dominant. Origins of the vertebral arteries not well evaluated on this technically limited exam. Visualized portions of the vertebral arteries patent without stenosis, evidence for dissection or occlusion. IMPRESSION: MRI HEAD: 1. Patchy small volume acute ischemic infarcts involving the right basal ganglia, right periatrial white matter, and overlying right frontal and occipital cortices as above. Probable mild associated petechial hemorrhage at the right occipital region. No frank hemorrhagic transformation or mass effect. 2. Interval evolution of previously identified right basal ganglia infarct, now largely chronic in appearance. 3. Additional chronic left frontal infarct, with a few additional scattered remote bilateral cerebellar infarcts. 4. 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 MRA HEAD: 1. 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 2. No other proximal high-grade or correctable stenosis about the major intracranial arterial circulation. 3. Diffuse small vessel atheromatous irregularity. MRA NECK: 1. Mild atheromatous irregularity about the carotid bifurcations/proximal ICAs bilaterally with estimated stenosis of up to 30-40% on the right. No significant stenosis about the left carotid artery system. 2. Widely patent vertebral arteries within the neck. Left vertebral artery dominant. Electronically Signed   By: Jeannine Boga M.D.   On: 08/21/2020 02:40   MR BRAIN W WO CONTRAST  Result Date:  08/21/2020 CLINICAL DATA:  Initial evaluation for acute left-sided weakness, stroke. EXAM: MRI HEAD WITHOUT AND WITH CONTRAST MRA HEAD WITHOUT CONTRAST MRA NECK WITHOUT AND WITH CONTRAST TECHNIQUE: Multiplanar, multi-echo pulse sequences of the brain and surrounding structures were acquired without intravenous contrast. Angiographic images of the Circle of Willis were acquired using MRA technique without intravenous contrast. Angiographic images of the neck were acquired using MRA technique without and with intravenous contrast. Carotid stenosis measurements (when applicable) are obtained utilizing NASCET criteria, using the distal internal carotid diameter as the denominator. CONTRAST:  6.66mL GADAVIST GADOBUTROL 1 MMOL/ML IV SOLN COMPARISON:  Prior CT from 08/20/2020 and MRI from 06/30/2020. FINDINGS: MRI HEAD FINDINGS Brain: Diffuse prominence of the CSF containing spaces compatible with generalized cerebral atrophy. Encephalomalacia and gliosis involving the  cortical and subcortical aspect of the anterior left frontal lobe consistent with a chronic ischemic infarct, stable. There has been interval evolution of previously identified infarct at the right basal ganglia, now largely chronic in appearance. Associated susceptibility artifact now seen at this location, consistent with petechial hemorrhage. Intrinsic T1 hyperintensity likely reflects associated mineralization. Few scattered remote bilateral cerebellar infarcts noted as well. Patchy foci of restricted diffusion are seen involving the posterior right basal ganglia, immediately adjacent to the area prior infarction (series 5, image 79). Mild extension into the adjacent right external capsule. Additional patchy involvement of the right periatrial white matter (series 5, image 75) as well as the right occipital cortex (series 5, image 74). No significant mass effect. Scattered foci of susceptibility artifact at the right temporal occipital region is new from  previous, and could reflect associated petechial hemorrhage (series 18, image 27). Subtle patchy cortical involvement of the right frontal operculum noted as well (a series 5, image 77). No other evidence for acute or subacute ischemia. Gray-white matter differentiation otherwise maintained. No other acute intracranial hemorrhage. 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 No mass lesion, midline shift or mass effect. No hydrocephalus or extra-axial fluid collection. Pituitary gland suprasellar region normal. Midline structures intact. No abnormal enhancement. Pituitary gland and suprasellar region are normal. Midline structures intact and normal. Vascular: Major intracranial vascular flow voids are maintained. Skull and upper cervical spine: Craniocervical junction within normal limits. Bone marrow signal intensity normal. No focal marrow replacing lesion. No scalp soft tissue abnormality. Sinuses/Orbits: Patient status post bilateral ocular lens replacement. Scattered mucosal thickening noted within the ethmoidal air cells. Paranasal sinuses are otherwise clear. No significant mastoid effusion. Inner ear structures grossly normal. Other: None. MRA HEAD FINDINGS ANTERIOR CIRCULATION: Visualized distal cervical segments of the internal carotid arteries are patent with antegrade flow. Petrous, cavernous, and supraclinoid segments patent without stenosis or other abnormality. A1 segments widely patent. Normal anterior communicating artery complex. Anterior cerebral arteries patent to their distal aspects without stenosis. Left M1 segment widely patent, with short-segment fenestration proximally. Normal left MCA bifurcation. Distal left MCA branches well perfused. Right M1 segment patent proximally. Severe stenosis involving the distal right M1 segment to the level of  the bifurcation, stable from previous (series 1053, image 12). Right MCA branches demonstrate decreased perfusion but remain patent distally. Diffuse small vessel atheromatous irregularity seen about the MCA branches bilaterally. POSTERIOR CIRCULATION: Both V4 segments patent to the vertebrobasilar junction without stenosis. Left vertebral artery slightly dominant. Both PICA origins patent and normal. Basilar patent to its distal aspect without stenosis. Superior cerebellar arteries patent bilaterally. Left PCA supplied via the basilar. Fetal type origin of the right PCA. Both PCAs well perfused to their distal aspects without stenosis. No intracranial aneurysm. MRA NECK FINDINGS AORTIC ARCH: Examination technically limited by timing of the contrast bolus. Partially visualized aortic arch grossly within normal limits for caliber with normal 3 vessel morphology. No hemodynamically significant stenosis seen about the origin of the great vessels. RIGHT CAROTID SYSTEM: Visualized right CCA patent from its origin to the bifurcation without stenosis. Mild atheromatous irregularity about the right carotid bulb/proximal right ICA with associated estimated stenosis of up to 30-40% stenosis by NASCET criteria. Right ICA patent distally without stenosis, evidence for dissection or occlusion. LEFT CAROTID SYSTEM: Left CCA patent from its origin to the bifurcation without stenosis. Mild atheromatous irregularity  about the left carotid bulb/proximal left ICA without hemodynamically significant stenosis. Left ICA patent distally without stenosis, evidence for dissection or occlusion. VERTEBRAL ARTERIES: Both vertebral arteries arise from the subclavian arteries. Left vertebral artery dominant. Origins of the vertebral arteries not well evaluated on this technically limited exam. Visualized portions of the vertebral arteries patent without stenosis, evidence for dissection or occlusion. IMPRESSION: MRI HEAD: 1. 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 Probable mild associated petechial hemorrhage at the right occipital region. No frank hemorrhagic transformation or mass effect. 2. Interval evolution of previously identified right basal ganglia infarct, now largely chronic in appearance. 3. Additional chronic left frontal infarct, with a few additional scattered remote bilateral cerebellar infarcts. 4. 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 MRA HEAD: 1. 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 2. No other proximal high-grade or correctable stenosis about the major intracranial arterial circulation. 3. Diffuse small vessel atheromatous irregularity. MRA NECK: 1. Mild atheromatous irregularity about the carotid bifurcations/proximal ICAs bilaterally with estimated stenosis of up to 30-40% on the right. No significant stenosis about the left carotid artery system. 2. Widely patent vertebral arteries within the neck. Left vertebral artery dominant. Electronically Signed   By: Jeannine Boga M.D.   On: 08/21/2020 02:40   ECHOCARDIOGRAM COMPLETE  Result Date: 08/22/2020    ECHOCARDIOGRAM REPORT   Patient Name:   MARCELLINO FIDALGO Date of Exam: 08/22/2020 Medical Rec #:  [id number]      Height:       71.0 in Accession #:    [id number]     Weight:       142.1 lb Date of Birth:  December 24, 1950      BSA:          1.823 m Patient Age:    53 years       BP:           111/68 mmHg Patient Gender: M              HR:           96 bpm. Exam Location:  Inpatient Procedure: 2D Echo, Cardiac Doppler and Color Doppler Indications:    Stroke                 MV disorder  History:        Patient has prior history of Echocardiogram examinations, most                 recent 07/12/2020. Risk Factors:Hypertension and Current Smoker.                  Prostate cancer. ETOH abuse.  Sonographer:    Clayton Lefort RDCS (AE) Referring Phys: [place]  1. Left ventricular ejection fraction, by estimation, is 60 to 65%. The left ventricle has normal function. The left ventricle has no regional wall motion abnormalities. Left ventricular diastolic parameters are consistent with Grade I diastolic dysfunction (impaired relaxation).  2. Right ventricular systolic function is mildly reduced. The right ventricular size is normal. Tricuspid regurgitation signal is inadequate for assessing PA pressure.  3. There is a large density on the mitral valve annulus that is most consistent with severe mitral annular calcification. . The mitral valve is degenerative. No evidence of mitral valve regurgitation. No evidence of mitral stenosis. Severe  mitral annular calcification.  4. There is a large density on the AV that is likely eccentric calcific deegeneration of the aortic valve but cannot rule out mass. In the setting of CVA, recommend TEE to better define the aortic valve. . The aortic valve is calcified. There is severe calcifcation of the aortic valve. There is severe thickening of the aortic valve. Aortic valve regurgitation is moderate. No aortic stenosis is present. Aortic regurgitation PHT measures 359 msec. Aortic valve area, by VTI measures 3.77 cm. Aortic valve  mean gradient measures 5.0 mmHg. Aortic valve Vmax measures 1.49 m/s.  5. Aortic dilatation noted. There is mild dilatation of the aortic root, measuring 41 mm.  6. The inferior vena cava is normal in size with greater than 50% respiratory variability, suggesting right atrial pressure of 3 mmHg. FINDINGS  Left Ventricle: Left ventricular ejection fraction, by estimation, is 60 to 65%. The left ventricle has normal function. The left ventricle has no regional wall motion abnormalities. The left ventricular internal cavity size was normal in size. There is  no left ventricular  hypertrophy. Left ventricular diastolic parameters are consistent with Grade I diastolic dysfunction (impaired relaxation). Normal left ventricular filling pressure. Right Ventricle: The right ventricular size is normal. No increase in right ventricular wall thickness. Right ventricular systolic function is mildly reduced. Tricuspid regurgitation signal is inadequate for assessing PA pressure. Left Atrium: Left atrial size was normal in size. Right Atrium: Right atrial size was normal in size. Pericardium: There is no evidence of pericardial effusion. Mitral Valve: There is a large density on the mitral valve annulus that is most consistent with severe mitral annular calcification. The mitral valve is degenerative in appearance. There is moderate thickening of the anterior mitral valve leaflet(s). There is moderate calcification of the anterior mitral valve leaflet(s). Severe mitral annular calcification. No evidence of mitral valve regurgitation. No evidence of mitral valve stenosis. Tricuspid Valve: The tricuspid valve is normal in structure. Tricuspid valve regurgitation is trivial. No evidence of tricuspid stenosis. Aortic Valve: There is a large density on the AV that is likely eccentric calcific deegeneration of the aortic valve but cannot rule out mass. In the setting of CVA, recommend TEE to better define the aortic valve. The aortic valve is calcified. There is  severe calcifcation of the aortic valve. There is severe thickening of the aortic valve. Aortic valve regurgitation is moderate. Aortic regurgitation PHT measures 359 msec. No aortic stenosis is present. Aortic valve mean gradient measures 5.0 mmHg. Aortic valve peak gradient measures 8.9 mmHg. Aortic valve area, by VTI measures 3.77 cm. Pulmonic Valve: The pulmonic valve was normal in structure. Pulmonic valve regurgitation is not visualized. No evidence of pulmonic stenosis. Aorta: Aortic dilatation noted. There is mild dilatation of the aortic  root, measuring 41 mm. Venous: The inferior vena cava is normal in size with greater than 50% respiratory variability, suggesting right atrial pressure of 3 mmHg. IAS/Shunts: No atrial level shunt detected by color flow Doppler.  LEFT VENTRICLE PLAX 2D LVIDd:         4.40 cm  Diastology LVIDs:         3.10 cm  LV e' medial:    6.42 cm/s LV PW:         1.00 cm  LV E/e' medial:  10.2 LV IVS:        1.10 cm  LV e' lateral:   10.10 cm/s LVOT diam:     2.30 cm  LV E/e' lateral: 6.5 LV  SV:         91 LV SV Index:   50 LVOT Area:     4.15 cm  RIGHT VENTRICLE             IVC RV Basal diam:  2.50 cm     IVC diam: 1.80 cm RV S prime:     12.00 cm/s TAPSE (M-mode): 1.6 cm LEFT ATRIUM             Index       RIGHT ATRIUM           Index LA diam:        3.10 cm 1.70 cm/m  RA Area:     13.40 cm LA Vol (A2C):   28.6 ml 15.68 ml/m RA Volume:   31.20 ml  17.11 ml/m LA Vol (A4C):   26.9 ml 14.75 ml/m LA Biplane Vol: 30.5 ml 16.73 ml/m  AORTIC VALVE AV Area (Vmax):    3.43 cm AV Area (Vmean):   3.20 cm AV Area (VTI):     3.77 cm AV Vmax:           149.00 cm/s AV Vmean:          111.000 cm/s AV VTI:            0.240 m AV Peak Grad:      8.9 mmHg AV Mean Grad:      5.0 mmHg LVOT Vmax:         123.00 cm/s LVOT Vmean:        85.400 cm/s LVOT VTI:          0.218 m LVOT/AV VTI ratio: 0.91 AI PHT:            359 msec  AORTA Ao Root diam: 4.10 cm MITRAL VALVE MV Area (PHT): 4.29 cm     SHUNTS MV Decel Time: 177 msec     Systemic VTI:  0.22 m MV E velocity: 65.60 cm/s   Systemic Diam: 2.30 cm MV A velocity: 108.00 cm/s MV E/A ratio:  0.61 Fransico Him MD Electronically signed by Fransico Him MD Signature Date/Time: 08/22/2020/3:25:05 PM    Final    ECHO TEE  Result Date: 08/24/2020    TRANSESOPHOGEAL ECHO REPORT   Patient Name:   LANNIE YUSUF Date of Exam: 08/24/2020 Medical Rec #:  [id number]      Height:       71.0 in Accession #:    [id number]     Weight:       142.1 lb Date of Birth:  06/05/1950      BSA:          1.823 m  Patient Age:    83 years       BP:           116/69 mmHg Patient Gender: M              HR:           88 bpm. Exam Location:  Inpatient Procedure: Transesophageal Echo, Cardiac Doppler, Color Doppler and Saline            Contrast Bubble Study Indications:     Stroke I63.9  History:         Patient has prior history of Echocardiogram examinations, most                  recent 08/22/2020. Stroke, Signs/Symptoms:Dyspnea; Risk  Factors:Hypertension. GERD. Cancer. ETOH abuse.  Sonographer:     Jonelle Sidle Dance Referring Phys:  [id number] Leanor Kail Diagnosing Phys: Mertie Moores MD PROCEDURE: The transesophogeal probe was passed without difficulty through the esophogus of the patient. Sedation performed by different physician. The patient was monitored while under deep sedation. Anesthestetic sedation was provided intravenously by Anesthesiology: 144.01mg  of Propofol, 60mg  of Lidocaine. The patient developed no complications during the procedure. IMPRESSIONS  1. Left ventricular ejection fraction, by estimation, is 60 to 65%. The left ventricle has normal function.  2. Right ventricular systolic function is normal. The right ventricular size is normal.  3. No left atrial/left atrial appendage thrombus was detected.  4. The mitral valve is grossly normal. Mild to moderate mitral valve regurgitation.  5. 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 This mass appears to be more mobile than on his last echo on Jul 12, 2020. . The aortic valve is abnormal. Aortic valve regurgitation is moderate to severe. FINDINGS  Left Ventricle: Left ventricular ejection fraction, by estimation, is 60 to 65%. The left ventricle has normal function. The left ventricular internal cavity size was normal in size. Right Ventricle: The right ventricular size is normal. Right vetricular wall thickness was not well visualized. Right ventricular systolic function is normal. Left Atrium: Left atrial size was normal in  size. No left atrial/left atrial appendage thrombus was detected. Right Atrium: Right atrial size was normal in size. Pericardium: There is no evidence of pericardial effusion. Mitral Valve: The mitral valve is grossly normal. Mild to moderate mitral annular calcification. Mild to moderate mitral valve regurgitation. Tricuspid Valve: The tricuspid valve is grossly normal. Tricuspid valve regurgitation is mild. 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 This mass appears to be more mobile than on his last echo on Jul 12, 2020. The aortic valve is abnormal. Aortic valve regurgitation is moderate to severe. Pulmonic Valve: The pulmonic valve was grossly normal. Pulmonic valve regurgitation is mild. Aorta: The aortic root and ascending aorta are structurally normal, with no evidence of dilitation. IAS/Shunts: No atrial level shunt detected by color flow Doppler. Agitated saline contrast was given intravenously to evaluate for intracardiac shunting. Mertie Moores MD Electronically signed by Mertie Moores MD Signature Date/Time: 08/24/2020/4:33:11 PM    Final    CT HEAD CODE STROKE WO CONTRAST  Result Date: 08/20/2020 CLINICAL DATA:  Code stroke.  Weakness and facial droop EXAM: CT HEAD WITHOUT CONTRAST TECHNIQUE: Contiguous axial images were obtained from the base of the skull through the vertex without intravenous contrast. COMPARISON:  MRI 06/30/2020 FINDINGS: Brain: There is no acute intracranial hemorrhage, mass effect, or edema. No acute appearing loss of gray-white differentiation. Infarction of the right corona radiata and basal ganglia seen on prior MRI is identified. There is a chronic left frontal infarction involving the operculum. There may be a chronic cortical infarct of the posterior right opercular region. Additional patchy hypoattenuation in the supratentorial white matter is nonspecific but probably reflects chronic microvascular ischemic changes. Prominence of the  ventricles and sulci reflects generalized parenchymal volume loss. No extra-axial collection. Vascular: No hyperdense vessel. There are foci of calcification along the right M1 and M2 MCA. Skull: Unremarkable. Sinuses/Orbits: No acute finding. Other: Mastoid air cells are clear. ASPECTS [facility] Stroke Program Early CT Score) - Ganglionic level infarction (caudate, lentiform nuclei, internal capsule, insula, M1-M3 cortex): 7 - Supraganglionic infarction (M4-M6 cortex): 3 Total score (0-10 with 10 being normal):  10 IMPRESSION: There is no acute intracranial hemorrhage or evidence of acute infarction. ASPECT score is 10. Chronic infarcts as described. Foci of calcification along the right M1 and M2 MCA. Could reflect calcified emboli. Right M1 MCA irregularity on the prior MRA is likely related. These results were communicated to Dr. Lorrin Goodell at 7:18 pm on 08/20/2020 by text page via the [facility] messaging system. Electronically Signed   By: Macy Mis M.D.   On: 08/20/2020 19:22    Subjective: No complains  Discharge Exam: Vitals:   08/29/20 2343 08/30/20 0850  BP: 108/78 119/76  Pulse: (!) 108 94  Resp: 18 17  Temp: 98.2 F (36.8 C)   SpO2: 92% 100%   Vitals:   08/29/20 0718 08/29/20 1937 08/29/20 2343 08/30/20 0850  BP: 111/72 112/70 108/78 119/76  Pulse: 90 (!) 101 (!) 108 94  Resp: _0 Temp: 98.2 F (36.8 C) 98.4 F (36.9 C) 98.2 F (36.8 C)   TempSrc: Oral Oral Oral Oral  SpO2: 95% 93% 92% 100%  Weight:      Height:        General: Pt is alert, awake, not in acute distress Cardiovascular: RRR, S1/S2 +, no rubs, no gallops Respiratory: CTA bilaterally, no wheezing, no rhonchi Abdominal: Soft, NT, ND, bowel sounds + Extremities: no edema, no cyanosis    The results of significant diagnostics from this hospitalization (including imaging, microbiology, ancillary and laboratory) are listed below for reference.     Microbiology: Recent Results (from the past 240  hour(s))  SARS CORONAVIRUS 2 (TAT 6-24 HRS) Nasopharyngeal Nasopharyngeal Swab     Status: None   Collection Time: 08/20/20  9:36 PM   Specimen: Nasopharyngeal Swab  Result Value Ref Range Status   SARS Coronavirus 2 NEGATIVE NEGATIVE Final    Comment: (NOTE) SARS-CoV-2 target nucleic acids are NOT DETECTED.  The SARS-CoV-2 RNA is generally detectable in upper and lower respiratory specimens during the acute phase of infection. Negative results do not preclude SARS-CoV-2 infection, do not rule out co-infections with other pathogens, and should not be used as the sole basis for treatment or other patient management decisions. Negative results must be combined with clinical observations, patient history, and epidemiological information. The expected result is Negative.  Fact Sheet for Patients: SugarRoll.be  Fact Sheet for Healthcare Providers: https://www.woods-mathews.com/  This test is not yet approved or cleared by the [place] FDA and  has been authorized for detection and/or diagnosis of SARS-CoV-2 by FDA under an Emergency Use Authorization (EUA). This EUA will remain  in effect (meaning this test can be used) for the duration of the COVID-19 declaration under Se ction 564(b)(1) of the Act, 21 U.S.C. section 360bbb-3(b)(1), unless the authorization is terminated or revoked sooner.  Performed at [facility], [place] [address] [place], [place] [address]   Resp Panel by RT-PCR (Flu A&B, Covid) Nasopharyngeal Swab     Status: None   Collection Time: 08/23/20 12:06 PM   Specimen: Nasopharyngeal Swab; Nasopharyngeal(NP) swabs in vial transport medium  Result Value Ref Range Status   SARS Coronavirus 2 by RT PCR NEGATIVE NEGATIVE Final    Comment: (NOTE) SARS-CoV-2 target nucleic acids are NOT DETECTED.  The SARS-CoV-2 RNA is generally detectable in upper respiratory specimens during the acute phase of infection. The  lowest concentration of SARS-CoV-2 viral copies this assay can detect is 138 copies/mL. A negative result does not preclude SARS-Cov-2 infection and should not be used as the sole  basis for treatment or other patient management decisions. A negative result may occur with  improper specimen collection/handling, submission of specimen other than nasopharyngeal swab, presence of viral mutation(s) within the areas targeted by this assay, and inadequate number of viral copies(<138 copies/mL). A negative result must be combined with clinical observations, patient history, and epidemiological information. The expected result is Negative.  Fact Sheet for Patients:  EntrepreneurPulse.com.au  Fact Sheet for Healthcare Providers:  IncredibleEmployment.be  This test is no t yet approved or cleared by the [place] FDA and  has been authorized for detection and/or diagnosis of SARS-CoV-2 by FDA under an Emergency Use Authorization (EUA). This EUA will remain  in effect (meaning this test can be used) for the duration of the COVID-19 declaration under Section 564(b)(1) of the Act, 21 U.S.C.section 360bbb-3(b)(1), unless the authorization is terminated  or revoked sooner.       Influenza A by PCR NEGATIVE NEGATIVE Final   Influenza B by PCR NEGATIVE NEGATIVE Final    Comment: (NOTE) The Xpert Xpress SARS-CoV-2/FLU/RSV plus assay is intended as an aid in the diagnosis of influenza from Nasopharyngeal swab specimens and should not be used as a sole basis for treatment. Nasal washings and aspirates are unacceptable for Xpert Xpress SARS-CoV-2/FLU/RSV testing.  Fact Sheet for Patients: EntrepreneurPulse.com.au  Fact Sheet for Healthcare Providers: IncredibleEmployment.be  This test is not yet approved or cleared by the [place] FDA and has been authorized for detection and/or diagnosis of SARS-CoV-2 by FDA under  an Emergency Use Authorization (EUA). This EUA will remain in effect (meaning this test can be used) for the duration of the COVID-19 declaration under Section 564(b)(1) of the Act, 21 U.S.C. section 360bbb-3(b)(1), unless the authorization is terminated or revoked.  Performed at [facility], [place] [address], [place], [place] [address]   Culture, blood (routine x 2)     Status: None   Collection Time: 08/24/20  6:00 PM   Specimen: BLOOD  Result Value Ref Range Status   Specimen Description BLOOD RIGHT ANTECUBITAL  Final   Special Requests   Final    BOTTLES DRAWN AEROBIC AND ANAEROBIC Blood Culture adequate volume   Culture   Final    NO GROWTH 5 DAYS Performed at [facility], [place] [address] [place], [place] [address]    Report Status 08/29/2020 FINAL  Final  Culture, blood (routine x 2)     Status: Abnormal   Collection Time: 08/24/20  6:05 PM   Specimen: BLOOD  Result Value Ref Range Status   Specimen Description BLOOD BLOOD RIGHT FOREARM  Final   Special Requests   Final    BOTTLES DRAWN AEROBIC AND ANAEROBIC Blood Culture results may not be optimal due to an inadequate volume of blood received in culture bottles   Culture  Setup Time   Final    GRAM POSITIVE COCCI IN CLUSTERS IN BOTH AEROBIC AND ANAEROBIC BOTTLES CRITICAL RESULT CALLED TO, READ BACK BY AND VERIFIED WITH: J. LEDFORD PHARMD, AT 9390 08/25/20 D. VANHOOK    Culture (A)  Final    AEROCOCCUS URINAE THE SIGNIFICANCE OF ISOLATING THIS ORGANISM FROM A SINGLE SET OF BLOOD CULTURES WHEN MULTIPLE SETS ARE DRAWN IS UNCERTAIN. PLEASE NOTIFY THE MICROBIOLOGY DEPARTMENT WITHIN ONE WEEK IF SPECIATION AND SENSITIVITIES ARE REQUIRED. Performed at [facility], [place] [address] [place],  [address]    Report Status 08/27/2020 FINAL  Final  Blood Culture ID Panel (Reflexed)  Status: None   Collection Time: 08/24/20  6:05 PM  Result Value Ref Range Status   Enterococcus faecalis NOT DETECTED NOT  DETECTED Final   Enterococcus Faecium NOT DETECTED NOT DETECTED Final   Listeria monocytogenes NOT DETECTED NOT DETECTED Final   Staphylococcus species NOT DETECTED NOT DETECTED Final   Staphylococcus aureus (BCID) NOT DETECTED NOT DETECTED Final   Staphylococcus epidermidis NOT DETECTED NOT DETECTED Final   Staphylococcus lugdunensis NOT DETECTED NOT DETECTED Final   Streptococcus species NOT DETECTED NOT DETECTED Final   Streptococcus agalactiae NOT DETECTED NOT DETECTED Final   Streptococcus pneumoniae NOT DETECTED NOT DETECTED Final   Streptococcus pyogenes NOT DETECTED NOT DETECTED Final   A.calcoaceticus-baumannii NOT DETECTED NOT DETECTED Final   Bacteroides fragilis NOT DETECTED NOT DETECTED Final   Enterobacterales NOT DETECTED NOT DETECTED Final   Enterobacter cloacae complex NOT DETECTED NOT DETECTED Final   Escherichia coli NOT DETECTED NOT DETECTED Final   Klebsiella aerogenes NOT DETECTED NOT DETECTED Final   Klebsiella oxytoca NOT DETECTED NOT DETECTED Final   Klebsiella pneumoniae NOT DETECTED NOT DETECTED Final   Proteus species NOT DETECTED NOT DETECTED Final   Salmonella species NOT DETECTED NOT DETECTED Final   Serratia marcescens NOT DETECTED NOT DETECTED Final   Haemophilus influenzae NOT DETECTED NOT DETECTED Final   Neisseria meningitidis NOT DETECTED NOT DETECTED Final   Pseudomonas aeruginosa NOT DETECTED NOT DETECTED Final   Stenotrophomonas maltophilia NOT DETECTED NOT DETECTED Final   Candida albicans NOT DETECTED NOT DETECTED Final   Candida auris NOT DETECTED NOT DETECTED Final   Candida glabrata NOT DETECTED NOT DETECTED Final   Candida krusei NOT DETECTED NOT DETECTED Final   Candida parapsilosis NOT DETECTED NOT DETECTED Final   Candida tropicalis NOT DETECTED NOT DETECTED Final   Cryptococcus neoformans/gattii NOT DETECTED NOT DETECTED Final    Comment: Performed at [facility], [address], [place], [place] [address]  Resp Panel by  RT-PCR (Flu A&B, Covid) Nasopharyngeal Swab     Status: None   Collection Time: 08/30/20 10:10 AM   Specimen: Nasopharyngeal Swab; Nasopharyngeal(NP) swabs in vial transport medium  Result Value Ref Range Status   SARS Coronavirus 2 by RT PCR NEGATIVE NEGATIVE Final    Comment: (NOTE) SARS-CoV-2 target nucleic acids are NOT DETECTED.  The SARS-CoV-2 RNA is generally detectable in upper respiratory specimens during the acute phase of infection. The lowest concentration of SARS-CoV-2 viral copies this assay can detect is 138 copies/mL. A negative result does not preclude SARS-Cov-2 infection and should not be used as the sole basis for treatment or other patient management decisions. A negative result may occur with  improper specimen collection/handling, submission of specimen other than nasopharyngeal swab, presence of viral mutation(s) within the areas targeted by this assay, and inadequate number of viral copies(<138 copies/mL). A negative result must be combined with clinical observations, patient history, and epidemiological information. The expected result is Negative.  Fact Sheet for Patients:  EntrepreneurPulse.com.au  Fact Sheet for Healthcare Providers:  IncredibleEmployment.be  This test is no t yet approved or cleared by the [place] FDA and  has been authorized for detection and/or diagnosis of SARS-CoV-2 by FDA under an Emergency Use Authorization (EUA). This EUA will remain  in effect (meaning this test can be used) for the duration of the COVID-19 declaration under Section 564(b)(1) of the Act, 21 U.S.C.section 360bbb-3(b)(1), unless the authorization is terminated  or revoked sooner.       Influenza A by  PCR NEGATIVE NEGATIVE Final   Influenza B by PCR NEGATIVE NEGATIVE Final    Comment: (NOTE) The Xpert Xpress SARS-CoV-2/FLU/RSV plus assay is intended as an aid in the diagnosis of influenza from Nasopharyngeal swab  specimens and should not be used as a sole basis for treatment. Nasal washings and aspirates are unacceptable for Xpert Xpress SARS-CoV-2/FLU/RSV testing.  Fact Sheet for Patients: EntrepreneurPulse.com.au  Fact Sheet for Healthcare Providers: IncredibleEmployment.be  This test is not yet approved or cleared by the [place] FDA and has been authorized for detection and/or diagnosis of SARS-CoV-2 by FDA under an Emergency Use Authorization (EUA). This EUA will remain in effect (meaning this test can be used) for the duration of the COVID-19 declaration under Section 564(b)(1) of the Act, 21 U.S.C. section 360bbb-3(b)(1), unless the authorization is terminated or revoked.  Performed at [facility], [place] [address] [place], [place] [address]      Labs: BNP (last 3 results) No results for input(s): BNP in the last 8760 hours. Basic Metabolic Panel: Recent Labs  Lab 08/28/20 0706  NA 134*  K 3.9  CL 103  CO2 22  GLUCOSE 109*  BUN 8  CREATININE 0.43*  CALCIUM 8.8*   Liver Function Tests: No results for input(s): AST, ALT, ALKPHOS, BILITOT, PROT, ALBUMIN in the last 168 hours. No results for input(s): LIPASE, AMYLASE in the last 168 hours. No results for input(s): AMMONIA in the last 168 hours. CBC: Recent Labs  Lab 08/25/20 0324 08/26/20 0443 08/27/20 0415 08/28/20 0706  WBC 4.6 4.4 5.0 5.8  HGB 10.8* 11.3* 10.7* 10.9*  HCT 33.2* 35.3* 34.0* 33.5*  MCV 92.0 91.7 91.9 89.6  PLT 283 246 240 230   Cardiac Enzymes: No results for input(s): CKTOTAL, CKMB, CKMBINDEX, TROPONINI in the last 168 hours. BNP: Invalid input(s): POCBNP CBG: Recent Labs  Lab 08/23/20 2001  GLUCAP 113*   D-Dimer No results for input(s): DDIMER in the last 72 hours. Hgb A1c No results for input(s): HGBA1C in the last 72 hours. Lipid Profile No results for input(s): CHOL, HDL, LDLCALC, TRIG, CHOLHDL, LDLDIRECT in the last 72  hours. Thyroid function studies No results for input(s): TSH, T4TOTAL, T3FREE, THYROIDAB in the last 72 hours.  Invalid input(s): FREET3 Anemia work up No results for input(s): VITAMINB12, FOLATE, FERRITIN, TIBC, IRON, RETICCTPCT in the last 72 hours. Urinalysis    Component Value Date/Time   COLORURINE RED (A) 07/13/2020 1901   APPEARANCEUR TURBID (A) 07/13/2020 1901   LABSPEC  07/13/2020 1901    TEST NOT REPORTED DUE TO COLOR INTERFERENCE OF URINE PIGMENT   PHURINE  07/13/2020 1901    TEST NOT REPORTED DUE TO COLOR INTERFERENCE OF URINE PIGMENT   GLUCOSEU (A) 07/13/2020 1901    TEST NOT REPORTED DUE TO COLOR INTERFERENCE OF URINE PIGMENT   HGBUR (A) 07/13/2020 1901    TEST NOT REPORTED DUE TO COLOR INTERFERENCE OF URINE PIGMENT   BILIRUBINUR (A) 07/13/2020 1901    TEST NOT REPORTED DUE TO COLOR INTERFERENCE OF URINE PIGMENT   KETONESUR (A) 07/13/2020 1901    TEST NOT REPORTED DUE TO COLOR INTERFERENCE OF URINE PIGMENT   PROTEINUR (A) 07/13/2020 1901    TEST NOT REPORTED DUE TO COLOR INTERFERENCE OF URINE PIGMENT   UROBILINOGEN 2.0 (H) 12/13/2013 0738   NITRITE (A) 07/13/2020 1901    TEST NOT REPORTED DUE TO COLOR INTERFERENCE OF URINE PIGMENT   LEUKOCYTESUR (A) 07/13/2020 1901    TEST NOT REPORTED DUE TO COLOR  INTERFERENCE OF URINE PIGMENT   Sepsis Labs Invalid input(s): PROCALCITONIN,  WBC,  LACTICIDVEN Microbiology Recent Results (from the past 240 hour(s))  SARS CORONAVIRUS 2 (TAT 6-24 HRS) Nasopharyngeal Nasopharyngeal Swab     Status: None   Collection Time: 08/20/20  9:36 PM   Specimen: Nasopharyngeal Swab  Result Value Ref Range Status   SARS Coronavirus 2 NEGATIVE NEGATIVE Final    Comment: (NOTE) SARS-CoV-2 target nucleic acids are NOT DETECTED.  The SARS-CoV-2 RNA is generally detectable in upper and lower respiratory specimens during the acute phase of infection. Negative results do not preclude SARS-CoV-2 infection, do not rule out co-infections with other  pathogens, and should not be used as the sole basis for treatment or other patient management decisions. Negative results must be combined with clinical observations, patient history, and epidemiological information. The expected result is Negative.  Fact Sheet for Patients: SugarRoll.be  Fact Sheet for Healthcare Providers: https://www.woods-mathews.com/  This test is not yet approved or cleared by the [place] FDA and  has been authorized for detection and/or diagnosis of SARS-CoV-2 by FDA under an Emergency Use Authorization (EUA). This EUA will remain  in effect (meaning this test can be used) for the duration of the COVID-19 declaration under Se ction 564(b)(1) of the Act, 21 U.S.C. section 360bbb-3(b)(1), unless the authorization is terminated or revoked sooner.  Performed at [facility], [place] [address], [place], [place] [address]   Resp Panel by RT-PCR (Flu A&B, Covid) Nasopharyngeal Swab     Status: None   Collection Time: 08/23/20 12:06 PM   Specimen: Nasopharyngeal Swab; Nasopharyngeal(NP) swabs in vial transport medium  Result Value Ref Range Status   SARS Coronavirus 2 by RT PCR NEGATIVE NEGATIVE Final    Comment: (NOTE) SARS-CoV-2 target nucleic acids are NOT DETECTED.  The SARS-CoV-2 RNA is generally detectable in upper respiratory specimens during the acute phase of infection. The lowest concentration of SARS-CoV-2 viral copies this assay can detect is 138 copies/mL. A negative result does not preclude SARS-Cov-2 infection and should not be used as the sole basis for treatment or other patient management decisions. A negative result may occur with  improper specimen collection/handling, submission of specimen other than nasopharyngeal swab, presence of viral mutation(s) within the areas targeted by this assay, and inadequate number of viral copies(<138 copies/mL). A negative result must be combined  with clinical observations, patient history, and epidemiological information. The expected result is Negative.  Fact Sheet for Patients:  EntrepreneurPulse.com.au  Fact Sheet for Healthcare Providers:  IncredibleEmployment.be  This test is no t yet approved or cleared by the [place] FDA and  has been authorized for detection and/or diagnosis of SARS-CoV-2 by FDA under an Emergency Use Authorization (EUA). This EUA will remain  in effect (meaning this test can be used) for the duration of the COVID-19 declaration under Section 564(b)(1) of the Act, 21 U.S.C.section 360bbb-3(b)(1), unless the authorization is terminated  or revoked sooner.       Influenza A by PCR NEGATIVE NEGATIVE Final   Influenza B by PCR NEGATIVE NEGATIVE Final    Comment: (NOTE) The Xpert Xpress SARS-CoV-2/FLU/RSV plus assay is intended as an aid in the diagnosis of influenza from Nasopharyngeal swab specimens and should not be used as a sole basis for treatment. Nasal washings and aspirates are unacceptable for Xpert Xpress SARS-CoV-2/FLU/RSV testing.  Fact Sheet for Patients: EntrepreneurPulse.com.au  Fact Sheet for Healthcare Providers: IncredibleEmployment.be  This test is not yet approved or cleared by the  [place] [employer] and has been authorized for detection and/or diagnosis of SARS-CoV-2 by FDA under an Print production planner (EUA). This EUA will remain in effect (meaning this test can be used) for the duration of the COVID-19 declaration under Section 564(b)(1) of the Act, 21 U.S.C. section 360bbb-3(b)(1), unless the authorization is terminated or revoked.  Performed at [facility], [place] [address], [place], [place] [address]   Culture, blood (routine x 2)     Status: None   Collection Time: 08/24/20  6:00 PM   Specimen: BLOOD  Result Value Ref Range Status   Specimen Description BLOOD RIGHT  ANTECUBITAL  Final   Special Requests   Final    BOTTLES DRAWN AEROBIC AND ANAEROBIC Blood Culture adequate volume   Culture   Final    NO GROWTH 5 DAYS Performed at [facility], [place] [address], [place], [place] [address]    Report Status 08/29/2020 FINAL  Final  Culture, blood (routine x 2)     Status: Abnormal   Collection Time: 08/24/20  6:05 PM   Specimen: BLOOD  Result Value Ref Range Status   Specimen Description BLOOD BLOOD RIGHT FOREARM  Final   Special Requests   Final    BOTTLES DRAWN AEROBIC AND ANAEROBIC Blood Culture results may not be optimal due to an inadequate volume of blood received in culture bottles   Culture  Setup Time   Final    GRAM POSITIVE COCCI IN CLUSTERS IN BOTH AEROBIC AND ANAEROBIC BOTTLES CRITICAL RESULT CALLED TO, READ BACK BY AND VERIFIED WITH: J. LEDFORD PHARMD, AT 4128 08/25/20 D. VANHOOK    Culture (A)  Final    AEROCOCCUS URINAE THE SIGNIFICANCE OF ISOLATING THIS ORGANISM FROM A SINGLE SET OF BLOOD CULTURES WHEN MULTIPLE SETS ARE DRAWN IS UNCERTAIN. PLEASE NOTIFY THE MICROBIOLOGY DEPARTMENT WITHIN ONE WEEK IF SPECIATION AND SENSITIVITIES ARE REQUIRED. Performed at [facility], [place] [address], [place], [place] [address]    Report Status 08/27/2020 FINAL  Final  Blood Culture ID Panel (Reflexed)     Status: None   Collection Time: 08/24/20  6:05 PM  Result Value Ref Range Status   Enterococcus faecalis NOT DETECTED NOT DETECTED Final   Enterococcus Faecium NOT DETECTED NOT DETECTED Final   Listeria monocytogenes NOT DETECTED NOT DETECTED Final   Staphylococcus species NOT DETECTED NOT DETECTED Final   Staphylococcus aureus (BCID) NOT DETECTED NOT DETECTED Final   Staphylococcus epidermidis NOT DETECTED NOT DETECTED Final   Staphylococcus lugdunensis NOT DETECTED NOT DETECTED Final   Streptococcus species NOT DETECTED NOT DETECTED Final   Streptococcus agalactiae NOT DETECTED NOT DETECTED Final   Streptococcus pneumoniae NOT  DETECTED NOT DETECTED Final   Streptococcus pyogenes NOT DETECTED NOT DETECTED Final   A.calcoaceticus-baumannii NOT DETECTED NOT DETECTED Final   Bacteroides fragilis NOT DETECTED NOT DETECTED Final   Enterobacterales NOT DETECTED NOT DETECTED Final   Enterobacter cloacae complex NOT DETECTED NOT DETECTED Final   Escherichia coli NOT DETECTED NOT DETECTED Final   Klebsiella aerogenes NOT DETECTED NOT DETECTED Final   Klebsiella oxytoca NOT DETECTED NOT DETECTED Final   Klebsiella pneumoniae NOT DETECTED NOT DETECTED Final   Proteus species NOT DETECTED NOT DETECTED Final   Salmonella species NOT DETECTED NOT DETECTED Final   Serratia marcescens NOT DETECTED NOT DETECTED Final   Haemophilus influenzae NOT DETECTED NOT DETECTED Final   Neisseria meningitidis NOT DETECTED NOT DETECTED Final   Pseudomonas aeruginosa NOT DETECTED NOT DETECTED Final  Stenotrophomonas maltophilia NOT DETECTED NOT DETECTED Final   Candida albicans NOT DETECTED NOT DETECTED Final   Candida auris NOT DETECTED NOT DETECTED Final   Candida glabrata NOT DETECTED NOT DETECTED Final   Candida krusei NOT DETECTED NOT DETECTED Final   Candida parapsilosis NOT DETECTED NOT DETECTED Final   Candida tropicalis NOT DETECTED NOT DETECTED Final   Cryptococcus neoformans/gattii NOT DETECTED NOT DETECTED Final    Comment: Performed at [facility], [place] [address], [place], [place] [address]  Resp Panel by RT-PCR (Flu A&B, Covid) Nasopharyngeal Swab     Status: None   Collection Time: 08/30/20 10:10 AM   Specimen: Nasopharyngeal Swab; Nasopharyngeal(NP) swabs in vial transport medium  Result Value Ref Range Status   SARS Coronavirus 2 by RT PCR NEGATIVE NEGATIVE Final    Comment: (NOTE) SARS-CoV-2 target nucleic acids are NOT DETECTED.  The SARS-CoV-2 RNA is generally detectable in upper respiratory specimens during the acute phase of infection. The lowest concentration of SARS-CoV-2 viral copies this assay can  detect is 138 copies/mL. A negative result does not preclude SARS-Cov-2 infection and should not be used as the sole basis for treatment or other patient management decisions. A negative result may occur with  improper specimen collection/handling, submission of specimen other than nasopharyngeal swab, presence of viral mutation(s) within the areas targeted by this assay, and inadequate number of viral copies(<138 copies/mL). A negative result must be combined with clinical observations, patient history, and epidemiological information. The expected result is Negative.  Fact Sheet for Patients:  EntrepreneurPulse.com.au  Fact Sheet for Healthcare Providers:  IncredibleEmployment.be  This test is no t yet approved or cleared by the [place] FDA and  has been authorized for detection and/or diagnosis of SARS-CoV-2 by FDA under an Emergency Use Authorization (EUA). This EUA will remain  in effect (meaning this test can be used) for the duration of the COVID-19 declaration under Section 564(b)(1) of the Act, 21 U.S.C.section 360bbb-3(b)(1), unless the authorization is terminated  or revoked sooner.       Influenza A by PCR NEGATIVE NEGATIVE Final   Influenza B by PCR NEGATIVE NEGATIVE Final    Comment: (NOTE) The Xpert Xpress SARS-CoV-2/FLU/RSV plus assay is intended as an aid in the diagnosis of influenza from Nasopharyngeal swab specimens and should not be used as a sole basis for treatment. Nasal washings and aspirates are unacceptable for Xpert Xpress SARS-CoV-2/FLU/RSV testing.  Fact Sheet for Patients: EntrepreneurPulse.com.au  Fact Sheet for Healthcare Providers: IncredibleEmployment.be  This test is not yet approved or cleared by the [place] FDA and has been authorized for detection and/or diagnosis of SARS-CoV-2 by FDA under an Emergency Use Authorization (EUA). This EUA will remain in  effect (meaning this test can be used) for the duration of the COVID-19 declaration under Section 564(b)(1) of the Act, 21 U.S.C. section 360bbb-3(b)(1), unless the authorization is terminated or revoked.  Performed at [facility], [place] [address] [place], [place] [address]      Time coordinating discharge: Over 30 minutes  SIGNED:   Charlynne Cousins, MD  [facility] 08/30/2020, 2:10 PM Pager   If 7PM-7AM, please contact night-coverage [email or web address] Password [email or web address]

## 2020-08-30 NOTE — Progress Notes (Signed)
[facility] PROGRESS NOTE    Progress Note  Cody Douglas  BRA:[id number] DOB: May 12, 1950 DOA: 08/20/2020 PCP: Tamsen Roers, MD     Brief Narrative:   Cody Douglas is an 70 y.o. male past medical history of non-small cell cancer on chemo and radiation therapy, essential hypertension, also prostate cancer comes in from [facility] for left-sided weakness, he was recently discharged from the hospital 07/15/2020 for acute right-sided CVA with left-sided weakness transferred to skilled rehab.  Who comes back in for new CVA to the right basal ganglia parietal lobe matter right frontal and occipital lobes.  Awaiting LTC with hospice care placement.  Assessment/Plan:   Acute ischemic stroke [facility]): MRI of the brain showed patchy multiple infarcts to the right basal ganglia, parietal white matter right frontal and occipital cortices. 2D echo showed a mobile mass in the aortic area probably not infected bacterial endocarditis. Transition to Lovenox subcutaneously. Patient was made a DNR/DNI he would like to have no further chemotherapy or radiation.   Patient would like to be full comfort care, transfer to 6 N. Awaiting LTC facility with hospice will continue Lovenox twice daily.  He would like no rehab is hospice support. Do not escalate care.  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 Transition to hospice care he will like to continue anticoagulation. Continue subcutaneous Lovenox. Discontinue antibiotics.  Cognitive assessment: Will need evaluation as an outpatient.  Stage III squamous cell lung cancer: Currently on chemo and radiation he was scheduled for treatment on 08/23/2020 which has been postponed. For radiation therapy today.  Essential hypertension/grade 2 diastolic heart failure: He is currently on metoprolol will need to allow  permissive hypertension.  Oral thrush: Likely related to chemotherapy now resolved stop nystatin.  Alcohol abuse: Treated with thiamine and folate.  Goals of care/ethics: Palliative  Care met with patient and family and after explaining his prognosis they decided to move towards full comfort care. He is not a candidate for residential hospice at this point in time, he would likely go to LTC facility with hospice, he does not want any antibiotic and 1 to minimize medications that did not have to do with comfort. Will continue oxycodone and Lovenox.   DVT prophylaxis: lovenox Family Communication:duaghter Status is: Inpatient  Remains inpatient appropriate because:Hemodynamically unstable  Dispo: The patient is from: SNF              Anticipated d/c is to: SNF              Patient currently is not medically stable to d/c.   Difficult to place patient No     Code Status:     Code Status Orders  (From admission, onward)           Start     Ordered   08/20/20 2123  Do not attempt resuscitation (DNR)  Continuous       Question Answer Comment  In the event of cardiac or respiratory ARREST Do not call a "code blue"   In the event of cardiac or respiratory ARREST Do not perform Intubation, CPR, defibrillation or ACLS   In the event of cardiac or respiratory ARREST Use medication by any route, position, wound care, and other measures to relive pain and suffering. May use oxygen, suction and manual treatment of airway obstruction as needed for comfort.   Comments yellow form at bedside  08/20/20 2122           Code Status History     Date Active Date Inactive Code Status Order ID Comments User Context   08/20/2020 2039 08/20/2020 2122 Full Code [id number]  Etta Quill, DO ED   06/30/2020 2219 07/15/2020 1956 Full Code [id number]  Marcelyn Bruins, MD ED   12/13/2013 0223 12/14/2013 1627 Full Code [id number]  Toy Baker, MD Inpatient   12/13/2013 0113  12/13/2013 0223 Full Code [id number]  Toy Baker, MD Inpatient      Advance Directive Documentation    Flowsheet Row Most Recent Value  Type of Advance Directive Out of facility DNR (pink MOST or yellow form)  Pre-existing out of facility DNR order (yellow form or pink MOST form) Pink MOST/Yellow Form most recent copy in chart - Physician notified to receive inpatient order  "MOST" Form in Place? --         IV Access:   Peripheral IV   Procedures and diagnostic studies:   No results found.   Medical Consultants:   None.   Subjective:    Cody Douglas relates his pain is the same as yesterday, tolerating his diet.  Objective:    Vitals:   08/28/20 2015 08/29/20 0718 08/29/20 1937 08/29/20 2343  BP: 118/67 111/72 112/70 108/78  Pulse: 92 90 (!) 101 (!) 108  Resp: _0 Temp: 97.6 F (36.4 C) 98.2 F (36.8 C) 98.4 F (36.9 C) 98.2 F (36.8 C)  TempSrc: Oral Oral Oral Oral  SpO2: 99% 95% 93% 92%  Weight:      Height:       SpO2: 92 % O2 Flow Rate (L/min): 2 L/min  No intake or output data in the 24 hours ending 08/30/20 0921  Filed Weights   08/20/20 1859  Weight: 64.5 kg    Exam: General exam: In no acute distress. Respiratory system: Good air movement and clear to auscultation. Cardiovascular system: S1 & S2 heard, RRR. No JVD.  Extremities: No pedal edema. Skin: No rashes, lesions or ulcers  Data Reviewed:    Labs: Basic Metabolic Panel: Recent Labs  Lab 08/28/20 0706  NA 134*  K 3.9  CL 103  CO2 22  GLUCOSE 109*  BUN 8  CREATININE 0.43*  CALCIUM 8.8*    GFR Estimated Creatinine Clearance: 77.3 mL/min (A) (by C-G formula based on SCr of 0.43 mg/dL (L)). Liver Function Tests: No results for input(s): AST, ALT, ALKPHOS, BILITOT, PROT, ALBUMIN in the last 168 hours.  No results for input(s): LIPASE, AMYLASE in the last 168 hours. No results for input(s): AMMONIA in the last 168 hours. Coagulation profile No  results for input(s): INR, PROTIME in the last 168 hours.  COVID-19 Labs  No results for input(s): DDIMER, FERRITIN, LDH, CRP in the last 72 hours.  Lab Results  Component Value Date   SARSCOV2NAA NEGATIVE 08/23/2020   SARSCOV2NAA NEGATIVE 08/20/2020   SARSCOV2NAA NEGATIVE 07/15/2020   [place] NEGATIVE 07/12/2020    CBC: Recent Labs  Lab 08/25/20 0324 08/26/20 0443 08/27/20 0415 08/28/20 0706  WBC 4.6 4.4 5.0 5.8  HGB 10.8* 11.3* 10.7* 10.9*  HCT 33.2* 35.3* 34.0* 33.5*  MCV 92.0 91.7 91.9 89.6  PLT 283 246 240 230    Cardiac Enzymes: No results for input(s): CKTOTAL, CKMB, CKMBINDEX, TROPONINI in the last 168 hours. BNP (last 3 results) No results for input(s): PROBNP in the last 8760 hours. CBG: Recent Labs  Lab 08/23/20 2001  GLUCAP 113*    D-Dimer: No results for input(s): DDIMER in the last 72 hours. Hgb A1c: No results for input(s): HGBA1C in the last 72 hours. Lipid Profile: No results for input(s): CHOL, HDL, LDLCALC, TRIG, CHOLHDL, LDLDIRECT in the last 72 hours. Thyroid function studies: No results for input(s): TSH, T4TOTAL, T3FREE, THYROIDAB in the last 72 hours.  Invalid input(s): FREET3 Anemia work up: No results for input(s): VITAMINB12, FOLATE, FERRITIN, TIBC, IRON, RETICCTPCT in the last 72 hours. Sepsis Labs: Recent Labs  Lab 08/25/20 0324 08/26/20 0443 08/27/20 0415 08/28/20 0706  WBC 4.6 4.4 5.0 5.8    Microbiology Recent Results (from the past 240 hour(s))  SARS CORONAVIRUS 2 (TAT 6-24 HRS) Nasopharyngeal Nasopharyngeal Swab     Status: None   Collection Time: 08/20/20  9:36 PM   Specimen: Nasopharyngeal Swab  Result Value Ref Range Status   SARS Coronavirus 2 NEGATIVE NEGATIVE Final    Comment: (NOTE) SARS-CoV-2 target nucleic acids are NOT DETECTED.  The SARS-CoV-2 RNA is generally detectable in upper and lower respiratory specimens during the acute phase of infection. Negative results do not preclude SARS-CoV-2  infection, do not rule out co-infections with other pathogens, and should not be used as the sole basis for treatment or other patient management decisions. Negative results must be combined with clinical observations, patient history, and epidemiological information. The expected result is Negative.  Fact Sheet for Patients: SugarRoll.be  Fact Sheet for Healthcare Providers: https://www.woods-mathews.com/  This test is not yet approved or cleared by the [place] FDA and  has been authorized for detection and/or diagnosis of SARS-CoV-2 by FDA under an Emergency Use Authorization (EUA). This EUA will remain  in effect (meaning this test can be used) for the duration of the COVID-19 declaration under Se ction 564(b)(1) of the Act, 21 U.S.C. section 360bbb-3(b)(1), unless the authorization is terminated or revoked sooner.  Performed at [facility], [place] [address] [place], [place] [address]   Resp Panel by RT-PCR (Flu A&B, Covid) Nasopharyngeal Swab     Status: None   Collection Time: 08/23/20 12:06 PM   Specimen: Nasopharyngeal Swab; Nasopharyngeal(NP) swabs in vial transport medium  Result Value Ref Range Status   SARS Coronavirus 2 by RT PCR NEGATIVE NEGATIVE Final    Comment: (NOTE) SARS-CoV-2 target nucleic acids are NOT DETECTED.  The SARS-CoV-2 RNA is generally detectable in upper respiratory specimens during the acute phase of infection. The lowest concentration of SARS-CoV-2 viral copies this assay can detect is 138 copies/mL. A negative result does not preclude SARS-Cov-2 infection and should not be used as the sole basis for treatment or other patient management decisions. A negative result may occur with  improper specimen collection/handling, submission of specimen other than nasopharyngeal swab, presence of viral mutation(s) within the areas targeted by this assay, and inadequate number of viral copies(<138  copies/mL). A negative result must be combined with clinical observations, patient history, and epidemiological information. The expected result is Negative.  Fact Sheet for Patients:  EntrepreneurPulse.com.au  Fact Sheet for Healthcare Providers:  IncredibleEmployment.be  This test is no t yet approved or cleared by the [place] FDA and  has been authorized for detection and/or diagnosis of SARS-CoV-2 by FDA under an Emergency Use Authorization (EUA). This EUA will remain  in effect (meaning this test can be used) for the duration of the COVID-19 declaration under Section 564(b)(1) of the Act, 21 U.S.C.section 360bbb-3(b)(1), unless the authorization is terminated  or revoked sooner.  Influenza A by PCR NEGATIVE NEGATIVE Final   Influenza B by PCR NEGATIVE NEGATIVE Final    Comment: (NOTE) The Xpert Xpress SARS-CoV-2/FLU/RSV plus assay is intended as an aid in the diagnosis of influenza from Nasopharyngeal swab specimens and should not be used as a sole basis for treatment. Nasal washings and aspirates are unacceptable for Xpert Xpress SARS-CoV-2/FLU/RSV testing.  Fact Sheet for Patients: EntrepreneurPulse.com.au  Fact Sheet for Healthcare Providers: IncredibleEmployment.be  This test is not yet approved or cleared by the [place] FDA and has been authorized for detection and/or diagnosis of SARS-CoV-2 by FDA under an Emergency Use Authorization (EUA). This EUA will remain in effect (meaning this test can be used) for the duration of the COVID-19 declaration under Section 564(b)(1) of the Act, 21 U.S.C. section 360bbb-3(b)(1), unless the authorization is terminated or revoked.  Performed at [facility], [place] [address] [place], [place] [address]   Culture, blood (routine x 2)     Status: None   Collection Time: 08/24/20  6:00 PM   Specimen: BLOOD  Result Value Ref Range Status    Specimen Description BLOOD RIGHT ANTECUBITAL  Final   Special Requests   Final    BOTTLES DRAWN AEROBIC AND ANAEROBIC Blood Culture adequate volume   Culture   Final    NO GROWTH 5 DAYS Performed at [facility], [place] [address], [place], [place] [address]    Report Status 08/29/2020 FINAL  Final  Culture, blood (routine x 2)     Status: Abnormal   Collection Time: 08/24/20  6:05 PM   Specimen: BLOOD  Result Value Ref Range Status   Specimen Description BLOOD BLOOD RIGHT FOREARM  Final   Special Requests   Final    BOTTLES DRAWN AEROBIC AND ANAEROBIC Blood Culture results may not be optimal due to an inadequate volume of blood received in culture bottles   Culture  Setup Time   Final    GRAM POSITIVE COCCI IN CLUSTERS IN BOTH AEROBIC AND ANAEROBIC BOTTLES CRITICAL RESULT CALLED TO, READ BACK BY AND VERIFIED WITH: J. LEDFORD PHARMD, AT 3710 08/25/20 D. VANHOOK    Culture (A)  Final    AEROCOCCUS URINAE THE SIGNIFICANCE OF ISOLATING THIS ORGANISM FROM A SINGLE SET OF BLOOD CULTURES WHEN MULTIPLE SETS ARE DRAWN IS UNCERTAIN. PLEASE NOTIFY THE MICROBIOLOGY DEPARTMENT WITHIN ONE WEEK IF SPECIATION AND SENSITIVITIES ARE REQUIRED. Performed at [facility], [place] [address] [place], [place] [address]    Report Status 08/27/2020 FINAL  Final  Blood Culture ID Panel (Reflexed)     Status: None   Collection Time: 08/24/20  6:05 PM  Result Value Ref Range Status   Enterococcus faecalis NOT DETECTED NOT DETECTED Final   Enterococcus Faecium NOT DETECTED NOT DETECTED Final   Listeria monocytogenes NOT DETECTED NOT DETECTED Final   Staphylococcus species NOT DETECTED NOT DETECTED Final   Staphylococcus aureus (BCID) NOT DETECTED NOT DETECTED Final   Staphylococcus epidermidis NOT DETECTED NOT DETECTED Final   Staphylococcus lugdunensis NOT DETECTED NOT DETECTED Final   Streptococcus species NOT DETECTED NOT DETECTED Final   Streptococcus agalactiae NOT DETECTED NOT DETECTED Final    Streptococcus pneumoniae NOT DETECTED NOT DETECTED Final   Streptococcus pyogenes NOT DETECTED NOT DETECTED Final   A.calcoaceticus-baumannii NOT DETECTED NOT DETECTED Final   Bacteroides fragilis NOT DETECTED NOT DETECTED Final   Enterobacterales NOT DETECTED NOT DETECTED Final   Enterobacter cloacae complex NOT DETECTED NOT DETECTED Final   Escherichia  coli NOT DETECTED NOT DETECTED Final   Klebsiella aerogenes NOT DETECTED NOT DETECTED Final   Klebsiella oxytoca NOT DETECTED NOT DETECTED Final   Klebsiella pneumoniae NOT DETECTED NOT DETECTED Final   Proteus species NOT DETECTED NOT DETECTED Final   Salmonella species NOT DETECTED NOT DETECTED Final   Serratia marcescens NOT DETECTED NOT DETECTED Final   Haemophilus influenzae NOT DETECTED NOT DETECTED Final   Neisseria meningitidis NOT DETECTED NOT DETECTED Final   Pseudomonas aeruginosa NOT DETECTED NOT DETECTED Final   Stenotrophomonas maltophilia NOT DETECTED NOT DETECTED Final   Candida albicans NOT DETECTED NOT DETECTED Final   Candida auris NOT DETECTED NOT DETECTED Final   Candida glabrata NOT DETECTED NOT DETECTED Final   Candida krusei NOT DETECTED NOT DETECTED Final   Candida parapsilosis NOT DETECTED NOT DETECTED Final   Candida tropicalis NOT DETECTED NOT DETECTED Final   Cryptococcus neoformans/gattii NOT DETECTED NOT DETECTED Final    Comment: Performed at [facility], [place] [address], [place], [place] [address]     Medications:    ALPRAZolam  0.5 mg Oral TID   cyclobenzaprine  5 mg Oral Q8H   enoxaparin (LOVENOX) injection  60 mg Subcutaneous Q12H   lidocaine  1 patch Transdermal QHS   oxyCODONE-acetaminophen  1 tablet Oral Q8H   polyethylene glycol  17 g Oral Daily   senna  1 tablet Oral QHS   sodium chloride flush  3 mL Intravenous Q12H   traZODone  50 mg Oral QHS   Continuous Infusions:  sodium chloride        LOS: 10 days   Charlynne Cousins  [facility]  08/30/2020, 9:21 AM

## 2020-08-30 NOTE — Telephone Encounter (Signed)
R/s appt per 6/20 sch msg. Called pt, no answer and vm is not set up. Will call again later today.

## 2020-08-31 ENCOUNTER — Ambulatory Visit: Payer: Medicare Other | Admitting: Internal Medicine

## 2020-08-31 ENCOUNTER — Other Ambulatory Visit: Payer: Medicare Other

## 2020-08-31 ENCOUNTER — Ambulatory Visit: Payer: Medicare Other

## 2020-09-01 ENCOUNTER — Ambulatory Visit: Payer: Medicare Other

## 2020-09-02 ENCOUNTER — Encounter: Payer: Self-pay | Admitting: Adult Health

## 2020-09-02 ENCOUNTER — Ambulatory Visit: Payer: Medicare Other

## 2020-09-02 ENCOUNTER — Inpatient Hospital Stay: Payer: Medicare Other | Admitting: Adult Health

## 2020-09-02 NOTE — Progress Notes (Deleted)
[facility] [address] [place]. [place] [address] [phone]       HOSPITAL FOLLOW UP NOTE  [name] Date of Birth:  [birth year] Medical Record Number:  [id number]   Reason for Referral:  hospital stroke follow up    SUBJECTIVE:   CHIEF COMPLAINT:  No chief complaint on file.   HPI:   [name] is a 70 y.o. male with history of HTN, prostate cancer, stage 3 lung cancer on chemo and radiation, tobacco and alcohol use who presented to [facility] ED on 06/30/2020 with left facial droop and slurred speech.  Personally reviewed hospitalization pertinent progress notes, lab work and imaging summary provided.  Evaluated by Dr. Erlinda Hong with stroke work-up revealing right BG infarct secondary to high-grade stenosis of right M1 likely due to large vessel disease given uncontrolled risk factors.  Recommended DAPT for 3 months then Plavix alone.  HTN stable on low side with long-term BP goal 130-150 given high-grade right MCA stenosis.  LDL 115 and initiate atorvastatin 40 mg daily.  EtOH and tobacco use counseling cessation provided.  Other stroke risk factors include advanced age and prior strokes on imaging.  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 Due to hemoptysis, initial DAPT recommendations held and cleared to resume aspirin 325 mg daily however new onset hematuria on 5/2 therefore aspirin dose was reduced to 81 mg daily with resolution of hematuria.  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  Evaluated by therapies who recommended discharge to SNF for ongoing therapy  needs and discharged on 07/15/2020.  He returned on 08/20/2020 with left-sided weakness, left facial droop and dysarthria with stroke work-up revealing acute right MCA scattered infarcts involving right BG, periatrial white matter, right frontal and occipital cortices in setting of severe distal right M1 stenosis.  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      ROS:   14 system review of systems performed and negative with exception of ***  PMH:  Past Medical History:  Diagnosis Date   Anxiety    Dyspnea    with exertion - has inhaler   Frequent headaches    GERD (gastroesophageal reflux disease)    Hypertension    Prostate cancer ([place])    Stroke [facility])    Patient reports having a stroke on 4/13    PSH:  Past Surgical History:  Procedure Laterality Date   BRONCHIAL BRUSHINGS  07/09/2020   Procedure: BRONCHIAL BRUSHINGS;  Surgeon: Garner Nash, DO;  Location: [place] ENDOSCOPY;  Service: Pulmonary;;   BRONCHIAL WASHINGS  07/09/2020   Procedure: BRONCHIAL WASHINGS;  Surgeon: Garner Nash, DO;  Location: [place];  Service: Pulmonary;;   BUBBLE STUDY N/A 08/24/2020   Procedure: BUBBLE STUDY;  Surgeon: Thayer Headings, MD;  Location: [facility] ENDOSCOPY;  Service: Cardiovascular;  Laterality: N/A;   CRYOTHERAPY  07/09/2020   Procedure: CRYOTHERAPY;  Surgeon: Garner Nash, DO;  Location: [place] ENDOSCOPY;  Service: Pulmonary;;   EYE SURGERY  HEMOSTASIS CONTROL  07/09/2020   Procedure: HEMOSTASIS CONTROL;  Surgeon: Garner Nash, DO;  Location: [place] ENDOSCOPY;  Service: Pulmonary;;   PROSTATE BIOPSY     Prostate surgery 8-9 yrs ago   TEE WITHOUT CARDIOVERSION N/A 07/12/2020   Procedure: TRANSESOPHAGEAL ECHOCARDIOGRAM (TEE);  Surgeon: Lelon Perla, MD;  Location: [facility] ENDOSCOPY;  Service:  Cardiovascular;  Laterality: N/A;   TEE WITHOUT CARDIOVERSION N/A 08/24/2020   Procedure: TRANSESOPHAGEAL ECHOCARDIOGRAM (TEE);  Surgeon: Acie Fredrickson Wonda Cheng, MD;  Location: [place];  Service: Cardiovascular;  Laterality: N/A;   VIDEO BRONCHOSCOPY WITH ENDOBRONCHIAL ULTRASOUND Bilateral 07/09/2020   Procedure: VIDEO BRONCHOSCOPY WITH ENDOBRONCHIAL ULTRASOUND;  Surgeon: Garner Nash, DO;  Location: [place];  Service: Pulmonary;  Laterality: Bilateral;    Social History:  Social History   Socioeconomic History   Marital status: Widowed    Spouse name: Not on file   Number of children: Not on file   Years of education: Not on file   Highest education level: Not on file  Occupational History   Not on file  Tobacco Use   Smoking status: Every Day    Packs/day: 2.50    Years: 40.00    Pack years: 100.00    Types: Cigarettes   Smokeless tobacco: Never  Vaping Use   Vaping Use: Never used  Substance and Sexual Activity   Alcohol use: Yes    Alcohol/week: 1.0 standard drink    Types: 1 Cans of beer per week    Comment: couple of days a week   Drug use: No   Sexual activity: Not Currently  Other Topics Concern   Not on file  Social History Narrative   Not on file   Social Determinants of Health   Financial Resource Strain: Not on file  Food Insecurity: Not on file  Transportation Needs: Not on file  Physical Activity: Not on file  Stress: Not on file  Social Connections: Not on file  Intimate Partner Violence: Not on file    Family History:  Family History  Problem Relation Age of Onset   Cancer - Lung Father    Heart disease Brother    Diabetes type II Neg Hx    Stroke Neg Hx     Medications:   Current Outpatient Medications on File Prior to Visit  Medication Sig Dispense Refill   ALPRAZolam (XANAX) 0.5 MG tablet Take 1 tablet (0.5 mg total) by mouth 3 (three) times daily. 10 tablet 0   bisacodyl (DULCOLAX) 5 MG EC tablet Take 1 tablet (5 mg total) by  mouth daily as needed for mild constipation. 30 tablet 0   cyclobenzaprine (FLEXERIL) 10 MG tablet Take 0.5 tablets (5 mg total) by mouth every 8 (eight) hours. 30 tablet 0   enoxaparin (LOVENOX) 60 MG/0.6ML injection Inject 0.6 mLs (60 mg total) into the skin every 12 (twelve) hours. 3 mL 0   glycopyrrolate (ROBINUL) 1 MG tablet Take 1 tablet (1 mg total) by mouth 3 (three) times daily. 30 tablet 0   oxyCODONE 10 MG TABS Take 1 tablet (10 mg total) by mouth every 2 (two) hours as needed for moderate pain. 10 tablet 0   polyethylene glycol (MIRALAX / GLYCOLAX) 17 g packet Take 17 g by mouth daily. 14 each 0   traZODone (DESYREL) 50 MG tablet Take 1 tablet (50 mg total) by mouth at bedtime. 3 tablet 0   No current facility-administered medications on file prior to visit.    Allergies:  No  Known Allergies    OBJECTIVE:  Physical Exam  There were no vitals filed for this visit. There is no height or weight on file to calculate BMI. No results found.  No flowsheet data found.   General: well developed, well nourished, seated, in no evident distress Head: head normocephalic and atraumatic.   Neck: supple with no carotid or supraclavicular bruits Cardiovascular: regular rate and rhythm, no murmurs Musculoskeletal: no deformity Skin:  no rash/petichiae Vascular:  Normal pulses all extremities   Neurologic Exam Mental Status: Awake and fully alert. Oriented to place and time. Recent and remote memory intact. Attention span, concentration and fund of knowledge appropriate. Mood and affect appropriate.  Cranial Nerves: Fundoscopic exam reveals sharp disc margins. Pupils equal, briskly reactive to light. Extraocular movements full without nystagmus. Visual fields full to confrontation. Hearing intact. Facial sensation intact. Face, tongue, palate moves normally and symmetrically.  Motor: Normal bulk and tone. Normal strength in all tested extremity muscles Sensory.: intact to touch ,  pinprick , position and vibratory sensation.  Coordination: Rapid alternating movements normal in all extremities. Finger-to-nose and heel-to-shin performed accurately bilaterally. Gait and Station: Arises from chair without difficulty. Stance is normal. Gait demonstrates normal stride length and balance with ***. Tandem walk and heel toe ***.  Reflexes: 1+ and symmetric. Toes downgoing.     NIHSS   Modified Rankin        ASSESSMENT: BERL BONFANTI is a 70 y.o. year old male presented with *** on *** secondary to ***. Vascular risk factors include ***.      PLAN:  : Residual deficit: ***. Continue {anticoagulants:31417}  and ***  for secondary stroke prevention.  Discussed secondary stroke prevention measures and importance of close PCP follow up for aggressive stroke risk factor management  HTN: BP goal <130/90.  Stable on *** per PCP HLD: LDL goal <70. Recent LDL ***.  DMII: A1c goal<7.0. Recent A1c ***.     Follow up in *** or call earlier if needed   CC:  [facility] provider: Dr. Leonie Man PCP: Tamsen Roers, MD    I spent *** minutes of face-to-face and non-face-to-face time with patient.  This included previsit chart review, lab review, study review, order entry, electronic health record documentation, patient education regarding recent stroke, residual deficits, importance of managing stroke risk factors and answered all questions to patient satisfaction     Frann Rider, [facility]  [facility] [address] [place], [place] [address]  Phone [phone] Fax [phone] Note: This document was prepared with digital dictation and possible smart phrase technology. Any transcriptional errors that result from this process are unintentional.

## 2020-09-03 ENCOUNTER — Ambulatory Visit: Payer: Medicare Other

## 2020-09-03 MED FILL — Dexamethasone Sodium Phosphate Inj 100 MG/10ML: INTRAMUSCULAR | Qty: 2 | Status: AC

## 2020-09-06 ENCOUNTER — Other Ambulatory Visit: Payer: Medicare Other

## 2020-09-06 ENCOUNTER — Telehealth: Payer: Self-pay | Admitting: Medical Oncology

## 2020-09-06 ENCOUNTER — Ambulatory Visit: Payer: Medicare Other

## 2020-09-06 NOTE — Telephone Encounter (Signed)
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

## 2020-09-06 NOTE — Telephone Encounter (Addendum)
Pt did not show up for his appt -His VM is not set up. I lvm on dtrs phone. All appts cancelled.

## 2020-09-07 ENCOUNTER — Ambulatory Visit: Payer: Medicare Other

## 2020-09-08 ENCOUNTER — Encounter: Payer: Self-pay | Admitting: Internal Medicine

## 2020-09-08 ENCOUNTER — Ambulatory Visit: Payer: Medicare Other

## 2020-09-08 NOTE — Progress Notes (Signed)
  Radiation Oncology         [phone] ________________________________  Name: Cody Douglas MRN: [id number]  Date: 08/30/2020  DOB: [birth year]  End of Treatment Note  Diagnosis:    Stage IIIA, cT3N1M0, NSCLC, Squamous Cell Carcinoma of the RUL     Indication for treatment: curative    Radiation treatment dates:   08/03/20-08/27/20  Site/planned dose:   The right lung was treated to 34 Gy of the anticipated 66 Gy prescribed. He received this over 17 fractions.  Narrative: The patient's status declined after progressive CVA changes during his course of radiation, and her radiotherapy was discontinued after 17 treatments.  Plan: The patient will discharge to hospice facility at time of hospital discharge. ________________________________     Carola Rhine, PAC

## 2020-09-09 ENCOUNTER — Ambulatory Visit: Payer: Medicare Other

## 2020-09-10 ENCOUNTER — Ambulatory Visit: Payer: Medicare Other

## 2020-09-14 ENCOUNTER — Ambulatory Visit: Payer: Medicare Other

## 2020-09-14 ENCOUNTER — Encounter: Payer: Medicare Other | Admitting: Dietician

## 2020-09-14 ENCOUNTER — Ambulatory Visit: Payer: Medicare Other | Admitting: Internal Medicine

## 2020-09-14 ENCOUNTER — Other Ambulatory Visit: Payer: Medicare Other

## 2020-09-15 ENCOUNTER — Ambulatory Visit: Payer: Medicare Other

## 2020-09-16 ENCOUNTER — Ambulatory Visit: Payer: Medicare Other

## 2020-09-17 ENCOUNTER — Ambulatory Visit: Payer: Medicare Other

## 2020-09-20 ENCOUNTER — Ambulatory Visit: Payer: Medicare Other

## 2020-09-21 ENCOUNTER — Ambulatory Visit: Payer: Medicare Other

## 2020-09-22 ENCOUNTER — Ambulatory Visit: Payer: Medicare Other

## 2020-10-01 ENCOUNTER — Encounter: Payer: Self-pay | Admitting: Cardiology

## 2020-10-14 ENCOUNTER — Ambulatory Visit: Payer: Medicare Other | Admitting: Cardiology

## 2021-06-11 DEATH — deceased
